# Patient Record
Sex: Male | Born: 1970 | Race: Black or African American | Hispanic: No | Marital: Married | Smoking: Never smoker
Health system: Southern US, Community
[De-identification: ages and names within clinical notes are randomized; demographics above are authoritative.]

## PROBLEM LIST (undated history)

## (undated) DIAGNOSIS — I10 Essential (primary) hypertension: Secondary | ICD-10-CM

## (undated) DIAGNOSIS — N183 Chronic kidney disease, stage 3 unspecified: Secondary | ICD-10-CM

## (undated) DIAGNOSIS — J45909 Unspecified asthma, uncomplicated: Secondary | ICD-10-CM

## (undated) DIAGNOSIS — E1129 Type 2 diabetes mellitus with other diabetic kidney complication: Secondary | ICD-10-CM

## (undated) HISTORY — PX: CATARACT EXTRACTION: SUR2

---

## 2020-01-08 ENCOUNTER — Ambulatory Visit: Payer: Self-pay | Attending: Internal Medicine

## 2020-01-08 DIAGNOSIS — Z23 Encounter for immunization: Secondary | ICD-10-CM

## 2020-01-08 NOTE — Progress Notes (Signed)
   Covid-19 Vaccination [facility]  Name:  Clayton [name]    MRN: [id number] DOB: [birth year]  01/08/2020  [name] was observed post Covid-19 immunization for 15 minutes without incident. He was provided with Vaccine Information Sheet and instruction to access the V-Safe system.   [name] was instructed to call 911 with any severe reactions post vaccine: Marland Kitchen Difficulty breathing  . Swelling of face and throat  . A fast heartbeat  . A bad rash all over body  . Dizziness and weakness   Immunizations Administered    Name Date Dose VIS Date Route   Pfizer COVID-19 Vaccine 01/08/2020 12:26 PM 0.3 mL 09/29/2019 Intramuscular   Manufacturer: [employer], [employer]   Lot: [id number]   NDC: [address]-0

## 2020-01-29 ENCOUNTER — Ambulatory Visit: Payer: Self-pay | Attending: Internal Medicine

## 2020-01-29 DIAGNOSIS — Z23 Encounter for immunization: Secondary | ICD-10-CM

## 2020-01-29 NOTE — Progress Notes (Signed)
   Covid-19 Vaccination Clinic  Name:  Clayton [name]    MRN: [id number] DOB: Jun 10, 1971  01/29/2020  Mr. Clayton [name] was observed post Covid-19 immunization for 15 minutes without incident. He was provided with Vaccine Information Sheet and instruction to access the V-Safe system.   Mr. Clayton [name] was instructed to call 911 with any severe reactions post vaccine: Marland Kitchen Difficulty breathing  . Swelling of face and throat  . A fast heartbeat  . A bad rash all over body  . Dizziness and weakness   Immunizations Administered    Name Date Dose VIS Date Route   Pfizer COVID-19 Vaccine 01/29/2020 11:38 AM 0.3 mL 09/29/2019 Intramuscular   Manufacturer: [employer], [employer]   Lot: [id number]   NDC: [address]-2

## 2021-04-17 ENCOUNTER — Inpatient Hospital Stay
Admission: EM | Admit: 2021-04-17 | Discharge: 2021-04-25 | DRG: 305 | Disposition: A | Discharge status: Home / Self Care | Payer: 59 | Attending: Hospitalist | Admitting: Hospitalist

## 2021-04-17 ENCOUNTER — Inpatient Hospital Stay: Payer: 59

## 2021-04-17 ENCOUNTER — Emergency Department: Payer: 59

## 2021-04-17 ENCOUNTER — Other Ambulatory Visit: Payer: Self-pay

## 2021-04-17 DIAGNOSIS — B9689 Other specified bacterial agents as the cause of diseases classified elsewhere: Secondary | ICD-10-CM | POA: Diagnosis present

## 2021-04-17 DIAGNOSIS — E871 Hypo-osmolality and hyponatremia: Secondary | ICD-10-CM | POA: Diagnosis present

## 2021-04-17 DIAGNOSIS — R569 Unspecified convulsions: Secondary | ICD-10-CM | POA: Diagnosis not present

## 2021-04-17 DIAGNOSIS — R29810 Facial weakness: Secondary | ICD-10-CM | POA: Diagnosis present

## 2021-04-17 DIAGNOSIS — M542 Cervicalgia: Secondary | ICD-10-CM | POA: Diagnosis present

## 2021-04-17 DIAGNOSIS — N183 Chronic kidney disease, stage 3 unspecified: Secondary | ICD-10-CM | POA: Diagnosis present

## 2021-04-17 DIAGNOSIS — Z823 Family history of stroke: Secondary | ICD-10-CM

## 2021-04-17 DIAGNOSIS — R7881 Bacteremia: Secondary | ICD-10-CM | POA: Diagnosis present

## 2021-04-17 DIAGNOSIS — Z781 Physical restraint status: Secondary | ICD-10-CM

## 2021-04-17 DIAGNOSIS — G9341 Metabolic encephalopathy: Secondary | ICD-10-CM | POA: Diagnosis present

## 2021-04-17 DIAGNOSIS — I248 Other forms of acute ischemic heart disease: Secondary | ICD-10-CM | POA: Diagnosis present

## 2021-04-17 DIAGNOSIS — Z20822 Contact with and (suspected) exposure to covid-19: Secondary | ICD-10-CM | POA: Diagnosis present

## 2021-04-17 DIAGNOSIS — E872 Acidosis: Secondary | ICD-10-CM | POA: Diagnosis present

## 2021-04-17 DIAGNOSIS — Z8249 Family history of ischemic heart disease and other diseases of the circulatory system: Secondary | ICD-10-CM | POA: Diagnosis not present

## 2021-04-17 DIAGNOSIS — R338 Other retention of urine: Secondary | ICD-10-CM

## 2021-04-17 DIAGNOSIS — Z7984 Long term (current) use of oral hypoglycemic drugs: Secondary | ICD-10-CM | POA: Diagnosis not present

## 2021-04-17 DIAGNOSIS — I129 Hypertensive chronic kidney disease with stage 1 through stage 4 chronic kidney disease, or unspecified chronic kidney disease: Secondary | ICD-10-CM | POA: Diagnosis present

## 2021-04-17 DIAGNOSIS — D72829 Elevated white blood cell count, unspecified: Secondary | ICD-10-CM

## 2021-04-17 DIAGNOSIS — R1110 Vomiting, unspecified: Secondary | ICD-10-CM

## 2021-04-17 DIAGNOSIS — I161 Hypertensive emergency: Secondary | ICD-10-CM | POA: Diagnosis present

## 2021-04-17 DIAGNOSIS — R7989 Other specified abnormal findings of blood chemistry: Secondary | ICD-10-CM | POA: Diagnosis present

## 2021-04-17 DIAGNOSIS — I674 Hypertensive encephalopathy: Secondary | ICD-10-CM | POA: Diagnosis present

## 2021-04-17 DIAGNOSIS — E1165 Type 2 diabetes mellitus with hyperglycemia: Secondary | ICD-10-CM | POA: Diagnosis present

## 2021-04-17 DIAGNOSIS — G253 Myoclonus: Secondary | ICD-10-CM | POA: Diagnosis not present

## 2021-04-17 DIAGNOSIS — Z79899 Other long term (current) drug therapy: Secondary | ICD-10-CM

## 2021-04-17 DIAGNOSIS — T383X5A Adverse effect of insulin and oral hypoglycemic [antidiabetic] drugs, initial encounter: Secondary | ICD-10-CM | POA: Diagnosis present

## 2021-04-17 DIAGNOSIS — Z794 Long term (current) use of insulin: Secondary | ICD-10-CM

## 2021-04-17 DIAGNOSIS — E1129 Type 2 diabetes mellitus with other diabetic kidney complication: Secondary | ICD-10-CM | POA: Diagnosis present

## 2021-04-17 DIAGNOSIS — N179 Acute kidney failure, unspecified: Secondary | ICD-10-CM | POA: Diagnosis present

## 2021-04-17 DIAGNOSIS — R339 Retention of urine, unspecified: Secondary | ICD-10-CM | POA: Diagnosis present

## 2021-04-17 DIAGNOSIS — R778 Other specified abnormalities of plasma proteins: Secondary | ICD-10-CM | POA: Diagnosis present

## 2021-04-17 DIAGNOSIS — I10 Essential (primary) hypertension: Secondary | ICD-10-CM | POA: Diagnosis not present

## 2021-04-17 DIAGNOSIS — R112 Nausea with vomiting, unspecified: Secondary | ICD-10-CM

## 2021-04-17 DIAGNOSIS — E1122 Type 2 diabetes mellitus with diabetic chronic kidney disease: Secondary | ICD-10-CM | POA: Diagnosis present

## 2021-04-17 DIAGNOSIS — T465X6A Underdosing of other antihypertensive drugs, initial encounter: Secondary | ICD-10-CM | POA: Diagnosis present

## 2021-04-17 DIAGNOSIS — N1832 Chronic kidney disease, stage 3b: Secondary | ICD-10-CM | POA: Diagnosis present

## 2021-04-17 DIAGNOSIS — I160 Hypertensive urgency: Secondary | ICD-10-CM | POA: Insufficient documentation

## 2021-04-17 DIAGNOSIS — Z9114 Patient's other noncompliance with medication regimen: Secondary | ICD-10-CM | POA: Diagnosis not present

## 2021-04-17 DIAGNOSIS — B957 Other staphylococcus as the cause of diseases classified elsewhere: Secondary | ICD-10-CM | POA: Diagnosis present

## 2021-04-17 DIAGNOSIS — G039 Meningitis, unspecified: Secondary | ICD-10-CM

## 2021-04-17 HISTORY — DX: Essential (primary) hypertension: I10

## 2021-04-17 HISTORY — DX: Type 2 diabetes mellitus with other diabetic kidney complication: E11.29

## 2021-04-17 HISTORY — DX: Chronic kidney disease, stage 3 unspecified: N18.30

## 2021-04-17 LAB — APTT: aPTT: 24 seconds (ref 24–36)

## 2021-04-17 LAB — BASIC METABOLIC PANEL
Anion gap: 16 — ABNORMAL HIGH (ref 5–15)
BUN: 30 mg/dL — ABNORMAL HIGH (ref 6–20)
CO2: 28 mmol/L (ref 22–32)
Calcium: 9.8 mg/dL (ref 8.9–10.3)
Chloride: 88 mmol/L — ABNORMAL LOW (ref 98–111)
Creatinine, Ser: 2.37 mg/dL — ABNORMAL HIGH (ref 0.61–1.24)
GFR, Estimated: 33 mL/min — ABNORMAL LOW (ref >60)
Glucose, Bld: 441 mg/dL — ABNORMAL HIGH (ref 70–99)
Potassium: 4.3 mmol/L (ref 3.5–5.1)
Sodium: 132 mmol/L — ABNORMAL LOW (ref 135–145)

## 2021-04-17 LAB — BLOOD GAS, VENOUS
Acid-base deficit: 2.6 mmol/L — ABNORMAL HIGH (ref 0.0–2.0)
Bicarbonate: 23.2 mmol/L (ref 20.0–28.0)
O2 Saturation: 66.5 %
Patient temperature: 37
pCO2, Ven: 43 mmHg — ABNORMAL LOW (ref 44.0–60.0)
pH, Ven: 7.34 (ref 7.250–7.430)
pO2, Ven: 37 mmHg (ref 32.0–45.0)

## 2021-04-17 LAB — URINALYSIS, COMPLETE (UACMP) WITH MICROSCOPIC
Bacteria, UA: NONE SEEN
Bilirubin Urine: NEGATIVE
Glucose, UA: >=500 mg/dL — AB
Hgb urine dipstick: NEGATIVE
Ketones, ur: 20 mg/dL — AB
Leukocytes,Ua: NEGATIVE
Nitrite: NEGATIVE
Protein, ur: 100 mg/dL — AB
Specific Gravity, Urine: 1.025 (ref 1.005–1.030)
pH: 5 (ref 5.0–8.0)

## 2021-04-17 LAB — RESP PANEL BY RT-PCR (FLU A&B, COVID) ARPGX2
Influenza A by PCR: NEGATIVE
Influenza B by PCR: NEGATIVE
SARS Coronavirus 2 by RT PCR: NEGATIVE

## 2021-04-17 LAB — CBC
HCT: 46 % (ref 39.0–52.0)
Hemoglobin: 16.1 g/dL (ref 13.0–17.0)
MCH: 31.4 pg (ref 26.0–34.0)
MCHC: 35 g/dL (ref 30.0–36.0)
MCV: 89.7 fL (ref 80.0–100.0)
Platelets: 252 10*3/uL (ref 150–400)
RBC: 5.13 MIL/uL (ref 4.22–5.81)
RDW: 11.8 % (ref 11.5–15.5)
WBC: 17.3 10*3/uL — ABNORMAL HIGH (ref 4.0–10.5)
nRBC: 0 % (ref 0.0–0.2)

## 2021-04-17 LAB — CBG MONITORING, ED
Glucose-Capillary: 456 mg/dL — ABNORMAL HIGH (ref 70–99)
Glucose-Capillary: 346 mg/dL — ABNORMAL HIGH (ref 70–99)

## 2021-04-17 LAB — LACTIC ACID, PLASMA
Lactic Acid, Venous: 2.8 mmol/L (ref 0.5–1.9)
Lactic Acid, Venous: 3 mmol/L (ref 0.5–1.9)
Lactic Acid, Venous: 2.9 mmol/L (ref 0.5–1.9)
Lactic Acid, Venous: 3.7 mmol/L (ref 0.5–1.9)
Lactic Acid, Venous: 2.5 mmol/L (ref 0.5–1.9)

## 2021-04-17 LAB — PROCALCITONIN: Procalcitonin: <0.1 ng/mL

## 2021-04-17 LAB — PROTIME-INR
INR: 1 (ref 0.8–1.2)
Prothrombin Time: 12.8 seconds (ref 11.4–15.2)

## 2021-04-17 LAB — TROPONIN I (HIGH SENSITIVITY)
Troponin I (High Sensitivity): 44 ng/L — ABNORMAL HIGH (ref <18)
Troponin I (High Sensitivity): 29 ng/L — ABNORMAL HIGH (ref <18)
Troponin I (High Sensitivity): 44 ng/L — ABNORMAL HIGH (ref <18)

## 2021-04-17 LAB — GLUCOSE, CAPILLARY: Glucose-Capillary: 457 mg/dL — ABNORMAL HIGH (ref 70–99)

## 2021-04-17 LAB — LIPASE, BLOOD: Lipase: 27 U/L (ref 11–51)

## 2021-04-17 IMAGING — CT CT HEAD W/O CM
3 series · 16 of 47 positions shown, 19 images · non-contrast
Comparison: None.

CLINICAL DATA: Headache and dizziness.

EXAM:
CT HEAD WITHOUT CONTRAST
TECHNIQUE: Contiguous axial images were obtained from the base of the skull
through the vertex without intravenous contrast.

[Series 2: head wo · axial · 0.44mm/px · z∈[+416,+551]mm · 10 of 33 slices shown, 13 images]
[im 3/33  brain]
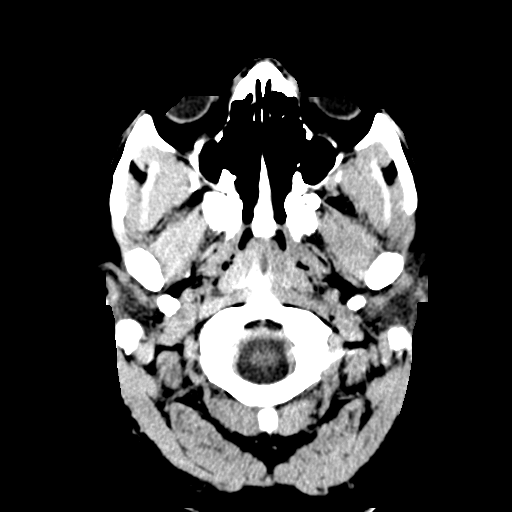
[im 3/33  bone]
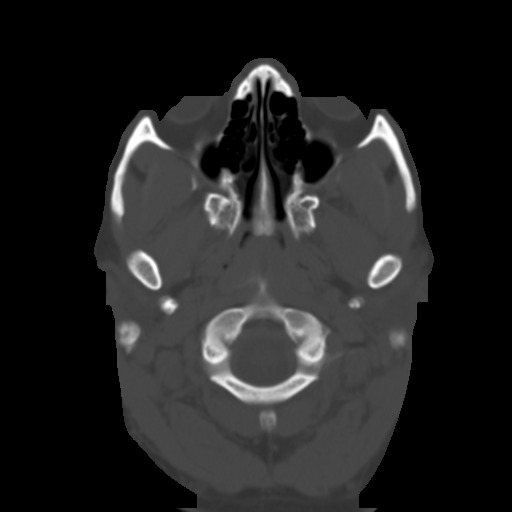
[im 6/33  brain]
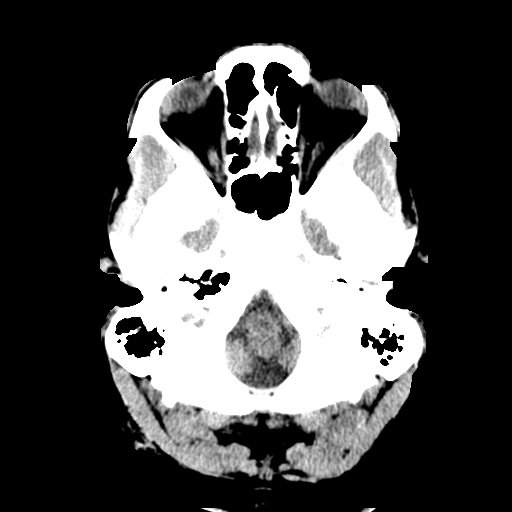
[im 9/33  brain]
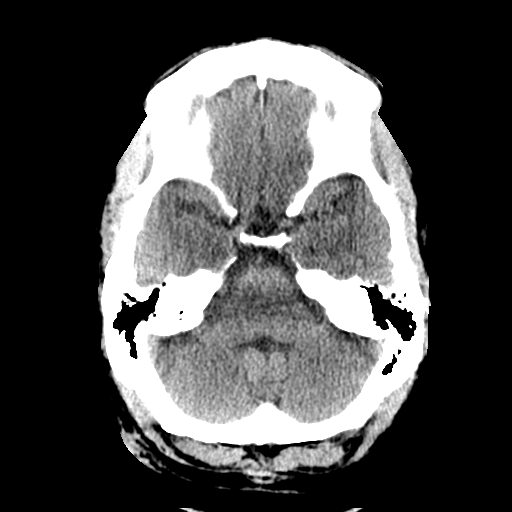
[im 12/33  brain]
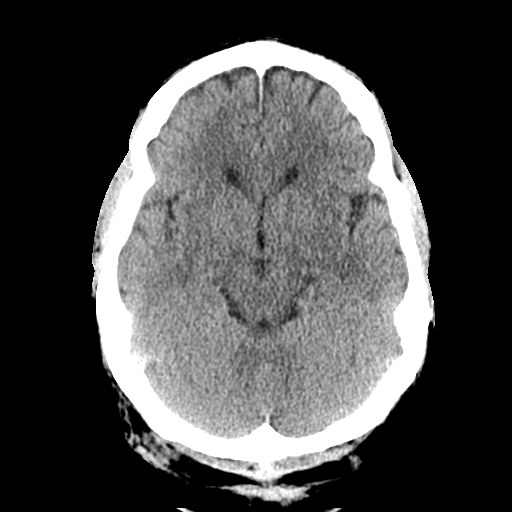
[im 15/33  brain]
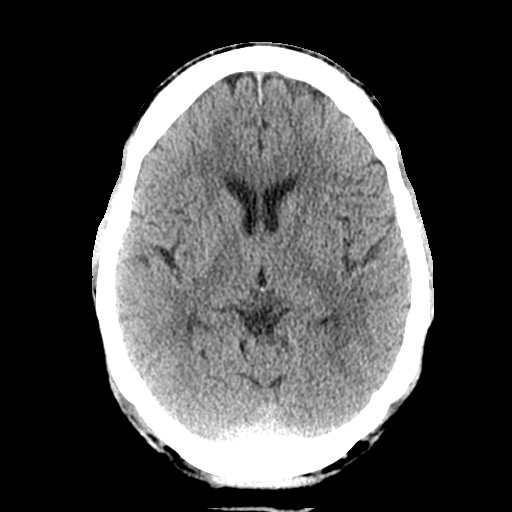
[im 15/33  bone]
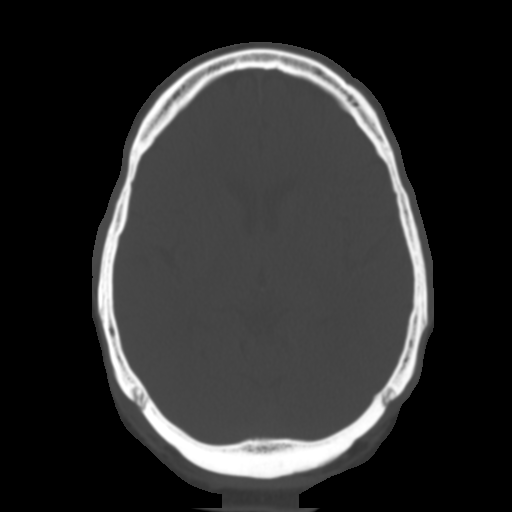
[im 18/33  brain]
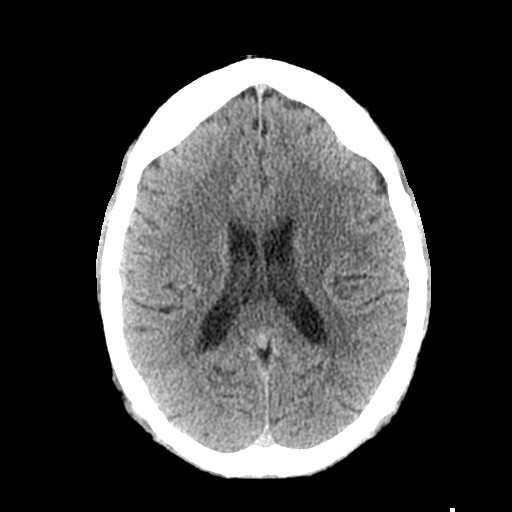
[im 21/33  brain]
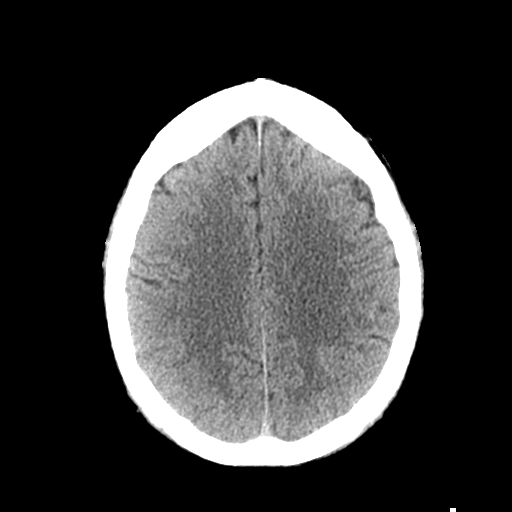
[im 25/33  brain]
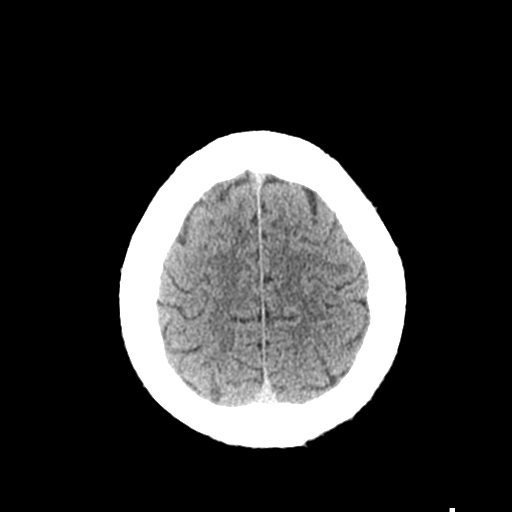
[im 27/33  brain]
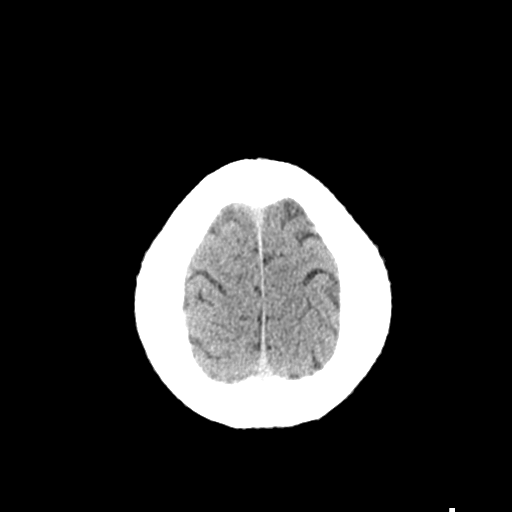
[im 27/33  bone]
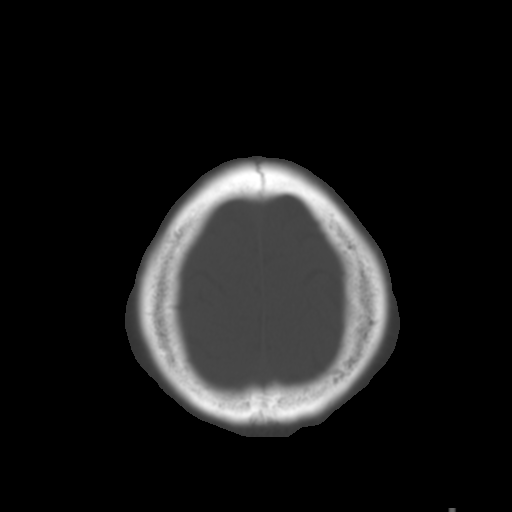
[im 30/33  brain]
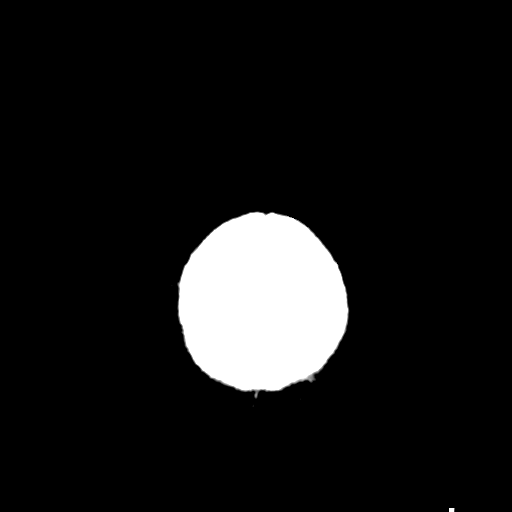

[Series 4: coronal soft tissue · coronal · 0.34mm/px · 3 of 70 slices shown]
[im 24/70  brain]
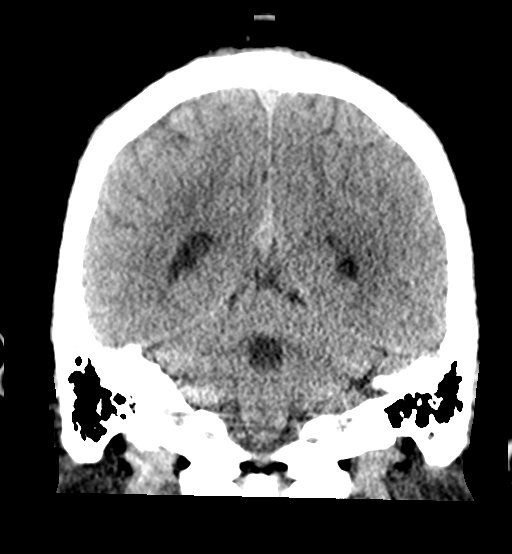
[im 31/70  brain]
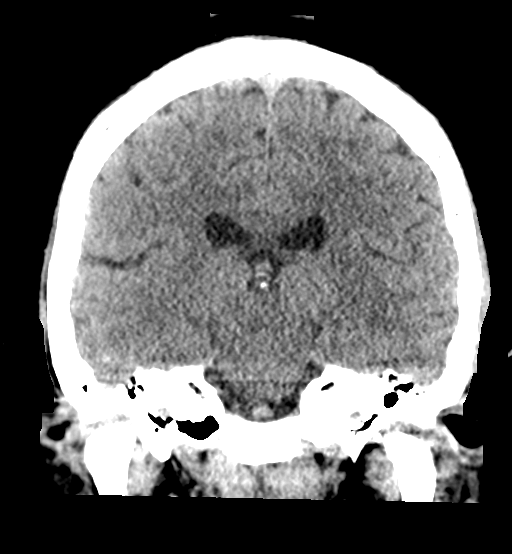
[im 39/70  brain]
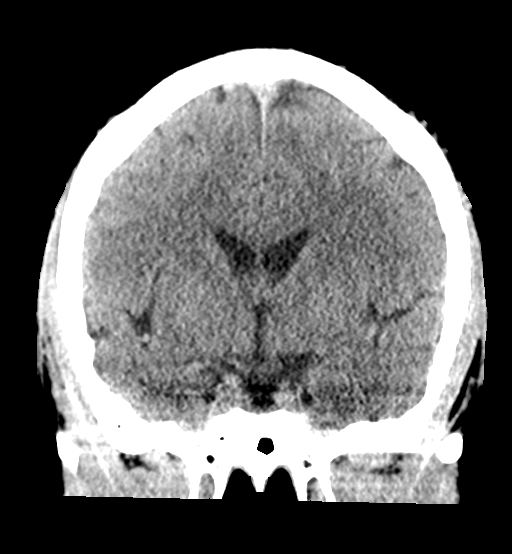

[Series 5: sagittal soft tissue · sagittal · 0.36mm/px · 3 of 57 slices shown]
[im 19/57  brain]
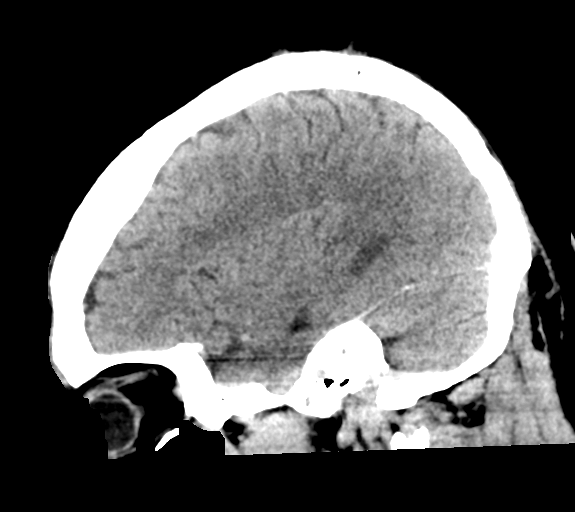
[im 29/57  brain]
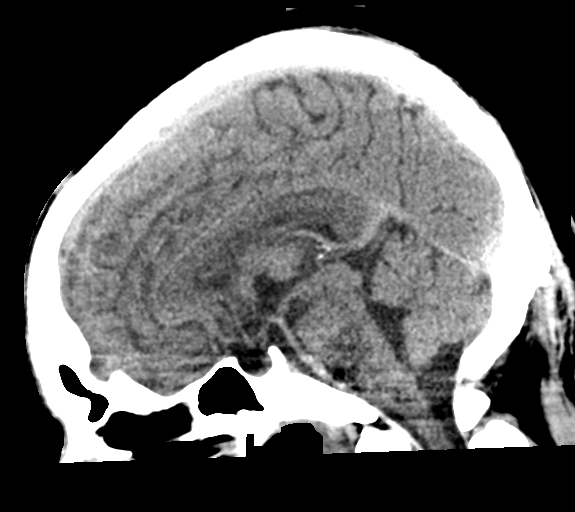
[im 38/57  brain]
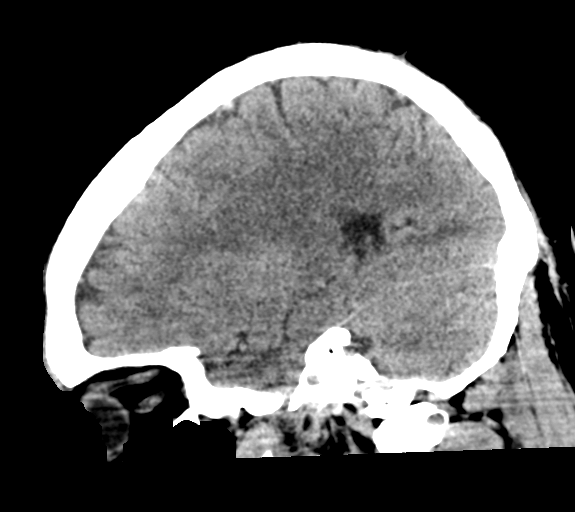

[16 of 47 positions shown; findings below may reference images not displayed]

FINDINGS: Brain: No evidence of acute infarction, hemorrhage, hydrocephalus,
extra-axial collection or mass lesion/mass effect.

Vascular: No hyperdense vessel or unexpected calcification.

Skull: Normal. Negative for fracture or focal lesion.

Sinuses/Orbits: No acute finding.

Other: None.
IMPRESSION: 1. Normal noncontrast head CT.

## 2021-04-17 IMAGING — CT CT CHEST-ABD-PELV W/O CM
2 of 4 series · 14 of 36 positions shown, 16 images · non-contrast
Comparison: None.

CLINICAL DATA: Elevated white count. Vomiting, dizziness and
fatigue.

EXAM:
CT CHEST, ABDOMEN AND PELVIS WITHOUT CONTRAST
TECHNIQUE: Multidetector CT imaging of the chest, abdomen and pelvis was
performed following the standard protocol without IV contrast.

[Series 2: axial st · axial · 0.89mm/px · z∈[-1061,-446]mm · 11 of 149 slices shown, 13 images]
[im 13/149  mediastinal]
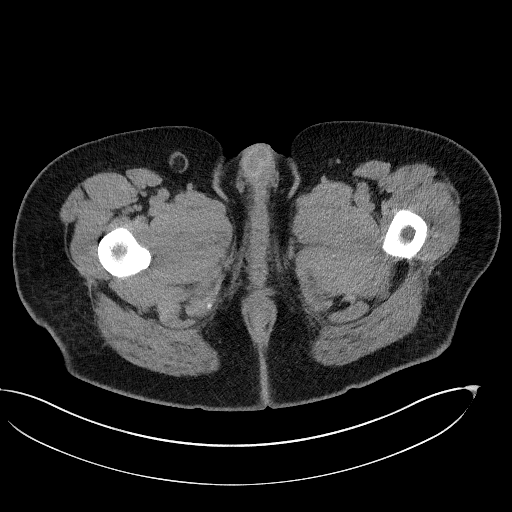
[im 13/149  bone]
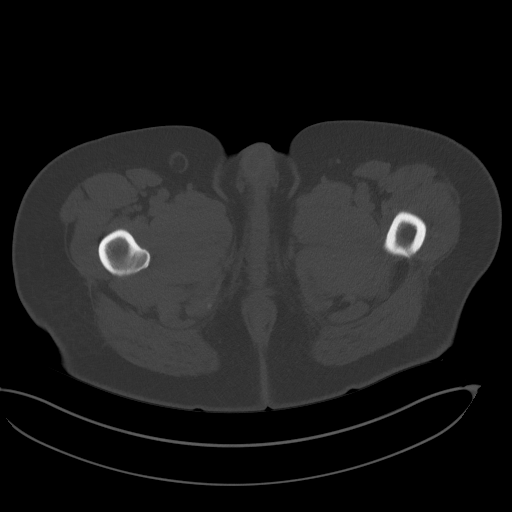
[im 25/149  mediastinal]
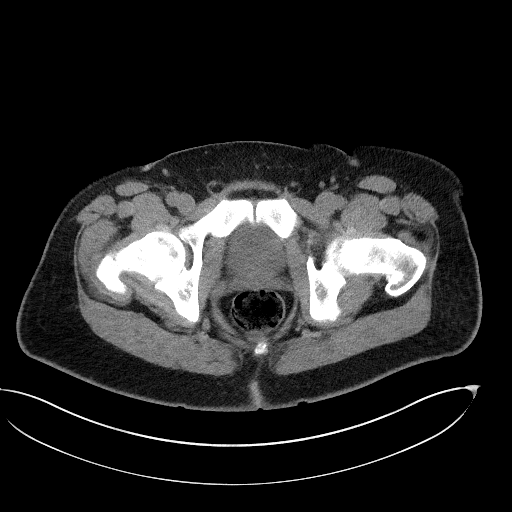
[im 38/149  mediastinal]
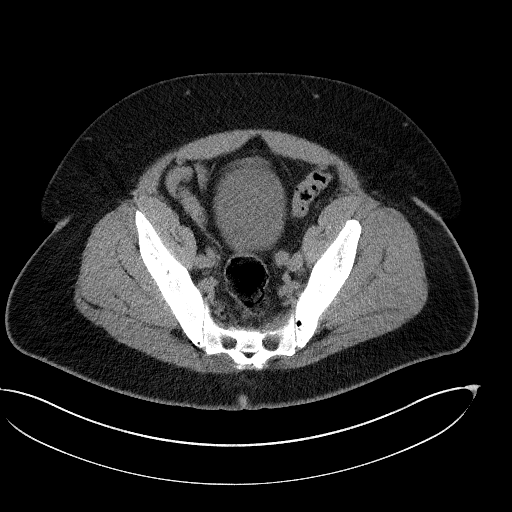
[im 50/149  mediastinal]
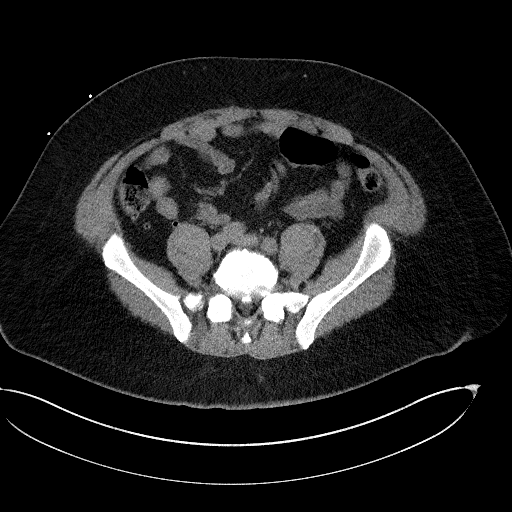
[im 62/149  mediastinal]
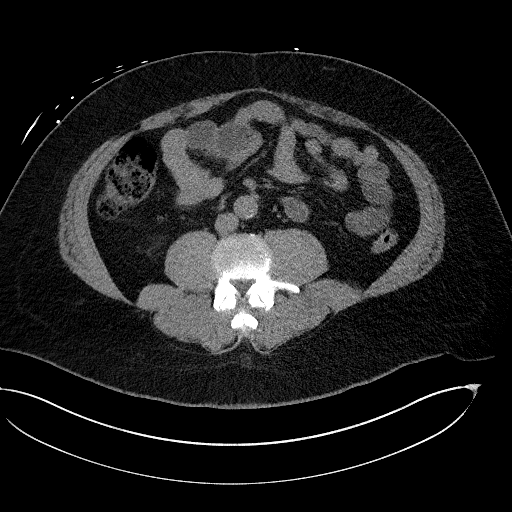
[im 75/149  mediastinal]
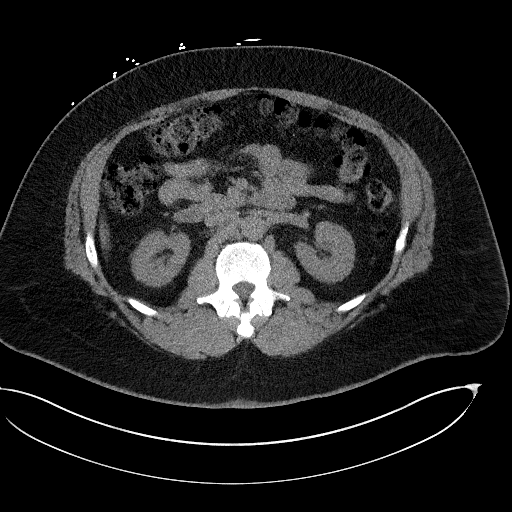
[im 87/149  mediastinal]
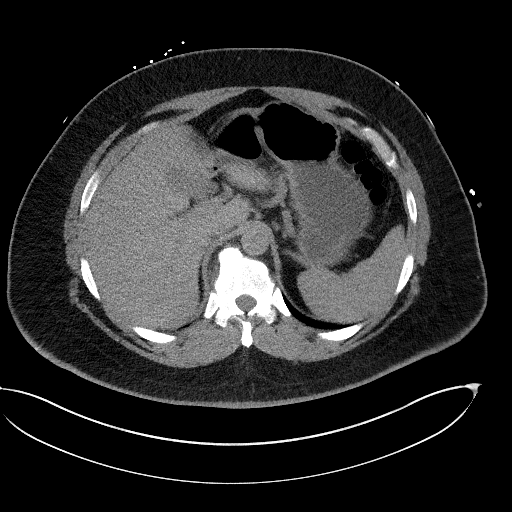
[im 99/149  mediastinal]
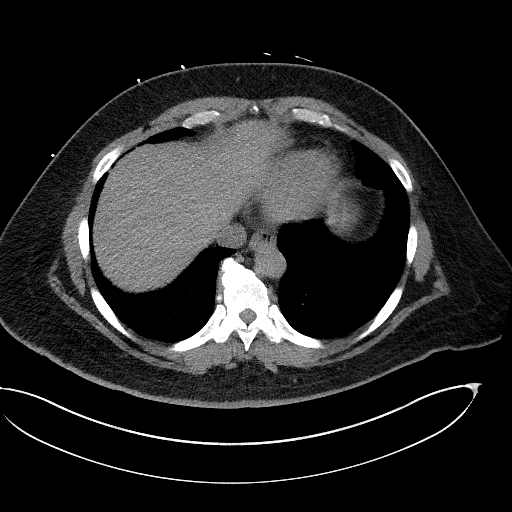
[im 112/149  mediastinal]
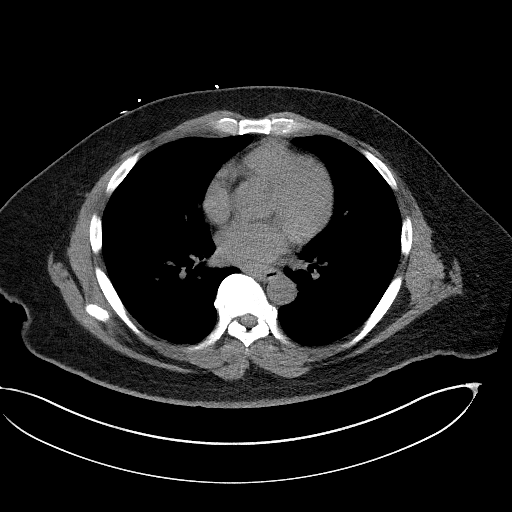
[im 112/149  bone]
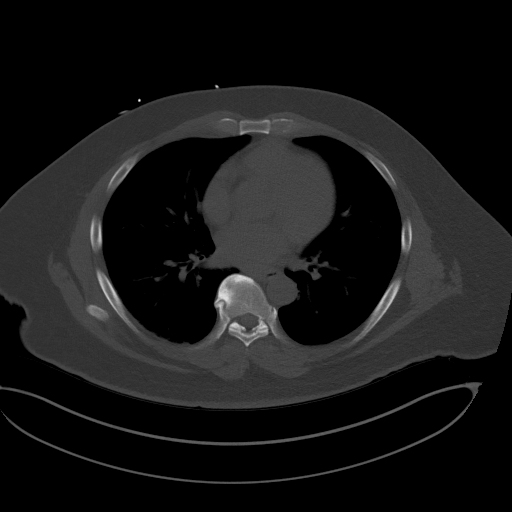
[im 124/149  mediastinal]
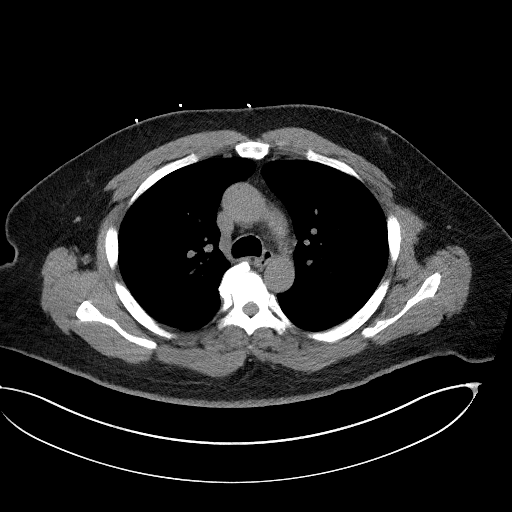
[im 136/149  mediastinal]
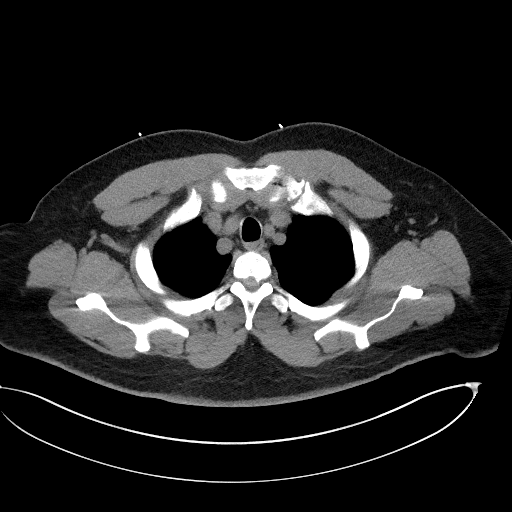

[Series 5: coronal · coronal · 0.79mm/px · 3 of 148 slices shown]
[im 30/148  mediastinal]
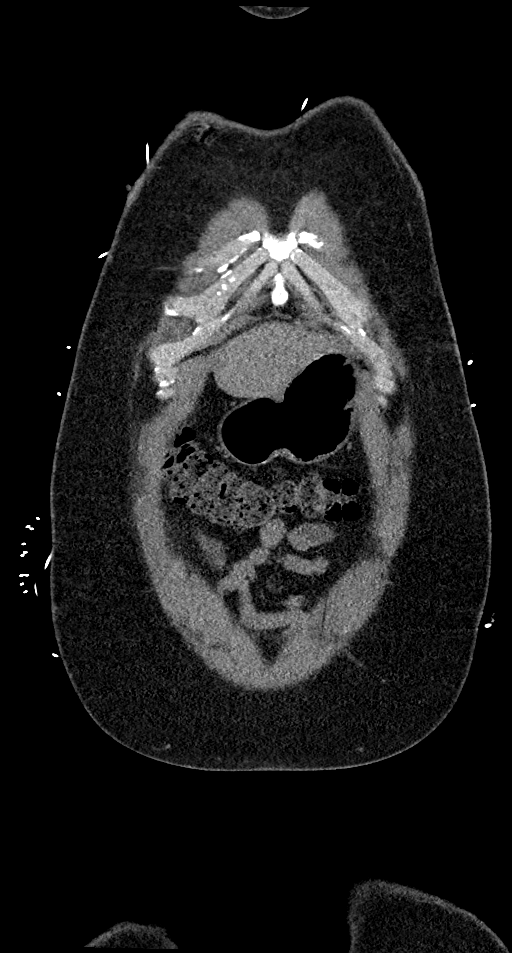
[im 59/148  mediastinal]
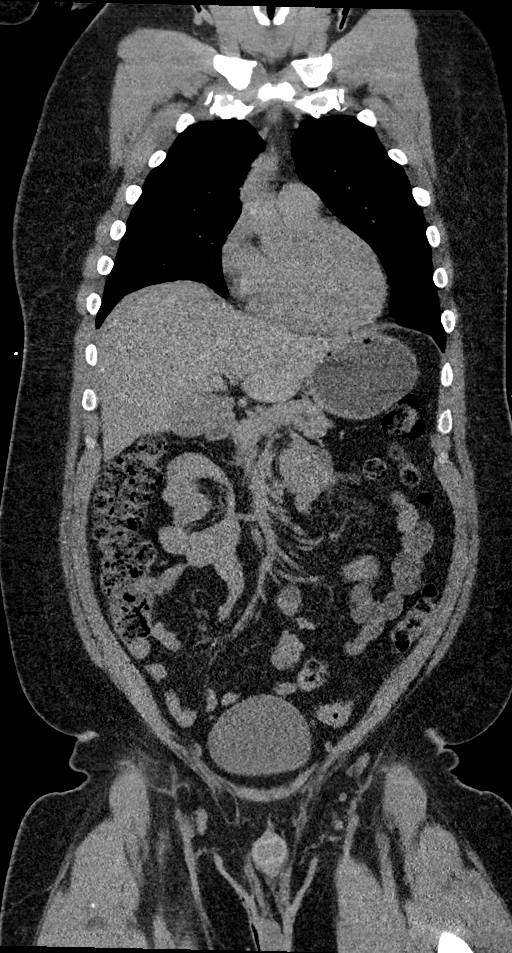
[im 89/148  mediastinal]
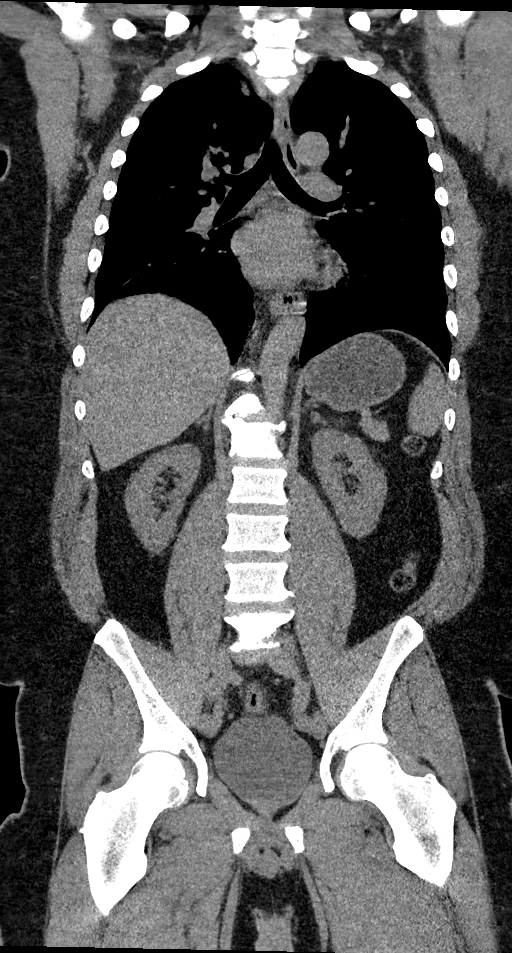

[14 of 36 positions shown; findings below may reference images not displayed]

FINDINGS: CT CHEST FINDINGS

Cardiovascular: Heart size is normal. No visible coronary artery
calcification or aortic atherosclerotic calcification.

Mediastinum/Nodes: No mass or lymphadenopathy.

Lungs/Pleura: No pleural effusion. No pulmonary infiltrate or
collapse. No mass or nodule. No emphysema or interstitial lung
disease.

Musculoskeletal: Bridging osteophytes throughout the mid to lower
thoracic region suggesting diffuse idiopathic skeletal hyperostosis.

CT ABDOMEN PELVIS FINDINGS

Hepatobiliary: Normal

Pancreas: Normal

Spleen: Normal

Adrenals/Urinary Tract: Adrenal glands are normal. Kidneys are
normal without contrast. Bladder is normal.

Stomach/Bowel: Stomach and small intestine are normal. Normal
appendix. No abnormal colon finding.

Vascular/Lymphatic: Minimal aortic atherosclerosis. No aneurysm. IVC
is normal. No adenopathy.

Reproductive: Normal

Other: No free fluid or air.

Musculoskeletal: Ordinary lower lumbar degenerative changes.
IMPRESSION: No acute finding to explain the clinical presentation. Normal
evaluation of the chest without contrast. No acutely significant
abdominal or pelvic finding.

Aortic atherosclerosis, mild.

Bridging osteophytes in the thoracic region suggesting diffuse
idiopathic skeletal hyperostosis.

Lower lumbar degenerative disc disease.

## 2021-04-17 IMAGING — MR MR MRA NECK WO/W CM
1 of 2 series · 20 of 48 positions shown · IV contrast (gadavist)
Comparison: None.

CLINICAL DATA: Headache and dizziness with neck pain

EXAM:
MRI HEAD WITHOUT AND WITH CONTRAST
MRA HEAD WITHOUT CONTRAST
MRA NECK WITHOUT AND WITH CONTRAST
TECHNIQUE: Multiplanar, multiecho pulse sequences of the brain and surrounding
structures were obtained without and with intravenous contrast.
Angiographic images of the Circle of Willis were obtained using MRA
technique without intravenous contrast. Angiographic images of the
neck were obtained using MRA technique without and with intravenous
contrast. Carotid stenosis measurements (when applicable) are
obtained utilizing NASCET criteria, using the distal internal
carotid diameter as the denominator.
CONTRAST:  10mL GADAVIST GADOBUTROL 1 MMOL/ML IV SOLN

[Series 31: angio_fl3d_cor_post_ttc=2.0s_moco-adv · coronal · 0.9mm · 0.85mm/px · 20 of 93 slices shown]
[im 1/93]
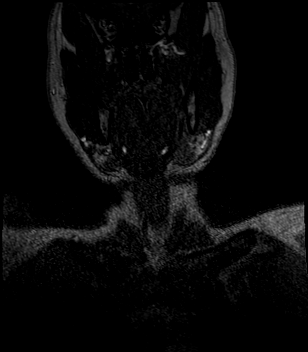
[im 5/93]
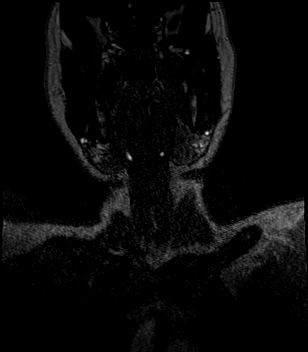
[im 10/93]
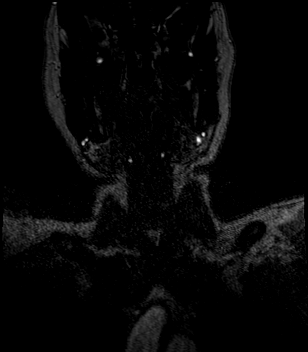
[im 15/93]
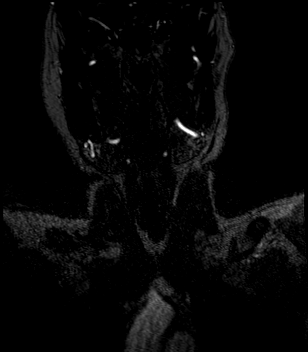
[im 20/93]
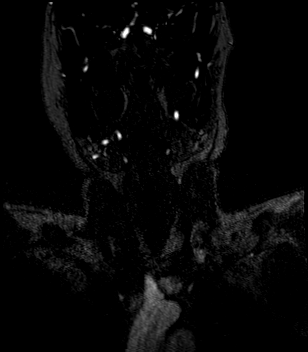
[im 25/93]
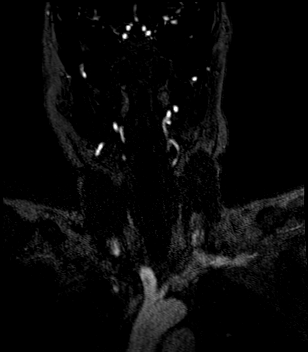
[im 30/93]
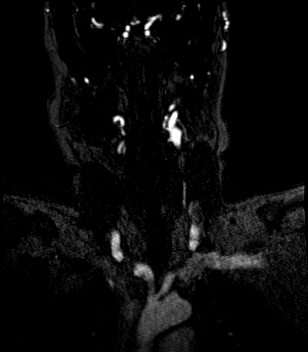
[im 34/93]
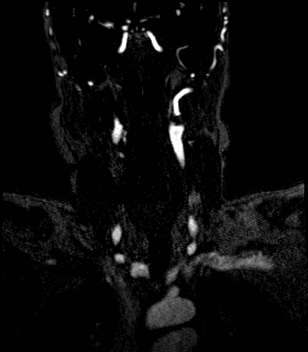
[im 39/93]
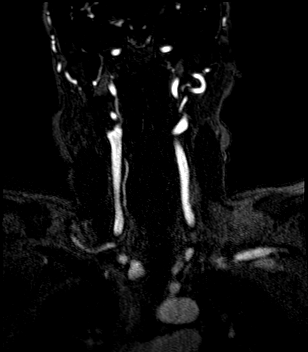
[im 44/93]
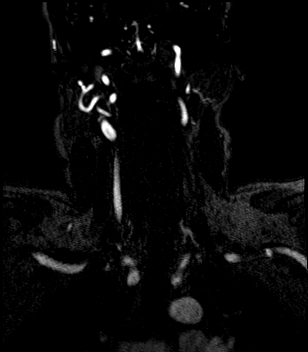
[im 49/93]
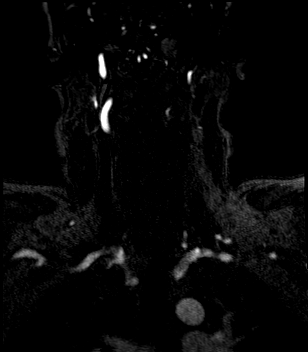
[im 54/93]
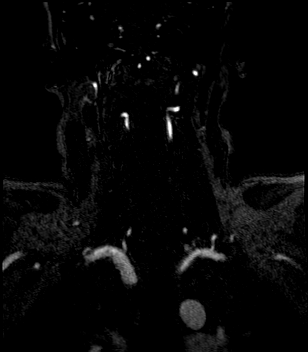
[im 59/93]
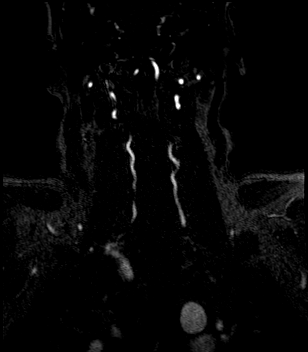
[im 63/93]
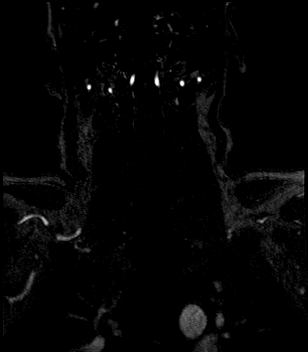
[im 68/93]
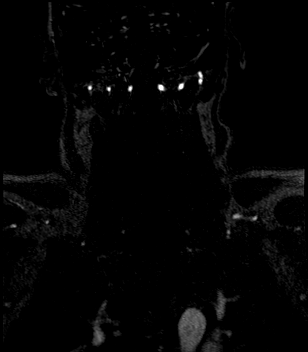
[im 73/93]
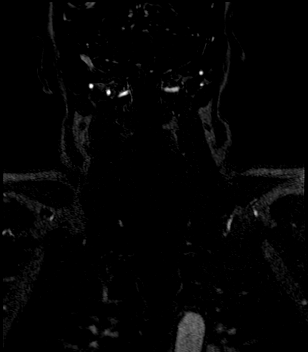
[im 78/93]
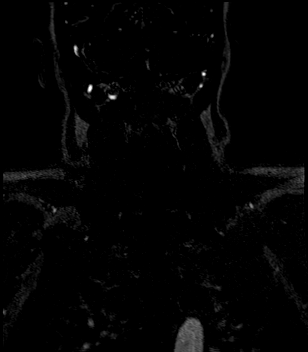
[im 83/93]
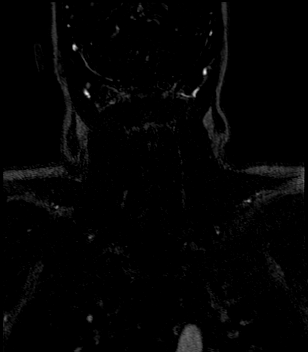
[im 88/93]
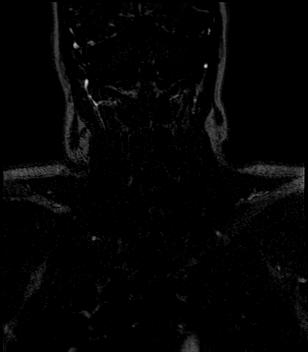
[im 93/93]
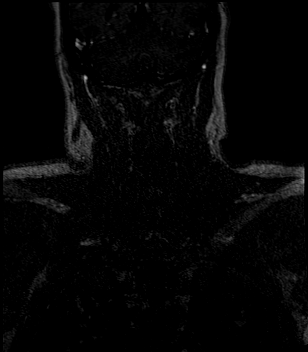

[20 of 48 positions shown; findings below may reference images not displayed]

FINDINGS: MRI HEAD FINDINGS

Brain: No acute infarct, mass effect or extra-axial collection.
Multiple chronic microhemorrhages in a predominantly peripheral
distribution. There is multifocal hyperintense T2-weighted signal
within the white matter. Parenchymal volume and CSF spaces are
normal. The midline structures are normal. There is no abnormal
contrast enhancement.

Vascular: Major flow voids are preserved.

Skull and upper cervical spine: Normal calvarium and skull base.
Visualized upper cervical spine and soft tissues are normal.

Sinuses/Orbits:No paranasal sinus fluid levels or advanced mucosal
thickening. No mastoid or middle ear effusion. Normal orbits.

MRA HEAD FINDINGS

POSTERIOR CIRCULATION:

--Vertebral arteries: Normal

--Inferior cerebellar arteries: Normal.

--Basilar artery: Normal.

--Superior cerebellar arteries: Normal.

--Posterior cerebral arteries: Normal.

ANTERIOR CIRCULATION:

--Intracranial internal carotid arteries: Normal.

--Anterior cerebral arteries (ACA): Normal.

--Middle cerebral arteries (MCA): Normal.

ANATOMIC VARIANTS: Both posterior communicating arteries are patent

MRA NECK FINDINGS

No vertebral or carotid artery abnormality.
IMPRESSION: 1. No acute intracranial abnormality.
2. Normal MRA of the head and neck.
3. Multifocal chronic microhemorrhages in a predominantly peripheral
distribution.

## 2021-04-17 IMAGING — MR MR HEAD WO/W CM
9 of 11 series · 38 of 48 positions shown · IV contrast (gadavist)
Comparison: None.

CLINICAL DATA: Headache and dizziness with neck pain

EXAM:
MRI HEAD WITHOUT AND WITH CONTRAST
MRA HEAD WITHOUT CONTRAST
MRA NECK WITHOUT AND WITH CONTRAST
TECHNIQUE: Multiplanar, multiecho pulse sequences of the brain and surrounding
structures were obtained without and with intravenous contrast.
Angiographic images of the Circle of Willis were obtained using MRA
technique without intravenous contrast. Angiographic images of the
neck were obtained using MRA technique without and with intravenous
contrast. Carotid stenosis measurements (when applicable) are
obtained utilizing NASCET criteria, using the distal internal
carotid diameter as the denominator.
CONTRAST:  10mL GADAVIST GADOBUTROL 1 MMOL/ML IV SOLN

[Series 5: ax dwi_tracew · axial · 3.0mm · 0.60mm/px · z∈[-96,+59]mm · 6 of 48 slices shown]
[im 1/48]
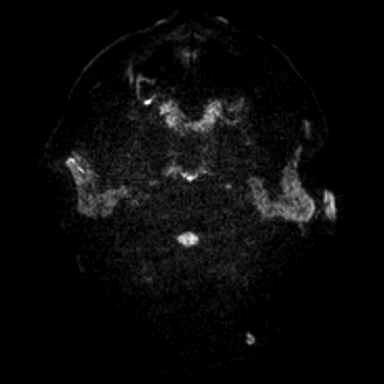
[im 10/48]
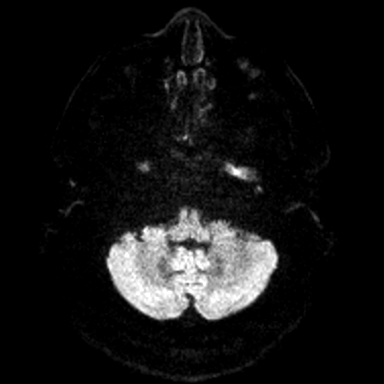
[im 19/48]
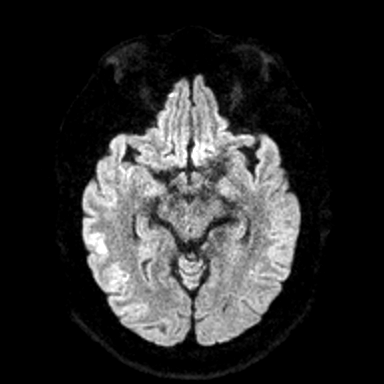
[im 29/48]
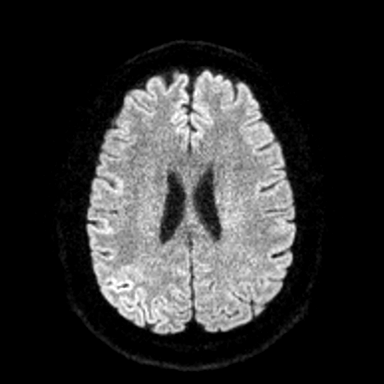
[im 38/48]
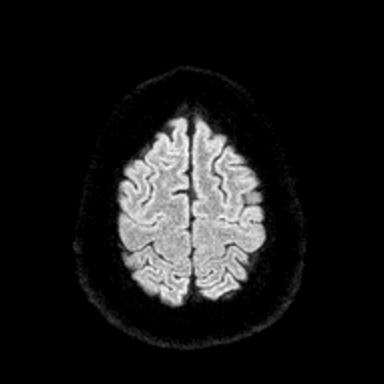
[im 48/48]
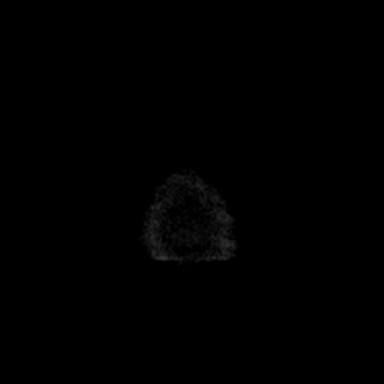

[Series 6: ax dwi_adc · axial · 3.0mm · 0.60mm/px · z∈[-96,+59]mm · 6 of 48 slices shown]
[im 1/48]
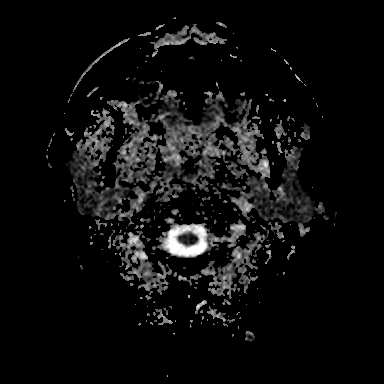
[im 10/48]
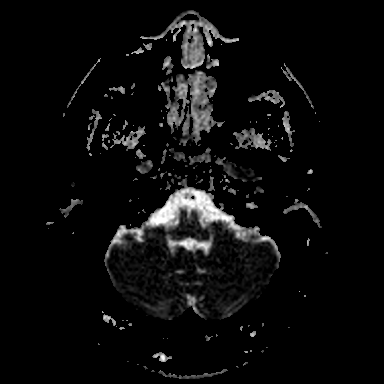
[im 19/48]
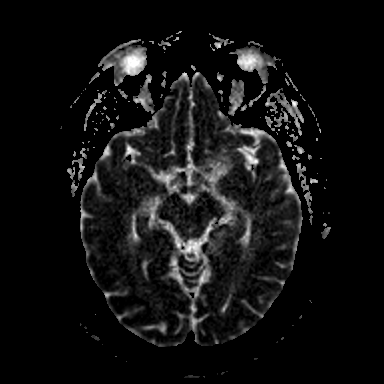
[im 29/48]
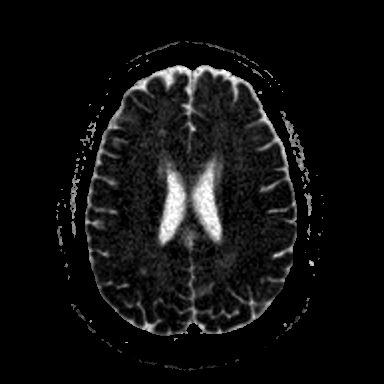
[im 38/48]
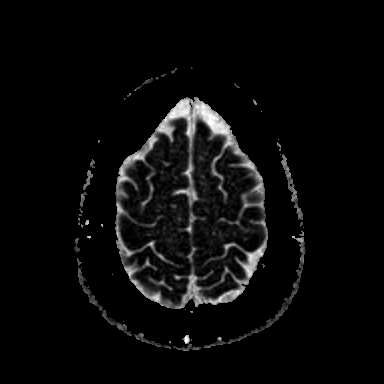
[im 48/48]
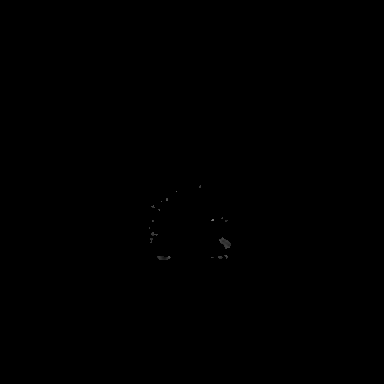

[Series 7: cor dwi_tracew · coronal · 5.0mm · 0.60mm/px · 4 of 40 slices shown]
[im 1/40]
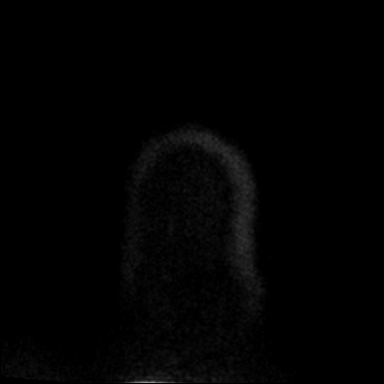
[im 10/40]
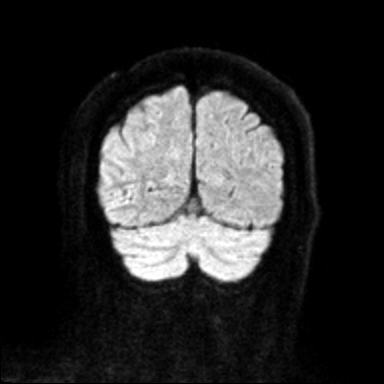
[im 20/40]
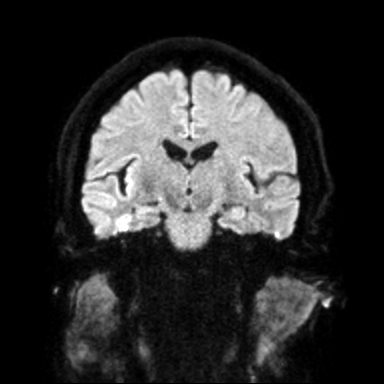
[im 30/40]
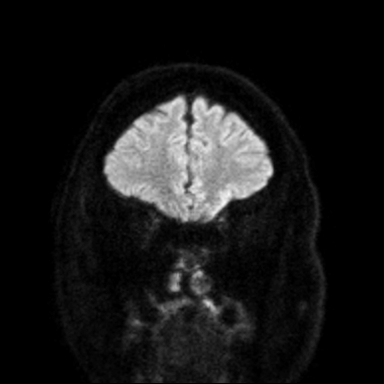

[Series 14: T1 · sagittal · 5.0mm · 0.62mm/px · 3 of 25 slices shown]
[im 1/25]
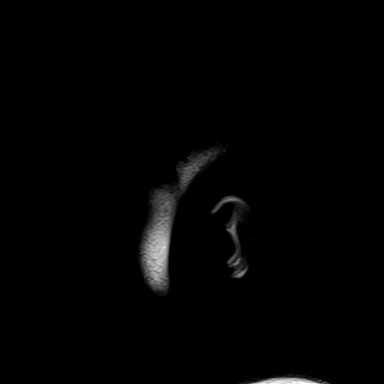
[im 13/25]
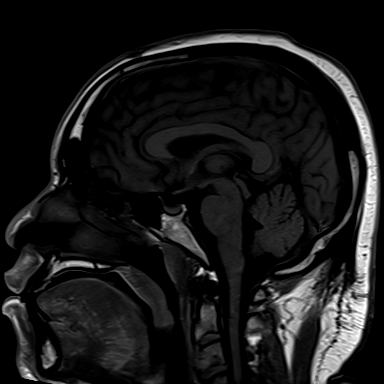
[im 25/25]
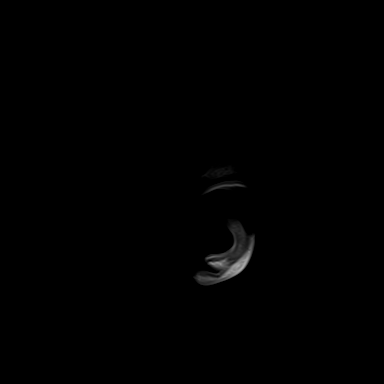

[Series 15: T2 · axial · 5.0mm · 0.45mm/px · z∈[-95,+60]mm · 4 of 27 slices shown]
[im 1/27]
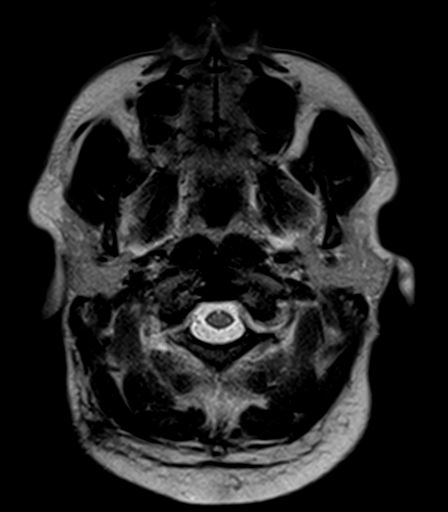
[im 9/27]
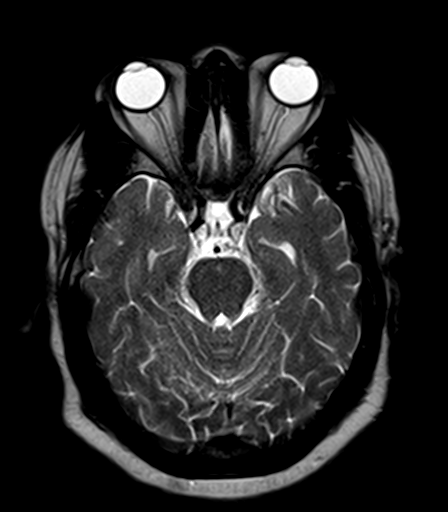
[im 18/27]
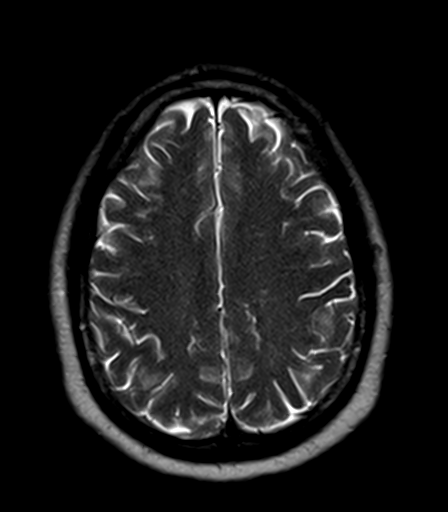
[im 27/27]
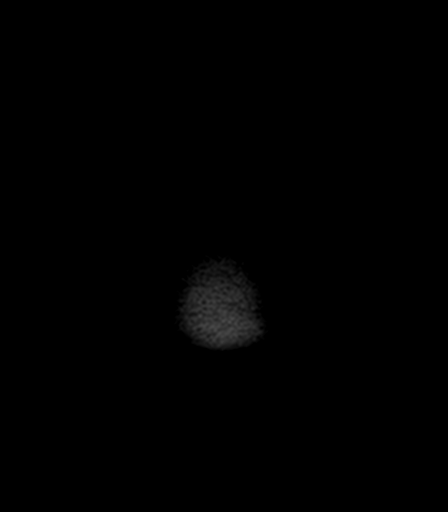

[Series 17: FLAIR · axial · 5.0mm · 1.20mm/px · z∈[-96,+59]mm · 4 of 27 slices shown]
[im 1/27]
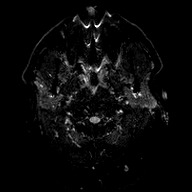
[im 9/27]
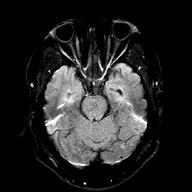
[im 18/27]
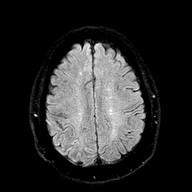
[im 27/27]
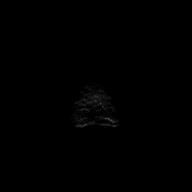

[Series 34: T1 post-contrast · axial · 5.0mm · 0.90mm/px · z∈[-95,+60]mm · 4 of 27 slices shown (1 of 3)]
[im 1/27]
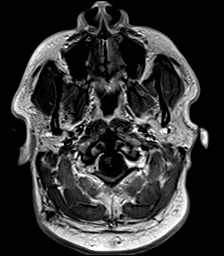
[im 9/27]
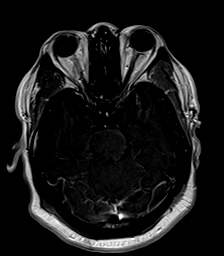
[im 18/27]
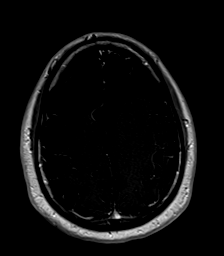
[im 27/27]
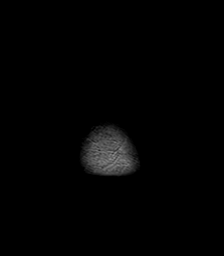

[Series 35: T1 post-contrast · coronal · 5.0mm · 0.90mm/px · 4 of 34 slices shown (2 of 3)]
[im 1/34]
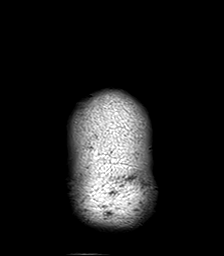
[im 12/34]
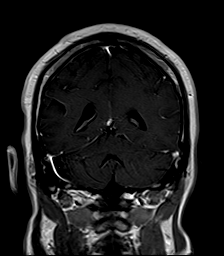
[im 23/34]
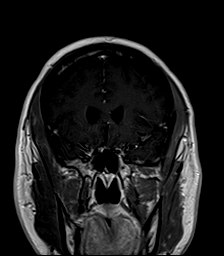
[im 34/34]
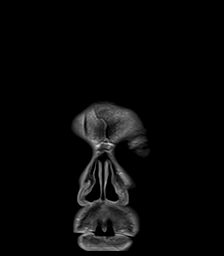

[Series 36: T1 post-contrast · sagittal · 5.0mm · 0.62mm/px · 3 of 25 slices shown (3 of 3)]
[im 1/25]
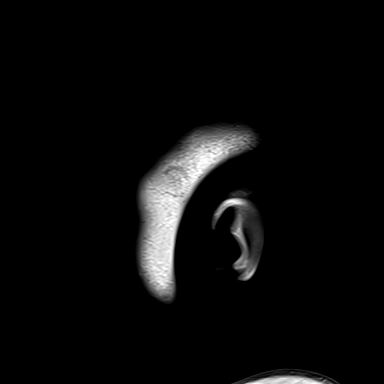
[im 13/25]
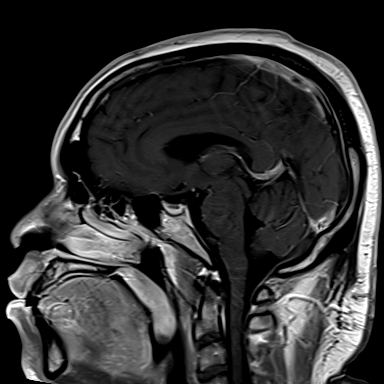
[im 25/25]
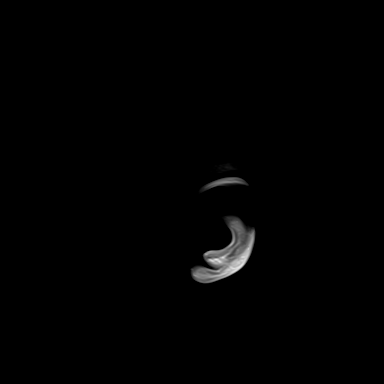

[38 of 48 positions shown; findings below may reference images not displayed]

FINDINGS: MRI HEAD FINDINGS

Brain: No acute infarct, mass effect or extra-axial collection.
Multiple chronic microhemorrhages in a predominantly peripheral
distribution. There is multifocal hyperintense T2-weighted signal
within the white matter. Parenchymal volume and CSF spaces are
normal. The midline structures are normal. There is no abnormal
contrast enhancement.

Vascular: Major flow voids are preserved.

Skull and upper cervical spine: Normal calvarium and skull base.
Visualized upper cervical spine and soft tissues are normal.

Sinuses/Orbits:No paranasal sinus fluid levels or advanced mucosal
thickening. No mastoid or middle ear effusion. Normal orbits.

MRA HEAD FINDINGS

POSTERIOR CIRCULATION:

--Vertebral arteries: Normal

--Inferior cerebellar arteries: Normal.

--Basilar artery: Normal.

--Superior cerebellar arteries: Normal.

--Posterior cerebral arteries: Normal.

ANTERIOR CIRCULATION:

--Intracranial internal carotid arteries: Normal.

--Anterior cerebral arteries (ACA): Normal.

--Middle cerebral arteries (MCA): Normal.

ANATOMIC VARIANTS: Both posterior communicating arteries are patent

MRA NECK FINDINGS

No vertebral or carotid artery abnormality.
IMPRESSION: 1. No acute intracranial abnormality.
2. Normal MRA of the head and neck.
3. Multifocal chronic microhemorrhages in a predominantly peripheral
distribution.

## 2021-04-17 IMAGING — MR MR MRA HEAD W/O CM
1 series · 19 of 48 positions shown · IV contrast (gadavist)
Comparison: None.

CLINICAL DATA: Headache and dizziness with neck pain

EXAM:
MRI HEAD WITHOUT AND WITH CONTRAST
MRA HEAD WITHOUT CONTRAST
MRA NECK WITHOUT AND WITH CONTRAST
TECHNIQUE: Multiplanar, multiecho pulse sequences of the brain and surrounding
structures were obtained without and with intravenous contrast.
Angiographic images of the Circle of Willis were obtained using MRA
technique without intravenous contrast. Angiographic images of the
neck were obtained using MRA technique without and with intravenous
contrast. Carotid stenosis measurements (when applicable) are
obtained utilizing NASCET criteria, using the distal internal
carotid diameter as the denominator.
CONTRAST:  10mL GADAVIST GADOBUTROL 1 MMOL/ML IV SOLN

[Series 9: TOF · axial · 0.5mm · 0.41mm/px · z∈[-92,+5]mm · 19 of 205 slices shown]
[im 1/205]
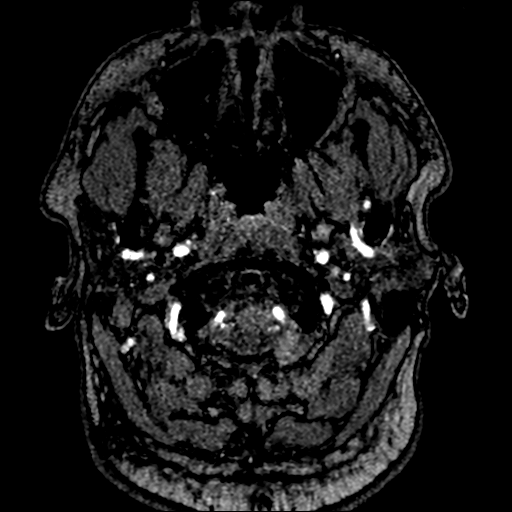
[im 5/205]
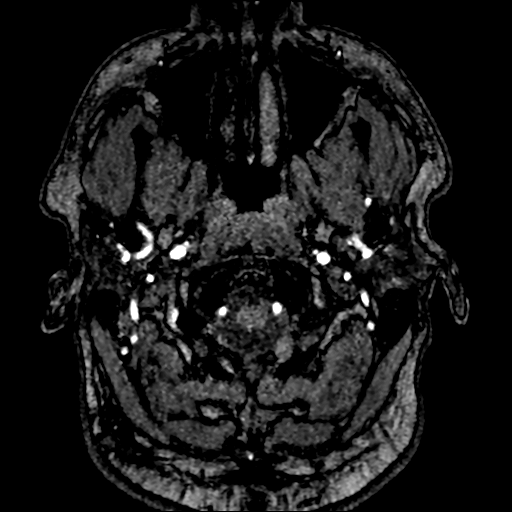
[im 9/205]
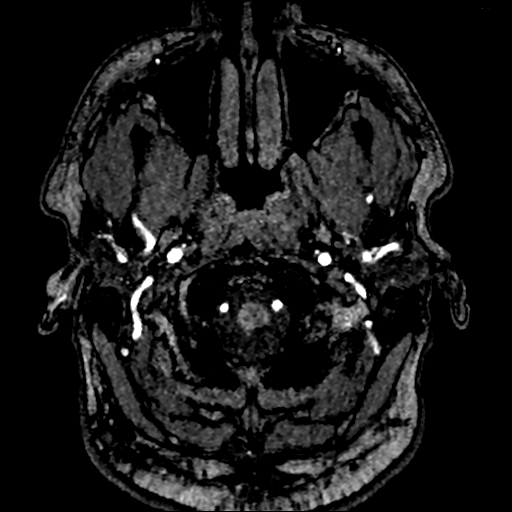
[im 14/205]
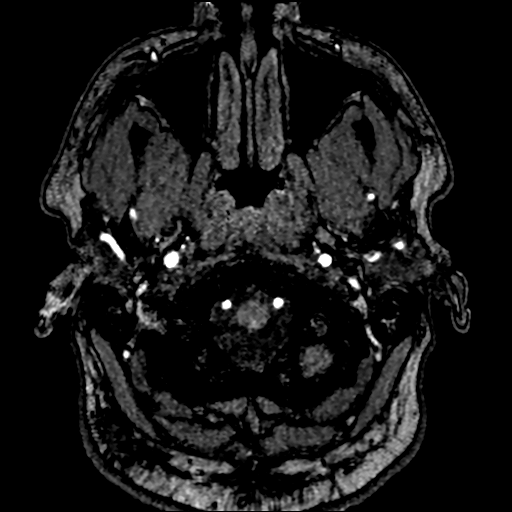
[im 18/205]
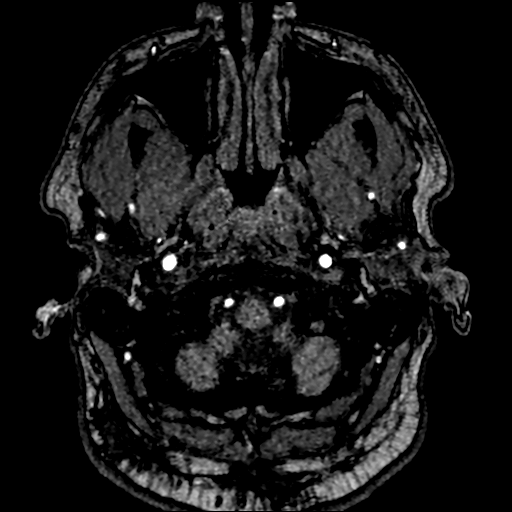
[im 22/205]
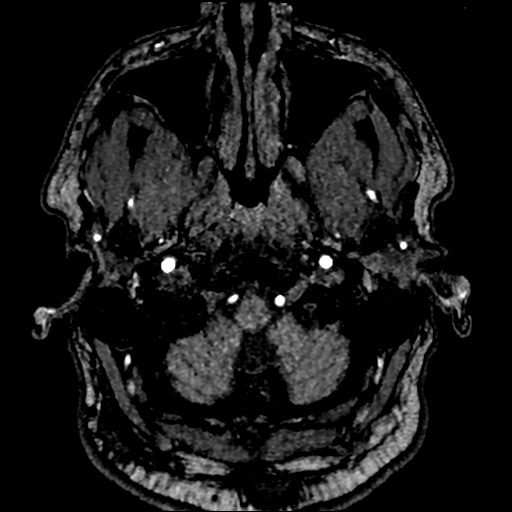
[im 27/205]
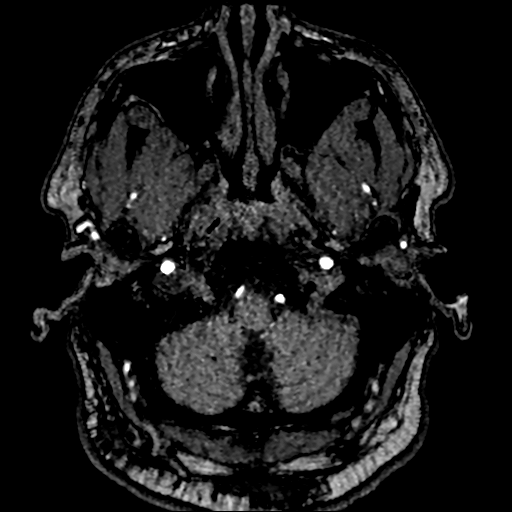
[im 31/205]
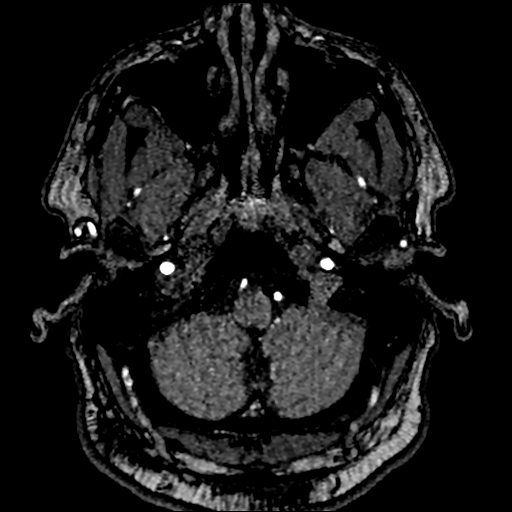
[im 35/205]
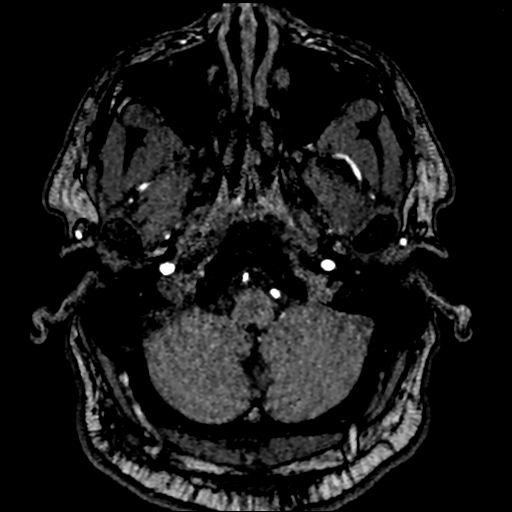
[im 40/205]
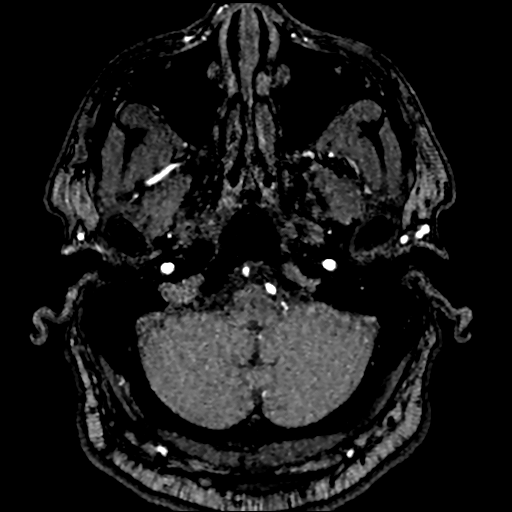
[im 44/205]
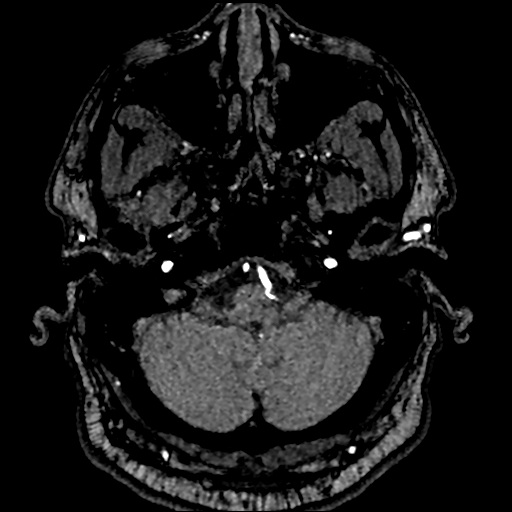
[im 66/205]
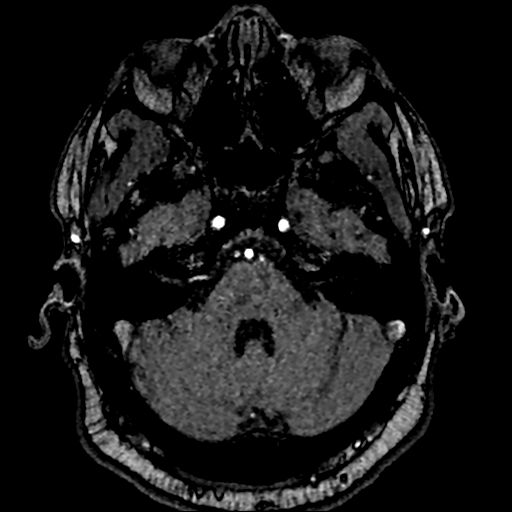
[im 92/205]
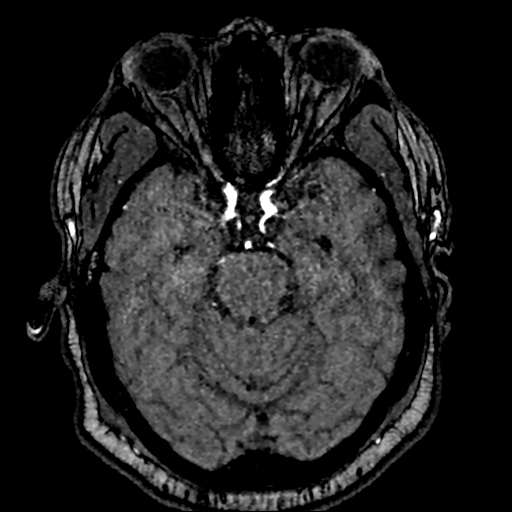
[im 105/205]
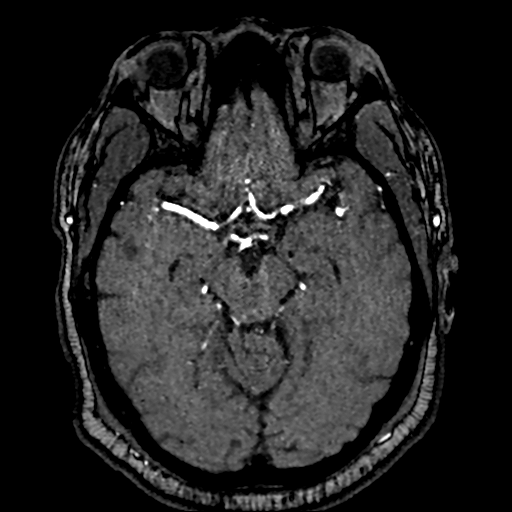
[im 118/205]
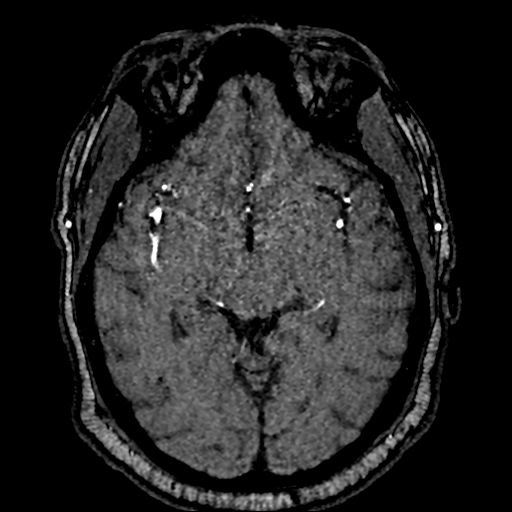
[im 144/205]
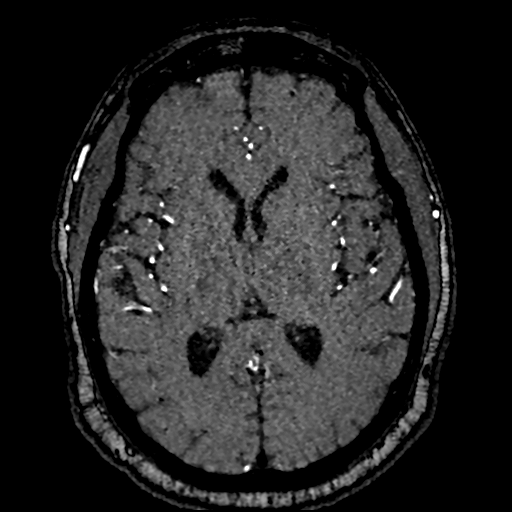
[im 170/205]
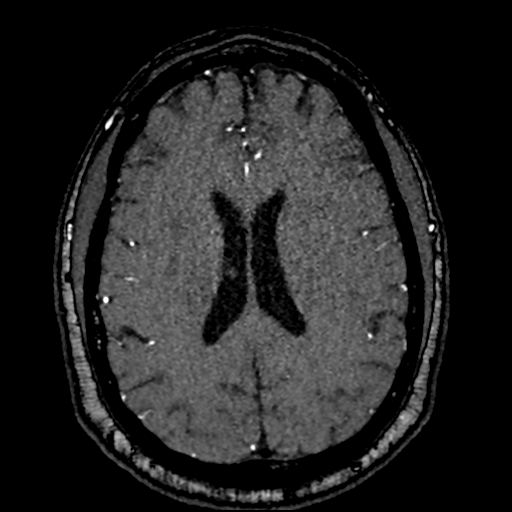
[im 174/205]
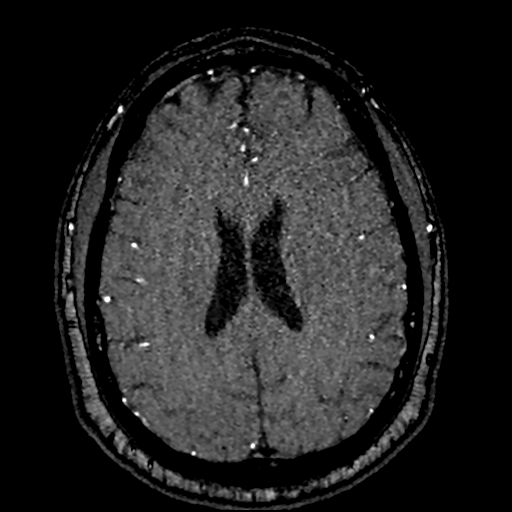
[im 196/205]
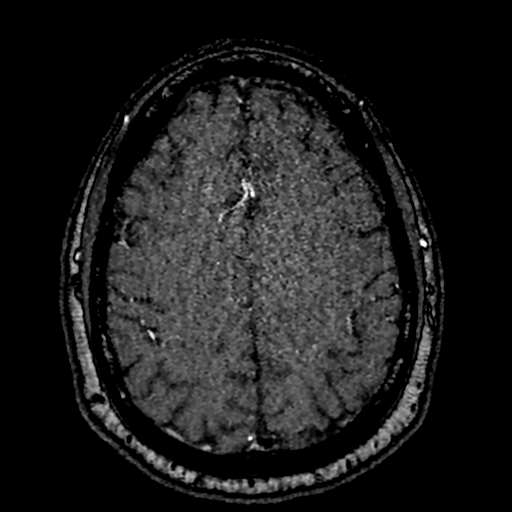

[19 of 48 positions shown; findings below may reference images not displayed]

FINDINGS: MRI HEAD FINDINGS

Brain: No acute infarct, mass effect or extra-axial collection.
Multiple chronic microhemorrhages in a predominantly peripheral
distribution. There is multifocal hyperintense T2-weighted signal
within the white matter. Parenchymal volume and CSF spaces are
normal. The midline structures are normal. There is no abnormal
contrast enhancement.

Vascular: Major flow voids are preserved.

Skull and upper cervical spine: Normal calvarium and skull base.
Visualized upper cervical spine and soft tissues are normal.

Sinuses/Orbits:No paranasal sinus fluid levels or advanced mucosal
thickening. No mastoid or middle ear effusion. Normal orbits.

MRA HEAD FINDINGS

POSTERIOR CIRCULATION:

--Vertebral arteries: Normal

--Inferior cerebellar arteries: Normal.

--Basilar artery: Normal.

--Superior cerebellar arteries: Normal.

--Posterior cerebral arteries: Normal.

ANTERIOR CIRCULATION:

--Intracranial internal carotid arteries: Normal.

--Anterior cerebral arteries (ACA): Normal.

--Middle cerebral arteries (MCA): Normal.

ANATOMIC VARIANTS: Both posterior communicating arteries are patent

MRA NECK FINDINGS

No vertebral or carotid artery abnormality.
IMPRESSION: 1. No acute intracranial abnormality.
2. Normal MRA of the head and neck.
3. Multifocal chronic microhemorrhages in a predominantly peripheral
distribution.

## 2021-04-17 MED ORDER — VANCOMYCIN HCL IN DEXTROSE 1-5 GM/200ML-% IV SOLN
1000.0000 mg | Freq: Once | INTRAVENOUS | Status: AC
  Administered 2021-04-17: 1000 mg via INTRAVENOUS
  Filled 2021-04-17: qty 200

## 2021-04-17 MED ORDER — VANCOMYCIN HCL 1500 MG/300ML IV SOLN
1500.0000 mg | Freq: Once | INTRAVENOUS | Status: AC
  Administered 2021-04-17: 1500 mg via INTRAVENOUS
  Filled 2021-04-17: qty 300

## 2021-04-17 MED ORDER — INSULIN ASPART 100 UNIT/ML IJ SOLN
0.0000 [IU] | Freq: Three times a day (TID) | INTRAMUSCULAR | Status: DC
  Administered 2021-04-17 – 2021-04-18 (×2): 9 [IU] via SUBCUTANEOUS
  Administered 2021-04-18: 5 [IU] via SUBCUTANEOUS
  Administered 2021-04-19: 1 [IU] via SUBCUTANEOUS
  Administered 2021-04-19 – 2021-04-20 (×2): 2 [IU] via SUBCUTANEOUS
  Administered 2021-04-20 (×2): 3 [IU] via SUBCUTANEOUS
  Administered 2021-04-21: 2 [IU] via SUBCUTANEOUS
  Administered 2021-04-21: 7 [IU] via SUBCUTANEOUS
  Administered 2021-04-21: 2 [IU] via SUBCUTANEOUS
  Administered 2021-04-22: 7 [IU] via SUBCUTANEOUS
  Administered 2021-04-22 – 2021-04-23 (×2): 2 [IU] via SUBCUTANEOUS
  Administered 2021-04-23: 7 [IU] via SUBCUTANEOUS
  Administered 2021-04-23: 9 [IU] via SUBCUTANEOUS
  Administered 2021-04-24: 5 [IU] via SUBCUTANEOUS
  Administered 2021-04-24: 2 [IU] via SUBCUTANEOUS
  Administered 2021-04-24: 5 [IU] via SUBCUTANEOUS
  Administered 2021-04-25: 2 [IU] via SUBCUTANEOUS
  Administered 2021-04-25: 3 [IU] via SUBCUTANEOUS
  Filled 2021-04-17 (×21): qty 1

## 2021-04-17 MED ORDER — CEFEPIME HCL 2 G IJ SOLR
2.0000 g | Freq: Once | INTRAMUSCULAR | Status: AC
Start: 2021-04-17 — End: 2021-04-17
  Administered 2021-04-17: 2 g via INTRAVENOUS
  Filled 2021-04-17: qty 2

## 2021-04-17 MED ORDER — SODIUM CHLORIDE 0.9 % IV BOLUS
1000.0000 mL | Freq: Once | INTRAVENOUS | Status: AC
  Administered 2021-04-17: 1000 mL via INTRAVENOUS

## 2021-04-17 MED ORDER — LABETALOL HCL 5 MG/ML IV SOLN
20.0000 mg | Freq: Once | INTRAVENOUS | Status: AC
  Administered 2021-04-17: 20 mg via INTRAVENOUS
  Filled 2021-04-17: qty 4

## 2021-04-17 MED ORDER — INSULIN ASPART 100 UNIT/ML IJ SOLN
0.0000 [IU] | Freq: Every day | INTRAMUSCULAR | Status: DC
  Administered 2021-04-17: 4 [IU] via SUBCUTANEOUS
  Administered 2021-04-18: 3 [IU] via SUBCUTANEOUS
  Administered 2021-04-22: 5 [IU] via SUBCUTANEOUS
  Administered 2021-04-23: 3 [IU] via SUBCUTANEOUS
  Administered 2021-04-24: 2 [IU] via SUBCUTANEOUS
  Filled 2021-04-17 (×5): qty 1

## 2021-04-17 MED ORDER — VANCOMYCIN HCL IN DEXTROSE 1-5 GM/200ML-% IV SOLN: 1000.0000 mg | Freq: Once | INTRAVENOUS | Status: DC

## 2021-04-17 MED ORDER — METRONIDAZOLE 500 MG/100ML IV SOLN
500.0000 mg | Freq: Once | INTRAVENOUS | Status: AC
  Administered 2021-04-17: 500 mg via INTRAVENOUS
  Filled 2021-04-17: qty 100

## 2021-04-17 MED ORDER — KETOROLAC TROMETHAMINE 30 MG/ML IJ SOLN
30.0000 mg | Freq: Once | INTRAMUSCULAR | Status: AC
  Administered 2021-04-17: 30 mg via INTRAVENOUS
  Filled 2021-04-17: qty 1

## 2021-04-17 MED ORDER — VITAMIN D 25 MCG (1000 UNIT) PO TABS
2000.0000 [IU] | ORAL_TABLET | Freq: Every day | ORAL | Status: DC
  Administered 2021-04-18 – 2021-04-25 (×8): 2000 [IU] via ORAL
  Filled 2021-04-17 (×8): qty 2

## 2021-04-17 MED ORDER — AMLODIPINE BESYLATE 10 MG PO TABS
10.0000 mg | ORAL_TABLET | Freq: Every day | ORAL | Status: DC
  Administered 2021-04-17 – 2021-04-18 (×2): 10 mg via ORAL
  Filled 2021-04-17: qty 1
  Filled 2021-04-17: qty 2

## 2021-04-17 MED ORDER — HEPARIN SODIUM (PORCINE) 5000 UNIT/ML IJ SOLN
5000.0000 [IU] | Freq: Three times a day (TID) | INTRAMUSCULAR | Status: DC
  Administered 2021-04-17 – 2021-04-25 (×20): 5000 [IU] via SUBCUTANEOUS
  Filled 2021-04-17 (×20): qty 1

## 2021-04-17 MED ORDER — NICARDIPINE HCL IN NACL 20-0.86 MG/200ML-% IV SOLN
3.0000 mg/h | INTRAVENOUS | Status: DC
  Administered 2021-04-17: 7.5 mg/h via INTRAVENOUS
  Administered 2021-04-17: 5 mg/h via INTRAVENOUS
  Filled 2021-04-17 (×5): qty 200

## 2021-04-17 MED ORDER — LOSARTAN POTASSIUM 50 MG PO TABS
100.0000 mg | ORAL_TABLET | Freq: Every day | ORAL | Status: DC
  Administered 2021-04-17 – 2021-04-25 (×9): 100 mg via ORAL
  Filled 2021-04-17 (×9): qty 2

## 2021-04-17 MED ORDER — SODIUM CHLORIDE 0.9 % IV SOLN: INTRAVENOUS | Status: DC

## 2021-04-17 MED ORDER — SODIUM CHLORIDE 0.9 % IV BOLUS
500.0000 mL | Freq: Once | INTRAVENOUS | Status: AC
  Administered 2021-04-17: 500 mL via INTRAVENOUS

## 2021-04-17 MED ORDER — INSULIN GLARGINE 100 UNIT/ML ~~LOC~~ SOLN
20.0000 [IU] | Freq: Every day | SUBCUTANEOUS | Status: DC
  Filled 2021-04-17 (×2): qty 0.2

## 2021-04-17 MED ORDER — INSULIN ASPART 100 UNIT/ML IJ SOLN
8.0000 [IU] | Freq: Once | INTRAMUSCULAR | Status: AC
  Administered 2021-04-17: 8 [IU] via SUBCUTANEOUS
  Filled 2021-04-17: qty 1

## 2021-04-17 MED ORDER — CARVEDILOL 25 MG PO TABS
25.0000 mg | ORAL_TABLET | Freq: Two times a day (BID) | ORAL | Status: DC
  Administered 2021-04-17 – 2021-04-25 (×16): 25 mg via ORAL
  Filled 2021-04-17 (×14): qty 1

## 2021-04-17 MED ORDER — INSULIN GLARGINE 100 UNIT/ML ~~LOC~~ SOLN
20.0000 [IU] | Freq: Every day | SUBCUTANEOUS | Status: DC
  Administered 2021-04-17 – 2021-04-18 (×2): 20 [IU] via SUBCUTANEOUS
  Filled 2021-04-17 (×3): qty 0.2

## 2021-04-17 MED ORDER — ASPIRIN EC 81 MG PO TBEC
81.0000 mg | DELAYED_RELEASE_TABLET | Freq: Every day | ORAL | Status: DC
  Administered 2021-04-17 – 2021-04-19 (×3): 81 mg via ORAL
  Filled 2021-04-17 (×3): qty 1

## 2021-04-17 MED ORDER — GADOBUTROL 1 MMOL/ML IV SOLN
10.0000 mL | Freq: Once | INTRAVENOUS | Status: AC | PRN
  Administered 2021-04-17: 10 mL via INTRAVENOUS

## 2021-04-17 NOTE — Consult Note (Signed)
PHARMACY -  BRIEF ANTIBIOTIC NOTE   Pharmacy has received consult(s) for vancomycin and cefepime from an ED provider.  The patient's profile has been reviewed for ht/wt/allergies/indication/available labs.    One time order(s) placed for cefepime 2 g and vancomycin 2500 mg (ordered as 1000 mg followed by 1500 mg)  Further antibiotics/pharmacy consults should be ordered by admitting physician if indicated.                       Thank you, Derrek Gu, PharmD 04/17/2021  3:44 PM

## 2021-04-17 NOTE — ED Triage Notes (Signed)
Pt c/o head ache with dizziness with neck pain and vomiting for the past 2 days

## 2021-04-17 NOTE — ED Notes (Signed)
Patient transported to CT 

## 2021-04-17 NOTE — ED Provider Notes (Signed)
[facility] Emergency Department Provider Note   ____________________________________________   Event Date/Time   First MD Initiated Contact with Patient 04/17/21 0935     (approximate)  I have reviewed the triage vital signs and the nursing notes.   HISTORY  Chief Complaint Headache and Vomiting    HPI Clayton [name] is a 50 y.o. male with a significant past medical history of hypertension who presents for headache for the last 2 days  LOCATION: Global head DURATION: 2 days TIMING: Worsening since onset SEVERITY: Severe QUALITY: Throbbing CONTEXT: Patient has felt ill and has been in bed for the last 2 days has been unable to take his blood pressure medicine MODIFYING FACTORS: Sitting up worsens this headache denies any relieving factors ASSOCIATED SYMPTOMS: Nausea/vomiting, lightheadedness, right sided lateral neck pain   Per medical record review history of hypertension          Past Medical History:  Diagnosis Date   Hypertension     There are no problems to display for this patient.   Past Surgical History:  Procedure Laterality Date   CATARACT EXTRACTION      Prior to Admission medications   Medication Sig Start Date End Date Taking? Authorizing Provider  carvedilol (COREG) 25 MG tablet Take 25 mg by mouth in the morning and at bedtime. 10/31/20 10/31/21 Yes [provider]  Cholecalciferol 50 MCG (2000 UT) TABS Take by mouth. 09/01/20  Yes [provider]  tadalafil (CIALIS) 20 MG tablet Take by mouth. 10/31/20  Yes [provider]  amLODipine (NORVASC) 10 MG tablet Take 10 mg by mouth daily. 04/11/21   [provider]  BESIVANCE 0.6 % SUSP Place 1 drop into the right eye 3 (three) times daily. 01/09/21   [provider]  carvedilol (COREG) 25 MG tablet Take 25 mg by mouth 2 (two) times daily. 04/11/21   [provider]  chlorthalidone (HYGROTON) 25 MG tablet Take 25 mg by mouth  daily. 04/11/21   [provider]  DUREZOL 0.05 % EMUL Place 1 drop into the right eye 4 (four) times daily. 01/09/21   [provider]  losartan (COZAAR) 100 MG tablet Take 100 mg by mouth daily. 04/11/21   [provider]  metFORMIN (GLUCOPHAGE-XR) 500 MG 24 hr tablet Take 500 mg by mouth 2 (two) times daily. 04/11/21   [provider]  PROLENSA 0.07 % SOLN Place 1 drop into the left eye at bedtime. 11/26/20   [provider]  Vitamin D, Ergocalciferol, (DRISDOL) 1.25 MG (50000 UNIT) CAPS capsule Take 50,000 Units by mouth once a week. 10/31/20   [provider]    Allergies Patient has no known allergies.  No family history on file.  Social History Social History   Tobacco Use   Smoking status: Never   Smokeless tobacco: Never  Substance Use Topics   Alcohol use: Yes   Drug use: Not Currently    Review of Systems Constitutional: No fever/chills Eyes: No visual changes. ENT: No sore throat. Cardiovascular: Denies chest pain. Respiratory: Denies shortness of breath. Gastrointestinal: No abdominal pain.  No nausea, no vomiting.  No diarrhea. Genitourinary: Negative for dysuria. Musculoskeletal: Negative for acute arthralgias Skin: Negative for rash. Neurological: Endorses headaches, denies weakness/numbness/paresthesias in any extremity Psychiatric: Negative for suicidal ideation/homicidal ideation   ____________________________________________   PHYSICAL EXAM:  VITAL SIGNS: ED Triage Vitals  Enc Vitals Group     BP 04/17/21 0929 (!) 229/125     Pulse Rate  04/17/21 0929 73     Resp 04/17/21 0929 17     Temp 04/17/21 0929 98.8 F (37.1 C)     Temp Source 04/17/21 0929 Oral     SpO2 04/17/21 0929 98 %     Weight 04/17/21 0927 263 lb (119.3 kg)     Height 04/17/21 0927 5\' 11"  (1.803 m)     Head Circumference --      Peak Flow --      Pain Score 04/17/21 0927 7     Pain Loc --      Pain Edu? --      Excl. in GC? --     Constitutional: Alert and oriented. Well appearing overweight African-American male who appears stated age and is in no acute distress. Eyes: Conjunctivae are normal. PERRL.  No photophobia Head: Atraumatic. Nose: No congestion/rhinnorhea. Mouth/Throat: Mucous membranes are moist. Neck: No stridor.  No meningismus.  Slight tenderness to palpation over right SCM Cardiovascular: Grossly normal heart sounds.  Good peripheral circulation. Respiratory: Normal respiratory effort.  No retractions. Gastrointestinal: Soft and nontender. No distention. Musculoskeletal: No obvious deformities Neurologic:  Normal speech and language. No gross focal neurologic deficits are appreciated. Skin:  Skin is warm and dry. No rash noted. Psychiatric: Mood and affect are normal. Speech and behavior are normal.  ____________________________________________   LABS (all labs ordered are listed, but only abnormal results are displayed)  Labs Reviewed  BASIC METABOLIC PANEL - Abnormal; Notable for the following components:      Result Value   Sodium 132 (*)    Chloride 88 (*)    Glucose, Bld 441 (*)    BUN 30 (*)    Creatinine, Ser 2.37 (*)    GFR, Estimated 33 (*)    Anion gap 16 (*)    All other components within normal limits  CBC - Abnormal; Notable for the following components:   WBC 17.3 (*)    All other components within normal limits  BLOOD GAS, VENOUS - Abnormal; Notable for the following components:   pCO2, Ven 43 (*)    Acid-base deficit 2.6 (*)    All other components within normal limits  LACTIC ACID, PLASMA - Abnormal; Notable for the following components:   Lactic Acid, Venous 2.8 (*)    All other components within normal limits  RESP PANEL BY RT-PCR (FLU A&B, COVID) ARPGX2  URINALYSIS, COMPLETE (UACMP) WITH MICROSCOPIC  LACTIC ACID, PLASMA  CBG MONITORING, ED   ____________________________________________  EKG  ED ECG REPORT I, 04/19/21, the attending physician,  personally viewed and interpreted this ECG.  Date: 04/17/2021 EKG Time: 0935 Rate: 70 Rhythm: normal sinus rhythm QRS Axis: normal Intervals: normal ST/T Wave abnormalities: normal Narrative Interpretation: no evidence of acute ischemia  ____________________________________________  RADIOLOGY  ED MD interpretation: CT of the head without contrast shows no evidence of acute abnormalities including no intracerebral hemorrhage, obvious masses, or significant edema  CT of the chest abdomen and pelvis without contrast shows no evidence of acute abnormalities  Official radiology report(s): CT Head Wo Contrast  Result Date: 04/17/2021 CLINICAL DATA:  Headache and dizziness. EXAM: CT HEAD WITHOUT CONTRAST TECHNIQUE: Contiguous axial images were obtained from the base of the skull through the vertex without intravenous contrast. COMPARISON:  None. FINDINGS: Brain: No evidence of acute infarction, hemorrhage, hydrocephalus, extra-axial collection or mass lesion/mass effect. Vascular: No hyperdense vessel or unexpected calcification. Skull: Normal. Negative for fracture or focal lesion. Sinuses/Orbits: No acute finding. Other: None. IMPRESSION:  1. Normal noncontrast head CT. Electronically Signed   By: Obie Dredge M.D.   On: 04/17/2021 11:57   CT CHEST ABDOMEN PELVIS WO CONTRAST  Result Date: 04/17/2021 CLINICAL DATA:  Elevated white count. Vomiting, dizziness and fatigue. EXAM: CT CHEST, ABDOMEN AND PELVIS WITHOUT CONTRAST TECHNIQUE: Multidetector CT imaging of the chest, abdomen and pelvis was performed following the standard protocol without IV contrast. COMPARISON:  None. FINDINGS: CT CHEST FINDINGS Cardiovascular: Heart size is normal. No visible coronary artery calcification or aortic atherosclerotic calcification. Mediastinum/Nodes: No mass or lymphadenopathy. Lungs/Pleura: No pleural effusion. No pulmonary infiltrate or collapse. No mass or nodule. No emphysema or interstitial lung  disease. Musculoskeletal: Bridging osteophytes throughout the mid to lower thoracic region suggesting diffuse idiopathic skeletal hyperostosis. CT ABDOMEN PELVIS FINDINGS Hepatobiliary: Normal Pancreas: Normal Spleen: Normal Adrenals/Urinary Tract: Adrenal glands are normal. Kidneys are normal without contrast. Bladder is normal. Stomach/Bowel: Stomach and small intestine are normal. Normal appendix. No abnormal colon finding. Vascular/Lymphatic: Minimal aortic atherosclerosis. No aneurysm. IVC is normal. No adenopathy. Reproductive: Normal Other: No free fluid or air. Musculoskeletal: Ordinary lower lumbar degenerative changes. IMPRESSION: No acute finding to explain the clinical presentation. Normal evaluation of the chest without contrast. No acutely significant abdominal or pelvic finding. Aortic atherosclerosis, mild. Bridging osteophytes in the thoracic region suggesting diffuse idiopathic skeletal hyperostosis. Lower lumbar degenerative disc disease. Electronically Signed   By: Paulina Fusi M.D.   On: 04/17/2021 14:42    ____________________________________________   PROCEDURES  Procedure(s) performed (including Critical Care):  .1-3 Lead EKG Interpretation  Date/Time: 04/18/2021 11:27 PM Performed by: Merwyn Katos, MD Authorized by: Merwyn Katos, MD     Interpretation: normal     ECG rate:  63   ECG rate assessment: normal     Rhythm: sinus rhythm     Ectopy: none     Conduction: normal   .Critical Care  Date/Time: 04/18/2021 11:27 PM Performed by: Merwyn Katos, MD Authorized by: Merwyn Katos, MD   Critical care provider statement:    Critical care time (minutes):  51   Critical care was necessary to treat or prevent imminent or life-threatening deterioration of the following conditions:  CNS failure or compromise and circulatory failure   Critical care was time spent personally by me on the following activities:  Discussions with consultants, evaluation of patient's  response to treatment, examination of patient, ordering and performing treatments and interventions, ordering and review of laboratory studies, ordering and review of radiographic studies, pulse oximetry, re-evaluation of patient's condition, obtaining history from patient or surrogate and review of old charts   ____________________________________________   INITIAL IMPRESSION / ASSESSMENT AND PLAN / ED COURSE  As part of my medical decision making, I reviewed the following data within the electronic medical record, if available:  Nursing notes reviewed and incorporated, Labs reviewed, EKG interpreted, Old chart reviewed, Radiograph reviewed and Notes from prior ED visits reviewed and incorporated     Patient is a 50 year old male with the above-stated past medical history who presents for headache, nausea, and vomiting for the past 2 days. Patient significantly hypertensive that was refractory to treatment with 2 doses of 20 mg IV labetalol and therefore patient was placed on a Cardene drip.  Patient also initially with an elevated lactic acidosis and leukocytosis to 17 and therefore will cover empirically with antibiotics.  CT of the head, chest, abdomen, and pelvis did not show any acute abnormalities.  Patient still with persistent headache and nausea.  I spoke to Dr. Clyde LundborgNiu internal medicine agrees to accept the patient for hypertensive emergency       ____________________________________________   FINAL CLINICAL IMPRESSION(S) / ED DIAGNOSES  Final diagnoses:  Vomiting     ED Discharge Orders     None        Note:  This document was prepared using Dragon voice recognition software and may include unintentional dictation errors.    Merwyn KatosBradler, Colinda Barth K, MD 04/18/21 [phone]

## 2021-04-17 NOTE — H&P (Addendum)
History and Physical    Abron Neddo ZYS:[id number] DOB: 01/10/1971 DOA: 04/17/2021  Referring MD/NP/PA:   PCP: [facility]   Patient coming from:  The patient is coming from home.  At baseline, pt is independent for most of ADL.        Chief Complaint: Nausea, vomiting, headache, right-sided neck pain  HPI: Clayton [name] is a 50 y.o. male with medical history significant of hypertension, diabetes mellitus, CKD stage IIIb, who presents with nausea, vomiting, headache, right-sided neck pain.  Per his wife, patient has been having nausea, vomiting for more than 2 days.  He has vomited 5-6 times each day.  No diarrhea.  Per his wife, vomiting induced some central abdominal pain. Patient does not have chest pain, cough, shortness breath.  No fever or chills.  Patient states that he cannot take oral medications due to severe nausea and vomiting.  He developed headache which is located in the front head and is throbbing pain.  He also has lightheadedness, no unilateral numbness or tinglings in extremities.  No facial droop or slurred speech.  Patient complains of sharp right-sided neck pain.  No neck rigidity. Pt is drowsy, but easily arousable. Patient is orientated x3.  Patient was found to have elevated blood pressure 247/115.  Patient was given IV labetalol 20 mg x 2 without significant improvement of blood pressure.  Cardene drip is started in ED.  ED Course: pt was found to have WBC 17.3, trop 44, lactic acid 2.8 --> 3.0, negative COVID PCR, renal function close to baseline, temperature normal, heart rate 89, RR 19, oxygen sat 91-100% on room air.  CT of head is negative for acute intracranial abnormalities.  CT of chest/abdomen/pelvis is negative for acute issues.  VBG with pH 7.34, CO2 43, O2 37. Patient is admitted to progressive bed as inpatient.  Review of Systems:   General: no fevers, chills, no body weight gain, has poor appetite, has fatigue HEENT: no  blurry vision, hearing changes or sore throat. Has right sided neck pain. Respiratory: no dyspnea, coughing, wheezing CV: no chest pain, no palpitations GI: has nausea, vomiting, abdominal pain, no diarrhea, constipation GU: no dysuria, burning on urination, increased urinary frequency, hematuria  Ext: no leg edema Neuro: no unilateral weakness, numbness, or tingling, no vision change or hearing loss. Has lethargy and drowsiness.  Has lightheadedness. Skin: no rash, no skin tear. MSK: No muscle spasm, no deformity, no limitation of range of movement in spin Heme: No easy bruising.  Travel history: No recent long distant travel.  Allergy: No Known Allergies  Past Medical History:  Diagnosis Date   CKD (chronic kidney disease), stage III (HCC)    Hypertension    Type II diabetes mellitus with renal manifestations (HCC)     Past Surgical History:  Procedure Laterality Date   CATARACT EXTRACTION      Social History:  reports that he has never smoked. He has never used smokeless tobacco. He reports current alcohol use. He reports previous drug use.  Family History:  Family History  Problem Relation Age of Onset   Stroke Mother    Hypertension Mother    Hypertension Father      Prior to Admission medications   Medication Sig Start Date End Date Taking? Authorizing Provider  carvedilol (COREG) 25 MG tablet Take 25 mg by mouth in the morning and at bedtime. 10/31/20 10/31/21 Yes [provider]  Cholecalciferol 50 MCG (2000 UT) TABS Take by mouth.  09/01/20  Yes [provider]  tadalafil (CIALIS) 20 MG tablet Take by mouth. 10/31/20  Yes [provider]  amLODipine (NORVASC) 10 MG tablet Take 10 mg by mouth daily. 04/11/21   [provider]  BESIVANCE 0.6 % SUSP Place 1 drop into the right eye 3 (three) times daily. 01/09/21   [provider]  carvedilol (COREG) 25 MG tablet Take 25 mg by mouth 2 (two) times daily. 04/11/21   [provider]  chlorthalidone (HYGROTON) 25 MG tablet Take 25 mg by mouth daily. 04/11/21   [provider]  DUREZOL 0.05 % EMUL Place 1 drop into the right eye 4 (four) times daily. 01/09/21   [provider]  losartan (COZAAR) 100 MG tablet Take 100 mg by mouth daily. 04/11/21   [provider]  metFORMIN (GLUCOPHAGE-XR) 500 MG 24 hr tablet Take 500 mg by mouth 2 (two) times daily. 04/11/21   [provider]  PROLENSA 0.07 % SOLN Place 1 drop into the left eye at bedtime. 11/26/20   [provider]  Vitamin D, Ergocalciferol, (DRISDOL) 1.25 MG (50000 UNIT) CAPS capsule Take 50,000 Units by mouth once a week. 10/31/20   [provider]    Physical Exam: Vitals:   04/17/21 1600 04/17/21 1630 04/17/21 1700 04/17/21 1711  BP: (!) 160/77 (!) 177/78 (!) 160/82   Pulse: 79 83 81   Resp: 18 18 17    Temp:      TempSrc:      SpO2: 97% 92% 91% 95%  Weight:      Height:       General: Not in acute distress HEENT:       Eyes: PERRL, EOMI, no scleral icterus.       ENT: No discharge from the ears and nose, no pharynx injection, no tonsillar enlargement.        Neck: No JVD, no bruit, no mass felt. Heme: No neck lymph node enlargement. Cardiac: S1/S2, RRR, No murmurs, No gallops or rubs. Respiratory: No rales, wheezing, rhonchi or rubs. GI: Soft, nondistended, nontender, no rebound pain, no organomegaly, BS present. GU: No hematuria Ext: No pitting leg edema bilaterally. 1+DP/PT pulse bilaterally. Musculoskeletal: No joint deformities, No joint redness or warmth, no limitation of ROM in spin. Skin: No rashes.  Neuro: Patient is lethargic, easily arousable, oriented X3, cranial nerves II-XII grossly intact, moves all extremities normally.  Psych: Patient is not psychotic, no suicidal or hemocidal ideation.  Labs on Admission: I have personally reviewed following labs and imaging studies  CBC: Recent Labs  Lab 04/17/21 0940  WBC 17.3*  HGB  16.1  HCT 46.0  MCV 89.7  PLT 252   Basic Metabolic Panel: Recent Labs  Lab 04/17/21 0940  NA 132*  K 4.3  CL 88*  CO2 28  GLUCOSE 441*  BUN 30*  CREATININE 2.37*  CALCIUM 9.8   GFR: Estimated Creatinine Clearance: 50.1 mL/min (A) (by C-G formula based on SCr of 2.37 mg/dL (H)). Liver Function Tests: No results for input(s): AST, ALT, ALKPHOS, BILITOT, PROT, ALBUMIN in the last 168 hours. No results for input(s): LIPASE, AMYLASE in the last 168 hours. No results for input(s): AMMONIA in the last 168 hours. Coagulation Profile: Recent Labs  Lab 04/17/21 0940  INR 1.0   Cardiac Enzymes: No results for input(s): CKTOTAL, CKMB, CKMBINDEX, TROPONINI in the last 168 hours. BNP (last 3 results) No results for input(s): PROBNP in the last 8760 hours. HbA1C: No results for input(s):  HGBA1C in the last 72 hours. CBG: Recent Labs  Lab 04/17/21 1716  GLUCAP 456*   Lipid Profile: No results for input(s): CHOL, HDL, LDLCALC, TRIG, CHOLHDL, LDLDIRECT in the last 72 hours. Thyroid Function Tests: No results for input(s): TSH, T4TOTAL, FREET4, T3FREE, THYROIDAB in the last 72 hours. Anemia Panel: No results for input(s): VITAMINB12, FOLATE, FERRITIN, TIBC, IRON, RETICCTPCT in the last 72 hours. Urine analysis: No results found for: COLORURINE, APPEARANCEUR, LABSPEC, PHURINE, GLUCOSEU, HGBUR, BILIRUBINUR, KETONESUR, PROTEINUR, UROBILINOGEN, NITRITE, LEUKOCYTESUR Sepsis Labs: (procalcitonin:4,lacticidven:4) ) Recent Results (from the past 240 hour(s))  Resp Panel by RT-PCR (Flu A&B, Covid) Nasopharyngeal Swab     Status: None   Collection Time: 04/17/21  9:40 AM   Specimen: Nasopharyngeal Swab; Nasopharyngeal(NP) swabs in vial transport medium  Result Value Ref Range Status   SARS Coronavirus 2 by RT PCR NEGATIVE NEGATIVE Final    Comment: (NOTE) SARS-CoV-2 target nucleic acids are NOT DETECTED.  The SARS-CoV-2 RNA is generally detectable in upper  respiratory specimens during the acute phase of infection. The lowest concentration of SARS-CoV-2 viral copies this assay can detect is 138 copies/mL. A negative result does not preclude SARS-Cov-2 infection and should not be used as the sole basis for treatment or other patient management decisions. A negative result may occur with  improper specimen collection/handling, submission of specimen other than nasopharyngeal swab, presence of viral mutation(s) within the areas targeted by this assay, and inadequate number of viral copies(<138 copies/mL). A negative result must be combined with clinical observations, patient history, and epidemiological information. The expected result is Negative.  Fact Sheet for Patients:  BloggerCourse.com  Fact Sheet for Healthcare Providers:  SeriousBroker.it  This test is no t yet approved or cleared by the [place] FDA and  has been authorized for detection and/or diagnosis of SARS-CoV-2 by FDA under an Emergency Use Authorization (EUA). This EUA will remain  in effect (meaning this test can be used) for the duration of the COVID-19 declaration under Section 564(b)(1) of the Act, 21 U.S.C.section 360bbb-3(b)(1), unless the authorization is terminated  or revoked sooner.       Influenza A by PCR NEGATIVE NEGATIVE Final   Influenza B by PCR NEGATIVE NEGATIVE Final    Comment: (NOTE) The Xpert Xpress SARS-CoV-2/FLU/RSV plus assay is intended as an aid in the diagnosis of influenza from Nasopharyngeal swab specimens and should not be used as a sole basis for treatment. Nasal washings and aspirates are unacceptable for Xpert Xpress SARS-CoV-2/FLU/RSV testing.  Fact Sheet for Patients: BloggerCourse.com  Fact Sheet for Healthcare Providers: SeriousBroker.it  This test is not yet approved or cleared by the [place] FDA and has been  authorized for detection and/or diagnosis of SARS-CoV-2 by FDA under an Emergency Use Authorization (EUA). This EUA will remain in effect (meaning this test can be used) for the duration of the COVID-19 declaration under Section 564(b)(1) of the Act, 21 U.S.C. section 360bbb-3(b)(1), unless the authorization is terminated or revoked.  Performed at [facility], [address] [place], [place] [address]      Radiological Exams on Admission: CT Head Wo Contrast  Result Date: 04/17/2021 CLINICAL DATA:  Headache and dizziness. EXAM: CT HEAD WITHOUT CONTRAST TECHNIQUE: Contiguous axial images were obtained from the base of the skull through the vertex without intravenous contrast. COMPARISON:  None. FINDINGS: Brain: No evidence of acute infarction, hemorrhage, hydrocephalus, extra-axial collection or mass lesion/mass effect. Vascular: No hyperdense vessel or unexpected calcification. Skull: Normal. Negative for fracture or  focal lesion. Sinuses/Orbits: No acute finding. Other: None. IMPRESSION: 1. Normal noncontrast head CT. Electronically Signed   By: Obie Dredge M.D.   On: 04/17/2021 11:57   CT CHEST ABDOMEN PELVIS WO CONTRAST  Result Date: 04/17/2021 CLINICAL DATA:  Elevated white count. Vomiting, dizziness and fatigue. EXAM: CT CHEST, ABDOMEN AND PELVIS WITHOUT CONTRAST TECHNIQUE: Multidetector CT imaging of the chest, abdomen and pelvis was performed following the standard protocol without IV contrast. COMPARISON:  None. FINDINGS: CT CHEST FINDINGS Cardiovascular: Heart size is normal. No visible coronary artery calcification or aortic atherosclerotic calcification. Mediastinum/Nodes: No mass or lymphadenopathy. Lungs/Pleura: No pleural effusion. No pulmonary infiltrate or collapse. No mass or nodule. No emphysema or interstitial lung disease. Musculoskeletal: Bridging osteophytes throughout the mid to lower thoracic region suggesting diffuse idiopathic skeletal hyperostosis. CT  ABDOMEN PELVIS FINDINGS Hepatobiliary: Normal Pancreas: Normal Spleen: Normal Adrenals/Urinary Tract: Adrenal glands are normal. Kidneys are normal without contrast. Bladder is normal. Stomach/Bowel: Stomach and small intestine are normal. Normal appendix. No abnormal colon finding. Vascular/Lymphatic: Minimal aortic atherosclerosis. No aneurysm. IVC is normal. No adenopathy. Reproductive: Normal Other: No free fluid or air. Musculoskeletal: Ordinary lower lumbar degenerative changes. IMPRESSION: No acute finding to explain the clinical presentation. Normal evaluation of the chest without contrast. No acutely significant abdominal or pelvic finding. Aortic atherosclerosis, mild. Bridging osteophytes in the thoracic region suggesting diffuse idiopathic skeletal hyperostosis. Lower lumbar degenerative disc disease. Electronically Signed   By: Paulina Fusi M.D.   On: 04/17/2021 14:42     EKG: I have personally reviewed.  Sinus rhythm, QTC 466, LAE, T wave inversion in the inferior leads, poor R wave progression  Assessment/Plan Principal Problem:   Hypertensive emergency Active Problems:   Nausea & vomiting   Acute metabolic encephalopathy   CKD (chronic kidney disease), stage IIIb   Type II diabetes mellitus with renal manifestations (HCC)   Leukocytosis   Elevated lactic acid level   Elevated troponin   Hypertensive emergency: Bp is up to 247/115.  Did not response to 2 doses of IV labetalol 20 mg treatment.  Cardene drip was started.  Patient complains of right sided neck pain, severe headache, also has lethargy.  Need to r/o carotid artery dissection.  Patient may have hypertensive encephalopathy.  -Admitted to progressive bed as inpatient -Continue Cardene drip to target blood pressure at 160-180 range. -Continue home amlodipine, Coreg, Cozaar -Hold Hygroton since patient needs IV fluid for elevated blood sugar -will get MRA of neck and head and MRI-braiin -UDS  Elevated troponin: No  chest pain.  Troponin level 44.  Likely due to demand ischemia. -Check A1c, FLP,UDS -Trend troponin -asa 81 mg daily  Nausea & vomiting: Etiology is not clear.  CT of chest/abdomen/pelvis is negative for acute issues.  May be due to viral gastritis -Check lipase -IV fluid -As needed Zofran  Acute metabolic encephalopathy: Possibly due to hypertensive encephalopathy -f/u MRI-brain -Frequent neuro check  CKD (chronic kidney disease), stage IIIb: Recent baseline creatinine 2.1-2.3.  His creatinine is at 2.37, BUN 30.  Close to baseline. -f/u BMP  Type II diabetes mellitus with renal manifestations [facility]): Recent A1c 7.4, poorly controlled.  Patient taking metformin.  Blood sugar 441, anion gap 16, but her bicarbonate is 28.  ABG showed pH 7.34, does not seem to have DKA. Patient is at high risk of developing DKA. -Start Lantus 20 unit daily -Sliding scale insulin -IV fluid: 2.5 L normal saline, followed by 125 cc/h  Leukocytosis and elevated lactic acid level: Patient  has WBC 17.3.  Elevated lactic acid at 2.8 --> 3.0.  But no fever, no tachycardia or tachypnea.  Does not meets criteria for sepsis or SIRS.  No source of infection identified.  Leukocytosis likely reactive.  Elevated lactic acid is possibly due to dehydration.  Patient received 1 dose of vancomycin, cefepime and Flagyl.  Will hold antibiotics now and get procalcitonin level -IV fluid as above -Trend lactic acid level -Follow-up of blood culture and urinalysis    DVT ppx: SQ Heparin    Code Status: Full code Family Communication:   Yes, patient's  wife at bed side Disposition Plan:  Anticipate discharge back to previous environment Consults called:  none Admission status and Level of care: Progressive Cardiac:    as inpt       Status is: Inpatient  Remains inpatient appropriate because:Inpatient level of care appropriate due to severity of illness  Dispo: The patient is from: Home              Anticipated d/c is  to: Home              Patient currently is not medically stable to d/c.   Difficult to place patient No           Date of Service 04/17/2021    Lorretta Harp [facility]   If 7PM-7AM, please contact night-coverage [email or web address] 04/17/2021, 5:41 PM

## 2021-04-17 NOTE — ED Notes (Signed)
This RN was in MRI with pt monitoring BP for about 45 minutes. Pt is now back in room.

## 2021-04-18 ENCOUNTER — Inpatient Hospital Stay: Payer: 59

## 2021-04-18 DIAGNOSIS — I10 Essential (primary) hypertension: Secondary | ICD-10-CM | POA: Diagnosis not present

## 2021-04-18 DIAGNOSIS — I161 Hypertensive emergency: Secondary | ICD-10-CM | POA: Diagnosis not present

## 2021-04-18 DIAGNOSIS — R7989 Other specified abnormal findings of blood chemistry: Secondary | ICD-10-CM | POA: Diagnosis not present

## 2021-04-18 DIAGNOSIS — R778 Other specified abnormalities of plasma proteins: Secondary | ICD-10-CM

## 2021-04-18 LAB — GLUCOSE, CAPILLARY
Glucose-Capillary: 385 mg/dL — ABNORMAL HIGH (ref 70–99)
Glucose-Capillary: 356 mg/dL — ABNORMAL HIGH (ref 70–99)
Glucose-Capillary: 267 mg/dL — ABNORMAL HIGH (ref 70–99)
Glucose-Capillary: 98 mg/dL (ref 70–99)
Glucose-Capillary: 252 mg/dL — ABNORMAL HIGH (ref 70–99)

## 2021-04-18 LAB — BLOOD CULTURE ID PANEL (REFLEXED) - BCID2

## 2021-04-18 LAB — BASIC METABOLIC PANEL
Anion gap: 8 (ref 5–15)
BUN: 35 mg/dL — ABNORMAL HIGH (ref 6–20)
CO2: 25 mmol/L (ref 22–32)
Calcium: 8.5 mg/dL — ABNORMAL LOW (ref 8.9–10.3)
Chloride: 97 mmol/L — ABNORMAL LOW (ref 98–111)
Creatinine, Ser: 2.56 mg/dL — ABNORMAL HIGH (ref 0.61–1.24)
GFR, Estimated: 30 mL/min — ABNORMAL LOW (ref >60)
Glucose, Bld: 364 mg/dL — ABNORMAL HIGH (ref 70–99)
Potassium: 3.6 mmol/L (ref 3.5–5.1)
Sodium: 130 mmol/L — ABNORMAL LOW (ref 135–145)

## 2021-04-18 LAB — MRSA NEXT GEN BY PCR, NASAL: MRSA by PCR Next Gen: NOT DETECTED

## 2021-04-18 LAB — CBC
HCT: 41.8 % (ref 39.0–52.0)
Hemoglobin: 14.9 g/dL (ref 13.0–17.0)
MCH: 32.3 pg (ref 26.0–34.0)
MCHC: 35.6 g/dL (ref 30.0–36.0)
MCV: 90.5 fL (ref 80.0–100.0)
Platelets: 230 10*3/uL (ref 150–400)
RBC: 4.62 MIL/uL (ref 4.22–5.81)
RDW: 11.6 % (ref 11.5–15.5)
WBC: 12.5 10*3/uL — ABNORMAL HIGH (ref 4.0–10.5)
nRBC: 0 % (ref 0.0–0.2)

## 2021-04-18 LAB — LIPID PANEL
Cholesterol: 198 mg/dL (ref 0–200)
HDL: 43 mg/dL (ref >40)
LDL Cholesterol: 100 mg/dL — ABNORMAL HIGH (ref 0–99)
Total CHOL/HDL Ratio: 4.6 RATIO
Triglycerides: 275 mg/dL — ABNORMAL HIGH (ref <150)
VLDL: 55 mg/dL — ABNORMAL HIGH (ref 0–40)

## 2021-04-18 LAB — HIV ANTIBODY (ROUTINE TESTING W REFLEX): HIV Screen 4th Generation wRfx: NONREACTIVE

## 2021-04-18 IMAGING — CT CT HEAD CODE STROKE
1 series · 15 of 30 positions shown, 19 images · non-contrast
Comparison: None.

CLINICAL DATA: Code stroke.  Seizure

EXAM:
CT HEAD WITHOUT CONTRAST
TECHNIQUE: Contiguous axial images were obtained from the base of the skull
through the vertex without intravenous contrast.

[Series 3: head wo · axial · 0.42mm/px · z∈[-127,+28]mm · 15 of 35 slices shown, 19 images]
[im 2/35  brain]
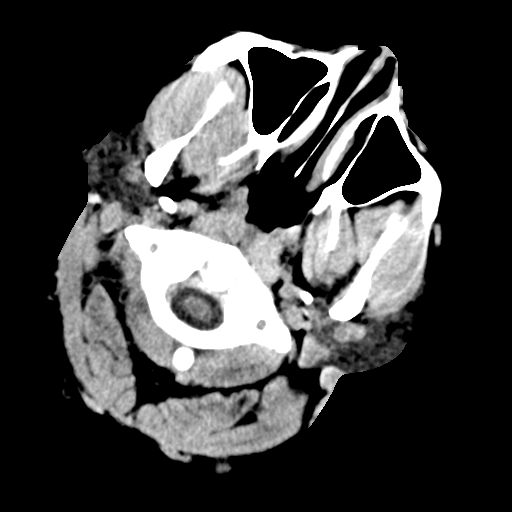
[im 2/35  bone]
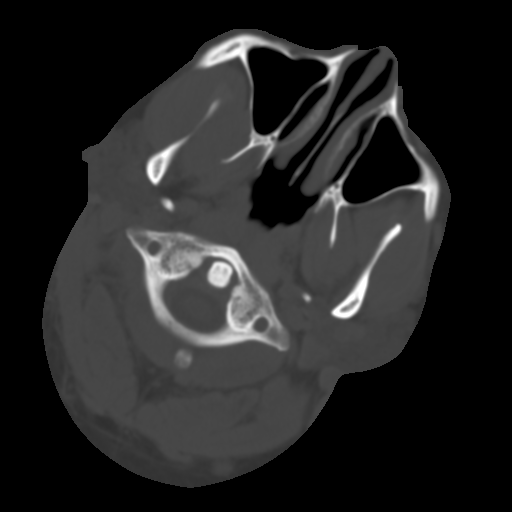
[im 4/35  brain]
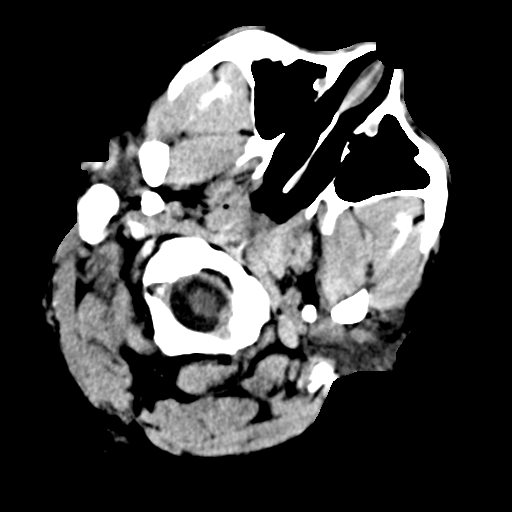
[im 6/35  brain]
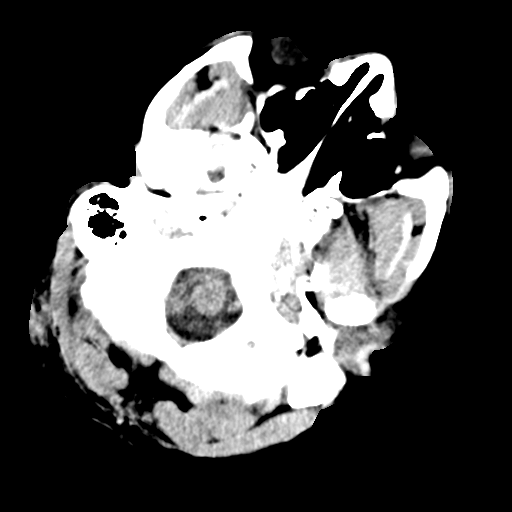
[im 9/35  brain]
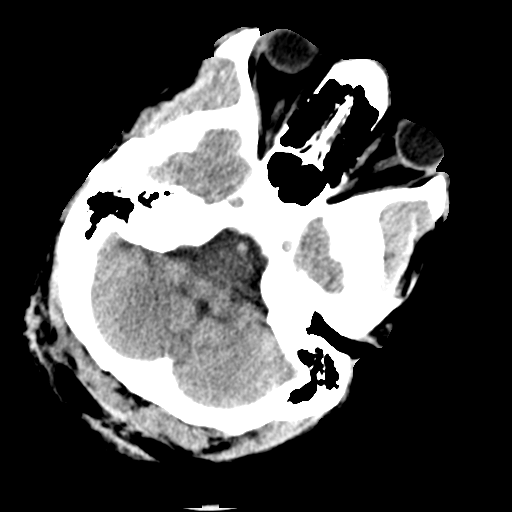
[im 11/35  brain]
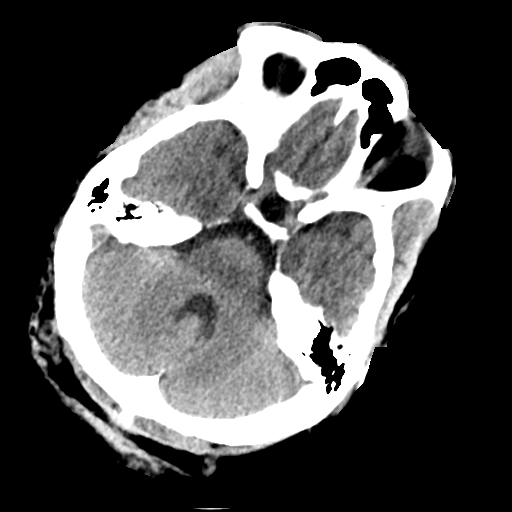
[im 11/35  bone]
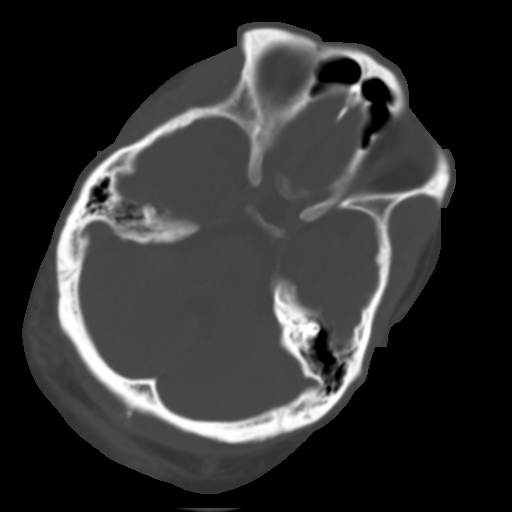
[im 13/35  brain]
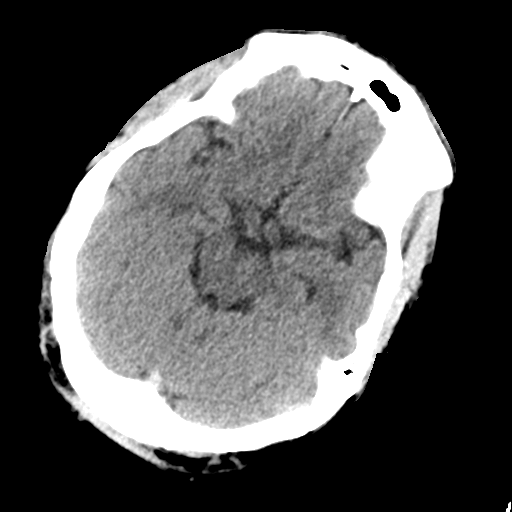
[im 16/35  brain]
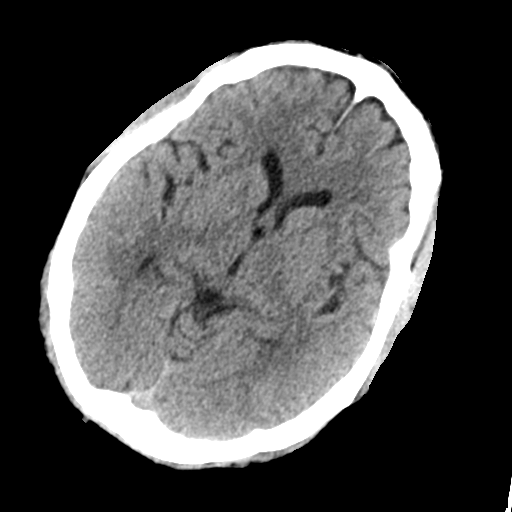
[im 18/35  brain]
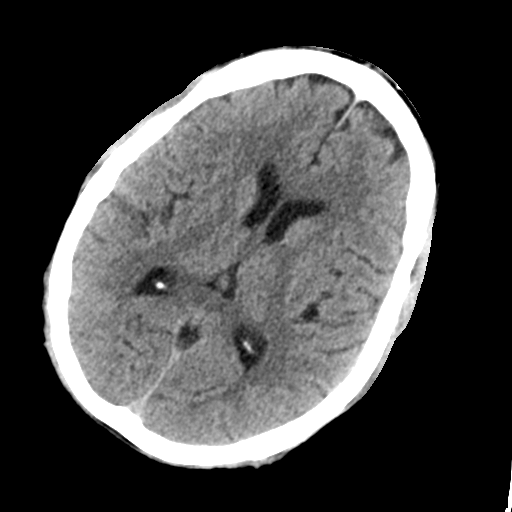
[im 19/35  brain]
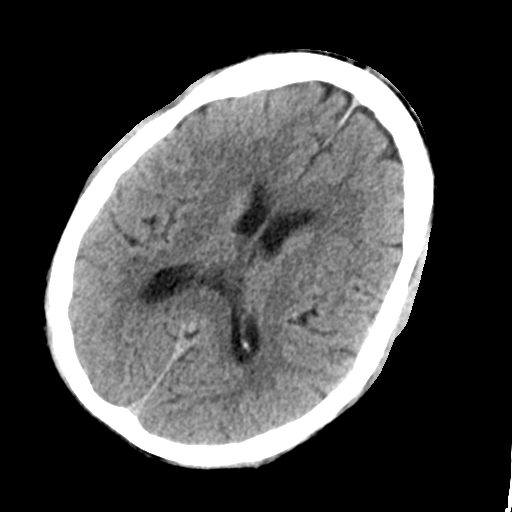
[im 19/35  bone]
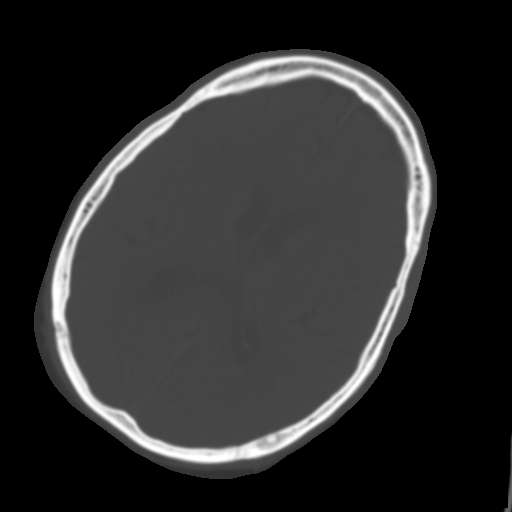
[im 22/35  brain]
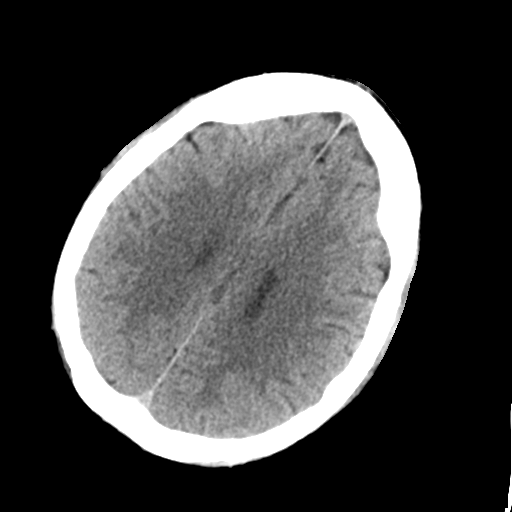
[im 24/35  brain]
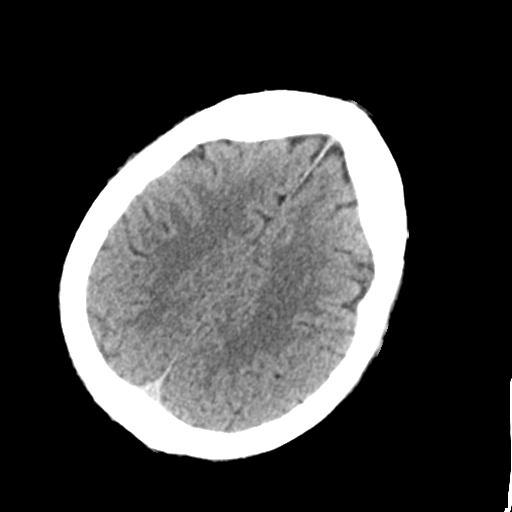
[im 26/35  brain]
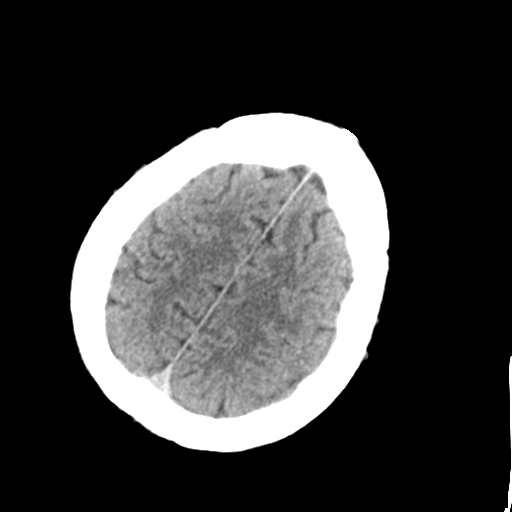
[im 29/35  brain]
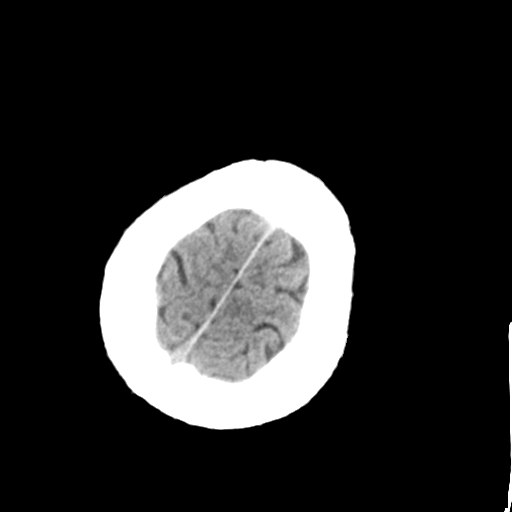
[im 29/35  bone]
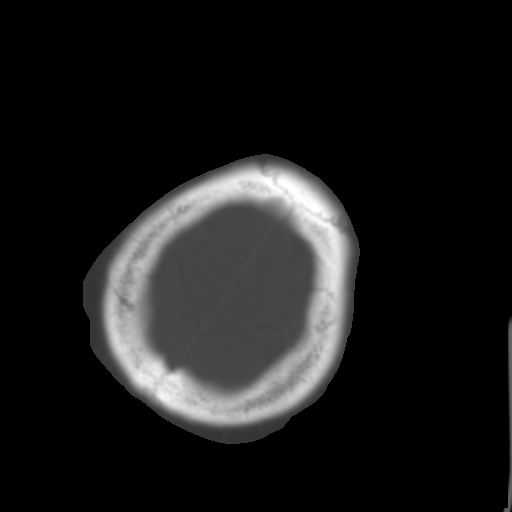
[im 31/35  brain]
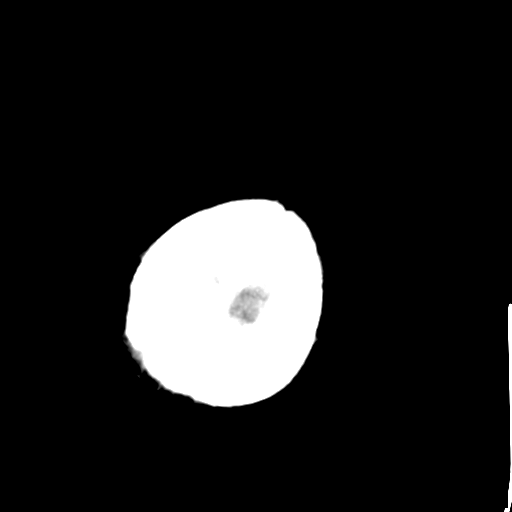
[im 33/35  brain]
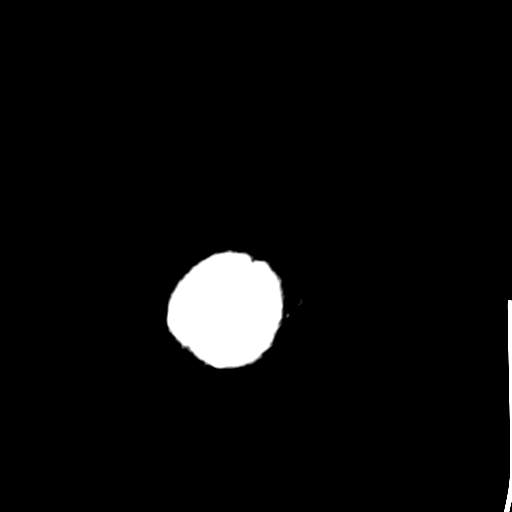

[15 of 30 positions shown; findings below may reference images not displayed]

FINDINGS: Brain: There is no mass, hemorrhage or extra-axial collection. The
size and configuration of the ventricles and extra-axial CSF spaces
are normal. The brain parenchyma is normal, without evidence of
acute or chronic infarction.

Vascular: No abnormal hyperdensity of the major intracranial
arteries or dural venous sinuses. No intracranial atherosclerosis.

Skull: The visualized skull base, calvarium and extracranial soft
tissues are normal.

Sinuses/Orbits: No fluid levels or advanced mucosal thickening of
the visualized paranasal sinuses. No mastoid or middle ear effusion.
The orbits are normal.

ASPECTS (Alberta Stroke Program Early CT Score)

- Ganglionic level infarction (caudate, lentiform nuclei, internal
capsule, insula, M1-M3 cortex): 7

- Supraganglionic infarction (M4-M6 cortex): 3

Total score (0-10 with 10 being normal): 10
IMPRESSION: 1. Normal head CT.
2. ASPECTS is 10.

These results were called by telephone at the time of interpretation
on [DATE] at [DATE] to provider JODEE , who
verbally acknowledged these results.

## 2021-04-18 MED ORDER — LEVETIRACETAM IN NACL 1500 MG/100ML IV SOLN
1500.0000 mg | Freq: Once | INTRAVENOUS | Status: AC
  Administered 2021-04-18: 1500 mg via INTRAVENOUS
  Filled 2021-04-18: qty 100

## 2021-04-18 MED ORDER — MIDAZOLAM HCL 2 MG/2ML IJ SOLN
INTRAMUSCULAR | Status: AC
  Administered 2021-04-18: 1 mg via INTRAVENOUS
  Filled 2021-04-18: qty 8

## 2021-04-18 MED ORDER — INSULIN ASPART 100 UNIT/ML IJ SOLN
8.0000 [IU] | Freq: Three times a day (TID) | INTRAMUSCULAR | Status: DC
  Administered 2021-04-18 – 2021-04-19 (×2): 8 [IU] via SUBCUTANEOUS
  Filled 2021-04-18 (×2): qty 1

## 2021-04-18 MED ORDER — HALOPERIDOL LACTATE 5 MG/ML IJ SOLN: 5.0000 mg | Freq: Four times a day (QID) | INTRAMUSCULAR | Status: DC | PRN

## 2021-04-18 MED ORDER — MIDAZOLAM HCL 2 MG/2ML IJ SOLN: 1.0000 mg | Freq: Once | INTRAMUSCULAR | Status: AC

## 2021-04-18 MED ORDER — SODIUM CHLORIDE 0.9 % IV SOLN: INTRAVENOUS | Status: DC

## 2021-04-18 MED ORDER — LEVETIRACETAM IN NACL 1000 MG/100ML IV SOLN
1000.0000 mg | Freq: Two times a day (BID) | INTRAVENOUS | Status: DC
  Administered 2021-04-19 – 2021-04-24 (×11): 1000 mg via INTRAVENOUS
  Filled 2021-04-18 (×15): qty 100

## 2021-04-18 MED ORDER — DIPHENHYDRAMINE HCL 50 MG/ML IJ SOLN: 50.0000 mg | Freq: Once | INTRAMUSCULAR | Status: DC

## 2021-04-18 MED ORDER — FENTANYL CITRATE (PF) 100 MCG/2ML IJ SOLN: 25.0000 ug | Freq: Once | INTRAMUSCULAR | Status: DC

## 2021-04-18 MED ORDER — LORAZEPAM 2 MG/ML IJ SOLN
1.0000 mg | INTRAMUSCULAR | Status: DC | PRN
  Administered 2021-04-18: 2 mg via INTRAVENOUS
  Filled 2021-04-18 (×2): qty 1

## 2021-04-18 MED ORDER — CLONIDINE HCL 0.1 MG PO TABS
0.2000 mg | ORAL_TABLET | Freq: Two times a day (BID) | ORAL | Status: DC
  Administered 2021-04-18: 0.2 mg via ORAL
  Filled 2021-04-18 (×2): qty 2

## 2021-04-18 MED ORDER — LIVING WELL WITH DIABETES BOOK
Freq: Once | Status: AC
  Filled 2021-04-18: qty 1

## 2021-04-18 MED ORDER — INSULIN STARTER KIT- PEN NEEDLES (ENGLISH)
1.0000 | Freq: Once | Status: AC
  Administered 2021-04-18: 1
  Filled 2021-04-18: qty 1

## 2021-04-18 MED ORDER — CHLORHEXIDINE GLUCONATE CLOTH 2 % EX PADS
6.0000 | MEDICATED_PAD | Freq: Every day | CUTANEOUS | Status: DC
  Administered 2021-04-19 – 2021-04-25 (×8): 6 via TOPICAL

## 2021-04-18 MED ORDER — SODIUM CHLORIDE 0.9 % IV SOLN: 10.0000 mg/kg | INTRAVENOUS | Status: DC

## 2021-04-18 MED ORDER — OXYCODONE-ACETAMINOPHEN 5-325 MG PO TABS
1.0000 | ORAL_TABLET | ORAL | Status: DC | PRN
  Administered 2021-04-18 (×2): 1 via ORAL
  Filled 2021-04-18 (×2): qty 1

## 2021-04-18 MED ORDER — LEVETIRACETAM IN NACL 500 MG/100ML IV SOLN
500.0000 mg | Freq: Two times a day (BID) | INTRAVENOUS | Status: DC
  Filled 2021-04-18: qty 100

## 2021-04-18 MED ORDER — CLONIDINE HCL 0.3 MG/24HR TD PTWK
0.3000 mg | MEDICATED_PATCH | TRANSDERMAL | Status: DC
  Administered 2021-04-19: 0.3 mg via TRANSDERMAL
  Filled 2021-04-18: qty 1

## 2021-04-18 MED ORDER — ONDANSETRON HCL 4 MG/2ML IJ SOLN
4.0000 mg | Freq: Four times a day (QID) | INTRAMUSCULAR | Status: DC | PRN
  Administered 2021-04-18 – 2021-04-22 (×3): 4 mg via INTRAVENOUS
  Filled 2021-04-18 (×3): qty 2

## 2021-04-18 MED ORDER — ONDANSETRON HCL 4 MG/2ML IJ SOLN: 4.0000 mg | Freq: Four times a day (QID) | INTRAMUSCULAR | Status: DC

## 2021-04-18 NOTE — Progress Notes (Signed)
PROGRESS NOTE    Clayton [name]  WGY:[id number] DOB: [birth year] DOA: 04/17/2021 PCP: [place]   Chief complaint.  Nausea vomiting headache. Brief Narrative:  Clayton [name] is a 50 y.o. male with medical history significant of hypertension, diabetes mellitus, CKD stage IIIb, who presents with nausea, vomiting, headache, right-sided neck pain. Upon arriving the hospital, blood pressure was 247/115.  Patient was continued on home medicines, was also given labetalol IV push, then she was placed on nicardipine drip. MRI brain was performed, showed microhemorrhages, I discussed the case with Dr. Cheral Marker, he has reviewed the MRI images, currently there is no concern for future bleeding.  Okay to continue aspirin and a prophylactic heparin. Carotid MRA did not show any carotid dissection. 7/1.  Discontinued nicardipine, started on clonidine 0.2 mg twice a day.  Increase in dose.   Assessment & Plan:   Principal Problem:   Hypertensive emergency Active Problems:   Nausea & vomiting   Acute metabolic encephalopathy   CKD (chronic kidney disease), stage IIIb   Type II diabetes mellitus with renal manifestations (HCC)   Leukocytosis   Elevated lactic acid level   Elevated troponin  #1.  Hypertensive emergency. Hypertensive encephalopathy.   Elevated troponin secondary to hypertensive emergency. Intracranial microhemorrhages. Patient states that he does not regularly check his blood pressure, last time he see his family doctor was 2 months ago. Patient still has significant headaches, added Percocet.  Blood pressure is better, transition blood pressure medicine to oral.  Started clonidine. Transfer patient to progressive for another day of monitoring. Discussed with neurology, micro hemorrhages are small, currently not a concern.  #2.  Chronic kidney disease stage IIIb. Hyponatremia. Continue to follow.  Worsening renal function secondary to hypertension  emergency.  Recheck a BMP tomorrow.  3.  Uncontrolled type 2 diabetes with hyperglycemia Continue Lantus, added scheduled lispro in addition to sliding scale insulin.  We will adjust dose again tomorrow.  4.  Lactic acidosis Secondary to metformin.  Metformin discontinued.   DVT prophylaxis: Heparin Code Status: full Family Communication: Wife updated at bedside. Disposition Plan:    Status is: Inpatient  Remains inpatient appropriate because:Inpatient level of care appropriate due to severity of illness  Dispo: The patient is from: Home              Anticipated d/c is to: Home              Patient currently is not medically stable to d/c.   Difficult to place patient No        I/O last 3 completed shifts: In: 1798.8 [I.V.:1798.8] Out: -  Total I/O In: 577 [I.V.:577] Out: -      Consultants:  None  Procedures: None  Antimicrobials: None   Subjective: Patient still has some headaches this morning, received a Percocet.  Pain is better afterwards.  No nausea vomiting.  No abdominal pain. No short of breath or cough. No dysuria hematuria  No fever chills  No chest pain or palpitation.   Objective: Vitals:   04/18/21 0600 04/18/21 0800 04/18/21 0814 04/18/21 0830  BP: 130/69 (!) 174/90 (!) 174/90 (!) 154/89  Pulse: 78 84  75  Resp: 16 16  (!) 7  Temp:  98.6 F (37 C)    TempSrc:  Oral    SpO2: 95% 95%  96%  Weight:      Height:        Intake/Output Summary (Last 24 hours) at 04/18/2021 0944 Last data  filed at 04/18/2021 0800 Gross per 24 hour  Intake 2375.79 ml  Output --  Net 2375.79 ml   Filed Weights   04/17/21 0927 04/17/21 2300  Weight: 119.3 kg 119.3 kg    Examination:  General exam: Appears calm and comfortable  Respiratory system: Clear to auscultation. Respiratory effort normal. Cardiovascular system: S1 & S2 heard, RRR. No JVD, murmurs, rubs, gallops or clicks. No pedal edema. Gastrointestinal system: Abdomen is nondistended, soft  and nontender. No organomegaly or masses felt. Normal bowel sounds heard. Central nervous system: Alert and oriented x3. No focal neurological deficits.  No meningeal signs.  Extremities: Symmetric 5 x 5 power. Skin: No rashes, lesions or ulcers Psychiatry: Judgement and insight appear normal. Mood & affect appropriate.     Data Reviewed: I have personally reviewed following labs and imaging studies  CBC: Recent Labs  Lab 04/17/21 0940 04/18/21 0440  WBC 17.3* 12.5*  HGB 16.1 14.9  HCT 46.0 41.8  MCV 89.7 90.5  PLT 252 034   Basic Metabolic Panel: Recent Labs  Lab 04/17/21 0940 04/18/21 0440  NA 132* 130*  K 4.3 3.6  CL 88* 97*  CO2 28 25  GLUCOSE 441* 364*  BUN 30* 35*  CREATININE 2.37* 2.56*  CALCIUM 9.8 8.5*   GFR: Estimated Creatinine Clearance: 46.4 mL/min (A) (by C-G formula based on SCr of 2.56 mg/dL (H)). Liver Function Tests: No results for input(s): AST, ALT, ALKPHOS, BILITOT, PROT, ALBUMIN in the last 168 hours. Recent Labs  Lab 04/17/21 0940  LIPASE 27   No results for input(s): AMMONIA in the last 168 hours. Coagulation Profile: Recent Labs  Lab 04/17/21 0940  INR 1.0   Cardiac Enzymes: No results for input(s): CKTOTAL, CKMB, CKMBINDEX, TROPONINI in the last 168 hours. BNP (last 3 results) No results for input(s): PROBNP in the last 8760 hours. HbA1C: No results for input(s): HGBA1C in the last 72 hours. CBG: Recent Labs  Lab 04/17/21 1716 04/17/21 2204 04/17/21 2311 04/18/21 0319 04/18/21 0739  GLUCAP 456* 346* 457* 385* 356*   Lipid Profile: Recent Labs    04/18/21 0440  CHOL 198  HDL 43  LDLCALC 100*  TRIG 275*  CHOLHDL 4.6   Thyroid Function Tests: No results for input(s): TSH, T4TOTAL, FREET4, T3FREE, THYROIDAB in the last 72 hours. Anemia Panel: No results for input(s): VITAMINB12, FOLATE, FERRITIN, TIBC, IRON, RETICCTPCT in the last 72 hours. Sepsis Labs: Recent Labs  Lab 04/17/21 0940 04/17/21 1218  04/17/21 1522 04/17/21 1638 04/17/21 1945 04/17/21 2215  PROCALCITON <0.10  --   --   --   --   --   LATICACIDVEN  --    < > 3.0* 2.9* 3.7* 2.5*   < > = values in this interval not displayed.    Recent Results (from the past 240 hour(s))  Resp Panel by RT-PCR (Flu A&B, Covid) Nasopharyngeal Swab     Status: None   Collection Time: 04/17/21  9:40 AM   Specimen: Nasopharyngeal Swab; Nasopharyngeal(NP) swabs in vial transport medium  Result Value Ref Range Status   SARS Coronavirus 2 by RT PCR NEGATIVE NEGATIVE Final    Comment: (NOTE) SARS-CoV-2 target nucleic acids are NOT DETECTED.  The SARS-CoV-2 RNA is generally detectable in upper respiratory specimens during the acute phase of infection. The lowest concentration of SARS-CoV-2 viral copies this assay can detect is 138 copies/mL. A negative result does not preclude SARS-Cov-2 infection and should not be used as the sole basis for treatment or other  patient management decisions. A negative result may occur with  improper specimen collection/handling, submission of specimen other than nasopharyngeal swab, presence of viral mutation(s) within the areas targeted by this assay, and inadequate number of viral copies(<138 copies/mL). A negative result must be combined with clinical observations, patient history, and epidemiological information. The expected result is Negative.  Fact Sheet for Patients:  EntrepreneurPulse.com.au  Fact Sheet for Healthcare Providers:  IncredibleEmployment.be  This test is no t yet approved or cleared by the [place] FDA and  has been authorized for detection and/or diagnosis of SARS-CoV-2 by FDA under an Emergency Use Authorization (EUA). This EUA will remain  in effect (meaning this test can be used) for the duration of the COVID-19 declaration under Section 564(b)(1) of the Act, 21 U.S.C.section 360bbb-3(b)(1), unless the authorization is terminated  or  revoked sooner.       Influenza A by PCR NEGATIVE NEGATIVE Final   Influenza B by PCR NEGATIVE NEGATIVE Final    Comment: (NOTE) The Xpert Xpress SARS-CoV-2/FLU/RSV plus assay is intended as an aid in the diagnosis of influenza from Nasopharyngeal swab specimens and should not be used as a sole basis for treatment. Nasal washings and aspirates are unacceptable for Xpert Xpress SARS-CoV-2/FLU/RSV testing.  Fact Sheet for Patients: EntrepreneurPulse.com.au  Fact Sheet for Healthcare Providers: IncredibleEmployment.be  This test is not yet approved or cleared by the [place] FDA and has been authorized for detection and/or diagnosis of SARS-CoV-2 by FDA under an Emergency Use Authorization (EUA). This EUA will remain in effect (meaning this test can be used) for the duration of the COVID-19 declaration under Section 564(b)(1) of the Act, 21 U.S.C. section 360bbb-3(b)(1), unless the authorization is terminated or revoked.  Performed at [facility], [address] [place], [place] [address]   CULTURE, BLOOD (ROUTINE X 2) w Reflex to ID Panel     Status: None (Preliminary result)   Collection Time: 04/17/21  4:34 PM   Specimen: BLOOD  Result Value Ref Range Status   Specimen Description BLOOD RIGHT ANTECUBITAL  Final   Special Requests   Final    BOTTLES DRAWN AEROBIC AND ANAEROBIC Blood Culture results may not be optimal due to an inadequate volume of blood received in culture bottles   Culture   Final    NO GROWTH < 24 HOURS Performed at [facility], [address], [place], [place] [address]    Report Status PENDING  Incomplete  CULTURE, BLOOD (ROUTINE X 2) w Reflex to ID Panel     Status: None (Preliminary result)   Collection Time: 04/17/21  4:38 PM   Specimen: BLOOD  Result Value Ref Range Status   Specimen Description BLOOD LEFT ANTECUBITAL  Final   Special Requests   Final    BOTTLES DRAWN AEROBIC AND  ANAEROBIC Blood Culture results may not be optimal due to an inadequate volume of blood received in culture bottles   Culture   Final    NO GROWTH < 24 HOURS Performed at [facility], [address] [place], [place] [address]    Report Status PENDING  Incomplete  MRSA Next Gen by PCR, Nasal     Status: None   Collection Time: 04/18/21  1:37 AM   Specimen: Nasal Mucosa; Nasal Swab  Result Value Ref Range Status   MRSA by PCR Next Gen NOT DETECTED NOT DETECTED Final    Comment: (NOTE) The GeneXpert MRSA Assay (FDA approved for NASAL specimens only), is one component  of a comprehensive MRSA colonization surveillance program. It is not intended to diagnose MRSA infection nor to guide or monitor treatment for MRSA infections. Test performance is not FDA approved in patients less than 91 years old. Performed at [facility], [address] [place], [place] [address]          Radiology Studies: CT Head Wo Contrast  Result Date: 04/17/2021 CLINICAL DATA:  Headache and dizziness. EXAM: CT HEAD WITHOUT CONTRAST TECHNIQUE: Contiguous axial images were obtained from the base of the skull through the vertex without intravenous contrast. COMPARISON:  None. FINDINGS: Brain: No evidence of acute infarction, hemorrhage, hydrocephalus, extra-axial collection or mass lesion/mass effect. Vascular: No hyperdense vessel or unexpected calcification. Skull: Normal. Negative for fracture or focal lesion. Sinuses/Orbits: No acute finding. Other: None. IMPRESSION: 1. Normal noncontrast head CT. Electronically Signed   By: Titus Dubin M.D.   On: 04/17/2021 11:57   MR ANGIO HEAD WO CONTRAST  Result Date: 04/17/2021 CLINICAL DATA:  Headache and dizziness with neck pain EXAM: MRI HEAD WITHOUT AND WITH CONTRAST MRA HEAD WITHOUT CONTRAST MRA NECK WITHOUT AND WITH CONTRAST TECHNIQUE: Multiplanar, multiecho pulse sequences of the brain and surrounding structures were obtained without and  with intravenous contrast. Angiographic images of the Circle of Willis were obtained using MRA technique without intravenous contrast. Angiographic images of the neck were obtained using MRA technique without and with intravenous contrast. Carotid stenosis measurements (when applicable) are obtained utilizing NASCET criteria, using the distal internal carotid diameter as the denominator. CONTRAST:  55m GADAVIST GADOBUTROL 1 MMOL/ML IV SOLN COMPARISON:  None. FINDINGS: MRI HEAD FINDINGS Brain: No acute infarct, mass effect or extra-axial collection. Multiple chronic microhemorrhages in a predominantly peripheral distribution. There is multifocal hyperintense T2-weighted signal within the white matter. Parenchymal volume and CSF spaces are normal. The midline structures are normal. There is no abnormal contrast enhancement. Vascular: Major flow voids are preserved. Skull and upper cervical spine: Normal calvarium and skull base. Visualized upper cervical spine and soft tissues are normal. Sinuses/Orbits:No paranasal sinus fluid levels or advanced mucosal thickening. No mastoid or middle ear effusion. Normal orbits. MRA HEAD FINDINGS POSTERIOR CIRCULATION: --Vertebral arteries: Normal --Inferior cerebellar arteries: Normal. --Basilar artery: Normal. --Superior cerebellar arteries: Normal. --Posterior cerebral arteries: Normal. ANTERIOR CIRCULATION: --Intracranial internal carotid arteries: Normal. --Anterior cerebral arteries (ACA): Normal. --Middle cerebral arteries (MCA): Normal. ANATOMIC VARIANTS: Both posterior communicating arteries are patent MRA NECK FINDINGS No vertebral or carotid artery abnormality. IMPRESSION: 1. No acute intracranial abnormality. 2. Normal MRA of the head and neck. 3. Multifocal chronic microhemorrhages in a predominantly peripheral distribution. Electronically Signed   By: KUlyses JarredM.D.   On: 04/17/2021 22:09   MR Angiogram Neck W or Wo Contrast  Result Date: 04/17/2021 CLINICAL  DATA:  Headache and dizziness with neck pain EXAM: MRI HEAD WITHOUT AND WITH CONTRAST MRA HEAD WITHOUT CONTRAST MRA NECK WITHOUT AND WITH CONTRAST TECHNIQUE: Multiplanar, multiecho pulse sequences of the brain and surrounding structures were obtained without and with intravenous contrast. Angiographic images of the Circle of Willis were obtained using MRA technique without intravenous contrast. Angiographic images of the neck were obtained using MRA technique without and with intravenous contrast. Carotid stenosis measurements (when applicable) are obtained utilizing NASCET criteria, using the distal internal carotid diameter as the denominator. CONTRAST:  114mGADAVIST GADOBUTROL 1 MMOL/ML IV SOLN COMPARISON:  None. FINDINGS: MRI HEAD FINDINGS Brain: No acute infarct, mass effect or extra-axial collection. Multiple chronic microhemorrhages in a predominantly peripheral distribution. There is  multifocal hyperintense T2-weighted signal within the white matter. Parenchymal volume and CSF spaces are normal. The midline structures are normal. There is no abnormal contrast enhancement. Vascular: Major flow voids are preserved. Skull and upper cervical spine: Normal calvarium and skull base. Visualized upper cervical spine and soft tissues are normal. Sinuses/Orbits:No paranasal sinus fluid levels or advanced mucosal thickening. No mastoid or middle ear effusion. Normal orbits. MRA HEAD FINDINGS POSTERIOR CIRCULATION: --Vertebral arteries: Normal --Inferior cerebellar arteries: Normal. --Basilar artery: Normal. --Superior cerebellar arteries: Normal. --Posterior cerebral arteries: Normal. ANTERIOR CIRCULATION: --Intracranial internal carotid arteries: Normal. --Anterior cerebral arteries (ACA): Normal. --Middle cerebral arteries (MCA): Normal. ANATOMIC VARIANTS: Both posterior communicating arteries are patent MRA NECK FINDINGS No vertebral or carotid artery abnormality. IMPRESSION: 1. No acute intracranial abnormality.  2. Normal MRA of the head and neck. 3. Multifocal chronic microhemorrhages in a predominantly peripheral distribution. Electronically Signed   By: Ulyses Jarred M.D.   On: 04/17/2021 22:09   MR Brain W and Wo Contrast  Result Date: 04/17/2021 CLINICAL DATA:  Headache and dizziness with neck pain EXAM: MRI HEAD WITHOUT AND WITH CONTRAST MRA HEAD WITHOUT CONTRAST MRA NECK WITHOUT AND WITH CONTRAST TECHNIQUE: Multiplanar, multiecho pulse sequences of the brain and surrounding structures were obtained without and with intravenous contrast. Angiographic images of the Circle of Willis were obtained using MRA technique without intravenous contrast. Angiographic images of the neck were obtained using MRA technique without and with intravenous contrast. Carotid stenosis measurements (when applicable) are obtained utilizing NASCET criteria, using the distal internal carotid diameter as the denominator. CONTRAST:  97m GADAVIST GADOBUTROL 1 MMOL/ML IV SOLN COMPARISON:  None. FINDINGS: MRI HEAD FINDINGS Brain: No acute infarct, mass effect or extra-axial collection. Multiple chronic microhemorrhages in a predominantly peripheral distribution. There is multifocal hyperintense T2-weighted signal within the white matter. Parenchymal volume and CSF spaces are normal. The midline structures are normal. There is no abnormal contrast enhancement. Vascular: Major flow voids are preserved. Skull and upper cervical spine: Normal calvarium and skull base. Visualized upper cervical spine and soft tissues are normal. Sinuses/Orbits:No paranasal sinus fluid levels or advanced mucosal thickening. No mastoid or middle ear effusion. Normal orbits. MRA HEAD FINDINGS POSTERIOR CIRCULATION: --Vertebral arteries: Normal --Inferior cerebellar arteries: Normal. --Basilar artery: Normal. --Superior cerebellar arteries: Normal. --Posterior cerebral arteries: Normal. ANTERIOR CIRCULATION: --Intracranial internal carotid arteries: Normal. --Anterior  cerebral arteries (ACA): Normal. --Middle cerebral arteries (MCA): Normal. ANATOMIC VARIANTS: Both posterior communicating arteries are patent MRA NECK FINDINGS No vertebral or carotid artery abnormality. IMPRESSION: 1. No acute intracranial abnormality. 2. Normal MRA of the head and neck. 3. Multifocal chronic microhemorrhages in a predominantly peripheral distribution. Electronically Signed   By: KUlyses JarredM.D.   On: 04/17/2021 22:09   CT CHEST ABDOMEN PELVIS WO CONTRAST  Result Date: 04/17/2021 CLINICAL DATA:  Elevated white count. Vomiting, dizziness and fatigue. EXAM: CT CHEST, ABDOMEN AND PELVIS WITHOUT CONTRAST TECHNIQUE: Multidetector CT imaging of the chest, abdomen and pelvis was performed following the standard protocol without IV contrast. COMPARISON:  None. FINDINGS: CT CHEST FINDINGS Cardiovascular: Heart size is normal. No visible coronary artery calcification or aortic atherosclerotic calcification. Mediastinum/Nodes: No mass or lymphadenopathy. Lungs/Pleura: No pleural effusion. No pulmonary infiltrate or collapse. No mass or nodule. No emphysema or interstitial lung disease. Musculoskeletal: Bridging osteophytes throughout the mid to lower thoracic region suggesting diffuse idiopathic skeletal hyperostosis. CT ABDOMEN PELVIS FINDINGS Hepatobiliary: Normal Pancreas: Normal Spleen: Normal Adrenals/Urinary Tract: Adrenal glands are normal. Kidneys are normal without contrast. Bladder is normal. Stomach/Bowel:  Stomach and small intestine are normal. Normal appendix. No abnormal colon finding. Vascular/Lymphatic: Minimal aortic atherosclerosis. No aneurysm. IVC is normal. No adenopathy. Reproductive: Normal Other: No free fluid or air. Musculoskeletal: Ordinary lower lumbar degenerative changes. IMPRESSION: No acute finding to explain the clinical presentation. Normal evaluation of the chest without contrast. No acutely significant abdominal or pelvic finding. Aortic atherosclerosis, mild.  Bridging osteophytes in the thoracic region suggesting diffuse idiopathic skeletal hyperostosis. Lower lumbar degenerative disc disease. Electronically Signed   By: Nelson Chimes M.D.   On: 04/17/2021 14:42        Scheduled Meds:  amLODipine  10 mg Oral Daily   aspirin EC  81 mg Oral Daily   carvedilol  25 mg Oral BID WC   Chlorhexidine Gluconate Cloth  6 each Topical Daily   cholecalciferol  2,000 Units Oral Daily   cloNIDine  0.2 mg Oral BID   heparin  5,000 Units Subcutaneous Q8H   insulin aspart  0-5 Units Subcutaneous QHS   insulin aspart  0-9 Units Subcutaneous TID WC   insulin aspart  8 Units Subcutaneous TID WC   insulin glargine  20 Units Subcutaneous Daily   insulin starter kit- pen needles  1 kit Other Once   living well with diabetes book   Does not apply Once   losartan  100 mg Oral Daily   Continuous Infusions:   LOS: 1 day    Time spent: 32 minutes, more than 50% time involved in direct patient care.    Sharen Hones, MD [facility]   To contact the attending provider between 7A-7P or the covering provider during after hours 7P-7A, please log into the web site [email or web address] and access using universal [place] password for that web site. If you do not have the password, please call the hospital operator.  04/18/2021, 9:44 AM

## 2021-04-18 NOTE — Progress Notes (Signed)
Critical care note:  Date of note: 04/18/2021  Subjective: The patient had new onset tonic-clonic seizure followed by significant agitation and combativeness with high risk for fall.  Security was called.  Patient was placed on four-point restraints.  He was given 1 mg of IV Versed per E link consult.  I ordered 2 mg of IV Ativan.  He was significantly restless during the interview and later was responding to questions and following commands.  Objective: Physical examination: Generally: Acutely ill middle-aged restless, initially confused and lethargic African-American male in no acute respiratory distress. Vital signs earlier showed blood pressure 170/98 with heart rate 102 and respirate of 21 and pulse ox 90 was 94% on room air.  He was briefly placed on high percent nonrebreather. Head - atraumatic, normocephalic.  Pupils - equal, round and reactive to light and accommodation. Extraocular movements are intact. No scleral icterus.  Oropharynx - moist mucous membranes and tongue. No pharyngeal erythema or exudate.  Neck - supple. No JVD. Carotid pulses 2+ bilaterally. No carotid bruits. No palpable thyromegaly or lymphadenopathy. Cardiovascular - regular rate and rhythm. Normal S1 and S2. No murmurs, gallops or rubs.  Lungs - clear to auscultation bilaterally.  Abdomen - soft and nontender. Positive bowel sounds. No palpable organomegaly or masses.  Extremities - no pitting edema, clubbing or cyanosis.  Neuro -he was having mild left facial droop and right gaze preference.  Muscle strength was 5/5 in the right upper extremity compared to 3/5 in the left upper extremity and 4/5 in both lower extremities.  Gait was not tested.  Normal sensory exam to light touch.  The patient was having tonic-clonic seizures and was postictal earlier. Skin - no rashes. GU and rectal exam - deferred.   Labs and notes were reviewed.  Assessment/plan: 1.  New onset tonic-clonic seizure in the setting of  hypertensive emergency with multifocal chronic microhemorrhages in a predominantly peripheral distribution with no acute intracranial normality and normal MRA of the head and neck on an earlier study. - The patient was ordered 2 mg of IV Ativan followed by as needed doses. - I ordered IV Keppra. - The patient may need to be intubated for adequate sedation and airway protection for further radiographic studies to rule out acute new onset CVA possibly from extension of his hemorrhage with differential diagnosis including ischemic stroke. - Intensivist consult was obtained.  Case was discussed.  Authorized and performed by: Valente David, MD Total critical care time: Approximately   35    minutes. Due to a high probability of clinically significant, life-threatening deterioration, the patient required my highest level of preparedness to intervene emergently and I personally spent this critical care time directly and personally managing the patient.  This critical care time included obtaining a history, examining the patient, pulse oximetry, ordering and review of studies, arranging urgent treatment with development of management plan, evaluation of patient's response to treatment, frequent reassessment, and discussions with other providers. This critical care time was performed to assess and manage the high probability of imminent, life-threatening deterioration that could result in multiorgan failure.  It was exclusive of separately billable procedures and treating other patients and teaching time.  Please see MDM section and the rest of the note for further information on patient assessment and treatment.

## 2021-04-18 NOTE — Progress Notes (Addendum)
Inpatient Diabetes Program Recommendations  AACE/ADA: New Consensus Statement on Inpatient Glycemic Control (2015)  Target Ranges:  Prepandial:   less than 140 mg/dL      Peak postprandial:   less than 180 mg/dL (1-2 hours)      Critically ill patients:  140 - 180 mg/dL   Lab Results  Component Value Date   GLUCAP 356 (H) 04/18/2021    Review of Glycemic Control  Diabetes history: DM 2 Outpatient Diabetes medications: Metformin 500 mg bid Current orders for Inpatient glycemic control:  Lantus 20 units Novolog 0-9 units tid + hs Novolog 8 units tid  Inpatient Diabetes Program Recommendations:    -  Increase Lantus to 35 units (0.3 units/kg)  Will see pt to discussed insulin at home. Has insurance  Insulin pen Insulin pen needles order #[id number] Blood glucose meter kit order # [id number]  Spoke with pt and wife at bedside. Pt reports taking metformin consistently except in the past 2 days due to N/V symptoms. Pt does not check his glucose at home. Has been on insulin 10 years ago while living in [place] vial and syringe.  Spoke with pt and wife about possible need for insulin at this time. Showed them how to operate the insulin pen. Pt already is familiar with how and where to inject insulin.   Thanks,  Tama Headings RN, MSN, BC-ADM Inpatient Diabetes Coordinator Team Pager [phone] (8a-5p)

## 2021-04-18 NOTE — Progress Notes (Signed)
eLink Physician-Brief Progress Note Patient Name: Clayton [name] DOB: 1971/07/22 MRN: [id number]   Date of Service  04/18/2021  HPI/Events of Note  Seizure in setting of HTN emergency. No longer seizing. No post ictal and combative.   eICU Interventions  Plan: Versed 1 mg IV X 1 now.  Further management per Hospitalist service.  Consider Neurology consultation.      Intervention Category Major Interventions: Arrhythmia - evaluation and management;Seizures - evaluation and management  [name] 04/18/2021, 9:43 PM

## 2021-04-18 NOTE — Consult Note (Signed)
NAME:  Clayton [name], MRN:  [id number], DOB:  1971-07-24, LOS: 1 ADMISSION DATE:  04/17/2021, CONSULTATION DATE:  04/18/21 REFERRING MD:  Dr. Arville Care, CHIEF COMPLAINT:   Headache and vomiting  History of Present Illness:  50 year old male presented to [facility] ED on 04/17/2021 due to headache for the last 2 days.  Per ED documentation patient described being ill and in bed for the last 2 days, unable to take his blood pressure medicine.  During this time his headache has been worsening described as severe and throbbing, aggravated by sitting up.  He also endorsed nausea/vomiting, lightheadedness and right-sided lateral neck pain. ED course: Patient was found to be significantly hypertensive with a BP of 238/120.  BP was refractory to labetalol IV 20 mg x 2, and placed on a nicardipine drip.  All other vital signs WNL.  Due to lactic acidosis and leukocytosis patient received empiric antibiotics: Vancomycin cefepime and Flagyl. Significant labs: Leukocytosis at 17.3, lactic acidosis at 2.8, BUN/Cr: 30/2.37 (close to baseline not, glucose 441. TRH consulted for admission to stepdown due to nicardipine drip.  Patient transitioned off nicardipine drip on 04/18/2021.  But overnight had new onset seizure-like activity.  Patient became extremely combative during postictal state requiring security intervention and four-point nonviolent restraints.  As patient's mental status improved, concern for focal neurodeficits developed and code stroke called.  PCCM consulted due to initial concern for emergent intubation because of sedation needs & airway protection.  Pertinent  Medical History  HTN Poorly controlled T2DM CKD stage IIIb  Significant Hospital Events: Including procedures, antibiotic start and stop dates in addition to other pertinent events   04/17/2021-patient admitted to stepdown with hypertensive urgency on nicardipine drip 04/18/2021-patient transitioned off nicardipine drip, downgraded to PCU status.   Overnight however had new onset seizure-like activity, and became combative during suspected postictal state requiring security intervention and four-point nonviolent restraints.  Code stroke called due to focal neurodeficits post seizure.  CT head negative.  Interim History / Subjective:  Patient lethargic but responsive to voice, lethargic but able to perform simple commands.  Mild left facial droop noted & right gaze preference with very slight weaker left hand grip.  Challenging to assess vision, unclear if patient unable to see or unable to follow direction. NIHSS: 1A: Level of Consciousness - Arouses to minor stimulation + 1 1B: Ask Month and Age - Both Questions Right + 0 1C: Blink Eyes & Squeeze Hands - Performs Both Tasks + 0 2: Test Horizontal Extraocular Movements - R gaze deviation that can be overcome +1 3: Test Visual Fields - No Visual Loss + 0 4: Test Facial Palsy (Use Grimace if Obtunded) - Minor paralysis (flat nasolabial fold, smile asymmetry) + 1 5A: Test Left Arm Motor Drift - No Drift for 10 Seconds + 0 5B: Test Right Arm Motor Drift - No Drift for 10 Seconds + 0 6A: Test Left Leg Motor Drift - No Drift for 5 Seconds + 0 6B: Test Right Leg Motor Drift - No Drift for 5 Seconds + 0 7: Test Limb Ataxia (FNF/Heel-Shin) - No Ataxia + 0 8: Test Sensation - Normal; No sensory loss + 0 9: Test Language/Aphasia - Normal; No aphasia + 0 10: Test Dysarthria - Normal + 0 11: Test Extinction/Inattention - No abnormality + 0   NIHSS Score: 3 Objective   Blood pressure (!) 156/103, pulse 61, temperature 98.5 F (36.9 C), temperature source Oral, resp. rate 13, height 5\' 11"  (1.803 m), weight 119.3 kg, SpO2  96 %.        Intake/Output Summary (Last 24 hours) at 04/18/2021 2242 Last data filed at 04/18/2021 2100 Gross per 24 hour  Intake 3322.7 ml  Output --  Net 3322.7 ml   Filed Weights   04/17/21 2778 04/17/21 2300  Weight: 119.3 kg 119.3 kg    Examination: General: Adult  male, critically ill, lying in bed, lethargic but responsive- NAD HEENT: MM pink/moist, anicteric, atraumatic, neck supple Neuro: A&O x 4, able to follow simple commands with persistence, PERRL +3, MAE CV: s1s2 RRR, NSR on monitor, no r/m/g Pulm: Regular, non labored on  2 L [place], breath sounds clear-BUL & diminished-BLL GI: soft, rounded, non tender, bs x 4 Skin: no rashes/lesions noted Extremities: warm/dry, pulses + 2 R/P, no edema noted  Resolved Hospital Problem list   Hypertensive urgency- patient off nicardipine drip  Assessment & Plan:  Suspected acute Seizure activity - new onset in the setting of HTN Acute CVA? MRI showed multifocal chronic microhemorrhages in peripheral distribution-6/30 Normal MRA imaging. New focal deficits: L facial droop, R gaze preference. Patient received versed 1mg  & ativan 2 mg during severe agitation in suspected post ictal state. Discussed case with Tele neurology, will obtain CTH first > then assess for further imaging PRN. - CTH without contrast STAT - patient loaded with Keppra 1500 mg, followed by 500 mg BID - Ativan PRN for seizure like activity - continue non-violent restraints to maintain patient safety, discontinue as able per protocol - seizure & aspiration precautions - Q 4 neuro checks - EEG ordered for AM - consulted neurology, appreciate input  Hypertension - transitioned clonidine to transdermal 0.3 patch as patient is unable to take PO - continue amlodipine, carvedilol, losartan > if unable to take PO in AM will need to consider NGT - no c/o headaches at this time  Uncontrolled Type 2 Diabetes Mellitus Hemoglobin A1C: 12.0 - increased lantus to 35 units daily per DC recommendations - Monitor CBG AC, HS - SSI sensitive dosing with 8 units standing TID with meals - target range while in ICU: 140-180 - follow ICU hyper/hypo-glycemia protocol - Diabetes Coordinator following  Best Practice (right click and "Reselect all SmartList  Selections" daily)  Diet/type: Regular consistency (see orders) DVT prophylaxis: prophylactic heparin  GI prophylaxis: N/A Lines: N/A Foley:  N/A Code Status:  full code Last date of multidisciplinary goals of care discussion: 04/17/21  Labs   CBC: Recent Labs  Lab 04/17/21 0940 04/18/21 0440  WBC 17.3* 12.5*  HGB 16.1 14.9  HCT 46.0 41.8  MCV 89.7 90.5  PLT 252 230    Basic Metabolic Panel: Recent Labs  Lab 04/17/21 0940 04/18/21 0440  NA 132* 130*  K 4.3 3.6  CL 88* 97*  CO2 28 25  GLUCOSE 441* 364*  BUN 30* 35*  CREATININE 2.37* 2.56*  CALCIUM 9.8 8.5*   GFR: Estimated Creatinine Clearance: 46.4 mL/min (A) (by C-G formula based on SCr of 2.56 mg/dL (H)). Recent Labs  Lab 04/17/21 0940 04/17/21 1218 04/17/21 1522 04/17/21 1638 04/17/21 1945 04/17/21 2215 04/18/21 0440  PROCALCITON <0.10  --   --   --   --   --   --   WBC 17.3*  --   --   --   --   --  12.5*  LATICACIDVEN  --    < > 3.0* 2.9* 3.7* 2.5*  --    < > = values in this interval not displayed.    Liver  Function Tests: No results for input(s): AST, ALT, ALKPHOS, BILITOT, PROT, ALBUMIN in the last 168 hours. Recent Labs  Lab 04/17/21 0940  LIPASE 27   No results for input(s): AMMONIA in the last 168 hours.  ABG    Component Value Date/Time   HCO3 23.2 04/17/2021 1218   ACIDBASEDEF 2.6 (H) 04/17/2021 1218   O2SAT 66.5 04/17/2021 1218     Coagulation Profile: Recent Labs  Lab 04/17/21 0940  INR 1.0    Cardiac Enzymes: No results for input(s): CKTOTAL, CKMB, CKMBINDEX, TROPONINI in the last 168 hours.  HbA1C: No results found for: HGBA1C  CBG: Recent Labs  Lab 04/18/21 0319 04/18/21 0739 04/18/21 1133 04/18/21 1626 04/18/21 2157  GLUCAP 385* 356* 267* 98 252*    Review of Systems: Positives in BOLD  Gen: Denies fever, chills, weight change, fatigue, night sweats HEENT: Denies blurred vision, double vision, hearing loss, tinnitus, sinus congestion, rhinorrhea, sore  throat, neck stiffness, dysphagia PULM: Denies shortness of breath, cough, sputum production, hemoptysis, wheezing CV: Denies chest pain, edema, orthopnea, paroxysmal nocturnal dyspnea, palpitations GI: Denies abdominal pain, nausea, vomiting, diarrhea, hematochezia, melena, constipation, change in bowel habits GU: Denies dysuria, hematuria, polyuria, oliguria, urethral discharge Endocrine: Denies hot or cold intolerance, polyuria, polyphagia or appetite change Derm: Denies rash, dry skin, scaling or peeling skin change Heme: Denies easy bruising, bleeding, bleeding gums Neuro: Denies headache, numbness, weakness, slurred speech, loss of memory or consciousness **limited by patient's lethargy** Past Medical History:  He,  has a past medical history of CKD (chronic kidney disease), stage III (HCC), Hypertension, and Type II diabetes mellitus with renal manifestations (HCC).   Surgical History:   Past Surgical History:  Procedure Laterality Date   CATARACT EXTRACTION       Social History:   reports that he has never smoked. He has never used smokeless tobacco. He reports current alcohol use. He reports previous drug use.   Family History:  His family history includes Hypertension in his father and mother; Stroke in his mother.   Allergies No Known Allergies   Home Medications  Prior to Admission medications   Medication Sig Start Date End Date Taking? Authorizing Provider  amLODipine (NORVASC) 10 MG tablet Take 10 mg by mouth daily. 04/11/21  Yes [provider]  carvedilol (COREG) 25 MG tablet Take 25 mg by mouth 2 (two) times daily. 04/11/21  Yes [provider]  carvedilol (COREG) 25 MG tablet Take 25 mg by mouth in the morning and at bedtime. 10/31/20 10/31/21 Yes [provider]  chlorthalidone (HYGROTON) 25 MG tablet Take 25 mg by mouth daily. 04/11/21  Yes [provider]  Cholecalciferol 50 MCG (2000 UT) TABS Take by mouth. 09/01/20  Yes  [provider]  losartan (COZAAR) 100 MG tablet Take 100 mg by mouth daily. 04/11/21  Yes [provider]  metFORMIN (GLUCOPHAGE-XR) 500 MG 24 hr tablet Take 500 mg by mouth 2 (two) times daily. 04/11/21  Yes [provider]  tadalafil (CIALIS) 20 MG tablet Take by mouth. 10/31/20  Yes [provider]  Vitamin D, Ergocalciferol, (DRISDOL) 1.25 MG (50000 UNIT) CAPS capsule Take 50,000 Units by mouth once a week. 10/31/20  Yes [provider]  BESIVANCE 0.6 % SUSP Place 1 drop into the right eye 3 (three) times daily. Patient not taking: Reported on 04/17/2021 01/09/21   [provider]  DUREZOL 0.05 % EMUL Place 1 drop into the right eye 4 (four) times daily. Patient not taking: Reported  on 04/17/2021 01/09/21   [provider]  PROLENSA 0.07 % SOLN Place 1 drop into the left eye at bedtime. Patient not taking: Reported on 04/17/2021 11/26/20   [provider]     Critical care time: 58 minutes       Betsey Holiday, AGACNP-BC Acute Care Nurse Practitioner [facility]   [phone] / [phone] Please see Amion for pager details.

## 2021-04-18 NOTE — Progress Notes (Signed)
PHARMACY - PHYSICIAN COMMUNICATION CRITICAL VALUE ALERT - BLOOD CULTURE IDENTIFICATION (BCID)  Clayton [name] is an 50 y.o. male who presented to [facility] on 04/17/2021 with a chief complaint of dizziness, neck pain, vomiting related to hypertension emergency.    Assessment:  6/30 blood cultures with GPC in 1 of 4 bottles, BCID = MSSE  Name of physician (or Provider) Contacted: Dr Chipper Herb  Current antibiotics: none  Changes to prescribed antibiotics recommended:  Recommendations accepted by provider - monitor off antibiotics.  Likely contaminant  Results for orders placed or performed during the hospital encounter of 04/17/21  Blood Culture ID Panel (Reflexed) (Collected: 04/17/2021  4:38 PM)  Result Value Ref Range   Enterococcus faecalis NOT DETECTED NOT DETECTED   Enterococcus Faecium NOT DETECTED NOT DETECTED   Listeria monocytogenes NOT DETECTED NOT DETECTED   Staphylococcus species DETECTED (A) NOT DETECTED   Staphylococcus aureus (BCID) NOT DETECTED NOT DETECTED   Staphylococcus epidermidis DETECTED (A) NOT DETECTED   Staphylococcus lugdunensis NOT DETECTED NOT DETECTED   Streptococcus species NOT DETECTED NOT DETECTED   Streptococcus agalactiae NOT DETECTED NOT DETECTED   Streptococcus pneumoniae NOT DETECTED NOT DETECTED   Streptococcus pyogenes NOT DETECTED NOT DETECTED   A.calcoaceticus-baumannii NOT DETECTED NOT DETECTED   Bacteroides fragilis NOT DETECTED NOT DETECTED   Enterobacterales NOT DETECTED NOT DETECTED   Enterobacter cloacae complex NOT DETECTED NOT DETECTED   Escherichia coli NOT DETECTED NOT DETECTED   Klebsiella aerogenes NOT DETECTED NOT DETECTED   Klebsiella oxytoca NOT DETECTED NOT DETECTED   Klebsiella pneumoniae NOT DETECTED NOT DETECTED   Proteus species NOT DETECTED NOT DETECTED   Salmonella species NOT DETECTED NOT DETECTED   Serratia marcescens NOT DETECTED NOT DETECTED   Haemophilus influenzae NOT DETECTED NOT DETECTED   Neisseria  meningitidis NOT DETECTED NOT DETECTED   Pseudomonas aeruginosa NOT DETECTED NOT DETECTED   Stenotrophomonas maltophilia NOT DETECTED NOT DETECTED   Candida albicans NOT DETECTED NOT DETECTED   Candida auris NOT DETECTED NOT DETECTED   Candida glabrata NOT DETECTED NOT DETECTED   Candida krusei NOT DETECTED NOT DETECTED   Candida parapsilosis NOT DETECTED NOT DETECTED   Candida tropicalis NOT DETECTED NOT DETECTED   Cryptococcus neoformans/gattii NOT DETECTED NOT DETECTED   Methicillin resistance mecA/C NOT DETECTED NOT DETECTED    Juliette Alcide, PharmD, BCPS.   Work Cell: [phone] 04/18/2021 3:24 PM

## 2021-04-18 NOTE — Consult Note (Signed)
[facility] TeleNeurology Consult Services   Date of Service:   04/18/2021 22:18:25  Diagnosis:       R56.9 - Seizures  Impression:      Clayton [name] is a 50 year old male who is evaluated for his left sided weakness following a seizure. He is continuing to wake but is now sedated. His extremity strength and movement is equal but he still has some slight facial weakness. His NIH is 2 and his head CT shows no acute changes. TPA is not indicated. I reviewed the findings with this attending physician. The plan will be to continue the Keppra at 1000 BID. He will then undergo and EEG and MRI in the morning.  Metrics: Last Known Well: 04/18/2021 21:30:00 [facility] Notification Time: 04/18/2021 22:16:32 Stamp Time: 04/18/2021 22:18:25 Initial Response Time: 04/18/2021 23:06:57 Symptoms: Seizure and left sided weakness. NIHSS Start Assessment Time: 04/18/2021 23:16:32 Patient is not a candidate for Thrombolytic. Thrombolytic Medical Decision: 04/18/2021 23:12:00 Patient was not deemed candidate for Thrombolytic because of following reasons: No disabling symptoms.  CT head showed no acute hemorrhage or acute core infarct.  Consulting physician Notified of Diagnostic Impression and Management Plan: 04/18/2021 23:31:33 Able to Reach 04/18/2021 23:31:33  Advanced Imaging: Advanced Imaging Not Recommended because:  Clinical presentation is not suggestion of LVO or Low clinical suspicion of LVO based on presentation   Our recommendations are outlined below.  Recommendations:        Stroke/Telemetry Floor       Neuro Checks       Bedside Swallow Eval       DVT Prophylaxis       IV Fluids, Normal Saline       Head of Bed 30 Degrees       Euglycemia and Avoid Hyperthermia (PRN Acetaminophen)  Routine Consultation with Inhouse Neurology for Follow up Care  Sign Out:       Discussed with Primary  Attending    ------------------------------------------------------------------------------  History of Present Illness: Patient is a 50 year old Male.  Inpatient stroke alert was called for symptoms of Seizure and left sided weakness.  Mr. Clayton [name] is a 50 year old male with HTN, DM and CKD who was admitted on 6/30 for a hypertensive urgency. He has been in the ICU for the nicardipine drip which was stopped this morning. Tonight he had a GTC seizure and following the seizure he had slurred speech and weakness on his left side. He has been loaded with Keppra and has also received Ativan and Versed. He wakes to voice and is able to follow commands but he falls asleep quickly. He is now moving all 4 extremities equally but he still has slight left facial weakness.   Past Medical History:      Hypertension      Diabetes Mellitus  Social History:Unable to obtain due to Patient Status  Family History:      HTN, stroke  Review of System:  14 Points Review of Systems was performed and was negative except mentioned in HPI.  Anticoagulant use:  No  Antiplatelet use: No  Allergies:  Unable to Obtain    Examination: BP(145/78), Pulse(72), 1A: Level of Consciousness - Arouses to minor stimulation + 1 1B: Ask Month and Age - Both Questions Right + 0 1C: Blink Eyes & Squeeze Hands - Performs Both Tasks + 0 2: Test Horizontal Extraocular Movements - Normal + 0 3: Test Visual Fields - No Visual Loss + 0 4: Test Facial Palsy (Use Grimace  if Obtunded) - Minor paralysis (flat nasolabial fold, smile asymmetry) + 1 5A: Test Left Arm Motor Drift - No Drift for 10 Seconds + 0 5B: Test Right Arm Motor Drift - No Drift for 10 Seconds + 0 6A: Test Left Leg Motor Drift - No Drift for 5 Seconds + 0 6B: Test Right Leg Motor Drift - No Drift for 5 Seconds + 0 7: Test Limb Ataxia (FNF/Heel-Shin) - No Ataxia + 0 8: Test Sensation - Normal; No sensory loss + 0 9: Test Language/Aphasia - Normal; No  aphasia + 0 10: Test Dysarthria - Normal + 0 11: Test Extinction/Inattention - No abnormality + 0  NIHSS Score: 2   Pre-Morbid Modified Rankin Scale: 0 Points = No symptoms at all   Patient/Family was informed the Neurology Consult would occur via TeleHealth consult by way of interactive audio and video telecommunications and consented to receiving care in this manner.   Patient is being evaluated for possible acute neurologic impairment and high probability of imminent or life-threatening deterioration. I spent total of 35 minutes providing care to this patient, including time for face to face visit via telemedicine, review of medical records, imaging studies and discussion of findings with providers, the patient and/or family.   Dr Gonzella Lex   [facility] [phone]  Case [id number]

## 2021-04-19 ENCOUNTER — Inpatient Hospital Stay (HOSPITAL_COMMUNITY)
Admit: 2021-04-19 | Discharge: 2021-04-19 | Disposition: A | Discharge status: Home / Self Care | Payer: 59 | Attending: Pulmonary Disease | Admitting: Pulmonary Disease

## 2021-04-19 ENCOUNTER — Inpatient Hospital Stay: Payer: 59

## 2021-04-19 DIAGNOSIS — R338 Other retention of urine: Secondary | ICD-10-CM

## 2021-04-19 DIAGNOSIS — I674 Hypertensive encephalopathy: Secondary | ICD-10-CM

## 2021-04-19 DIAGNOSIS — E871 Hypo-osmolality and hyponatremia: Secondary | ICD-10-CM

## 2021-04-19 DIAGNOSIS — R7881 Bacteremia: Secondary | ICD-10-CM | POA: Diagnosis not present

## 2021-04-19 DIAGNOSIS — R569 Unspecified convulsions: Secondary | ICD-10-CM

## 2021-04-19 DIAGNOSIS — I161 Hypertensive emergency: Secondary | ICD-10-CM | POA: Diagnosis not present

## 2021-04-19 LAB — CBC WITH DIFFERENTIAL/PLATELET
Abs Immature Granulocytes: 0.06 10*3/uL (ref 0.00–0.07)
Basophils Absolute: 0.1 10*3/uL (ref 0.0–0.1)
Basophils Relative: 0 %
Eosinophils Absolute: 0.2 10*3/uL (ref 0.0–0.5)
Eosinophils Relative: 2 %
HCT: 39.3 % (ref 39.0–52.0)
Hemoglobin: 13.8 g/dL (ref 13.0–17.0)
Immature Granulocytes: 1 %
Lymphocytes Relative: 16 %
Lymphs Abs: 2 10*3/uL (ref 0.7–4.0)
MCH: 31.7 pg (ref 26.0–34.0)
MCHC: 35.1 g/dL (ref 30.0–36.0)
MCV: 90.1 fL (ref 80.0–100.0)
Monocytes Absolute: 0.9 10*3/uL (ref 0.1–1.0)
Monocytes Relative: 7 %
Neutro Abs: 9.1 10*3/uL — ABNORMAL HIGH (ref 1.7–7.7)
Neutrophils Relative %: 74 %
Platelets: 215 10*3/uL (ref 150–400)
RBC: 4.36 MIL/uL (ref 4.22–5.81)
RDW: 11.7 % (ref 11.5–15.5)
WBC: 12.3 10*3/uL — ABNORMAL HIGH (ref 4.0–10.5)
nRBC: 0 % (ref 0.0–0.2)

## 2021-04-19 LAB — GLUCOSE, CAPILLARY
Glucose-Capillary: 141 mg/dL — ABNORMAL HIGH (ref 70–99)
Glucose-Capillary: 142 mg/dL — ABNORMAL HIGH (ref 70–99)
Glucose-Capillary: 160 mg/dL — ABNORMAL HIGH (ref 70–99)
Glucose-Capillary: 277 mg/dL — ABNORMAL HIGH (ref 70–99)
Glucose-Capillary: 77 mg/dL (ref 70–99)
Glucose-Capillary: 99 mg/dL (ref 70–99)

## 2021-04-19 LAB — URINALYSIS, COMPLETE (UACMP) WITH MICROSCOPIC
Bilirubin Urine: NEGATIVE
Glucose, UA: >=500 mg/dL — AB
Ketones, ur: 5 mg/dL — AB
Leukocytes,Ua: NEGATIVE
Nitrite: NEGATIVE
Protein, ur: 100 mg/dL — AB
Specific Gravity, Urine: 1.017 (ref 1.005–1.030)
pH: 5 (ref 5.0–8.0)

## 2021-04-19 LAB — HEMOGLOBIN A1C
Hgb A1c MFr Bld: 12 % — ABNORMAL HIGH (ref 4.8–5.6)
Mean Plasma Glucose: 298 mg/dL

## 2021-04-19 LAB — BASIC METABOLIC PANEL
Anion gap: 8 (ref 5–15)
BUN: 36 mg/dL — ABNORMAL HIGH (ref 6–20)
CO2: 26 mmol/L (ref 22–32)
Calcium: 8.5 mg/dL — ABNORMAL LOW (ref 8.9–10.3)
Chloride: 99 mmol/L (ref 98–111)
Creatinine, Ser: 2.57 mg/dL — ABNORMAL HIGH (ref 0.61–1.24)
GFR, Estimated: 30 mL/min — ABNORMAL LOW (ref >60)
Glucose, Bld: 218 mg/dL — ABNORMAL HIGH (ref 70–99)
Potassium: 4.1 mmol/L (ref 3.5–5.1)
Sodium: 133 mmol/L — ABNORMAL LOW (ref 135–145)

## 2021-04-19 LAB — MAGNESIUM: Magnesium: 2.3 mg/dL (ref 1.7–2.4)

## 2021-04-19 IMAGING — MR MR HEAD WO/W CM
13 of 15 series · 37 of 48 positions shown · IV contrast (gadavist)
Comparison: [DATE]

CLINICAL DATA: Seizure, left-sided weakness

EXAM:
MRI HEAD WITHOUT AND WITH CONTRAST
TECHNIQUE: Multiplanar, multiecho pulse sequences of the brain and surrounding
structures were obtained without and with intravenous contrast.
CONTRAST:  10mL GADAVIST GADOBUTROL 1 MMOL/ML IV SOLN

[Series 5: ax dwi_tracew · axial · 3.0mm · 0.65mm/px · z∈[-47,+106]mm · 2 of 48 slices shown]
[im 1/48]
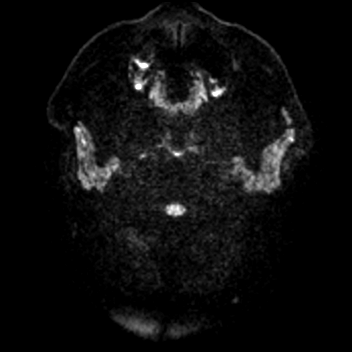
[im 48/48]
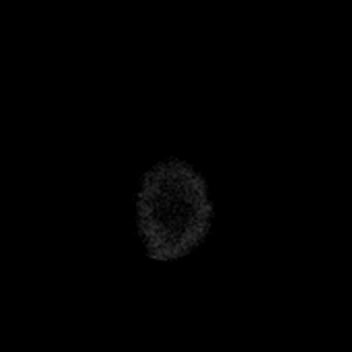

[Series 6: ax dwi_adc · axial · 3.0mm · 0.65mm/px · z∈[-47,+106]mm · 2 of 48 slices shown]
[im 1/48]
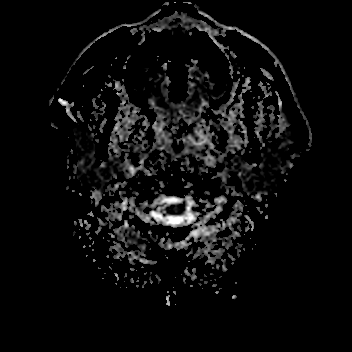
[im 48/48]
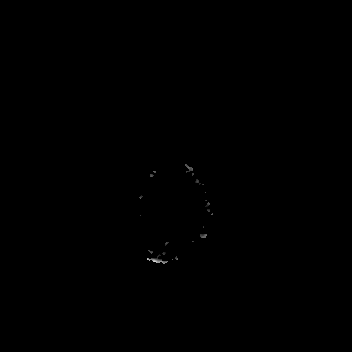

[Series 7: cor dwi_tracew · coronal · 5.0mm · 0.68mm/px · 2 of 40 slices shown]
[im 1/40]
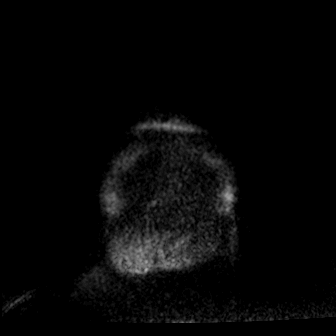
[im 40/40]
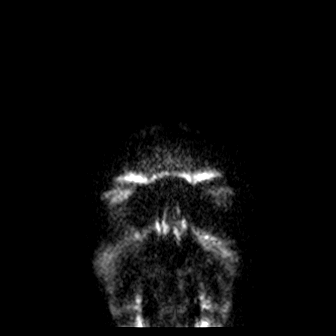

[Series 8: cor dwi_adc · coronal · 5.0mm · 0.68mm/px · 2 of 40 slices shown]
[im 1/40]
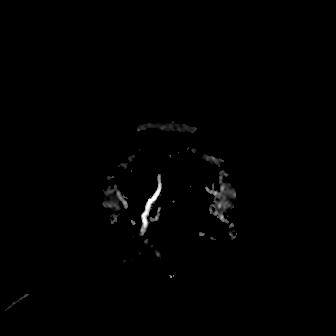
[im 40/40]
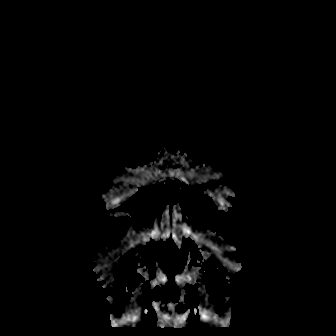

[Series 9: T1 · sagittal · 5.0mm · 0.62mm/px · 1 of 25 slices shown (1 of 2)]
[im 1/25]
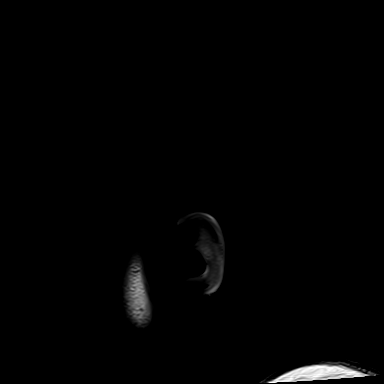

[Series 11: pha_images · axial · 3.0mm · 0.90mm/px · z∈[-58,+28]mm · 2 of 59 slices shown]
[im 1/59]
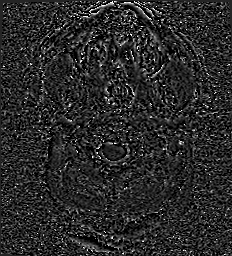
[im 30/59]
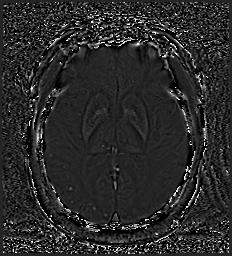

[Series 14: FLAIR · axial · 3.0mm · 0.53mm/px · z∈[-51,+108]mm · 3 of 55 slices shown (1 of 2)]
[im 1/55]
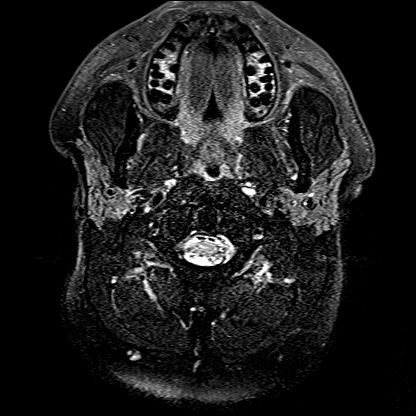
[im 28/55]
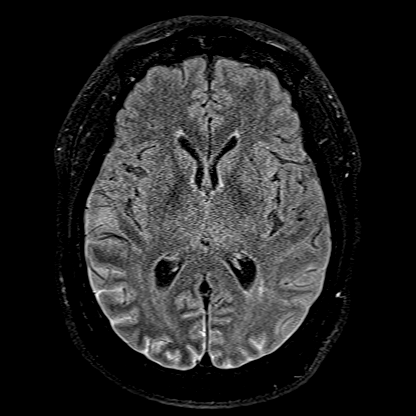
[im 55/55]
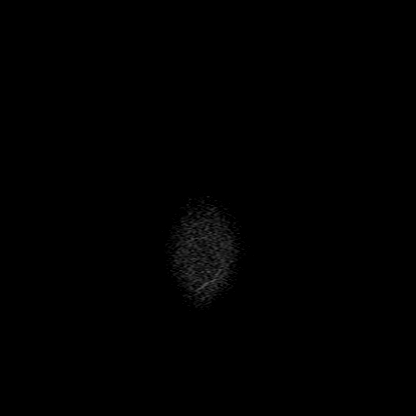

[Series 15: T2 · axial · 5.0mm · 0.45mm/px · 1 of 27 slices shown (1 of 2)]
[im 1/27]
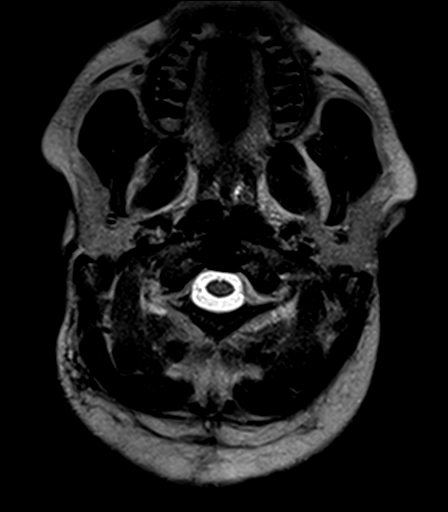

[Series 16: T1 · axial · 1.0mm · 0.98mm/px · z∈[-55,+117]mm · 8 of 176 slices shown (2 of 2)]
[im 1/176]
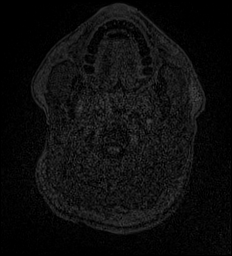
[im 22/176]
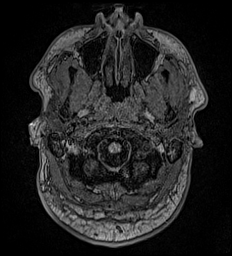
[im 44/176]
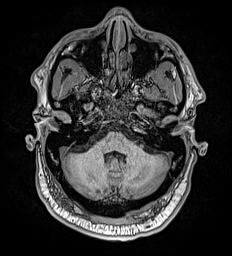
[im 66/176]
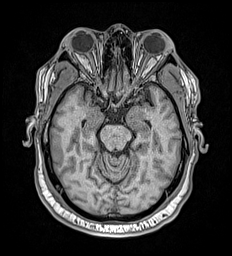
[im 110/176]
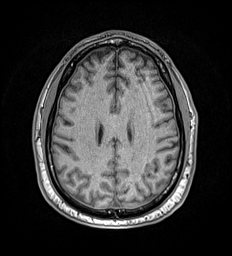
[im 132/176]
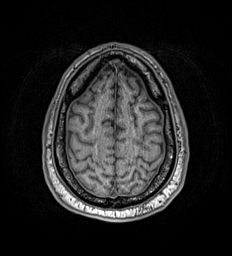
[im 154/176]
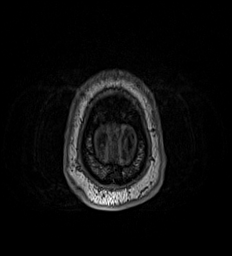
[im 176/176]
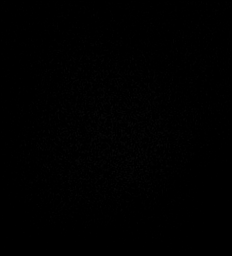

[Series 17: T2 · coronal · 3.0mm · 0.23mm/px · 2 of 35 slices shown (2 of 2)]
[im 1/35]
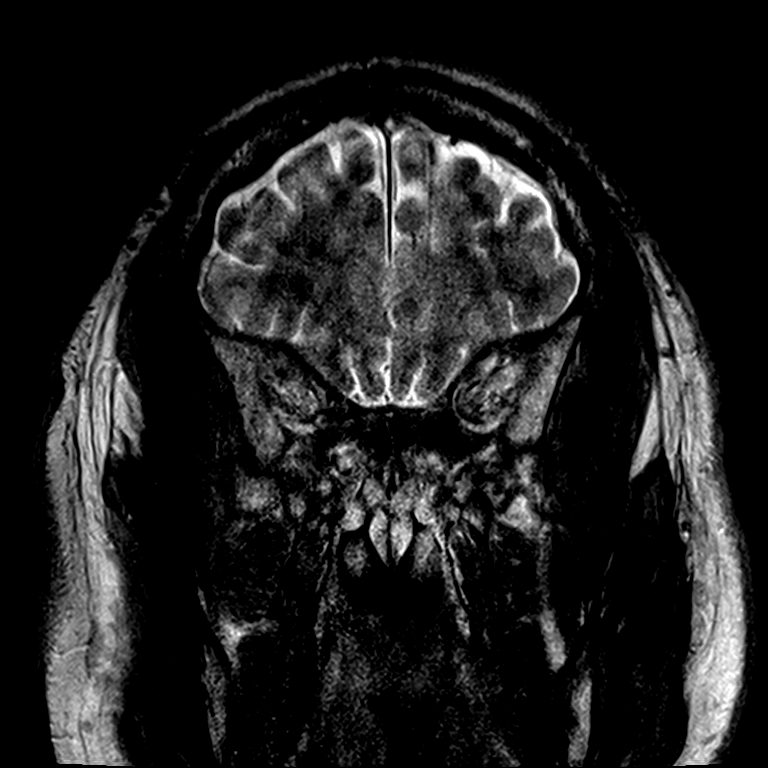
[im 35/35]
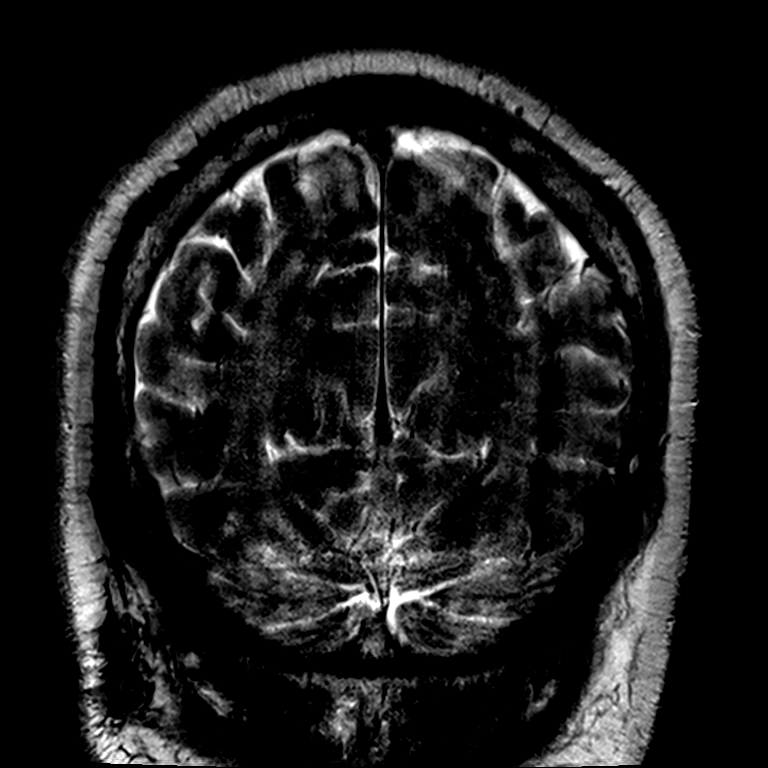

[Series 18: FLAIR · coronal · 3.0mm · 0.35mm/px · 2 of 35 slices shown (2 of 2)]
[im 1/35]
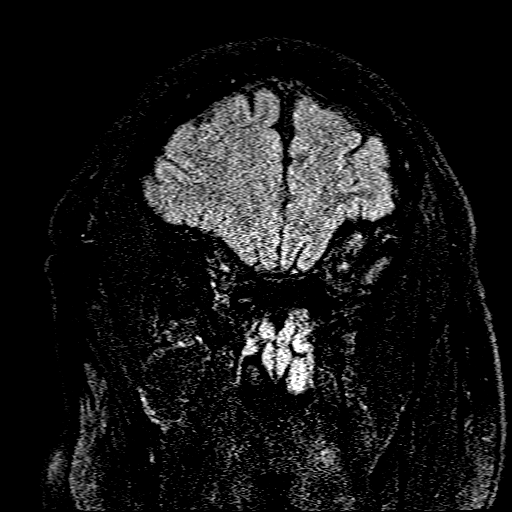
[im 35/35]
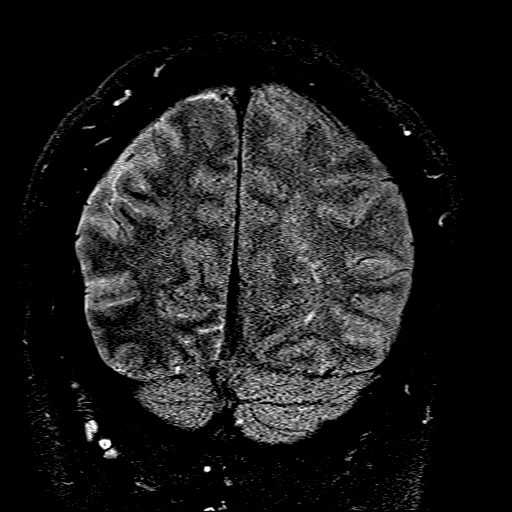

[Series 19: T1 post-contrast · axial · 1.0mm · 0.98mm/px · z∈[-55,+117]mm · 9 of 176 slices shown (1 of 2)]
[im 1/176]
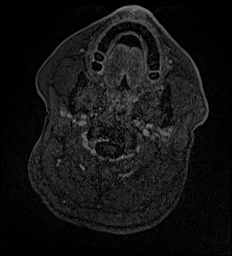
[im 22/176]
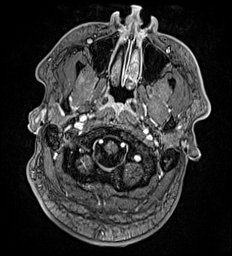
[im 44/176]
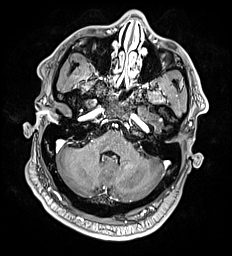
[im 66/176]
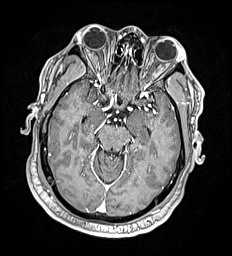
[im 88/176]
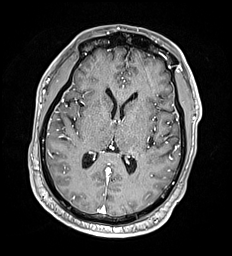
[im 110/176]
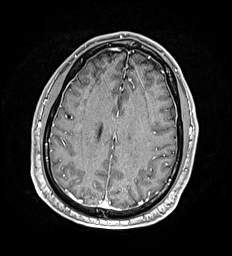
[im 132/176]
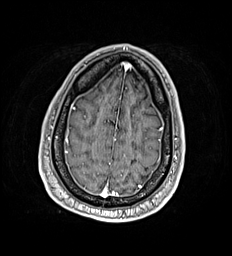
[im 154/176]
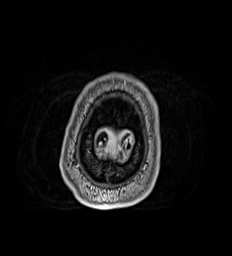
[im 176/176]
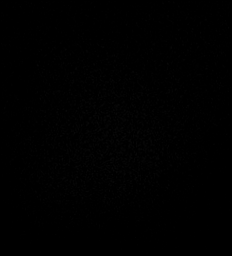

[Series 20: T1 post-contrast · coronal · 5.0mm · 0.57mm/px · 1 of 29 slices shown (2 of 2)]
[im 1/29]
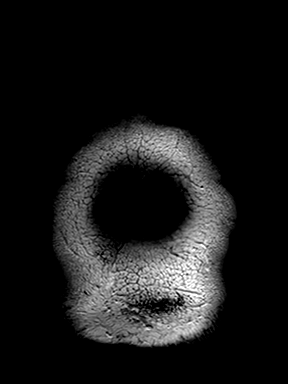

[37 of 48 positions shown; findings below may reference images not displayed]

FINDINGS: Brain: There is no acute infarction or intracranial hemorrhage.
There is no intracranial mass, mass effect, or edema. There is no
hydrocephalus or extra-axial fluid collection. Ventricles and sulci
are normal in size and configuration.

There are numerous foci of susceptibility involving the cerebral
white matter, central gray nuclei, brainstem, and cerebellum.

Patchy foci of T2 hyperintensity in the supratentorial white matter
nonspecific but may reflect mild chronic microvascular ischemic
changes.

There is abnormal sulcal T2 FLAIR hyperintensity. No abnormal
leptomeningeal enhancement. No abnormal parenchymal enhancement.

Vascular: Major vessel flow voids at the skull base are preserved.

Skull and upper cervical spine: Normal marrow signal is preserved.

Sinuses/Orbits: Paranasal sinuses are aerated. Bilateral lens
replacements.

Other: Sella is unremarkable.  Mastoid air cells are clear.
IMPRESSION: Abnormal sulcal T2 FLAIR hyperintensity suggesting a leptomeningeal
process. Although there is no corresponding abnormal diffusion or
enhancement, consider meningitis given positive blood cultures.

Unchanged numerous foci of susceptibility. May reflect chronic
microhemorrhages secondary to chronic hypertension. Embolic material
is also a consideration. Correlate with echocardiography.

## 2021-04-19 MED ORDER — GADOBUTROL 1 MMOL/ML IV SOLN
10.0000 mL | Freq: Once | INTRAVENOUS | Status: AC | PRN
  Administered 2021-04-19: 10 mL via INTRAVENOUS

## 2021-04-19 MED ORDER — SODIUM CHLORIDE 0.9 % IV SOLN
2.0000 g | Freq: Two times a day (BID) | INTRAVENOUS | Status: DC
  Administered 2021-04-19 – 2021-04-21 (×5): 2 g via INTRAVENOUS
  Filled 2021-04-19 (×2): qty 20
  Filled 2021-04-19: qty 2
  Filled 2021-04-19 (×4): qty 20
  Filled 2021-04-19: qty 2
  Filled 2021-04-19: qty 20

## 2021-04-19 MED ORDER — AMLODIPINE BESYLATE 5 MG PO TABS
5.0000 mg | ORAL_TABLET | Freq: Every day | ORAL | Status: DC
  Administered 2021-04-19 – 2021-04-21 (×3): 5 mg via ORAL
  Filled 2021-04-19 (×3): qty 1

## 2021-04-19 MED ORDER — CEFAZOLIN SODIUM-DEXTROSE 2-4 GM/100ML-% IV SOLN
2.0000 g | Freq: Three times a day (TID) | INTRAVENOUS | Status: DC
  Administered 2021-04-19: 2 g via INTRAVENOUS
  Filled 2021-04-19 (×4): qty 100

## 2021-04-19 MED ORDER — TAMSULOSIN HCL 0.4 MG PO CAPS
0.4000 mg | ORAL_CAPSULE | Freq: Every day | ORAL | Status: DC
  Administered 2021-04-19 – 2021-04-25 (×7): 0.4 mg via ORAL
  Filled 2021-04-19 (×7): qty 1

## 2021-04-19 MED ORDER — INSULIN ASPART 100 UNIT/ML IJ SOLN
4.0000 [IU] | Freq: Three times a day (TID) | INTRAMUSCULAR | Status: DC
  Administered 2021-04-19 (×2): 4 [IU] via SUBCUTANEOUS
  Filled 2021-04-19: qty 1

## 2021-04-19 MED ORDER — INSULIN GLARGINE 100 UNIT/ML ~~LOC~~ SOLN
35.0000 [IU] | Freq: Every day | SUBCUTANEOUS | Status: DC
  Administered 2021-04-19: 35 [IU] via SUBCUTANEOUS
  Filled 2021-04-19 (×2): qty 0.35

## 2021-04-19 MED ORDER — HYDRALAZINE HCL 20 MG/ML IJ SOLN
10.0000 mg | INTRAMUSCULAR | Status: DC | PRN
  Administered 2021-04-19 – 2021-04-22 (×3): 20 mg via INTRAVENOUS
  Filled 2021-04-19 (×3): qty 1

## 2021-04-19 NOTE — Progress Notes (Signed)
Patient is now more alert and able to follow commands. Some residual left sided weakness noted but improved from previous assessments. Patient has not voided since condom cath applied. Bladder scan performed showed of residual. Patient refused catheter. Britton-Lee, NP made aware. Encouraged void. Will continue to monitor.

## 2021-04-19 NOTE — Progress Notes (Signed)
Received phone call from telemetry about patient HR being elevated. Monitor picking up mostly artifact. Went in to assess patient and he appeared post ictal. Moderate amount of sputum observed coming from right corner of mouth, BP reading 85/53 where he had previously been hypertensive, pt unresponsive to sternal rub, muscle rigidity, incontinent of bladder. E-link contacted during event and NP called to the bedside. 1mg  Versed given per Elink, Hospitalist called to bedside. Patient very disoriented and combative post seizure. Orders received. Will continue to monitor.

## 2021-04-19 NOTE — Progress Notes (Signed)
Neuro assessmentf re-attempted. Unable to complete due to patient mentation at this time.

## 2021-04-19 NOTE — Consult Note (Signed)
NEURO HOSPITALIST CONSULT NOTE   Requestig physician: Dr. Chipper Herb  Reason for Consult: Seizures  History obtained from:  Patient and Chart     HPI:                                                                                                                                          Clayton [name] is an 50 y.o. male with a PMHx of HTN, DM2 and CKD who was admitted on 6/30 for hypertensive urgency in the setting of severe headache with neck pain. Symptoms began abruptly on Tuesday night after a meal at a restaurant with his wife to celebrate their anniversary. He started to feel sick to his stomach on the way home and that night he became so nauseous that he started vomiting. He states that while vomiting, he had abrupt onset of a 10/10 headache along with posterior neck pain a few minutes later. He went to bed and stayed at home during his day off the next day, continuing to feel ill with a severe headache and nause. His nausea was severe enough to prevent him from taking his BP medicine.  On Thursday his headache pain and nausea became unbearable, so he presented to the hospital for evaluation. He was also complaining of dizziness at that time. Severe hypertension was noted. No neck stiffness was noted on EDP exam, which also noted the patient to be alert and oriented, with normal speech and no gross focal neurological deficits. CT head was normal. EKG showed NSR. BP was 229/125.    He was admitted to the ICU for further management. He has been in the ICU on nicardipine drip which was stopped on Friday morning. On Friday night he had a GTC seizure, following which he had slurred speech and left sided weakness. He was loaded with Keppra and also received Ativan and Versed. He was assessed by Teleneurology and on exam the patient would wake to voice and was able to follow commands but would fall asleep quickly. He was able to move all 4 extremities equally but slight left facial  weakness was noted. Head CT showed no acute changes. IV tPA was not indicated per Teleneurology. The plan was to continue Keppra at 1000 mg BID and obtain and repeat MRI as well as EEG.   MRI brain reveals numerous foci of susceptibility involving the cerebral white matter, central gray nuclei, brainstem, and cerebellum. Patchy foci of T2 hyperintensity in the supratentorial white matter are nonspecific but may reflect mild chronic microvascular ischemic changes. There is abnormal sulcal T2 FLAIR hyperintensity. No abnormal leptomeningeal enhancement. No abnormal parenchymal enhancement. No acute infarction is appreciated.   Past Medical History:  Diagnosis Date   CKD (chronic kidney disease), stage III (HCC)    Hypertension  Type II diabetes mellitus with renal manifestations [facility])     Past Surgical History:  Procedure Laterality Date   CATARACT EXTRACTION      Family History  Problem Relation Age of Onset   Stroke Mother    Hypertension Mother    Hypertension Father             Social History:  reports that he has never smoked. He has never used smokeless tobacco. He reports current alcohol use. He reports previous drug use.  No Known Allergies  MEDICATIONS:                                                                                                                     Scheduled:  amLODipine  5 mg Oral Daily   aspirin EC  81 mg Oral Daily   carvedilol  25 mg Oral BID WC   Chlorhexidine Gluconate Cloth  6 each Topical Daily   cholecalciferol  2,000 Units Oral Daily   cloNIDine  0.3 mg Transdermal Weekly   heparin  5,000 Units Subcutaneous Q8H   insulin aspart  0-5 Units Subcutaneous QHS   insulin aspart  0-9 Units Subcutaneous TID WC   insulin aspart  4 Units Subcutaneous TID WC   insulin glargine  35 Units Subcutaneous Daily   losartan  100 mg Oral Daily   tamsulosin  0.4 mg Oral Daily   Continuous:  sodium chloride 75 mL/hr at 04/19/21 1900   cefTRIAXone  (ROCEPHIN)  IV     levETIRAcetam Stopped (04/19/21 1006)     ROS:                                                                                                                                       As per HPI.    Blood pressure (!) 141/91, pulse 64, temperature 98.1 F (36.7 C), temperature source Oral, resp. rate 13, height 5\' 11"  (1.803 m), weight 119.3 kg, SpO2 100 %.   General Examination:  Physical Exam  HEENT-  [place]/AT  Lungs: Respirations unlabored Ext: No edema.     Neurological Examination Mental Status: Mildly drowsy and confused. Oriented to the city, state, circumstance, month and year, but not the day of the week. Speech is fluent with intact comprehension and naming. No dysarthria.  Cranial Nerves: II: Visual fields intact in all 4 quadrants of each eye. No extinction to DSS. PERRL. Able to identify all objects. Tracks and fixates normally.   III,IV, VI: No ptosis. EOMI with mild saccadic quality to pursuits. No nystagmus.  V,VII: Temp sensation equal bilaterally. Smile symmetric.  VIII: Hearing intact to voice IX,X: No hypophonia XI: Symmetric shoulder shrug XII: Midline tongue extension Motor: Right : Upper extremity   5/5    Left:     Upper extremity   5/5  Lower extremity   5/5     Lower extremity   5/5 Normal tone throughout; no atrophy noted Sensory: Temp and light touch intact throughout, bilaterally Deep Tendon Reflexes: 2+ and symmetric throughout. No Hoffman's sign. Plantars: Right: downgoing   Left: downgoing Cerebellar: No ataxia with FNF bilaterally.  Gait: Deferred   Lab Results: Basic Metabolic Panel: Recent Labs  Lab 04/17/21 0940 04/18/21 0440 04/19/21 0416  NA 132* 130* 133*  K 4.3 3.6 4.1  CL 88* 97* 99  CO2 28 25 26   GLUCOSE 441* 364* 218*  BUN 30* 35* 36*  CREATININE 2.37* 2.56* 2.57*  CALCIUM 9.8 8.5* 8.5*  MG  --   --  2.3     CBC: Recent Labs  Lab 04/17/21 0940 04/18/21 0440 04/19/21 0416  WBC 17.3* 12.5* 12.3*  NEUTROABS  --   --  9.1*  HGB 16.1 14.9 13.8  HCT 46.0 41.8 39.3  MCV 89.7 90.5 90.1  PLT 252 230 215    Cardiac Enzymes: No results for input(s): CKTOTAL, CKMB, CKMBINDEX, TROPONINI in the last 168 hours.  Lipid Panel: Recent Labs  Lab 04/18/21 0440  CHOL 198  TRIG 275*  HDL 43  CHOLHDL 4.6  VLDL 55*  LDLCALC 100*    Imaging: MR ANGIO HEAD WO CONTRAST  Result Date: 04/17/2021 CLINICAL DATA:  Headache and dizziness with neck pain EXAM: MRI HEAD WITHOUT AND WITH CONTRAST MRA HEAD WITHOUT CONTRAST MRA NECK WITHOUT AND WITH CONTRAST TECHNIQUE: Multiplanar, multiecho pulse sequences of the brain and surrounding structures were obtained without and with intravenous contrast. Angiographic images of the Circle of Willis were obtained using MRA technique without intravenous contrast. Angiographic images of the neck were obtained using MRA technique without and with intravenous contrast. Carotid stenosis measurements (when applicable) are obtained utilizing NASCET criteria, using the distal internal carotid diameter as the denominator. CONTRAST:  110mL GADAVIST GADOBUTROL 1 MMOL/ML IV SOLN COMPARISON:  None. FINDINGS: MRI HEAD FINDINGS Brain: No acute infarct, mass effect or extra-axial collection. Multiple chronic microhemorrhages in a predominantly peripheral distribution. There is multifocal hyperintense T2-weighted signal within the white matter. Parenchymal volume and CSF spaces are normal. The midline structures are normal. There is no abnormal contrast enhancement. Vascular: Major flow voids are preserved. Skull and upper cervical spine: Normal calvarium and skull base. Visualized upper cervical spine and soft tissues are normal. Sinuses/Orbits:No paranasal sinus fluid levels or advanced mucosal thickening. No mastoid or middle ear effusion. Normal orbits. MRA HEAD FINDINGS POSTERIOR  CIRCULATION: --Vertebral arteries: Normal --Inferior cerebellar arteries: Normal. --Basilar artery: Normal. --Superior cerebellar arteries: Normal. --Posterior cerebral arteries: Normal. ANTERIOR CIRCULATION: --Intracranial internal carotid arteries: Normal. --Anterior cerebral arteries (ACA): Normal. --  Middle cerebral arteries (MCA): Normal. ANATOMIC VARIANTS: Both posterior communicating arteries are patent MRA NECK FINDINGS No vertebral or carotid artery abnormality. IMPRESSION: 1. No acute intracranial abnormality. 2. Normal MRA of the head and neck. 3. Multifocal chronic microhemorrhages in a predominantly peripheral distribution. Electronically Signed   By: Deatra RobinsonKevin  Herman M.D.   On: 04/17/2021 22:09   MR Angiogram Neck W or Wo Contrast  Result Date: 04/17/2021 CLINICAL DATA:  Headache and dizziness with neck pain EXAM: MRI HEAD WITHOUT AND WITH CONTRAST MRA HEAD WITHOUT CONTRAST MRA NECK WITHOUT AND WITH CONTRAST TECHNIQUE: Multiplanar, multiecho pulse sequences of the brain and surrounding structures were obtained without and with intravenous contrast. Angiographic images of the Circle of Willis were obtained using MRA technique without intravenous contrast. Angiographic images of the neck were obtained using MRA technique without and with intravenous contrast. Carotid stenosis measurements (when applicable) are obtained utilizing NASCET criteria, using the distal internal carotid diameter as the denominator. CONTRAST:  10mL GADAVIST GADOBUTROL 1 MMOL/ML IV SOLN COMPARISON:  None. FINDINGS: MRI HEAD FINDINGS Brain: No acute infarct, mass effect or extra-axial collection. Multiple chronic microhemorrhages in a predominantly peripheral distribution. There is multifocal hyperintense T2-weighted signal within the white matter. Parenchymal volume and CSF spaces are normal. The midline structures are normal. There is no abnormal contrast enhancement. Vascular: Major flow voids are preserved. Skull and upper  cervical spine: Normal calvarium and skull base. Visualized upper cervical spine and soft tissues are normal. Sinuses/Orbits:No paranasal sinus fluid levels or advanced mucosal thickening. No mastoid or middle ear effusion. Normal orbits. MRA HEAD FINDINGS POSTERIOR CIRCULATION: --Vertebral arteries: Normal --Inferior cerebellar arteries: Normal. --Basilar artery: Normal. --Superior cerebellar arteries: Normal. --Posterior cerebral arteries: Normal. ANTERIOR CIRCULATION: --Intracranial internal carotid arteries: Normal. --Anterior cerebral arteries (ACA): Normal. --Middle cerebral arteries (MCA): Normal. ANATOMIC VARIANTS: Both posterior communicating arteries are patent MRA NECK FINDINGS No vertebral or carotid artery abnormality. IMPRESSION: 1. No acute intracranial abnormality. 2. Normal MRA of the head and neck. 3. Multifocal chronic microhemorrhages in a predominantly peripheral distribution. Electronically Signed   By: Deatra RobinsonKevin  Herman M.D.   On: 04/17/2021 22:09   MR BRAIN W WO CONTRAST  Result Date: 04/19/2021 CLINICAL DATA:  Seizure, left-sided weakness EXAM: MRI HEAD WITHOUT AND WITH CONTRAST TECHNIQUE: Multiplanar, multiecho pulse sequences of the brain and surrounding structures were obtained without and with intravenous contrast. CONTRAST:  10mL GADAVIST GADOBUTROL 1 MMOL/ML IV SOLN COMPARISON:  04/17/2021 FINDINGS: Brain: There is no acute infarction or intracranial hemorrhage. There is no intracranial mass, mass effect, or edema. There is no hydrocephalus or extra-axial fluid collection. Ventricles and sulci are normal in size and configuration. There are numerous foci of susceptibility involving the cerebral white matter, central gray nuclei, brainstem, and cerebellum. Patchy foci of T2 hyperintensity in the supratentorial white matter nonspecific but may reflect mild chronic microvascular ischemic changes. There is abnormal sulcal T2 FLAIR hyperintensity. No abnormal leptomeningeal enhancement. No  abnormal parenchymal enhancement. Vascular: Major vessel flow voids at the skull base are preserved. Skull and upper cervical spine: Normal marrow signal is preserved. Sinuses/Orbits: Paranasal sinuses are aerated. Bilateral lens replacements. Other: Sella is unremarkable.  Mastoid air cells are clear. 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 Although there is no corresponding abnormal diffusion or enhancement, consider meningitis given positive blood cultures. Unchanged numerous foci of susceptibility. May reflect chronic microhemorrhages secondary to chronic hypertension. Embolic material is also a consideration. Correlate with echocardiography. Electronically Signed   By: Jackquline BerlinPraneil  Patel M.D.  On: 04/19/2021 15:08   MR Brain W and Wo Contrast  Result Date: 04/17/2021 CLINICAL DATA:  Headache and dizziness with neck pain EXAM: MRI HEAD WITHOUT AND WITH CONTRAST MRA HEAD WITHOUT CONTRAST MRA NECK WITHOUT AND WITH CONTRAST TECHNIQUE: Multiplanar, multiecho pulse sequences of the brain and surrounding structures were obtained without and with intravenous contrast. Angiographic images of the Circle of Willis were obtained using MRA technique without intravenous contrast. Angiographic images of the neck were obtained using MRA technique without and with intravenous contrast. Carotid stenosis measurements (when applicable) are obtained utilizing NASCET criteria, using the distal internal carotid diameter as the denominator. CONTRAST:  65mL GADAVIST GADOBUTROL 1 MMOL/ML IV SOLN COMPARISON:  None. FINDINGS: MRI HEAD FINDINGS Brain: No acute infarct, mass effect or extra-axial collection. Multiple chronic microhemorrhages in a predominantly peripheral distribution. There is multifocal hyperintense T2-weighted signal within the white matter. Parenchymal volume and CSF spaces are normal. The midline structures are normal. There is no abnormal contrast enhancement. Vascular:  Major flow voids are preserved. Skull and upper cervical spine: Normal calvarium and skull base. Visualized upper cervical spine and soft tissues are normal. Sinuses/Orbits:No paranasal sinus fluid levels or advanced mucosal thickening. No mastoid or middle ear effusion. Normal orbits. MRA HEAD FINDINGS POSTERIOR CIRCULATION: --Vertebral arteries: Normal --Inferior cerebellar arteries: Normal. --Basilar artery: Normal. --Superior cerebellar arteries: Normal. --Posterior cerebral arteries: Normal. ANTERIOR CIRCULATION: --Intracranial internal carotid arteries: Normal. --Anterior cerebral arteries (ACA): Normal. --Middle cerebral arteries (MCA): Normal. ANATOMIC VARIANTS: Both posterior communicating arteries are patent MRA NECK FINDINGS No vertebral or carotid artery abnormality. IMPRESSION: 1. No acute intracranial abnormality. 2. Normal MRA of the head and neck. 3. Multifocal chronic microhemorrhages in a predominantly peripheral distribution. Electronically Signed   By: Deatra Robinson M.D.   On: 04/17/2021 22:09   CT HEAD CODE STROKE WO CONTRAST`  Result Date: 04/18/2021 CLINICAL DATA:  Code stroke.  Seizure EXAM: CT HEAD WITHOUT CONTRAST TECHNIQUE: Contiguous axial images were obtained from the base of the skull through the vertex without intravenous contrast. COMPARISON:  None. FINDINGS: Brain: There is no mass, hemorrhage or extra-axial collection. The size and configuration of the ventricles and extra-axial CSF spaces are normal. The brain parenchyma is normal, without evidence of acute or chronic infarction. Vascular: No abnormal hyperdensity of the major intracranial arteries or dural venous sinuses. No intracranial atherosclerosis. Skull: The visualized skull base, calvarium and extracranial soft tissues are normal. Sinuses/Orbits: No fluid levels or advanced mucosal thickening of the visualized paranasal sinuses. No mastoid or middle ear effusion. The orbits are normal. ASPECTS [facility] Stroke Program  Early CT Score) - Ganglionic level infarction (caudate, lentiform nuclei, internal capsule, insula, M1-M3 cortex): 7 - Supraganglionic infarction (M4-M6 cortex): 3 Total score (0-10 with 10 being normal): 10 IMPRESSION: 1. Normal head CT. 2. ASPECTS is 10. These results were called by telephone at the time of interpretation on 04/18/2021 at 10:53 pm to provider BRITTON RUST-CHESTER , who verbally acknowledged these results. Electronically Signed   By: Deatra Robinson M.D.   On: 04/18/2021 22:53     Assessment: 50 year old male with new onset of GTC seizure overnight in the context of initial admission for hypertensive urgency with severe headache and nausea after abrupt onset of the headache during a spell of vomiting on Tuesday night.  1. Exam reveals a drowsy patient with mild confusion. No aphasia, ataxia, focal motor or sensory deficits are noted. No nuchal rigidity is appreciated.  2. Initial MRI obtained on 6/30 showed no acute  intracranial abnormality. Multifocal chronic microhemorrhages in a predominantly peripheral distribution were appreciated, involving the brain parenchyma supratentorially and infratentorially. MRA of the head and neck obtained on the same date was normal, with no aneurysm or dissection seen.  3. Repeat MRI obtained today reveals interval development of abnormal sulcal T2 FLAIR hyperintensity adjacent to the occipital lobes in a symmetric distribution, with no corresponding abnormal leptomeningeal enhancement and appearing most consistent with proteinaceous fluid versus artifact. Again seen were numerous foci of susceptibility involving the cerebral white matter, central gray nuclei, brainstem, and cerebellum.  4. MRI personally reviewed. The sulcal signal abnormality is felt most likely to represent subarachnoid blood given patient's description of onset of a thunderclap headache while vomiting on Tuesday. The abnormal sulcal signal is not felt likely to represent a meningitis as  there is no associated enhancement and onset of the patient's headache was abrupt rather than gradual. Meningitis also less likely as patient's WBC elevation is mild and he is afebrile.  5. Of note, the chronic microhemorrhages seen on MRI most likely are secondary to longstanding, severe/uncontrolled HTN. Patient denies a history of severe head trauma with only about one concussion while playing football as a youth and relatively minor other head impacts while playing the sport.    Recommendations: 1. EEG has been ordered and is pending. Will most likely not be able to obtain EEG until Tuesday as EEG tech for [facility] is not available on weekends or holidays.  2. Continue BP management.  3. Continue empiric ABX for possible meningitis.  4. CTA of head and neck to assess for possible intracranial aneurysm and/or vertebral artery dissection not detectable by MRA.  5. Stop ASA due to probable subarachnoid hemorrhage on MRI.  6. Continue scheduled Keppra for now, 1000 mg BID 7. If patient has another seizure and does not awaken from it or shows continued motor activity suggestive of status epilepticus, he will need to be loaded with a second anticonvulsant and may also need to be intubated and sedated to abort clinical seizure activity, then transferred to [facility] for STAT EEG monitoring. If he has a seizure that resolves, then reload with 2000 mg IV Keppra and increase his scheduled dose to 1500 mg IV BID.  8. Frequent neuro checks 9. Given low suspicion for meningitis, will hold off on LP for now from a risk/benefit standpoint. If CTA shows aneurysm, then LP likely not necessary to confirm presence of subarachnoid blood.  10. Discussed with ICU team.   Electronically signed: Dr. Caryl Pina 04/19/2021, 3:21 PM

## 2021-04-19 NOTE — Progress Notes (Signed)
*  PRELIMINARY RESULTS* Echocardiogram 2D Echocardiogram has been performed.  Clayton [name] S [name] [name] 04/19/2021, 2:50 PM 

## 2021-04-19 NOTE — Progress Notes (Signed)
Neuro assessment attempted but patient too lethargic to complete. Unable to keep his eyes open long enough to comply with simple commands. IV replacement for BP management requested as patient is not able to take PO at this time.

## 2021-04-19 NOTE — Progress Notes (Addendum)
PROGRESS NOTE    Clayton [name]  NWG:[id number] DOB: 23-Mar-1971 DOA: 04/17/2021 PCP: [facility]   Chief complaint: Seizure. Brief Narrative:  Clayton [name] is a 50 y.o. male with medical history significant of hypertension, diabetes mellitus, CKD stage IIIb, who presents with nausea, vomiting, headache, right-sided neck pain. Upon arriving the hospital, blood pressure was 247/115.  Patient was continued on home medicines, was also given labetalol IV push, then she was placed on nicardipine drip. MRI brain was performed, showed microhemorrhages, I discussed the case with Dr. Otelia Limes, he has reviewed the MRI images, currently there is no concern for future bleeding.  Okay to continue aspirin and a prophylactic heparin. Carotid MRA did not show any carotid dissection. 7/1.  Discontinued nicardipine, started on clonidine 0.2 mg twice a day.  Increase insulin dose. 7/2.  Patient developed myoclonic and seizure x2, seen by neurology, started Keppra.  MRI obtained.  Blood culture positive for gram-positive cocci.   Assessment & Plan:   Principal Problem:   Hypertensive emergency Active Problems:   Nausea & vomiting   Acute metabolic encephalopathy   CKD (chronic kidney disease), stage IIIb   Type II diabetes mellitus with renal manifestations (HCC)   Leukocytosis   Elevated lactic acid level   Elevated troponin   Seizure (HCC)  #1.  Hypertensive emergency. Hypertensive encephalopathy.   Elevated troponin secondary to hypertensive emergency. Intracranial microhemorrhages. Hypertension is better controlled.  Clonidine was changed to patch at 0.3 mg.  Blood pressure is close to normal, I will decrease amlodipine to 5 mg daily.  2.  Myoclonic seizure. Appreciate neurology input.  Pending MRI and EEG. Continue Keppra. As needed Ativan for seizure activity.  3.  Gram-positive cocci bacteremia. Lactic acidosis. Patient was on metformin, lactic acidosis could be  from metformin. However, patient has a positive blood culture in 4 bottles now.  He received vancomycin in the time admission, initial culture did not show any resistance, will start cefazolin.  Reculture tomorrow. Etiology of positive blood culture is unknown.  He will need a TEE potentially on Monday. Given presentation of hypertensive emergency, myoclonic seizure and positive blood culture, I need to rule out IV drug use.  Patient currently denies it.  Will obtain urine tox screen.  #4.  Urinary retention. Patient refused a Foley catheter.  We will get him set up to urinate.  Continue as needed bladder scan.  Start Flomax.  Also send UA and urine culture.  #5.  Uncontrolled type 2 diabetes with hyperglycemia Continue Lantus, scheduled NovoLog and sliding scale insulin.  6.  Chronic kidney disease stage IIIb. Hyponatremia. Continue some IV fluids.  Addendum: 1610. MRI suspects meningitis. Spoke with Dr. Ninetta Lights from ID, will obtain LP with open pressure, start rocephin. Blood culrure has identified as staph epidermis, no resistant gene.  Spoke with radiologist on call Dr. Eppie Gibson, radiology does not have coverage today for [facility], he suggested neurology should perform it. Discussed with Dr. Otelia Limes, he recommended empirically treating with antibiotics.  Researched on uptodate, steroids is only indicated for meningitis caused by streptococcus pneumoniae. No steroids will be given.  Patient will be monitored closely in ICU stepdown unit. Dr, Otelia Limes will see the patient today.   DVT prophylaxis: Heparin Code Status: full Family Communication:  Disposition Plan:    Status is: Inpatient  Remains inpatient appropriate because:Inpatient level of care appropriate due to severity of illness  Dispo: The patient is from: Home  Anticipated d/c is to: Home              Patient currently is not medically stable to d/c.   Difficult to place patient No        I/O last 3 completed  shifts: In: 4148.3 [P.O.:550; I.V.:3498.3; IV Piggyback:100] Out: -  No intake/output data recorded.     Consultants:  Neurology  Procedures: None  Antimicrobials: Cefazolin.  Subjective: Patient had a 2 episodes of myoclonic seizures, last episode is around 11 PM. He feels better today.  Headache has resolved. No fever or chills. No short of breath or cough. Denies any dysuria, had a urinary retention overnight. No abdominal pain or nausea vomiting.   Objective: Vitals:   04/19/21 0545 04/19/21 0600 04/19/21 0630 04/19/21 0800  BP: 137/81 129/80 118/70 139/83  Pulse: 63 70 65 65  Resp: 17 (!) Temp:    98.5 F (36.9 C)  TempSrc:    Oral  SpO2: 100% 99% 99% 99%  Weight:      Height:        Intake/Output Summary (Last 24 hours) at 04/19/2021 1011 Last data filed at 04/19/2021 0700 Gross per 24 hour  Intake 1551.07 ml  Output --  Net 1551.07 ml   Filed Weights   04/17/21 0927 04/17/21 2300  Weight: 119.3 kg 119.3 kg    Examination:  General exam: Appears calm and comfortable  Respiratory system: Clear to auscultation. Respiratory effort normal. Cardiovascular system: S1 & S2 heard, RRR. No JVD, murmurs, rubs, gallops or clicks. No pedal edema. Gastrointestinal system: Abdomen is nondistended, soft and nontender. No organomegaly or masses felt. Normal bowel sounds heard. Central nervous system: Alert and oriented x3. No focal neurological deficits. Extremities: Symmetric 5 x 5 power. Skin: No rashes, lesions or ulcers Psychiatry: Mood & affect appropriate.     Data Reviewed: I have personally reviewed following labs and imaging studies  CBC: Recent Labs  Lab 04/17/21 0940 04/18/21 0440 04/19/21 0416  WBC 17.3* 12.5* 12.3*  NEUTROABS  --   --  9.1*  HGB 16.1 14.9 13.8  HCT 46.0 41.8 39.3  MCV 89.7 90.5 90.1  PLT 252 230 215   Basic Metabolic Panel: Recent Labs  Lab 04/17/21 0940 04/18/21 0440 04/19/21 0416  NA 132* 130* 133*  K 4.3  3.6 4.1  CL 88* 97* 99  CO2 GLUCOSE 441* 364* 218*  BUN 30* 35* 36*  CREATININE 2.37* 2.56* 2.57*  CALCIUM 9.8 8.5* 8.5*  MG  --   --  2.3   GFR: Estimated Creatinine Clearance: 46.2 mL/min (A) (by C-G formula based on SCr of 2.57 mg/dL (H)). Liver Function Tests: No results for input(s): AST, ALT, ALKPHOS, BILITOT, PROT, ALBUMIN in the last 168 hours. Recent Labs  Lab 04/17/21 0940  LIPASE 27   No results for input(s): AMMONIA in the last 168 hours. Coagulation Profile: Recent Labs  Lab 04/17/21 0940  INR 1.0   Cardiac Enzymes: No results for input(s): CKTOTAL, CKMB, CKMBINDEX, TROPONINI in the last 168 hours. BNP (last 3 results) No results for input(s): PROBNP in the last 8760 hours. HbA1C: Recent Labs    04/18/21 0440  HGBA1C 12.0*   CBG: Recent Labs  Lab 04/18/21 1133 04/18/21 1626 04/18/21 2157 04/18/21 2359 04/19/21 0757  GLUCAP 267* 98 252* 277* 160*   Lipid Profile: Recent Labs    04/18/21 0440  CHOL 198  HDL 43  LDLCALC 100*  TRIG 275*  CHOLHDL 4.6   Thyroid Function Tests: No results for input(s): TSH, T4TOTAL, FREET4, T3FREE, THYROIDAB in the last 72 hours. Anemia Panel: No results for input(s): VITAMINB12, FOLATE, FERRITIN, TIBC, IRON, RETICCTPCT in the last 72 hours. Sepsis Labs: Recent Labs  Lab 04/17/21 0940 04/17/21 1218 04/17/21 1522 04/17/21 1638 04/17/21 1945 04/17/21 2215  PROCALCITON <0.10  --   --   --   --   --   LATICACIDVEN  --    < > 3.0* 2.9* 3.7* 2.5*   < > = values in this interval not displayed.    Recent Results (from the past 240 hour(s))  Resp Panel by RT-PCR (Flu A&B, Covid) Nasopharyngeal Swab     Status: None   Collection Time: 04/17/21  9:40 AM   Specimen: Nasopharyngeal Swab; Nasopharyngeal(NP) swabs in vial transport medium  Result Value Ref Range Status   SARS Coronavirus 2 by RT PCR NEGATIVE NEGATIVE Final    Comment: (NOTE) SARS-CoV-2 target nucleic acids are NOT DETECTED.  The  SARS-CoV-2 RNA is generally detectable in upper respiratory specimens during the acute phase of infection. The lowest concentration of SARS-CoV-2 viral copies this assay can detect is 138 copies/mL. A negative result does not preclude SARS-Cov-2 infection and should not be used as the sole basis for treatment or other patient management decisions. A negative result may occur with  improper specimen collection/handling, submission of specimen other than nasopharyngeal swab, presence of viral mutation(s) within the areas targeted by this assay, and inadequate number of viral copies(<138 copies/mL). A negative result must be combined with clinical observations, patient history, and epidemiological information. The expected result is Negative.  Fact Sheet for Patients:  BloggerCourse.com  Fact Sheet for Healthcare Providers:  SeriousBroker.it  This test is no t yet approved or cleared by the [place] FDA and  has been authorized for detection and/or diagnosis of SARS-CoV-2 by FDA under an Emergency Use Authorization (EUA). This EUA will remain  in effect (meaning this test can be used) for the duration of the COVID-19 declaration under Section 564(b)(1) of the Act, 21 U.S.C.section 360bbb-3(b)(1), unless the authorization is terminated  or revoked sooner.       Influenza A by PCR NEGATIVE NEGATIVE Final   Influenza B by PCR NEGATIVE NEGATIVE Final    Comment: (NOTE) The Xpert Xpress SARS-CoV-2/FLU/RSV plus assay is intended as an aid in the diagnosis of influenza from Nasopharyngeal swab specimens and should not be used as a sole basis for treatment. Nasal washings and aspirates are unacceptable for Xpert Xpress SARS-CoV-2/FLU/RSV testing.  Fact Sheet for Patients: BloggerCourse.com  Fact Sheet for Healthcare Providers: SeriousBroker.it  This test is not yet approved or  cleared by the [place] FDA and has been authorized for detection and/or diagnosis of SARS-CoV-2 by FDA under an Emergency Use Authorization (EUA). This EUA will remain in effect (meaning this test can be used) for the duration of the COVID-19 declaration under Section 564(b)(1) of the Act, 21 U.S.C. section 360bbb-3(b)(1), unless the authorization is terminated or revoked.  Performed at [facility], [address] [place], [place] [address]   CULTURE, BLOOD (ROUTINE X 2) w Reflex to ID Panel     Status: None (Preliminary result)   Collection Time: 04/17/21  4:34 PM   Specimen: BLOOD  Result Value Ref Range Status   Specimen Description BLOOD RIGHT ANTECUBITAL  Final   Special Requests   Final    BOTTLES DRAWN AEROBIC AND ANAEROBIC Blood Culture results may not  be optimal due to an inadequate volume of blood received in culture bottles   Culture  Setup Time   Final    GRAM POSITIVE COCCI ANAEROBIC BOTTLE ONLY CRITICAL VALUE NOTED.  VALUE IS CONSISTENT WITH PREVIOUSLY REPORTED AND CALLED VALUE. Performed at [facility], [address] [place], [place] [address]    Culture [facility] POSITIVE COCCI  Final   Report Status PENDING  Incomplete  CULTURE, BLOOD (ROUTINE X 2) w Reflex to ID Panel     Status: None (Preliminary result)   Collection Time: 04/17/21  4:38 PM   Specimen: BLOOD  Result Value Ref Range Status   Specimen Description   Final    BLOOD LEFT ANTECUBITAL Performed at [facility], [address] [place], [place] [address]    Special Requests   Final    BOTTLES DRAWN AEROBIC AND ANAEROBIC Blood Culture results may not be optimal due to an inadequate volume of blood received in culture bottles Performed at [facility], [address] [place], [place] [address]    Culture  Setup Time   Final    GRAM POSITIVE COCCI IN BOTH AEROBIC AND ANAEROBIC BOTTLES CRITICAL RESULT CALLED TO, READ BACK BY AND VERIFIED WITH: SUSAN  WATSON  ON 07.01.22.SH Performed at [facility], [address] [place], [place] [address]    Culture GRAM POSITIVE COCCI  Final   Report Status PENDING  Incomplete  Blood Culture ID Panel (Reflexed)     Status: Abnormal   Collection Time: 04/17/21  4:38 PM  Result Value Ref Range Status   Enterococcus faecalis NOT DETECTED NOT DETECTED Final   Enterococcus Faecium NOT DETECTED NOT DETECTED Final   Listeria monocytogenes NOT DETECTED NOT DETECTED Final   Staphylococcus species DETECTED (A) NOT DETECTED Final    Comment: CRITICAL RESULT CALLED TO, READ BACK BY AND VERIFIED WITH: SUSAN WATSON  ON 07.01.22.SH    Staphylococcus aureus (BCID) NOT DETECTED NOT DETECTED Final   Staphylococcus epidermidis DETECTED (A) NOT DETECTED Final    Comment: CRITICAL RESULT CALLED TO, READ BACK BY AND VERIFIED WITH: SUSAN WATSON  ON 07.01.22.SH    Staphylococcus lugdunensis NOT DETECTED NOT DETECTED Final   Streptococcus species NOT DETECTED NOT DETECTED Final   Streptococcus agalactiae NOT DETECTED NOT DETECTED Final   Streptococcus pneumoniae NOT DETECTED NOT DETECTED Final   Streptococcus pyogenes NOT DETECTED NOT DETECTED Final   A.calcoaceticus-baumannii NOT DETECTED NOT DETECTED Final   Bacteroides fragilis NOT DETECTED NOT DETECTED Final   Enterobacterales NOT DETECTED NOT DETECTED Final   Enterobacter cloacae complex NOT DETECTED NOT DETECTED Final   Escherichia coli NOT DETECTED NOT DETECTED Final   Klebsiella aerogenes NOT DETECTED NOT DETECTED Final   Klebsiella oxytoca NOT DETECTED NOT DETECTED Final   Klebsiella pneumoniae NOT DETECTED NOT DETECTED Final   Proteus species NOT DETECTED NOT DETECTED Final   Salmonella species NOT DETECTED NOT DETECTED Final   Serratia marcescens NOT DETECTED NOT DETECTED Final   Haemophilus influenzae NOT DETECTED NOT DETECTED Final   Neisseria meningitidis NOT DETECTED NOT DETECTED Final   Pseudomonas aeruginosa NOT DETECTED NOT  DETECTED Final   Stenotrophomonas maltophilia NOT DETECTED NOT DETECTED Final   Candida albicans NOT DETECTED NOT DETECTED Final   Candida auris NOT DETECTED NOT DETECTED Final   Candida glabrata NOT DETECTED NOT DETECTED Final   Candida krusei NOT DETECTED NOT DETECTED Final   Candida parapsilosis NOT DETECTED NOT DETECTED Final   Candida tropicalis NOT DETECTED NOT  DETECTED Final   Cryptococcus neoformans/gattii NOT DETECTED NOT DETECTED Final   Methicillin resistance mecA/C NOT DETECTED NOT DETECTED Final    Comment: Performed at [facility], [address] [place], [place] [address]  MRSA Next Gen by PCR, Nasal     Status: None   Collection Time: 04/18/21  1:37 AM   Specimen: Nasal Mucosa; Nasal Swab  Result Value Ref Range Status   MRSA by PCR Next Gen NOT DETECTED NOT DETECTED Final    Comment: (NOTE) The GeneXpert MRSA Assay (FDA approved for NASAL specimens only), is one component of a comprehensive MRSA colonization surveillance program. It is not intended to diagnose MRSA infection nor to guide or monitor treatment for MRSA infections. Test performance is not FDA approved in patients less than 66 years old. Performed at [facility], [address], [place], [place] [address]          Radiology Studies: CT Head Wo Contrast  Result Date: 04/17/2021 CLINICAL DATA:  Headache and dizziness. EXAM: CT HEAD WITHOUT CONTRAST TECHNIQUE: Contiguous axial images were obtained from the base of the skull through the vertex without intravenous contrast. COMPARISON:  None. FINDINGS: Brain: No evidence of acute infarction, hemorrhage, hydrocephalus, extra-axial collection or mass lesion/mass effect. Vascular: No hyperdense vessel or unexpected calcification. Skull: Normal. Negative for fracture or focal lesion. Sinuses/Orbits: No acute finding. Other: None. IMPRESSION: 1. Normal noncontrast head CT. Electronically Signed   By: Obie Dredge M.D.   On: 04/17/2021  11:57   MR ANGIO HEAD WO CONTRAST  Result Date: 04/17/2021 CLINICAL DATA:  Headache and dizziness with neck pain EXAM: MRI HEAD WITHOUT AND WITH CONTRAST MRA HEAD WITHOUT CONTRAST MRA NECK WITHOUT AND WITH CONTRAST TECHNIQUE: Multiplanar, multiecho pulse sequences of the brain and surrounding structures were obtained without and with intravenous contrast. Angiographic images of the Circle of Willis were obtained using MRA technique without intravenous contrast. Angiographic images of the neck were obtained using MRA technique without and with intravenous contrast. Carotid stenosis measurements (when applicable) are obtained utilizing NASCET criteria, using the distal internal carotid diameter as the denominator. CONTRAST:  29mL GADAVIST GADOBUTROL 1 MMOL/ML IV SOLN COMPARISON:  None. FINDINGS: MRI HEAD FINDINGS Brain: No acute infarct, mass effect or extra-axial collection. Multiple chronic microhemorrhages in a predominantly peripheral distribution. There is multifocal hyperintense T2-weighted signal within the white matter. Parenchymal volume and CSF spaces are normal. The midline structures are normal. There is no abnormal contrast enhancement. Vascular: Major flow voids are preserved. Skull and upper cervical spine: Normal calvarium and skull base. Visualized upper cervical spine and soft tissues are normal. Sinuses/Orbits:No paranasal sinus fluid levels or advanced mucosal thickening. No mastoid or middle ear effusion. Normal orbits. MRA HEAD FINDINGS POSTERIOR CIRCULATION: --Vertebral arteries: Normal --Inferior cerebellar arteries: Normal. --Basilar artery: Normal. --Superior cerebellar arteries: Normal. --Posterior cerebral arteries: Normal. ANTERIOR CIRCULATION: --Intracranial internal carotid arteries: Normal. --Anterior cerebral arteries (ACA): Normal. --Middle cerebral arteries (MCA): Normal. ANATOMIC VARIANTS: Both posterior communicating arteries are patent MRA NECK FINDINGS No vertebral or carotid  artery abnormality. IMPRESSION: 1. No acute intracranial abnormality. 2. Normal MRA of the head and neck. 3. Multifocal chronic microhemorrhages in a predominantly peripheral distribution. Electronically Signed   By: Deatra Robinson M.D.   On: 04/17/2021 22:09   MR Angiogram Neck W or Wo Contrast  Result Date: 04/17/2021 CLINICAL DATA:  Headache and dizziness with neck pain EXAM: MRI HEAD WITHOUT AND WITH CONTRAST MRA HEAD WITHOUT CONTRAST MRA NECK WITHOUT AND WITH CONTRAST TECHNIQUE:  Multiplanar, multiecho pulse sequences of the brain and surrounding structures were obtained without and with intravenous contrast. Angiographic images of the Circle of Willis were obtained using MRA technique without intravenous contrast. Angiographic images of the neck were obtained using MRA technique without and with intravenous contrast. Carotid stenosis measurements (when applicable) are obtained utilizing NASCET criteria, using the distal internal carotid diameter as the denominator. CONTRAST:  10mL GADAVIST GADOBUTROL 1 MMOL/ML IV SOLN COMPARISON:  None. FINDINGS: MRI HEAD FINDINGS Brain: No acute infarct, mass effect or extra-axial collection. Multiple chronic microhemorrhages in a predominantly peripheral distribution. There is multifocal hyperintense T2-weighted signal within the white matter. Parenchymal volume and CSF spaces are normal. The midline structures are normal. There is no abnormal contrast enhancement. Vascular: Major flow voids are preserved. Skull and upper cervical spine: Normal calvarium and skull base. Visualized upper cervical spine and soft tissues are normal. Sinuses/Orbits:No paranasal sinus fluid levels or advanced mucosal thickening. No mastoid or middle ear effusion. Normal orbits. MRA HEAD FINDINGS POSTERIOR CIRCULATION: --Vertebral arteries: Normal --Inferior cerebellar arteries: Normal. --Basilar artery: Normal. --Superior cerebellar arteries: Normal. --Posterior cerebral arteries: Normal.  ANTERIOR CIRCULATION: --Intracranial internal carotid arteries: Normal. --Anterior cerebral arteries (ACA): Normal. --Middle cerebral arteries (MCA): Normal. ANATOMIC VARIANTS: Both posterior communicating arteries are patent MRA NECK FINDINGS No vertebral or carotid artery abnormality. IMPRESSION: 1. No acute intracranial abnormality. 2. Normal MRA of the head and neck. 3. Multifocal chronic microhemorrhages in a predominantly peripheral distribution. Electronically Signed   By: Deatra Robinson M.D.   On: 04/17/2021 22:09   MR Brain W and Wo Contrast  Result Date: 04/17/2021 CLINICAL DATA:  Headache and dizziness with neck pain EXAM: MRI HEAD WITHOUT AND WITH CONTRAST MRA HEAD WITHOUT CONTRAST MRA NECK WITHOUT AND WITH CONTRAST TECHNIQUE: Multiplanar, multiecho pulse sequences of the brain and surrounding structures were obtained without and with intravenous contrast. Angiographic images of the Circle of Willis were obtained using MRA technique without intravenous contrast. Angiographic images of the neck were obtained using MRA technique without and with intravenous contrast. Carotid stenosis measurements (when applicable) are obtained utilizing NASCET criteria, using the distal internal carotid diameter as the denominator. CONTRAST:  10mL GADAVIST GADOBUTROL 1 MMOL/ML IV SOLN COMPARISON:  None. FINDINGS: MRI HEAD FINDINGS Brain: No acute infarct, mass effect or extra-axial collection. Multiple chronic microhemorrhages in a predominantly peripheral distribution. There is multifocal hyperintense T2-weighted signal within the white matter. Parenchymal volume and CSF spaces are normal. The midline structures are normal. There is no abnormal contrast enhancement. Vascular: Major flow voids are preserved. Skull and upper cervical spine: Normal calvarium and skull base. Visualized upper cervical spine and soft tissues are normal. Sinuses/Orbits:No paranasal sinus fluid levels or advanced mucosal thickening. No mastoid  or middle ear effusion. Normal orbits. MRA HEAD FINDINGS POSTERIOR CIRCULATION: --Vertebral arteries: Normal --Inferior cerebellar arteries: Normal. --Basilar artery: Normal. --Superior cerebellar arteries: Normal. --Posterior cerebral arteries: Normal. ANTERIOR CIRCULATION: --Intracranial internal carotid arteries: Normal. --Anterior cerebral arteries (ACA): Normal. --Middle cerebral arteries (MCA): Normal. ANATOMIC VARIANTS: Both posterior communicating arteries are patent MRA NECK FINDINGS No vertebral or carotid artery abnormality. IMPRESSION: 1. No acute intracranial abnormality. 2. Normal MRA of the head and neck. 3. Multifocal chronic microhemorrhages in a predominantly peripheral distribution. Electronically Signed   By: Deatra Robinson M.D.   On: 04/17/2021 22:09   CT HEAD CODE STROKE WO CONTRAST`  Result Date: 04/18/2021 CLINICAL DATA:  Code stroke.  Seizure EXAM: CT HEAD WITHOUT CONTRAST TECHNIQUE: Contiguous axial images were obtained from the base  of the skull through the vertex without intravenous contrast. COMPARISON:  None. FINDINGS: Brain: There is no mass, hemorrhage or extra-axial collection. The size and configuration of the ventricles and extra-axial CSF spaces are normal. The brain parenchyma is normal, without evidence of acute or chronic infarction. Vascular: No abnormal hyperdensity of the major intracranial arteries or dural venous sinuses. No intracranial atherosclerosis. Skull: The visualized skull base, calvarium and extracranial soft tissues are normal. Sinuses/Orbits: No fluid levels or advanced mucosal thickening of the visualized paranasal sinuses. No mastoid or middle ear effusion. The orbits are normal. ASPECTS [facility] Stroke Program Early CT Score) - Ganglionic level infarction (caudate, lentiform nuclei, internal capsule, insula, M1-M3 cortex): 7 - Supraganglionic infarction (M4-M6 cortex): 3 Total score (0-10 with 10 being normal): 10 IMPRESSION: 1. Normal head CT. 2. ASPECTS  is 10. These results were called by telephone at the time of interpretation on 04/18/2021 at 10:53 pm to provider BRITTON RUST-CHESTER , who verbally acknowledged these results. Electronically Signed   By: Deatra Robinson M.D.   On: 04/18/2021 22:53   CT CHEST ABDOMEN PELVIS WO CONTRAST  Result Date: 04/17/2021 CLINICAL DATA:  Elevated white count. Vomiting, dizziness and fatigue. EXAM: CT CHEST, ABDOMEN AND PELVIS WITHOUT CONTRAST TECHNIQUE: Multidetector CT imaging of the chest, abdomen and pelvis was performed following the standard protocol without IV contrast. COMPARISON:  None. FINDINGS: CT CHEST FINDINGS Cardiovascular: Heart size is normal. No visible coronary artery calcification or aortic atherosclerotic calcification. Mediastinum/Nodes: No mass or lymphadenopathy. Lungs/Pleura: No pleural effusion. No pulmonary infiltrate or collapse. No mass or nodule. No emphysema or interstitial lung disease. Musculoskeletal: Bridging osteophytes throughout the mid to lower thoracic region suggesting diffuse idiopathic skeletal hyperostosis. CT ABDOMEN PELVIS FINDINGS Hepatobiliary: Normal Pancreas: Normal Spleen: Normal Adrenals/Urinary Tract: Adrenal glands are normal. Kidneys are normal without contrast. Bladder is normal. Stomach/Bowel: Stomach and small intestine are normal. Normal appendix. No abnormal colon finding. Vascular/Lymphatic: Minimal aortic atherosclerosis. No aneurysm. IVC is normal. No adenopathy. Reproductive: Normal Other: No free fluid or air. Musculoskeletal: Ordinary lower lumbar degenerative changes. IMPRESSION: No acute finding to explain the clinical presentation. Normal evaluation of the chest without contrast. No acutely significant abdominal or pelvic finding. Aortic atherosclerosis, mild. Bridging osteophytes in the thoracic region suggesting diffuse idiopathic skeletal hyperostosis. Lower lumbar degenerative disc disease. Electronically Signed   By: Paulina Fusi M.D.   On: 04/17/2021  14:42        Scheduled Meds:  amLODipine  5 mg Oral Daily   aspirin EC  81 mg Oral Daily   carvedilol  25 mg Oral BID WC   Chlorhexidine Gluconate Cloth  6 each Topical Daily   cholecalciferol  2,000 Units Oral Daily   cloNIDine  0.3 mg Transdermal Weekly   heparin  5,000 Units Subcutaneous Q8H   insulin aspart  0-5 Units Subcutaneous QHS   insulin aspart  0-9 Units Subcutaneous TID WC   insulin aspart  4 Units Subcutaneous TID WC   insulin glargine  35 Units Subcutaneous Daily   losartan  100 mg Oral Daily   Continuous Infusions:  sodium chloride 75 mL/hr at 04/19/21 0700    ceFAZolin (ANCEF) IV     levETIRAcetam 1,000 mg (04/19/21 0951)     LOS: 2 days    Time spent: 34 minutes    Marrion Coy, MD [facility]   To contact the attending provider between 7A-7P or the covering provider during after hours 7P-7A, please log into the web site [email or web address] and access using  universal [place] password for that web site. If you do not have the password, please call the hospital operator.  04/19/2021, 10:11 AM

## 2021-04-20 DIAGNOSIS — R7881 Bacteremia: Secondary | ICD-10-CM | POA: Diagnosis not present

## 2021-04-20 DIAGNOSIS — I161 Hypertensive emergency: Secondary | ICD-10-CM | POA: Diagnosis not present

## 2021-04-20 LAB — URINE CULTURE: Culture: NO GROWTH

## 2021-04-20 LAB — CBC WITH DIFFERENTIAL/PLATELET
Abs Immature Granulocytes: 0.03 10*3/uL (ref 0.00–0.07)
Basophils Absolute: 0 10*3/uL (ref 0.0–0.1)
Basophils Relative: 1 %
Eosinophils Absolute: 0.2 10*3/uL (ref 0.0–0.5)
Eosinophils Relative: 3 %
HCT: 37 % — ABNORMAL LOW (ref 39.0–52.0)
Hemoglobin: 12.6 g/dL — ABNORMAL LOW (ref 13.0–17.0)
Immature Granulocytes: 0 %
Lymphocytes Relative: 23 %
Lymphs Abs: 2 10*3/uL (ref 0.7–4.0)
MCH: 31.4 pg (ref 26.0–34.0)
MCHC: 34.1 g/dL (ref 30.0–36.0)
MCV: 92.3 fL (ref 80.0–100.0)
Monocytes Absolute: 0.6 10*3/uL (ref 0.1–1.0)
Monocytes Relative: 7 %
Neutro Abs: 5.9 10*3/uL (ref 1.7–7.7)
Neutrophils Relative %: 66 %
Platelets: 205 10*3/uL (ref 150–400)
RBC: 4.01 MIL/uL — ABNORMAL LOW (ref 4.22–5.81)
RDW: 11.9 % (ref 11.5–15.5)
WBC: 8.7 10*3/uL (ref 4.0–10.5)
nRBC: 0 % (ref 0.0–0.2)

## 2021-04-20 LAB — GLUCOSE, CAPILLARY
Glucose-Capillary: 216 mg/dL — ABNORMAL HIGH (ref 70–99)
Glucose-Capillary: 229 mg/dL — ABNORMAL HIGH (ref 70–99)
Glucose-Capillary: 167 mg/dL — ABNORMAL HIGH (ref 70–99)
Glucose-Capillary: 162 mg/dL — ABNORMAL HIGH (ref 70–99)

## 2021-04-20 LAB — BASIC METABOLIC PANEL
Anion gap: 6 (ref 5–15)
BUN: 33 mg/dL — ABNORMAL HIGH (ref 6–20)
CO2: 23 mmol/L (ref 22–32)
Calcium: 8.2 mg/dL — ABNORMAL LOW (ref 8.9–10.3)
Chloride: 104 mmol/L (ref 98–111)
Creatinine, Ser: 2.11 mg/dL — ABNORMAL HIGH (ref 0.61–1.24)
GFR, Estimated: 38 mL/min — ABNORMAL LOW (ref >60)
Glucose, Bld: 178 mg/dL — ABNORMAL HIGH (ref 70–99)
Potassium: 3.7 mmol/L (ref 3.5–5.1)
Sodium: 133 mmol/L — ABNORMAL LOW (ref 135–145)

## 2021-04-20 LAB — MAGNESIUM: Magnesium: 2.2 mg/dL (ref 1.7–2.4)

## 2021-04-20 LAB — ECHOCARDIOGRAM COMPLETE
AR max vel: 2.65 cm2
AV Peak grad: 4 mmHg
Ao pk vel: 1 m/s
Area-P 1/2: 2.69 cm2
Height: 71 in
S' Lateral: 3.42 cm
Weight: 4208.14 oz

## 2021-04-20 MED ORDER — INSULIN GLARGINE 100 UNIT/ML ~~LOC~~ SOLN
15.0000 [IU] | Freq: Every day | SUBCUTANEOUS | Status: DC
  Administered 2021-04-20 – 2021-04-24 (×5): 15 [IU] via SUBCUTANEOUS
  Filled 2021-04-20 (×6): qty 0.15

## 2021-04-20 NOTE — Plan of Care (Signed)

## 2021-04-20 NOTE — Progress Notes (Addendum)
    [facility] has been requested to perform a transesophageal echocardiogram on Clayton [name] for bactermeia.  After careful review of history and examination, the risks and benefits of transesophageal echocardiogram have been explained including risks of esophageal damage, perforation (1:10,000 risk), bleeding, pharyngeal hematoma as well as other potential complications associated with conscious sedation including aspiration, arrhythmia, respiratory failure and death. Alternatives to treatment were discussed, questions were answered. Patient is willing to proceed.  We will plan to schedule this on Tuesday 7/5.  Nicolasa Ducking, NP  04/20/2021 9:53 AM

## 2021-04-20 NOTE — Progress Notes (Addendum)
PROGRESS NOTE    Clayton [name]  FGH:[id number] DOB: 1971-06-07 DOA: 04/17/2021 PCP: [facility]   Chief complaint.  Severe headache. Brief Narrative:  Clayton [name] is a 50 y.o. male with medical history significant of hypertension, diabetes mellitus, CKD stage IIIb, who presents with nausea, vomiting, headache, right-sided neck pain. Upon arriving the hospital, blood pressure was 247/115.  Patient was continued on home medicines, was also given labetalol IV push, then she was placed on nicardipine drip. MRI brain was performed, showed microhemorrhages, I discussed the case with Dr. Otelia Limes, he has reviewed the MRI images, currently there is no concern for future bleeding.  Okay to continue aspirin and a prophylactic heparin. Carotid MRA did not show any carotid dissection. 7/1.  Discontinued nicardipine, started on clonidine 0.2 mg twice a day.  Increase insulin dose. 7/2.  Patient developed myoclonic and seizure x2, seen by neurology, started Keppra.  MRI obtained.  Blood culture positive for gram-positive cocci.   Assessment & Plan:   Principal Problem:   Hypertensive emergency Active Problems:   Nausea & vomiting   Acute metabolic encephalopathy   CKD (chronic kidney disease), stage IIIb   Type II diabetes mellitus with renal manifestations (HCC)   Leukocytosis   Elevated lactic acid level   Elevated troponin   Seizure (HCC)   Bacteremia due to Gram-positive bacteria   Hyponatremia   Acute urinary retention   Hypertensive encephalopathy  #1.  Hypertensive emergency. Hypertensive encephalopathy.   Elevated troponin secondary to hypertensive emergency. Intracranial microhemorrhages. Patient condition improving, blood pressure is more stable.  Continue current regimen.  2.  Staph epidermidis bacteremia. Possible bacterial meningitis. Reviewed patient MRI results, discussed with neurology and infectious disease. Due to significant bacteremia.  I  will continue Rocephin 2 g every 12 hours.  Repeated culture sent out.  Per neurology, no LP is needed at this time. Urine drug screen is pending.  Patient denies ever use recreational drugs.  HIV screening negative.  #3.  Myoclonic seizure. On Keppra per neurology, pending EEG.  #4 uncontrolled type 2 diabetes with hyperglycemia. Glucose much better, decreased insulin dose.  5.  Chronic kidney disease stage IIIb. Renal function improving, continue IV fluids, patient may have an acute component of renal failure.  Will transfer patient to progressive unit.  DVT prophylaxis: SCDs Code Status: Full Family Communication: Wife updated Disposition Plan:    Status is: Inpatient  Remains inpatient appropriate because:IV treatments appropriate due to intensity of illness or inability to take PO and Inpatient level of care appropriate due to severity of illness  Dispo: The patient is from: Home              Anticipated d/c is to: Home              Patient currently is not medically stable to d/c.   Difficult to place patient No        I/O last 3 completed shifts: In: 2834.2 [I.V.:2341; IV Piggyback:493.2] Out: 950 [Urine:950] No intake/output data recorded.     Consultants:  Neurology  Procedures: None  Antimicrobials: Rocephin  Subjective: Patient doing much better today, no additional headache or nausea vomiting, tolerating diet.  Denies any abdominal pain, no diarrhea. No fever or chills. No dysuria hematuria. No chest pain or palpitation.  Objective: Vitals:   04/20/21 0400 04/20/21 0430 04/20/21 0700 04/20/21 0800  BP: 127/86 136/83 (!) 146/88 (!) 161/91  Pulse: 61 (!) 57 67 64  Resp: 14 15 15  20  Temp:    98.9 F (37.2 C)  TempSrc:    Oral  SpO2: 96% 96% 95% 97%  Weight:      Height:        Intake/Output Summary (Last 24 hours) at 04/20/2021 1055 Last data filed at 04/20/2021 0500 Gross per 24 hour  Intake 1858.62 ml  Output 950 ml  Net 908.62 ml    Filed Weights   04/17/21 0927 04/17/21 2300  Weight: 119.3 kg 119.3 kg    Examination:  General exam: Appears calm and comfortable  Respiratory system: Clear to auscultation. Respiratory effort normal. Cardiovascular system: S1 & S2 heard, RRR. No JVD, murmurs, rubs, gallops or clicks. No pedal edema. Gastrointestinal system: Abdomen is nondistended, soft and nontender. No organomegaly or masses felt. Normal bowel sounds heard. Central nervous system: Alert and oriented x3. No focal neurological deficits. Extremities: Symmetric 5 x 5 power. Skin: No rashes, lesions or ulcers Psychiatry: Judgement and insight appear normal. Mood & affect appropriate.     Data Reviewed: I have personally reviewed following labs and imaging studies  CBC: Recent Labs  Lab 04/17/21 0940 04/18/21 0440 04/19/21 0416 04/20/21 0355  WBC 17.3* 12.5* 12.3* 8.7  NEUTROABS  --   --  9.1* 5.9  HGB 16.1 14.9 13.8 12.6*  HCT 46.0 41.8 39.3 37.0*  MCV 89.7 90.5 90.1 92.3  PLT 252 230 215 205   Basic Metabolic Panel: Recent Labs  Lab 04/17/21 0940 04/18/21 0440 04/19/21 0416 04/20/21 0355  NA 132* 130* 133* 133*  K 4.3 3.6 4.1 3.7  CL 88* 97* 99 104  CO2 GLUCOSE 441* 364* 218* 178*  BUN 30* 35* 36* 33*  CREATININE 2.37* 2.56* 2.57* 2.11*  CALCIUM 9.8 8.5* 8.5* 8.2*  MG  --   --  2.3 2.2   GFR: Estimated Creatinine Clearance: 56.3 mL/min (A) (by C-G formula based on SCr of 2.11 mg/dL (H)). Liver Function Tests: No results for input(s): AST, ALT, ALKPHOS, BILITOT, PROT, ALBUMIN in the last 168 hours. Recent Labs  Lab 04/17/21 0940  LIPASE 27   No results for input(s): AMMONIA in the last 168 hours. Coagulation Profile: Recent Labs  Lab 04/17/21 0940  INR 1.0   Cardiac Enzymes: No results for input(s): CKTOTAL, CKMB, CKMBINDEX, TROPONINI in the last 168 hours. BNP (last 3 results) No results for input(s): PROBNP in the last 8760 hours. HbA1C: Recent Labs     04/18/21 0440  HGBA1C 12.0*   CBG: Recent Labs  Lab 04/19/21 1142 04/19/21 1609 04/19/21 2123 04/19/21 2331 04/20/21 0754  GLUCAP 141* 99 77 142* 216*   Lipid Profile: Recent Labs    04/18/21 0440  CHOL 198  HDL 43  LDLCALC 100*  TRIG 275*  CHOLHDL 4.6   Thyroid Function Tests: No results for input(s): TSH, T4TOTAL, FREET4, T3FREE, THYROIDAB in the last 72 hours. Anemia Panel: No results for input(s): VITAMINB12, FOLATE, FERRITIN, TIBC, IRON, RETICCTPCT in the last 72 hours. Sepsis Labs: Recent Labs  Lab 04/17/21 0940 04/17/21 1218 04/17/21 1522 04/17/21 1638 04/17/21 1945 04/17/21 2215  PROCALCITON <0.10  --   --   --   --   --   LATICACIDVEN  --    < > 3.0* 2.9* 3.7* 2.5*   < > = values in this interval not displayed.    Recent Results (from the past 240 hour(s))  Resp Panel by RT-PCR (Flu A&B, Covid) Nasopharyngeal Swab     Status: None   Collection  Time: 04/17/21  9:40 AM   Specimen: Nasopharyngeal Swab; Nasopharyngeal(NP) swabs in vial transport medium  Result Value Ref Range Status   SARS Coronavirus 2 by RT PCR NEGATIVE NEGATIVE Final    Comment: (NOTE) SARS-CoV-2 target nucleic acids are NOT DETECTED.  The SARS-CoV-2 RNA is generally detectable in upper respiratory specimens during the acute phase of infection. The lowest concentration of SARS-CoV-2 viral copies this assay can detect is 138 copies/mL. A negative result does not preclude SARS-Cov-2 infection and should not be used as the sole basis for treatment or other patient management decisions. A negative result may occur with  improper specimen collection/handling, submission of specimen other than nasopharyngeal swab, presence of viral mutation(s) within the areas targeted by this assay, and inadequate number of viral copies(<138 copies/mL). A negative result must be combined with clinical observations, patient history, and epidemiological information. The expected result is  Negative.  Fact Sheet for Patients:  BloggerCourse.com  Fact Sheet for Healthcare Providers:  SeriousBroker.it  This test is no t yet approved or cleared by the [place] FDA and  has been authorized for detection and/or diagnosis of SARS-CoV-2 by FDA under an Emergency Use Authorization (EUA). This EUA will remain  in effect (meaning this test can be used) for the duration of the COVID-19 declaration under Section 564(b)(1) of the Act, 21 U.S.C.section 360bbb-3(b)(1), unless the authorization is terminated  or revoked sooner.       Influenza A by PCR NEGATIVE NEGATIVE Final   Influenza B by PCR NEGATIVE NEGATIVE Final    Comment: (NOTE) The Xpert Xpress SARS-CoV-2/FLU/RSV plus assay is intended as an aid in the diagnosis of influenza from Nasopharyngeal swab specimens and should not be used as a sole basis for treatment. Nasal washings and aspirates are unacceptable for Xpert Xpress SARS-CoV-2/FLU/RSV testing.  Fact Sheet for Patients: BloggerCourse.com  Fact Sheet for Healthcare Providers: SeriousBroker.it  This test is not yet approved or cleared by the [place] FDA and has been authorized for detection and/or diagnosis of SARS-CoV-2 by FDA under an Emergency Use Authorization (EUA). This EUA will remain in effect (meaning this test can be used) for the duration of the COVID-19 declaration under Section 564(b)(1) of the Act, 21 U.S.C. section 360bbb-3(b)(1), unless the authorization is terminated or revoked.  Performed at [facility], [address] [place], [place] [address]   CULTURE, BLOOD (ROUTINE X 2) w Reflex to ID Panel     Status: None (Preliminary result)   Collection Time: 04/17/21  4:34 PM   Specimen: BLOOD  Result Value Ref Range Status   Specimen Description BLOOD RIGHT ANTECUBITAL  Final   Special Requests   Final    BOTTLES DRAWN  AEROBIC AND ANAEROBIC Blood Culture results may not be optimal due to an inadequate volume of blood received in culture bottles   Culture  Setup Time   Final    GRAM POSITIVE COCCI ANAEROBIC BOTTLE ONLY CRITICAL VALUE NOTED.  VALUE IS CONSISTENT WITH PREVIOUSLY REPORTED AND CALLED VALUE. Performed at [facility], [address] [place], [place] [address]    Culture [facility] POSITIVE COCCI  Final   Report Status PENDING  Incomplete  CULTURE, BLOOD (ROUTINE X 2) w Reflex to ID Panel     Status: None (Preliminary result)   Collection Time: 04/17/21  4:38 PM   Specimen: BLOOD  Result Value Ref Range Status   Specimen Description   Final    BLOOD LEFT ANTECUBITAL Performed at [facility],  [address], [place], [place] [address]    Special Requests   Final    BOTTLES DRAWN AEROBIC AND ANAEROBIC Blood Culture results may not be optimal due to an inadequate volume of blood received in culture bottles Performed at [facility], [address] [place], [place] [address]    Culture  Setup Time   Final    GRAM POSITIVE COCCI IN BOTH AEROBIC AND ANAEROBIC BOTTLES CRITICAL RESULT CALLED TO, READ BACK BY AND VERIFIED WITH: SUSAN WATSON @1518  ON 07.01.22.SH Performed at [facility], [address] [place], [place]    Culture GRAM POSITIVE COCCI  Final   Report Status PENDING  Incomplete  Blood Culture ID Panel (Reflexed)     Status: Abnormal   Collection Time: 04/17/21  4:38 PM  Result Value Ref Range Status   Enterococcus faecalis NOT DETECTED NOT DETECTED Final   Enterococcus Faecium NOT DETECTED NOT DETECTED Final   Listeria monocytogenes NOT DETECTED NOT DETECTED Final   Staphylococcus species DETECTED (A) NOT DETECTED Final    Comment: CRITICAL RESULT CALLED TO, READ BACK BY AND VERIFIED WITH: SUSAN WATSON @1518  ON 07.01.22.SH    Staphylococcus aureus (BCID) NOT DETECTED NOT DETECTED Final   Staphylococcus epidermidis DETECTED (A) NOT  DETECTED Final    Comment: CRITICAL RESULT CALLED TO, READ BACK BY AND VERIFIED WITH: SUSAN WATSON @1518  ON 07.01.22.SH    Staphylococcus lugdunensis NOT DETECTED NOT DETECTED Final   Streptococcus species NOT DETECTED NOT DETECTED Final   Streptococcus agalactiae NOT DETECTED NOT DETECTED Final   Streptococcus pneumoniae NOT DETECTED NOT DETECTED Final   Streptococcus pyogenes NOT DETECTED NOT DETECTED Final   A.calcoaceticus-baumannii NOT DETECTED NOT DETECTED Final   Bacteroides fragilis NOT DETECTED NOT DETECTED Final   Enterobacterales NOT DETECTED NOT DETECTED Final   Enterobacter cloacae complex NOT DETECTED NOT DETECTED Final   Escherichia coli NOT DETECTED NOT DETECTED Final   Klebsiella aerogenes NOT DETECTED NOT DETECTED Final   Klebsiella oxytoca NOT DETECTED NOT DETECTED Final   Klebsiella pneumoniae NOT DETECTED NOT DETECTED Final   Proteus species NOT DETECTED NOT DETECTED Final   Salmonella species NOT DETECTED NOT DETECTED Final   Serratia marcescens NOT DETECTED NOT DETECTED Final   Haemophilus influenzae NOT DETECTED NOT DETECTED Final   Neisseria meningitidis NOT DETECTED NOT DETECTED Final   Pseudomonas aeruginosa NOT DETECTED NOT DETECTED Final   Stenotrophomonas maltophilia NOT DETECTED NOT DETECTED Final   Candida albicans NOT DETECTED NOT DETECTED Final   Candida auris NOT DETECTED NOT DETECTED Final   Candida glabrata NOT DETECTED NOT DETECTED Final   Candida krusei NOT DETECTED NOT DETECTED Final   Candida parapsilosis NOT DETECTED NOT DETECTED Final   Candida tropicalis NOT DETECTED NOT DETECTED Final   Cryptococcus neoformans/gattii NOT DETECTED NOT DETECTED Final   Methicillin resistance mecA/C NOT DETECTED NOT DETECTED Final    Comment: Performed at [facility], [address] [place], [facility] [address]  MRSA Next Gen by PCR, Nasal     Status: None   Collection Time: 04/18/21  1:37 AM   Specimen: Nasal Mucosa; Nasal Swab  Result Value Ref  Range Status   MRSA by PCR Next Gen NOT DETECTED NOT DETECTED Final    Comment: (NOTE) The GeneXpert MRSA Assay (FDA approved for NASAL specimens only), is one component of a comprehensive MRSA colonization surveillance program. It is not intended to diagnose MRSA infection nor to guide or monitor treatment for MRSA infections. Test performance  is not FDA approved in patients less than 49 years old. Performed at [facility], [address] [place], [place] [address]   CULTURE, BLOOD (ROUTINE X 2) w Reflex to ID Panel     Status: None (Preliminary result)   Collection Time: 04/20/21 12:01 AM   Specimen: BLOOD  Result Value Ref Range Status   Specimen Description BLOOD BLOOD RIGHT FOREARM  Final   Special Requests   Final    BOTTLES DRAWN AEROBIC AND ANAEROBIC Blood Culture adequate volume   Culture   Final    NO GROWTH < 12 HOURS Performed at [facility], [address] [place], [place] [address]    Report Status PENDING  Incomplete  CULTURE, BLOOD (ROUTINE X 2) w Reflex to ID Panel     Status: None (Preliminary result)   Collection Time: 04/20/21 12:02 AM   Specimen: BLOOD  Result Value Ref Range Status   Specimen Description BLOOD BLOOD RIGHT HAND  Final   Special Requests   Final    BOTTLES DRAWN AEROBIC AND ANAEROBIC Blood Culture adequate volume   Culture   Final    NO GROWTH < 12 HOURS Performed at [facility], [address] [place], [place] [address]    Report Status PENDING  Incomplete         Radiology Studies: MR BRAIN W WO CONTRAST  Result Date: 04/19/2021 CLINICAL DATA:  Seizure, left-sided weakness EXAM: MRI HEAD WITHOUT AND WITH CONTRAST TECHNIQUE: Multiplanar, multiecho pulse sequences of the brain and surrounding structures were obtained without and with intravenous contrast. CONTRAST:  10mL GADAVIST GADOBUTROL 1 MMOL/ML IV SOLN COMPARISON:  04/17/2021 FINDINGS: Brain: There is no acute infarction or intracranial  hemorrhage. There is no intracranial mass, mass effect, or edema. There is no hydrocephalus or extra-axial fluid collection. Ventricles and sulci are normal in size and configuration. There are numerous foci of susceptibility involving the cerebral white matter, central gray nuclei, brainstem, and cerebellum. Patchy foci of T2 hyperintensity in the supratentorial white matter nonspecific but may reflect mild chronic microvascular ischemic changes. There is abnormal sulcal T2 FLAIR hyperintensity. No abnormal leptomeningeal enhancement. No abnormal parenchymal enhancement. Vascular: Major vessel flow voids at the skull base are preserved. Skull and upper cervical spine: Normal marrow signal is preserved. Sinuses/Orbits: Paranasal sinuses are aerated. Bilateral lens replacements. Other: Sella is unremarkable.  Mastoid air cells are clear. 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 Although there is no corresponding abnormal diffusion or enhancement, consider meningitis given positive blood cultures. Unchanged numerous foci of susceptibility. May reflect chronic microhemorrhages secondary to chronic hypertension. Embolic material is also a consideration. Correlate with echocardiography. Electronically Signed   By: Guadlupe SpanishPraneil  Patel M.D.   On: 04/19/2021 15:08   ECHOCARDIOGRAM COMPLETE  Result Date: 04/20/2021    ECHOCARDIOGRAM REPORT   [facility] Date of Exam: 04/19/2021 Medical Rec #:  [id number]      Height:       71.0 in Accession #:    [id number]     Weight:       263.0 lb Date of Birth:  [birth year]      BSA:          2.369 m Patient Age:    49 years       BP:           141/91 mmHg Patient Gender: M              HR:  64 bpm. Exam Location:  [facility] Procedure: 2D Echo Indications:     Bacteremia R78.81  History:         Patient has no prior history of Echocardiogram examinations.  Sonographer:     Overton Mam RDCS Referring Phys:  [id number] CARMEN  [place] Saliva Diagnosing Phys: Julien Nordmann MD IMPRESSIONS  1. Left ventricular ejection fraction, by estimation, is 60 to 65%. The left ventricle has normal function. The left ventricle has no regional wall motion abnormalities. There is moderate left ventricular hypertrophy. Left ventricular diastolic parameters are consistent with Grade I diastolic dysfunction (impaired relaxation).  2. Right ventricular systolic function is normal. The right ventricular size is normal. Tricuspid regurgitation signal is inadequate for assessing PA pressure.  3. No valve vegetation noted. FINDINGS  Left Ventricle: Left ventricular ejection fraction, by estimation, is 60 to 65%. The left ventricle has normal function. The left ventricle has no regional wall motion abnormalities. The left ventricular internal cavity size was normal in size. There is  moderate left ventricular hypertrophy. Left ventricular diastolic parameters are consistent with Grade I diastolic dysfunction (impaired relaxation). Right Ventricle: The right ventricular size is normal. No increase in right ventricular wall thickness. Right ventricular systolic function is normal. Tricuspid regurgitation signal is inadequate for assessing PA pressure. Left Atrium: Left atrial size was normal in size. Right Atrium: Right atrial size was normal in size. Pericardium: There is no evidence of pericardial effusion. Mitral Valve: The mitral valve is normal in structure. No evidence of mitral valve regurgitation. No evidence of mitral valve stenosis. Tricuspid Valve: The tricuspid valve is normal in structure. Tricuspid valve regurgitation is not demonstrated. No evidence of tricuspid stenosis. Aortic Valve: The aortic valve is normal in structure. Aortic valve regurgitation is not visualized. No aortic stenosis is present. Aortic valve peak gradient measures 4.0 mmHg. Pulmonic Valve: The pulmonic valve was normal in structure. Pulmonic valve regurgitation is not visualized.  No evidence of pulmonic stenosis. Aorta: The aortic root is normal in size and structure. Venous: The pulmonary veins were not well visualized. The inferior vena cava is normal in size with greater than 50% respiratory variability, suggesting right atrial pressure of 3 mmHg. IAS/Shunts: No atrial level shunt detected by color flow Doppler.  LEFT VENTRICLE PLAX 2D LVIDd:         4.95 cm  Diastology LVIDs:         3.42 cm  LV e' medial:    4.13 cm/s LV PW:         1.68 cm  LV E/e' medial:  13.2 LV IVS:        1.78 cm  LV e' lateral:   4.24 cm/s LVOT diam:     1.90 cm  LV E/e' lateral: 12.8 LV SV:         49 LV SV Index:   21 LVOT Area:     2.84 cm  RIGHT VENTRICLE RV Basal diam:  2.32 cm RV S prime:     14.30 cm/s TAPSE (M-mode): 1.6 cm LEFT ATRIUM           Index       RIGHT ATRIUM          Index LA diam:      3.50 cm 1.48 cm/m  RA Area:     9.24 cm LA Vol (A2C): 17.0 ml 7.18 ml/m  RA Volume:   17.00 ml 7.18 ml/m LA Vol (A4C): 36.9 ml 15.58 ml/m  AORTIC VALVE  PULMONIC VALVE AV Area (Vmax): 2.65 cm    PV Vmax:       0.92 m/s AV Vmax:        100.00 cm/s PV Peak grad:  3.4 mmHg AV Peak Grad:   4.0 mmHg LVOT Vmax:      93.30 cm/s LVOT Vmean:     59.200 cm/s LVOT VTI:       0.174 m  AORTA Ao Root diam: 2.90 cm Ao Asc diam:  3.20 cm MITRAL VALVE MV Area (PHT): 2.69 cm    SHUNTS MV Decel Time: 282 msec    Systemic VTI:  0.17 m MV E velocity: 54.40 cm/s  Systemic Diam: 1.90 cm MV A velocity: 85.30 cm/s MV E/A ratio:  0.64 Julien Nordmann MD Electronically signed by Julien Nordmann MD Signature Date/Time: 04/20/2021/9:34:35 AM    Final    CT HEAD CODE STROKE WO CONTRAST`  Result Date: 04/18/2021 CLINICAL DATA:  Code stroke.  Seizure EXAM: CT HEAD WITHOUT CONTRAST TECHNIQUE: Contiguous axial images were obtained from the base of the skull through the vertex without intravenous contrast. COMPARISON:  None. FINDINGS: Brain: There is no mass, hemorrhage or extra-axial collection. The size and configuration  of the ventricles and extra-axial CSF spaces are normal. The brain parenchyma is normal, without evidence of acute or chronic infarction. Vascular: No abnormal hyperdensity of the major intracranial arteries or dural venous sinuses. No intracranial atherosclerosis. Skull: The visualized skull base, calvarium and extracranial soft tissues are normal. Sinuses/Orbits: No fluid levels or advanced mucosal thickening of the visualized paranasal sinuses. No mastoid or middle ear effusion. The orbits are normal. ASPECTS [facility] Stroke Program Early CT Score) - Ganglionic level infarction (caudate, lentiform nuclei, internal capsule, insula, M1-M3 cortex): 7 - Supraganglionic infarction (M4-M6 cortex): 3 Total score (0-10 with 10 being normal): 10 IMPRESSION: 1. Normal head CT. 2. ASPECTS is 10. These results were called by telephone at the time of interpretation on 04/18/2021 at 10:53 pm to provider BRITTON RUST-CHESTER , who verbally acknowledged these results. Electronically Signed   By: Deatra Robinson M.D.   On: 04/18/2021 22:53        Scheduled Meds:  amLODipine  5 mg Oral Daily   carvedilol  25 mg Oral BID WC   Chlorhexidine Gluconate Cloth  6 each Topical Daily   cholecalciferol  2,000 Units Oral Daily   cloNIDine  0.3 mg Transdermal Weekly   heparin  5,000 Units Subcutaneous Q8H   insulin aspart  0-5 Units Subcutaneous QHS   insulin aspart  0-9 Units Subcutaneous TID WC   insulin glargine  15 Units Subcutaneous Daily   losartan  100 mg Oral Daily   tamsulosin  0.4 mg Oral Daily   Continuous Infusions:  sodium chloride 75 mL/hr at 04/20/21 0500   cefTRIAXone (ROCEPHIN)  IV 2 g (04/20/21 0954)   levETIRAcetam 1,000 mg (04/20/21 0829)     LOS: 3 days    Time spent: 32 minutes    Marrion Coy, MD [facility]   To contact the attending provider between 7A-7P or the covering provider during after hours 7P-7A, please log into the web site [email or web address] and access using universal [facility] password for that web site. If you do not have the password, please call the hospital operator.  04/20/2021, 10:55 AM

## 2021-04-21 ENCOUNTER — Inpatient Hospital Stay: Payer: 59

## 2021-04-21 ENCOUNTER — Encounter: Payer: Self-pay | Admitting: Internal Medicine

## 2021-04-21 DIAGNOSIS — R7881 Bacteremia: Secondary | ICD-10-CM | POA: Diagnosis not present

## 2021-04-21 DIAGNOSIS — R569 Unspecified convulsions: Secondary | ICD-10-CM | POA: Diagnosis not present

## 2021-04-21 DIAGNOSIS — I161 Hypertensive emergency: Secondary | ICD-10-CM | POA: Diagnosis not present

## 2021-04-21 DIAGNOSIS — I674 Hypertensive encephalopathy: Secondary | ICD-10-CM | POA: Diagnosis not present

## 2021-04-21 LAB — CBC WITH DIFFERENTIAL/PLATELET
Abs Immature Granulocytes: 0.04 10*3/uL (ref 0.00–0.07)
Basophils Absolute: 0 10*3/uL (ref 0.0–0.1)
Basophils Relative: 0 %
Eosinophils Absolute: 0.2 10*3/uL (ref 0.0–0.5)
Eosinophils Relative: 2 %
HCT: 38.1 % — ABNORMAL LOW (ref 39.0–52.0)
Hemoglobin: 13.2 g/dL (ref 13.0–17.0)
Immature Granulocytes: 0 %
Lymphocytes Relative: 13 %
Lymphs Abs: 1.3 10*3/uL (ref 0.7–4.0)
MCH: 31.6 pg (ref 26.0–34.0)
MCHC: 34.6 g/dL (ref 30.0–36.0)
MCV: 91.1 fL (ref 80.0–100.0)
Monocytes Absolute: 0.7 10*3/uL (ref 0.1–1.0)
Monocytes Relative: 7 %
Neutro Abs: 7.3 10*3/uL (ref 1.7–7.7)
Neutrophils Relative %: 78 %
Platelets: 213 10*3/uL (ref 150–400)
RBC: 4.18 MIL/uL — ABNORMAL LOW (ref 4.22–5.81)
RDW: 11.9 % (ref 11.5–15.5)
WBC: 9.6 10*3/uL (ref 4.0–10.5)
nRBC: 0 % (ref 0.0–0.2)

## 2021-04-21 LAB — BASIC METABOLIC PANEL
Anion gap: 9 (ref 5–15)
BUN: 26 mg/dL — ABNORMAL HIGH (ref 6–20)
CO2: 22 mmol/L (ref 22–32)
Calcium: 8.4 mg/dL — ABNORMAL LOW (ref 8.9–10.3)
Chloride: 103 mmol/L (ref 98–111)
Creatinine, Ser: 2.01 mg/dL — ABNORMAL HIGH (ref 0.61–1.24)
GFR, Estimated: 40 mL/min — ABNORMAL LOW (ref >60)
Glucose, Bld: 191 mg/dL — ABNORMAL HIGH (ref 70–99)
Potassium: 3.8 mmol/L (ref 3.5–5.1)
Sodium: 134 mmol/L — ABNORMAL LOW (ref 135–145)

## 2021-04-21 LAB — GLUCOSE, CAPILLARY
Glucose-Capillary: 180 mg/dL — ABNORMAL HIGH (ref 70–99)
Glucose-Capillary: 188 mg/dL — ABNORMAL HIGH (ref 70–99)
Glucose-Capillary: 255 mg/dL — ABNORMAL HIGH (ref 70–99)
Glucose-Capillary: 166 mg/dL — ABNORMAL HIGH (ref 70–99)

## 2021-04-21 LAB — MAGNESIUM: Magnesium: 2.2 mg/dL (ref 1.7–2.4)

## 2021-04-21 IMAGING — CT CT ANGIO HEAD-NECK (W OR W/O PERF)
3 of 10 series · 10 of 35 positions shown · IV contrast (omnipaque)
Comparison: Brain MRI [DATE].  Head CT [DATE] and earlier.

CLINICAL DATA: 49-year-old male with seizure and left side
weakness. Hypertensive. Positive blood cultures for gram positive
cocci. Abnormal bilateral cerebral sulci or leptomeninges on MRI
[DATE]. Chronic microhemorrhages.

EXAM:
CT ANGIOGRAPHY HEAD AND NECK
TECHNIQUE: Multidetector CT imaging of the head and neck was performed using
the standard protocol during bolus administration of intravenous
contrast. Multiplanar CT image reconstructions and MIPs were
obtained to evaluate the vascular anatomy. Carotid stenosis
measurements (when applicable) are obtained utilizing NASCET
criteria, using the distal internal carotid diameter as the
denominator.
CONTRAST:  60mL OMNIPAQUE IOHEXOL 350 MG/ML SOLN

[Series 8: cta head neck · axial · 0.46mm/px · z∈[-291,+89]mm · 3 of 190 slices shown]
[im 1/190  soft-tissue]
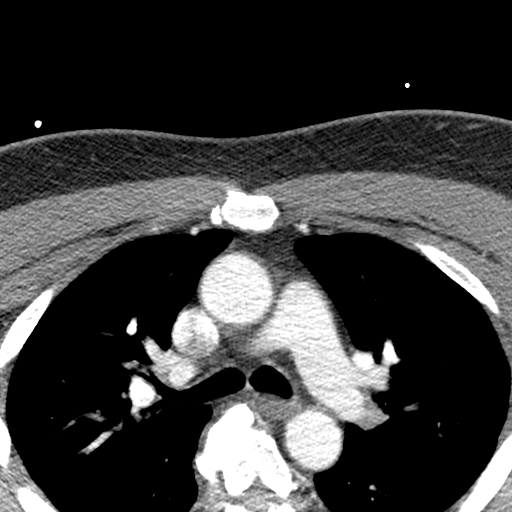
[im 95/190  bone]
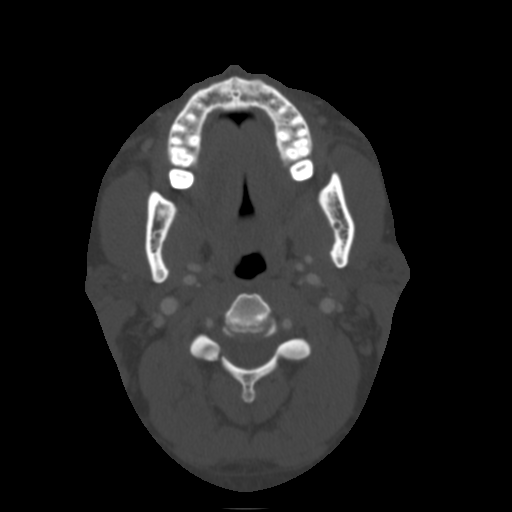
[im 190/190  soft-tissue]
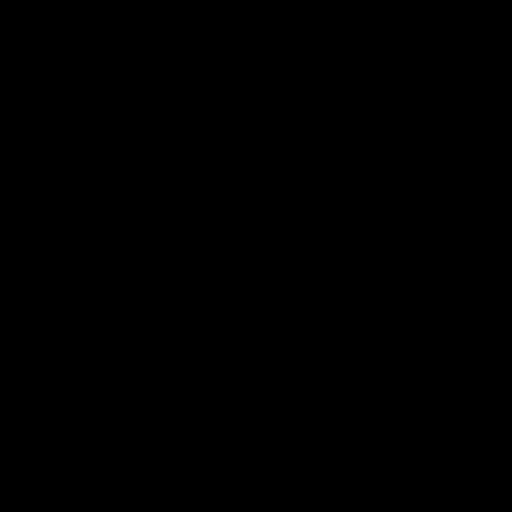

[Series 10: ax thin · axial · 0.40mm/px · z∈[-240,+30]mm · 6 of 378 slices shown]
[im 54/378  soft-tissue]
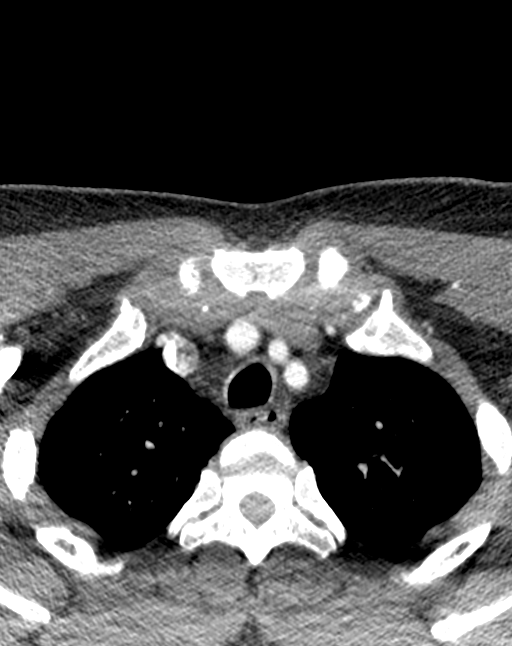
[im 108/378  soft-tissue]
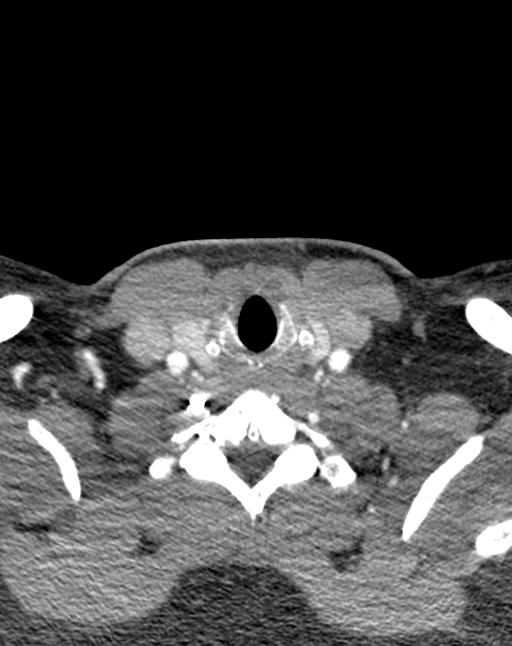
[im 162/378  soft-tissue]
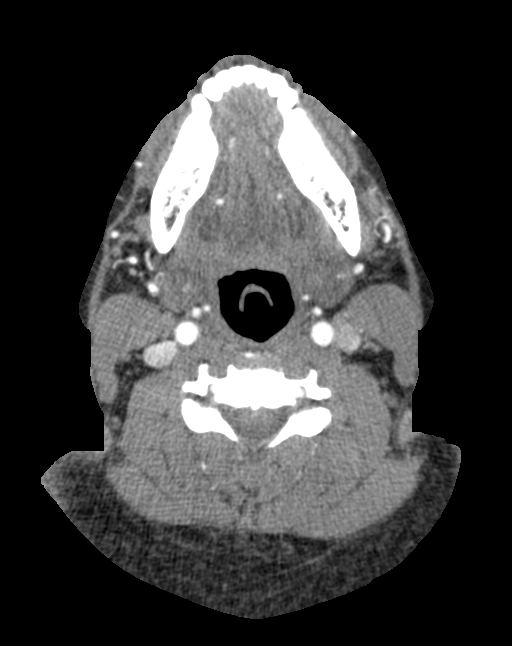
[im 216/378  soft-tissue]
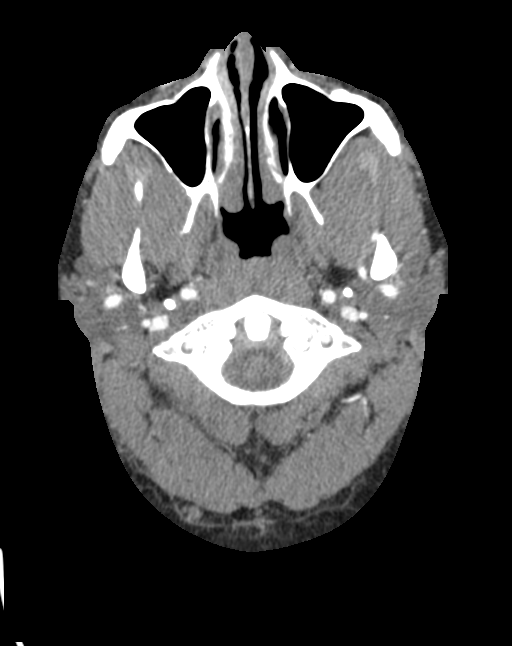
[im 270/378  soft-tissue]
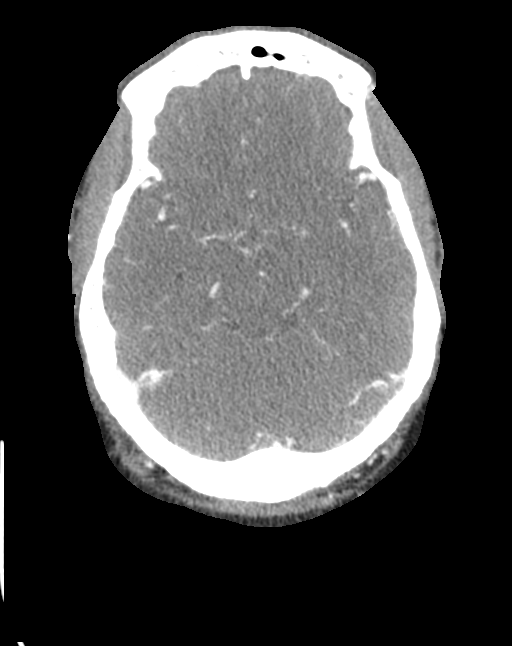
[im 324/378  soft-tissue]
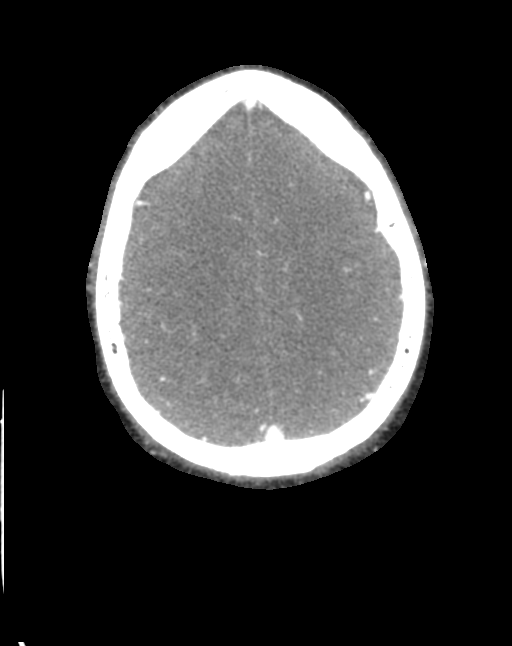

[Series 14: sagittal thin · sagittal · 0.50mm/px · 1 of 173 slices shown]
[im 30/173  soft-tissue]
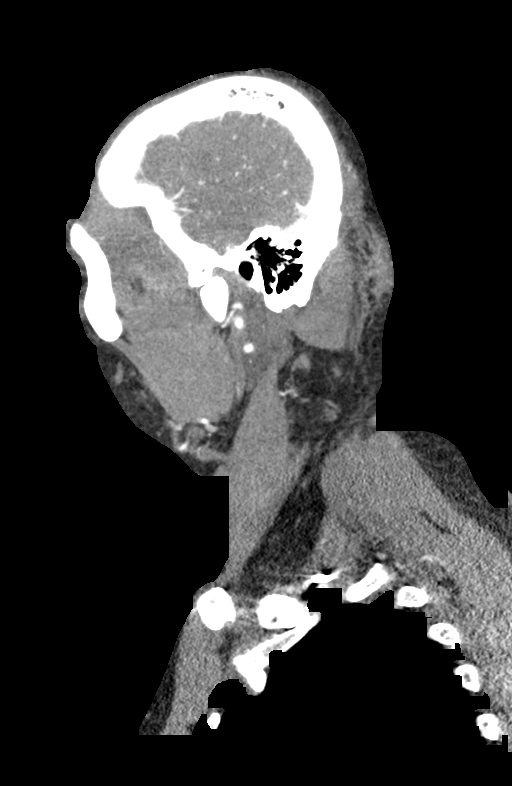

[10 of 35 positions shown; findings below may reference images not displayed]

FINDINGS: CT HEAD

Brain: Cerebral volume is stable and within normal limits. No
midline shift, ventriculomegaly, mass effect, evidence of mass
lesion, intracranial hemorrhage or evidence of cortically based
acute infarction. Gray-white matter differentiation is within normal
limits throughout the brain. No hyperdense hemorrhage or CT
correlation to the abnormal sulcal appearance on MR [REDACTED].

Calvarium and skull base: No acute osseous abnormality identified.

Paranasal sinuses: Visualized paranasal sinuses and mastoids are
clear.

Orbits: No acute orbit or scalp soft tissue finding.

CTA NECK

Skeleton: No acute osseous abnormality identified. Advanced chronic
C5-C6 disc and endplate degeneration.

Upper chest: Negative lung apices and superior mediastinum.

Other neck: Negative.

Aortic arch: 3 vessel arch configuration.  No arch atherosclerosis.

Right carotid system: Negative brachiocephalic artery. Tortuous
proximal right CCA. Minimal calcified plaque at the right ICA bulb.
Mildly tortuous distal right ICA without stenosis.

Left carotid system: Negative aside from mild tortuosity and mild
left ICA bulb calcified plaque similar to the right side.

Vertebral arteries:
Normal proximal right subclavian artery and right vertebral artery
origin. Right V1 segment obscured by paravertebral venous contrast.
But otherwise the cervical right vertebral artery appears patent and
within normal limits to the skull base.

Normal proximal left subclavian artery and left vertebral artery
origin. Mildly tortuous left vertebral is patent without plaque or
stenosis to the skull base.

CTA HEAD

Suboptimal intracranial arterial contrast bolus.

Posterior circulation: Fairly codominant distal vertebral arteries
are patent to the basilar without plaque or stenosis. Both a ICAs
appear dominant. Patent basilar artery without stenosis. Normal SCA
and PCA origins. Both posterior communicating arteries are present.
Bilateral PCA branches are within normal limits.

Anterior circulation: Both ICA siphons are patent. No siphon plaque
or stenosis. Normal ophthalmic and posterior communicating artery
origins. Patent carotid termini, MCA and ACA origins. Anterior
communicating artery and bilateral ACA branches are within normal
limits allowing for the contrast bolus. Left MCA M1 segment and
bifurcation are patent. Right MCA M1 segment and bifurcation are
patent. Bilateral MCA branches are within normal limits.

Venous sinuses: Patent, with venous predominant intracranial
contrast timing.

Anatomic variants: None.

Review of the MIP images confirms the above findings
IMPRESSION: 1. Stable and unremarkable CT appearance of the brain - raising the
likelihood of meningitis as etiology of the widespread subarachnoid
abnormality on MRI 2 days ago.

2. Minor atherosclerosis at both ICA origins but otherwise negative
CTA Head and Neck, no arterial stenosis identified.

3. Advanced C5-C6 cervical disc and endplate degeneration.

## 2021-04-21 MED ORDER — ACETAMINOPHEN 325 MG PO TABS
650.0000 mg | ORAL_TABLET | Freq: Four times a day (QID) | ORAL | Status: DC | PRN
  Administered 2021-04-21: 650 mg via ORAL
  Filled 2021-04-21: qty 2

## 2021-04-21 MED ORDER — IOHEXOL 350 MG/ML SOLN
60.0000 mL | Freq: Once | INTRAVENOUS | Status: AC | PRN
  Administered 2021-04-21: 60 mL via INTRAVENOUS

## 2021-04-21 NOTE — Progress Notes (Signed)
PROGRESS NOTE    Clayton JanuaryRodney Clayson  WUJ:[id number] DOB: 10-06-1971 DOA: 04/17/2021 [facility]   Chief complaint.  Severe headache Brief Narrative:  Clayton [name] is a 50 y.o. male with medical history significant of hypertension, diabetes mellitus, CKD stage IIIb, who presents with nausea, vomiting, headache, right-sided neck pain. Upon arriving the hospital, blood pressure was 247/115.  Patient was continued on home medicines, was also given labetalol IV push, then she was placed on nicardipine drip. MRI brain was performed, showed microhemorrhages, I discussed the case with Dr. Otelia LimesLindzen, he has reviewed the MRI images, currently there is no concern for future bleeding.  Okay to continue aspirin and a prophylactic heparin. Carotid MRA did not show any carotid dissection. 7/1.  Discontinued nicardipine, started on clonidine 0.2 mg twice a day.  Increase insulin dose. 7/2.  Patient developed myoclonic and seizure x2, seen by neurology, started Keppra.  MRI obtained.  Blood culture positive for gram-positive cocci. 7/4.  Blood culture is also identified as Staphylococcus capitis.   Assessment & Plan:   Principal Problem:   Hypertensive emergency Active Problems:   Nausea & vomiting   Acute metabolic encephalopathy   CKD (chronic kidney disease), stage IIIb   Type II diabetes mellitus with renal manifestations (HCC)   Leukocytosis   Elevated lactic acid level   Elevated troponin   Seizure (HCC)   Bacteremia due to Gram-positive bacteria   Hyponatremia   Acute urinary retention   Hypertensive encephalopathy  #1.  Hypertensive emergency. Hypertensive encephalopathy.   Elevated troponin secondary to hypertensive emergency. Intracranial microhemorrhage. Patient condition had improved, blood pressures stabilized. Patient also has some neck pain, CT angiogram was performed after discussion with Dr. Cherylann RatelLateef, no carotid artery dissection.  2.  Staph capitis  bacteremia. Possible bacterial meningitis. Discussed with microbiology, all bottles are positive for Staphylococcus capitis, only 1 bottle was positive for staph epidermidis. Staph epidermis is contaminant, but not sure of Staph capitis.  I will continue current coverage with Rocephin, pending formal consult from ID tomorrow. Neurology does not believe patient has meningitis. Will make decision tomorrow after consult from ID to determine if continued antibiotics needed.  #3.  Myoclonic seizure. Continue Keppra.  EEG is pending for tomorrow.  4.  Chronic kidney disease stage IIIb. Renal function improving. I will continue some IV fluids, patient had a contrast.  I will recheck renal function tomorrow.  5.  Uncontrolled type 2 diabetes with hyperglycemia Glucose much better.  DVT prophylaxis: SCDs Code Status: Full Family Communication:  Disposition Plan:    Status is: Inpatient  Remains inpatient appropriate because:Inpatient level of care appropriate due to severity of illness  Dispo: The patient is from: Home              Anticipated d/c is to: Home              Patient currently is not medically stable to d/c.   Difficult to place patient No        I/O last 3 completed shifts: In: 2267 [I.V.:1873.8; IV Piggyback:393.2] Out: 1950 [Urine:1950] Total I/O In: 240 [P.O.:240] Out: -      Consultants:  Neurology  Procedures: None  Antimicrobials: Rocephin  Subjective: Patient condition much improved, headache much better.  No dizziness. No fever or chills. No short of breath or cough. No dysuria hematuria. No abdominal pain nausea vomiting.  Objective: Vitals:   04/20/21 1900 04/20/21 2100 04/21/21 0419 04/21/21 0828  BP: (!) 165/90 (!) 184/99 Marland Kitchen(!)  185/93 (!) 173/92  Pulse: (!) 59 (!) 58 66 79  Resp: 16 18 17 17   Temp:  97.8 F (36.6 C) 97.8 F (36.6 C) (!) 97.5 F (36.4 C)  TempSrc:  Oral Oral Oral  SpO2: 97% 100% 98% 98%  Weight:      Height:         Intake/Output Summary (Last 24 hours) at 04/21/2021 1205 Last data filed at 04/21/2021 0900 Gross per 24 hour  Intake 965.19 ml  Output 1450 ml  Net -484.81 ml   Filed Weights   04/17/21 0927 04/17/21 2300  Weight: 119.3 kg 119.3 kg    Examination:  General exam: Appears calm and comfortable  Respiratory system: Clear to auscultation. Respiratory effort normal. Cardiovascular system: S1 & S2 heard, RRR. No JVD, murmurs, rubs, gallops or clicks. No pedal edema. Gastrointestinal system: Abdomen is nondistended, soft and nontender. No organomegaly or masses felt. Normal bowel sounds heard. Central nervous system: Alert and oriented. No focal neurological deficits. Extremities: Symmetric 5 x 5 power. Skin: No rashes, lesions or ulcers Psychiatry: Judgement and insight appear normal. Mood & affect appropriate.     Data Reviewed: I have personally reviewed following labs and imaging studies  CBC: Recent Labs  Lab 04/17/21 0940 04/18/21 0440 04/19/21 0416 04/20/21 0355 04/21/21 0445  WBC 17.3* 12.5* 12.3* 8.7 9.6  NEUTROABS  --   --  9.1* 5.9 7.3  HGB 16.1 14.9 13.8 12.6* 13.2  HCT 46.0 41.8 39.3 37.0* 38.1*  MCV 89.7 90.5 90.1 92.3 91.1  PLT 252 230 215 205 213   Basic Metabolic Panel: Recent Labs  Lab 04/17/21 0940 04/18/21 0440 04/19/21 0416 04/20/21 0355 04/21/21 0445  NA 132* 130* 133* 133* 134*  K 4.3 3.6 4.1 3.7 3.8  CL 88* 97* 99 104 103  CO2 28 25 26 23 22   GLUCOSE 441* 364* 218* 178* 191*  BUN 30* 35* 36* 33* 26*  CREATININE 2.37* 2.56* 2.57* 2.11* 2.01*  CALCIUM 9.8 8.5* 8.5* 8.2* 8.4*  MG  --   --  2.3 2.2 2.2   GFR: Estimated Creatinine Clearance: 59.1 mL/min (A) (by C-G formula based on SCr of 2.01 mg/dL (H)). Liver Function Tests: No results for input(s): AST, ALT, ALKPHOS, BILITOT, PROT, ALBUMIN in the last 168 hours. Recent Labs  Lab 04/17/21 0940  LIPASE 27   No results for input(s): AMMONIA in the last 168 hours. Coagulation  Profile: Recent Labs  Lab 04/17/21 0940  INR 1.0   Cardiac Enzymes: No results for input(s): CKTOTAL, CKMB, CKMBINDEX, TROPONINI in the last 168 hours. BNP (last 3 results) No results for input(s): PROBNP in the last 8760 hours. HbA1C: No results for input(s): HGBA1C in the last 72 hours. CBG: Recent Labs  Lab 04/20/21 0754 04/20/21 1140 04/20/21 1618 04/20/21 2216 04/21/21 0831  GLUCAP 216* 229* 167* 162* 180*   Lipid Profile: No results for input(s): CHOL, HDL, LDLCALC, TRIG, CHOLHDL, LDLDIRECT in the last 72 hours. Thyroid Function Tests: No results for input(s): TSH, T4TOTAL, FREET4, T3FREE, THYROIDAB in the last 72 hours. Anemia Panel: No results for input(s): VITAMINB12, FOLATE, FERRITIN, TIBC, IRON, RETICCTPCT in the last 72 hours. Sepsis Labs: Recent Labs  Lab 04/17/21 0940 04/17/21 1218 04/17/21 1522 04/17/21 1638 04/17/21 1945 04/17/21 2215  PROCALCITON <0.10  --   --   --   --   --   LATICACIDVEN  --    < > 3.0* 2.9* 3.7* 2.5*   < > = values in this  interval not displayed.    Recent Results (from the past 240 hour(s))  Resp Panel by RT-PCR (Flu A&B, Covid) Nasopharyngeal Swab     Status: None   Collection Time: 04/17/21  9:40 AM   Specimen: Nasopharyngeal Swab; Nasopharyngeal(NP) swabs in vial transport medium  Result Value Ref Range Status   SARS Coronavirus 2 by RT PCR NEGATIVE NEGATIVE Final    Comment: (NOTE) SARS-CoV-2 target nucleic acids are NOT DETECTED.  The SARS-CoV-2 RNA is generally detectable in upper respiratory specimens during the acute phase of infection. The lowest concentration of SARS-CoV-2 viral copies this assay can detect is 138 copies/mL. A negative result does not preclude SARS-Cov-2 infection and should not be used as the sole basis for treatment or other patient management decisions. A negative result may occur with  improper specimen collection/handling, submission of specimen other than nasopharyngeal swab, presence of  viral mutation(s) within the areas targeted by this assay, and inadequate number of viral copies(<138 copies/mL). A negative result must be combined with clinical observations, patient history, and epidemiological information. The expected result is Negative.  Fact Sheet for Patients:  BloggerCourse.com  Fact Sheet for Healthcare Providers:  SeriousBroker.it  This test is no t yet approved or cleared by the [place] FDA and  has been authorized for detection and/or diagnosis of SARS-CoV-2 by FDA under an Emergency Use Authorization (EUA). This EUA will remain  in effect (meaning this test can be used) for the duration of the COVID-19 declaration under Section 564(b)(1) of the Act, 21 U.S.C.section 360bbb-3(b)(1), unless the authorization is terminated  or revoked sooner.       Influenza A by PCR NEGATIVE NEGATIVE Final   Influenza B by PCR NEGATIVE NEGATIVE Final    Comment: (NOTE) The Xpert Xpress SARS-CoV-2/FLU/RSV plus assay is intended as an aid in the diagnosis of influenza from Nasopharyngeal swab specimens and should not be used as a sole basis for treatment. Nasal washings and aspirates are unacceptable for Xpert Xpress SARS-CoV-2/FLU/RSV testing.  Fact Sheet for Patients: BloggerCourse.com  Fact Sheet for Healthcare Providers: SeriousBroker.it  This test is not yet approved or cleared by the [place] FDA and has been authorized for detection and/or diagnosis of SARS-CoV-2 by FDA under an Emergency Use Authorization (EUA). This EUA will remain in effect (meaning this test can be used) for the duration of the COVID-19 declaration under Section 564(b)(1) of the Act, 21 U.S.C. section 360bbb-3(b)(1), unless the authorization is terminated or revoked.  Performed at [facility], [address] [place], [place] [address]   CULTURE, BLOOD (ROUTINE X  2) w Reflex to ID Panel     Status: Abnormal (Preliminary result)   Collection Time: 04/17/21  4:34 PM   Specimen: BLOOD  Result Value Ref Range Status   Specimen Description   Final    BLOOD RIGHT ANTECUBITAL Performed at [facility], [address] [place], [place] [address]    Special Requests   Final    BOTTLES DRAWN AEROBIC AND ANAEROBIC Blood Culture results may not be optimal due to an inadequate volume of blood received in culture bottles Performed at [facility], [address] [place], [place] [address]    Culture  Setup Time   Final    GRAM POSITIVE COCCI ANAEROBIC BOTTLE ONLY CRITICAL VALUE NOTED.  VALUE IS CONSISTENT WITH PREVIOUSLY REPORTED AND CALLED VALUE. Performed at [facility], [address], [place], [place] [address]    Culture STAPHYLOCOCCUS CAPITIS (A)  Final  Report Status PENDING  Incomplete  CULTURE, BLOOD (ROUTINE X 2) w Reflex to ID Panel     Status: Abnormal (Preliminary result)   Collection Time: 04/17/21  4:38 PM   Specimen: BLOOD  Result Value Ref Range Status   Specimen Description   Final    BLOOD LEFT ANTECUBITAL Performed at [facility], [address], [place], [place] [address]    Special Requests   Final    BOTTLES DRAWN AEROBIC AND ANAEROBIC Blood Culture results may not be optimal due to an inadequate volume of blood received in culture bottles Performed at [facility], [address], [place], [place] [address]    Culture  Setup Time   Final    GRAM POSITIVE COCCI IN BOTH AEROBIC AND ANAEROBIC BOTTLES CRITICAL RESULT CALLED TO, READ BACK BY AND VERIFIED WITH: SUSAN WATSON  ON 07.01.22.SH    Culture (A)  Final    STAPHYLOCOCCUS CAPITIS STAPHYLOCOCCUS EPIDERMIDIS THE SIGNIFICANCE OF ISOLATING THIS ORGANISM FROM A SINGLE SET OF BLOOD CULTURES WHEN MULTIPLE SETS ARE DRAWN IS UNCERTAIN. PLEASE NOTIFY THE MICROBIOLOGY DEPARTMENT WITHIN ONE WEEK IF SPECIATION AND SENSITIVITIES  ARE REQUIRED. SUSCEPTIBILITIES TO FOLLOW FOR STAPHYLOCOCCUS CAPITIS Performed at [facility], [address], [place], [place] [address]    Report Status PENDING  Incomplete  Blood Culture ID Panel (Reflexed)     Status: Abnormal   Collection Time: 04/17/21  4:38 PM  Result Value Ref Range Status   Enterococcus faecalis NOT DETECTED NOT DETECTED Final   Enterococcus Faecium NOT DETECTED NOT DETECTED Final   Listeria monocytogenes NOT DETECTED NOT DETECTED Final   Staphylococcus species DETECTED (A) NOT DETECTED Final    Comment: CRITICAL RESULT CALLED TO, READ BACK BY AND VERIFIED WITH: SUSAN WATSON  ON 07.01.22.SH    Staphylococcus aureus (BCID) NOT DETECTED NOT DETECTED Final   Staphylococcus epidermidis DETECTED (A) NOT DETECTED Final    Comment: CRITICAL RESULT CALLED TO, READ BACK BY AND VERIFIED WITH: SUSAN WATSON  ON 07.01.22.SH    Staphylococcus lugdunensis NOT DETECTED NOT DETECTED Final   Streptococcus species NOT DETECTED NOT DETECTED Final   Streptococcus agalactiae NOT DETECTED NOT DETECTED Final   Streptococcus pneumoniae NOT DETECTED NOT DETECTED Final   Streptococcus pyogenes NOT DETECTED NOT DETECTED Final   A.calcoaceticus-baumannii NOT DETECTED NOT DETECTED Final   Bacteroides fragilis NOT DETECTED NOT DETECTED Final   Enterobacterales NOT DETECTED NOT DETECTED Final   Enterobacter cloacae complex NOT DETECTED NOT DETECTED Final   Escherichia coli NOT DETECTED NOT DETECTED Final   Klebsiella aerogenes NOT DETECTED NOT DETECTED Final   Klebsiella oxytoca NOT DETECTED NOT DETECTED Final   Klebsiella pneumoniae NOT DETECTED NOT DETECTED Final   Proteus species NOT DETECTED NOT DETECTED Final   Salmonella species NOT DETECTED NOT DETECTED Final   Serratia marcescens NOT DETECTED NOT DETECTED Final   Haemophilus influenzae NOT DETECTED NOT DETECTED Final   Neisseria meningitidis NOT DETECTED NOT DETECTED Final   Pseudomonas aeruginosa NOT DETECTED  NOT DETECTED Final   Stenotrophomonas maltophilia NOT DETECTED NOT DETECTED Final   Candida albicans NOT DETECTED NOT DETECTED Final   Candida auris NOT DETECTED NOT DETECTED Final   Candida glabrata NOT DETECTED NOT DETECTED Final   Candida krusei NOT DETECTED NOT DETECTED Final   Candida parapsilosis NOT DETECTED NOT DETECTED Final   Candida tropicalis NOT DETECTED NOT DETECTED Final   Cryptococcus neoformans/gattii NOT DETECTED NOT DETECTED Final   Methicillin resistance mecA/C NOT DETECTED NOT DETECTED Final  Comment: Performed at [facility], [address] [place], [place] [address]  MRSA Next Gen by PCR, Nasal     Status: None   Collection Time: 04/18/21  1:37 AM   Specimen: Nasal Mucosa; Nasal Swab  Result Value Ref Range Status   MRSA by PCR Next Gen NOT DETECTED NOT DETECTED Final    Comment: (NOTE) The GeneXpert MRSA Assay (FDA approved for NASAL specimens only), is one component of a comprehensive MRSA colonization surveillance program. It is not intended to diagnose MRSA infection nor to guide or monitor treatment for MRSA infections. Test performance is not FDA approved in patients less than 67 years old. Performed at [facility], [address], [place], [place] [address]   Urine Culture     Status: None   Collection Time: 04/19/21 11:00 AM   Specimen: Urine, Random  Result Value Ref Range Status   Specimen Description   Final    URINE, RANDOM Performed at [facility], [address], [place], [place] [address]    Special Requests   Final    NONE Performed at [facility], [address] [place], [place] [address]    Culture   Final    NO GROWTH Performed at [facility], [address] [place] [address], [place], [place] [address]    Report Status 04/20/2021 FINAL  Final  CULTURE, BLOOD (ROUTINE X 2) w Reflex to ID Panel     Status: None (Preliminary result)   Collection Time: 04/20/21 12:01 AM   Specimen: BLOOD  Result  Value Ref Range Status   Specimen Description BLOOD BLOOD RIGHT FOREARM  Final   Special Requests   Final    BOTTLES DRAWN AEROBIC AND ANAEROBIC Blood Culture adequate volume   Culture   Final    NO GROWTH 1 DAY Performed at [facility], [address] [place], [place] [address]    Report Status PENDING  Incomplete  CULTURE, BLOOD (ROUTINE X 2) w Reflex to ID Panel     Status: None (Preliminary result)   Collection Time: 04/20/21 12:02 AM   Specimen: BLOOD  Result Value Ref Range Status   Specimen Description BLOOD BLOOD RIGHT HAND  Final   Special Requests   Final    BOTTLES DRAWN AEROBIC AND ANAEROBIC Blood Culture adequate volume   Culture   Final    NO GROWTH 1 DAY Performed at [facility], [address], [place], [place] [address]    Report Status PENDING  Incomplete         Radiology Studies: CT ANGIO HEAD NECK W WO CM  Result Date: 04/21/2021 CLINICAL DATA:  50 year old male with seizure and left side weakness. Hypertensive. Positive blood cultures for gram positive cocci. Abnormal bilateral cerebral sulci or leptomeninges on MRI 04/19/2021. Chronic microhemorrhages. EXAM: CT ANGIOGRAPHY HEAD AND NECK TECHNIQUE: Multidetector CT imaging of the head and neck was performed using the standard protocol during bolus administration of intravenous contrast. Multiplanar CT image reconstructions and MIPs were obtained to evaluate the vascular anatomy. Carotid stenosis measurements (when applicable) are obtained utilizing NASCET criteria, using the distal internal carotid diameter as the denominator. CONTRAST:  60mL OMNIPAQUE IOHEXOL 350 MG/ML SOLN COMPARISON:  Brain MRI 04/19/2021.  Head CT 04/18/2021 and earlier. FINDINGS: CT HEAD Brain: Cerebral volume is stable and within normal limits. No midline shift, ventriculomegaly, mass effect, evidence of mass lesion, intracranial hemorrhage or evidence of cortically based acute infarction. Gray-white matter  differentiation is within normal  limits throughout the brain. No hyperdense hemorrhage or CT correlation to the abnormal sulcal appearance on MR FLAIR imaging. Calvarium and skull base: No acute osseous abnormality identified. Paranasal sinuses: Visualized paranasal sinuses and mastoids are clear. Orbits: No acute orbit or scalp soft tissue finding. CTA NECK Skeleton: No acute osseous abnormality identified. Advanced chronic C5-C6 disc and endplate degeneration. Upper chest: Negative lung apices and superior mediastinum. Other neck: Negative. Aortic arch: 3 vessel arch configuration.  No arch atherosclerosis. Right carotid system: Negative brachiocephalic artery. Tortuous proximal right CCA. Minimal calcified plaque at the right ICA bulb. Mildly tortuous distal right ICA without stenosis. Left carotid system: Negative aside from mild tortuosity and mild left ICA bulb calcified plaque similar to the right side. Vertebral arteries: Normal proximal right subclavian artery and right vertebral artery origin. Right V1 segment obscured by paravertebral venous contrast. But otherwise the cervical right vertebral artery appears patent and within normal limits to the skull base. Normal proximal left subclavian artery and left vertebral artery origin. Mildly tortuous left vertebral is patent without plaque or stenosis to the skull base. CTA HEAD Suboptimal intracranial arterial contrast bolus. Posterior circulation: Fairly codominant distal vertebral arteries are patent to the basilar without plaque or stenosis. Both a ICAs appear dominant. Patent basilar artery without stenosis. Normal SCA and PCA origins. Both posterior communicating arteries are present. Bilateral PCA branches are within normal limits. Anterior circulation: Both ICA siphons are patent. No siphon plaque or stenosis. Normal ophthalmic and posterior communicating artery origins. Patent carotid termini, MCA and ACA origins. Anterior communicating artery and  bilateral ACA branches are within normal limits allowing for the contrast bolus. Left MCA M1 segment and bifurcation are patent. Right MCA M1 segment and bifurcation are patent. Bilateral MCA branches are within normal limits. Venous sinuses: Patent, with venous predominant intracranial contrast timing. Anatomic variants: None. Review of the MIP images confirms the above findings IMPRESSION: 1. Stable and unremarkable CT appearance of the brain - raising the likelihood of meningitis as etiology of the widespread subarachnoid abnormality on MRI 2 days ago. 2. Minor atherosclerosis at both ICA origins but otherwise negative CTA Head and Neck, no arterial stenosis identified. 3. 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 Electronically Signed   By: Odessa Fleming M.D.   On: 04/21/2021 08:47   MR BRAIN W WO CONTRAST  Result Date: 04/19/2021 CLINICAL DATA:  Seizure, left-sided weakness EXAM: MRI HEAD WITHOUT AND WITH CONTRAST TECHNIQUE: Multiplanar, multiecho pulse sequences of the brain and surrounding structures were obtained without and with intravenous contrast. CONTRAST:  10mL GADAVIST GADOBUTROL 1 MMOL/ML IV SOLN COMPARISON:  04/17/2021 FINDINGS: Brain: There is no acute infarction or intracranial hemorrhage. There is no intracranial mass, mass effect, or edema. There is no hydrocephalus or extra-axial fluid collection. Ventricles and sulci are normal in size and configuration. There are numerous foci of susceptibility involving the cerebral white matter, central gray nuclei, brainstem, and cerebellum. Patchy foci of T2 hyperintensity in the supratentorial white matter nonspecific but may reflect mild chronic microvascular ischemic changes. There is abnormal sulcal T2 FLAIR hyperintensity. No abnormal leptomeningeal enhancement. No abnormal parenchymal enhancement. Vascular: Major vessel flow voids at the skull base are preserved. Skull and upper cervical spine: Normal marrow signal is preserved.  Sinuses/Orbits: Paranasal sinuses are aerated. Bilateral lens replacements. Other: Sella is unremarkable.  Mastoid air cells are clear. 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 Although there is no corresponding abnormal diffusion or enhancement, consider meningitis given positive blood cultures.  Unchanged numerous foci of susceptibility. May reflect chronic microhemorrhages secondary to chronic hypertension. Embolic material is also a consideration. Correlate with echocardiography. Electronically Signed   By: Guadlupe Spanish M.D.   On: 04/19/2021 15:08   ECHOCARDIOGRAM COMPLETE  Result Date: 04/20/2021    ECHOCARDIOGRAM REPORT   Patient Name:   [facility] Fofana Date of Exam: 04/19/2021 Medical Rec #:  [id number]      Height:       71.0 in Accession #:    [id number]     Weight:       263.0 lb Date of Birth:  09-Mar-1971      BSA:          2.369 m Patient Age:    49 years       BP:           141/91 mmHg Patient Gender: M              HR:           64 bpm. Exam Location:  [facility] Procedure: 2D Echo Indications:     Bacteremia R78.81  History:         Patient has no prior history of Echocardiogram examinations.  Sonographer:     Overton Mam RDCS Referring Phys:  [id number] CARMEN Knox Saliva Diagnosing Phys: Julien Nordmann MD IMPRESSIONS  1. Left ventricular ejection fraction, by estimation, is 60 to 65%. The left ventricle has normal function. The left ventricle has no regional wall motion abnormalities. There is moderate left ventricular hypertrophy. Left ventricular diastolic parameters are consistent with Grade I diastolic dysfunction (impaired relaxation).  2. Right ventricular systolic function is normal. The right ventricular size is normal. Tricuspid regurgitation signal is inadequate for assessing PA pressure.  3. No valve vegetation noted. FINDINGS  Left Ventricle: Left ventricular ejection fraction, by estimation, is 60 to 65%. The left ventricle has normal function.  The left ventricle has no regional wall motion abnormalities. The left ventricular internal cavity size was normal in size. There is  moderate left ventricular hypertrophy. Left ventricular diastolic parameters are consistent with Grade I diastolic dysfunction (impaired relaxation). Right Ventricle: The right ventricular size is normal. No increase in right ventricular wall thickness. Right ventricular systolic function is normal. Tricuspid regurgitation signal is inadequate for assessing PA pressure. Left Atrium: Left atrial size was normal in size. Right Atrium: Right atrial size was normal in size. Pericardium: There is no evidence of pericardial effusion. Mitral Valve: The mitral valve is normal in structure. No evidence of mitral valve regurgitation. No evidence of mitral valve stenosis. Tricuspid Valve: The tricuspid valve is normal in structure. Tricuspid valve regurgitation is not demonstrated. No evidence of tricuspid stenosis. Aortic Valve: The aortic valve is normal in structure. Aortic valve regurgitation is not visualized. No aortic stenosis is present. Aortic valve peak gradient measures 4.0 mmHg. Pulmonic Valve: The pulmonic valve was normal in structure. Pulmonic valve regurgitation is not visualized. No evidence of pulmonic stenosis. Aorta: The aortic root is normal in size and structure. Venous: The pulmonary veins were not well visualized. The inferior vena cava is normal in size with greater than 50% respiratory variability, suggesting right atrial pressure of 3 mmHg. IAS/Shunts: No atrial level shunt detected by color flow Doppler.  LEFT VENTRICLE PLAX 2D LVIDd:         4.95 cm  Diastology LVIDs:         3.42 cm  LV e' medial:    4.13 cm/s LV PW:  1.68 cm  LV E/e' medial:  13.2 LV IVS:        1.78 cm  LV e' lateral:   4.24 cm/s LVOT diam:     1.90 cm  LV E/e' lateral: 12.8 LV SV:         49 LV SV Index:   21 LVOT Area:     2.84 cm  RIGHT VENTRICLE RV Basal diam:  2.32 cm RV S prime:      14.30 cm/s TAPSE (M-mode): 1.6 cm LEFT ATRIUM           Index       RIGHT ATRIUM          Index LA diam:      3.50 cm 1.48 cm/m  RA Area:     9.24 cm LA Vol (A2C): 17.0 ml 7.18 ml/m  RA Volume:   17.00 ml 7.18 ml/m LA Vol (A4C): 36.9 ml 15.58 ml/m  AORTIC VALVE                PULMONIC VALVE AV Area (Vmax): 2.65 cm    PV Vmax:       0.92 m/s AV Vmax:        100.00 cm/s PV Peak grad:  3.4 mmHg AV Peak Grad:   4.0 mmHg LVOT Vmax:      93.30 cm/s LVOT Vmean:     59.200 cm/s LVOT VTI:       0.174 m  AORTA Ao Root diam: 2.90 cm Ao Asc diam:  3.20 cm MITRAL VALVE MV Area (PHT): 2.69 cm    SHUNTS MV Decel Time: 282 msec    Systemic VTI:  0.17 m MV E velocity: 54.40 cm/s  Systemic Diam: 1.90 cm MV A velocity: 85.30 cm/s MV E/A ratio:  0.64 Julien Nordmann MD Electronically signed by Julien Nordmann MD Signature Date/Time: 04/20/2021/9:34:35 AM    Final         Scheduled Meds:  amLODipine  5 mg Oral Daily   carvedilol  25 mg Oral BID WC   Chlorhexidine Gluconate Cloth  6 each Topical Daily   cholecalciferol  2,000 Units Oral Daily   cloNIDine  0.3 mg Transdermal Weekly   heparin  5,000 Units Subcutaneous Q8H   insulin aspart  0-5 Units Subcutaneous QHS   insulin aspart  0-9 Units Subcutaneous TID WC   insulin glargine  15 Units Subcutaneous Daily   losartan  100 mg Oral Daily   tamsulosin  0.4 mg Oral Daily   Continuous Infusions:  sodium chloride 75 mL/hr at 04/21/21 0535   cefTRIAXone (ROCEPHIN)  IV 2 g (04/21/21 1033)   levETIRAcetam 1,000 mg (04/21/21 0950)     LOS: 4 days    Time spent: 32 minutes, more than 50% time involved in direct patient care.    Marrion Coy, MD [facility]   To contact the attending provider between 7A-7P or the covering provider during after hours 7P-7A, please log into the web site [email or web address] and access using universal [place] password for that web site. If you do not have the password, please call the hospital operator.  04/21/2021, 12:05 PM

## 2021-04-21 NOTE — Plan of Care (Signed)
  Problem: Education: Goal: Knowledge of General Education information will improve Description: Including pain rating scale, medication(s)/side effects and non-pharmacologic comfort measures 04/21/2021 1017 by Ansel Bong, RN Outcome: Progressing 04/21/2021 1017 by Ansel Bong, RN Outcome: Progressing   Problem: Health Behavior/Discharge Planning: Goal: Ability to manage health-related needs will improve 04/21/2021 1017 by Ansel Bong, RN Outcome: Progressing 04/21/2021 1017 by Ansel Bong, RN Outcome: Progressing   Problem: Clinical Measurements: Goal: Ability to maintain clinical measurements within normal limits will improve 04/21/2021 1017 by Ansel Bong, RN Outcome: Progressing 04/21/2021 1017 by Ansel Bong, RN Outcome: Progressing Goal: Will remain free from infection 04/21/2021 1017 by Ansel Bong, RN Outcome: Progressing 04/21/2021 1017 by Ansel Bong, RN Outcome: Progressing Goal: Diagnostic test results will improve 04/21/2021 1017 by Ansel Bong, RN Outcome: Progressing 04/21/2021 1017 by Ansel Bong, RN Outcome: Progressing Goal: Respiratory complications will improve 04/21/2021 1017 by Ansel Bong, RN Outcome: Progressing 04/21/2021 1017 by Ansel Bong, RN Outcome: Progressing Goal: Cardiovascular complication will be avoided 04/21/2021 1017 by Ansel Bong, RN Outcome: Progressing 04/21/2021 1017 by Ansel Bong, RN Outcome: Progressing   Problem: Activity: Goal: Risk for activity intolerance will decrease 04/21/2021 1017 by Ansel Bong, RN Outcome: Progressing 04/21/2021 1017 by Ansel Bong, RN Outcome: Progressing   Problem: Nutrition: Goal: Adequate nutrition will be maintained 04/21/2021 1017 by Ansel Bong, RN Outcome: Progressing 04/21/2021 1017 by Ansel Bong, RN Outcome: Progressing   Problem: Coping: Goal: Level of anxiety will decrease 04/21/2021 1017 by Ansel Bong, RN Outcome: Progressing 04/21/2021 1017 by  Ansel Bong, RN Outcome: Progressing   Problem: Elimination: Goal: Will not experience complications related to bowel motility 04/21/2021 1017 by Ansel Bong, RN Outcome: Progressing 04/21/2021 1017 by Ansel Bong, RN Outcome: Progressing Goal: Will not experience complications related to urinary retention 04/21/2021 1017 by Ansel Bong, RN Outcome: Progressing 04/21/2021 1017 by Ansel Bong, RN Outcome: Progressing   Problem: Pain Managment: Goal: General experience of comfort will improve 04/21/2021 1017 by Ansel Bong, RN Outcome: Progressing 04/21/2021 1017 by Ansel Bong, RN Outcome: Progressing   Problem: Safety: Goal: Ability to remain free from injury will improve 04/21/2021 1017 by Ansel Bong, RN Outcome: Progressing 04/21/2021 1017 by Ansel Bong, RN Outcome: Progressing   Problem: Skin Integrity: Goal: Risk for impaired skin integrity will decrease 04/21/2021 1017 by Ansel Bong, RN Outcome: Progressing 04/21/2021 1017 by Ansel Bong, RN Outcome: Progressing   Problem: Safety: Goal: Non-violent Restraint(s) 04/21/2021 1017 by Ansel Bong, RN Outcome: Progressing 04/21/2021 1017 by Ansel Bong, RN Outcome: Progressing

## 2021-04-21 NOTE — Progress Notes (Signed)
Neurology Progress Note  Patient ID: 50 yo man with new onset GTC in setting of admission for hypertensive urgency with severe headache and nausea after abrupt onset of severe headache during spell of vomiting last week. 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  Repeat MRI brain 7/2 showed interval development of T2/FLAIR sulcal hyper intensity in bilateral occipital lobes with no corresponding leptomeningeal enhancement c/f proteinaceous fluid vs artifact vs potential subarachnoid blood. Pt has been afebrile. Leukocytosis present on admission has resolved. MRA H&N and subsequent CTA H&N showed no e/o aneurysm or dissection. Patient is at mental status baseline and has no focal neurologic deficits.   Subjective: - Pt denies current neurologic deficits. Wife at bedside states he is back to mental status baseline. No headache currently. Interval data: - MRA H&N and subsequent CTA H&N showed no e/o aneurysm or dissection.  - Blood cx (+) for staph capitus, ID consult pending - No valve vegetations on TTE, TEE pending  Exam: Vitals:   04/21/21 1224 04/21/21 1952  BP: (!) 159/88 (!) 177/98  Pulse: 62 62  Resp: 17   Temp: 98 F (36.7 C) 98 F (36.7 C)  SpO2:  99%    Physical Exam Gen: A&Ox4, NAD HEENT: Atraumatic, normocephalic; oropharynx clear, tongue without atrophy or fasciculations. Resp: CTAB, normal work of breathing CV: RRR, extremities appear well-perfused. Abd: soft/NT/ND Extrem: Nml bulk; no cyanosis, clubbing, or edema.  Neuro: *MS: A&O x4. Follows multi-step commands.  *Speech: no dysarthria or aphasia, able to name and repeat. *CN:    I: Deferred   II,III: PERRLA, VFF by confrontation, optic discs not visualized 2/2 pupillary constriction   III,IV,VI: EOMI w/o nystagmus, no ptosis   V: Sensation intact from V1 to V3 to LT   VII: Eyelid closure was full.  Smile symmetric.   VIII: Hearing intact to voice   IX,X: Voice normal, palate elevates  symmetrically    XI: SCM/trap 5/5 bilat   XII: Tongue protrudes midline, no atrophy or fasciculations  *Motor:   Normal bulk.  No tremor, rigidity or bradykinesia. No pronator drift.   Strength: Dlt Bic Tri WE WrF FgS Gr HF KnF KnE PlF DoF    Left 5 5 5 5 5 5 5 5 5 5 5 5     Right 5 5 5 5 5 5 5 5 5 5 5 5    *Sensory: Intact to light touch, pinprick, temperature vibration throughout. Symmetric. Propioception intact bilat.  No double-simultaneous extinction.  *Coordination:  Finger-to-nose, heel-to-shin, rapid alternating motions were intact. *Reflexes:  2+ and symmetric throughout without clonus; toes down-going bilat *Gait: deferred   Impression: 50 yo man with new onset GTC in setting of admission for hypertensive urgency with severe headache and nausea after abrupt onset of severe headache during spell of vomiting last week. 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  Repeat MRI brain 7/2 showed interval development of T2/FLAIR sulcal hyper intensity in bilateral occipital lobes with no corresponding leptomeningeal enhancement c/f proteinaceous fluid vs artifact vs potential subarachnoid blood. Pt has been afebrile. Leukocytosis present on admission has resolved. MRA H&N and subsequent CTA H&N showed no e/o aneurysm or dissection. Patient is at mental status baseline and has no focal neurologic deficits.   Recommendations: - MRA H&N and subsequent CTA H&N showed no e/o aneurysm or dissection.  - Continue holding anti platelets and pharmacologic DVT prophylaxis given possible SAH on MRI brain - Repeat CT, CTA in 1 week, consider catheter angiogram if CTA negative -  F/u ID consult, defer to them on antibiotic mgmt - Continue keppra 1000mg  bid - EEG tomorrow when tech is in-house - BP mgmt goal systolic <160 - STAT head CT for any decline in neurologic exam - No valve vegetations on TTE, TEE pending  Will continue to follow  , MD [facility] [phone]  If 7pm- 7am, please page neurology on call as listed in AMION.

## 2021-04-22 ENCOUNTER — Inpatient Hospital Stay: Payer: 59

## 2021-04-22 ENCOUNTER — Encounter
Admission: EM | Disposition: A | Discharge status: Home / Self Care | Payer: Self-pay | Source: Home / Self Care | Attending: Internal Medicine

## 2021-04-22 ENCOUNTER — Other Ambulatory Visit: Payer: Self-pay | Admitting: Family Medicine

## 2021-04-22 DIAGNOSIS — G9341 Metabolic encephalopathy: Secondary | ICD-10-CM

## 2021-04-22 DIAGNOSIS — I161 Hypertensive emergency: Principal | ICD-10-CM

## 2021-04-22 DIAGNOSIS — N1832 Chronic kidney disease, stage 3b: Secondary | ICD-10-CM | POA: Diagnosis not present

## 2021-04-22 DIAGNOSIS — E1122 Type 2 diabetes mellitus with diabetic chronic kidney disease: Secondary | ICD-10-CM

## 2021-04-22 DIAGNOSIS — R7881 Bacteremia: Secondary | ICD-10-CM

## 2021-04-22 LAB — BASIC METABOLIC PANEL
Anion gap: 4 — ABNORMAL LOW (ref 5–15)
BUN: 19 mg/dL (ref 6–20)
CO2: 25 mmol/L (ref 22–32)
Calcium: 8.3 mg/dL — ABNORMAL LOW (ref 8.9–10.3)
Chloride: 106 mmol/L (ref 98–111)
Creatinine, Ser: 1.78 mg/dL — ABNORMAL HIGH (ref 0.61–1.24)
GFR, Estimated: 46 mL/min — ABNORMAL LOW (ref >60)
Glucose, Bld: 186 mg/dL — ABNORMAL HIGH (ref 70–99)
Potassium: 4 mmol/L (ref 3.5–5.1)
Sodium: 135 mmol/L (ref 135–145)

## 2021-04-22 LAB — MAGNESIUM: Magnesium: 2.2 mg/dL (ref 1.7–2.4)

## 2021-04-22 LAB — URINE DRUG SCREEN, QUALITATIVE (ARMC ONLY)
Amphetamines, Ur Screen: NOT DETECTED
Barbiturates, Ur Screen: NOT DETECTED
Benzodiazepine, Ur Scrn: POSITIVE — AB
Cannabinoid 50 Ng, Ur ~~LOC~~: NOT DETECTED
Cocaine Metabolite,Ur ~~LOC~~: NOT DETECTED
MDMA (Ecstasy)Ur Screen: NOT DETECTED
Methadone Scn, Ur: NOT DETECTED
Opiate, Ur Screen: NOT DETECTED
Phencyclidine (PCP) Ur S: NOT DETECTED
Tricyclic, Ur Screen: NOT DETECTED

## 2021-04-22 LAB — GLUCOSE, CAPILLARY
Glucose-Capillary: 188 mg/dL — ABNORMAL HIGH (ref 70–99)
Glucose-Capillary: 197 mg/dL — ABNORMAL HIGH (ref 70–99)
Glucose-Capillary: 316 mg/dL — ABNORMAL HIGH (ref 70–99)
Glucose-Capillary: 372 mg/dL — ABNORMAL HIGH (ref 70–99)

## 2021-04-22 SURGERY — ECHOCARDIOGRAM, TRANSESOPHAGEAL
Anesthesia: Choice

## 2021-04-22 MED ORDER — ACETAMINOPHEN 325 MG PO TABS
650.0000 mg | ORAL_TABLET | Freq: Four times a day (QID) | ORAL | Status: DC | PRN
  Administered 2021-04-25: 650 mg via ORAL
  Filled 2021-04-22: qty 2

## 2021-04-22 MED ORDER — AMLODIPINE BESYLATE 10 MG PO TABS
10.0000 mg | ORAL_TABLET | Freq: Every day | ORAL | Status: DC
  Administered 2021-04-22 – 2021-04-25 (×4): 10 mg via ORAL
  Filled 2021-04-22 (×4): qty 1

## 2021-04-22 NOTE — Progress Notes (Signed)
Eeg completed

## 2021-04-22 NOTE — Plan of Care (Signed)
  Problem: Education: Goal: Knowledge of General Education information will improve Description: Including pain rating scale, medication(s)/side effects and non-pharmacologic comfort measures 04/22/2021 1006 by Ansel Bong, RN Outcome: Progressing 04/22/2021 1006 by Ansel Bong, RN Outcome: Progressing   Problem: Health Behavior/Discharge Planning: Goal: Ability to manage health-related needs will improve 04/22/2021 1006 by Ansel Bong, RN Outcome: Progressing 04/22/2021 1006 by Ansel Bong, RN Outcome: Progressing   Problem: Clinical Measurements: Goal: Ability to maintain clinical measurements within normal limits will improve 04/22/2021 1006 by Ansel Bong, RN Outcome: Progressing 04/22/2021 1006 by Ansel Bong, RN Outcome: Progressing Goal: Will remain free from infection 04/22/2021 1006 by Ansel Bong, RN Outcome: Progressing 04/22/2021 1006 by Ansel Bong, RN Outcome: Progressing Goal: Diagnostic test results will improve 04/22/2021 1006 by Ansel Bong, RN Outcome: Progressing 04/22/2021 1006 by Ansel Bong, RN Outcome: Progressing Goal: Respiratory complications will improve 04/22/2021 1006 by Ansel Bong, RN Outcome: Progressing 04/22/2021 1006 by Ansel Bong, RN Outcome: Progressing Goal: Cardiovascular complication will be avoided 04/22/2021 1006 by Ansel Bong, RN Outcome: Progressing 04/22/2021 1006 by Ansel Bong, RN Outcome: Progressing   Problem: Activity: Goal: Risk for activity intolerance will decrease 04/22/2021 1006 by Ansel Bong, RN Outcome: Progressing 04/22/2021 1006 by Ansel Bong, RN Outcome: Progressing   Problem: Nutrition: Goal: Adequate nutrition will be maintained 04/22/2021 1006 by Ansel Bong, RN Outcome: Progressing 04/22/2021 1006 by Ansel Bong, RN Outcome: Progressing   Problem: Coping: Goal: Level of anxiety will decrease 04/22/2021 1006 by Ansel Bong, RN Outcome: Progressing 04/22/2021 1006 by  Ansel Bong, RN Outcome: Progressing   Problem: Elimination: Goal: Will not experience complications related to bowel motility 04/22/2021 1006 by Ansel Bong, RN Outcome: Progressing 04/22/2021 1006 by Ansel Bong, RN Outcome: Progressing Goal: Will not experience complications related to urinary retention 04/22/2021 1006 by Ansel Bong, RN Outcome: Progressing 04/22/2021 1006 by Ansel Bong, RN Outcome: Progressing   Problem: Pain Managment: Goal: General experience of comfort will improve 04/22/2021 1006 by Ansel Bong, RN Outcome: Progressing 04/22/2021 1006 by Ansel Bong, RN Outcome: Progressing   Problem: Safety: Goal: Ability to remain free from injury will improve 04/22/2021 1006 by Ansel Bong, RN Outcome: Progressing 04/22/2021 1006 by Ansel Bong, RN Outcome: Progressing   Problem: Skin Integrity: Goal: Risk for impaired skin integrity will decrease 04/22/2021 1006 by Ansel Bong, RN Outcome: Progressing 04/22/2021 1006 by Ansel Bong, RN Outcome: Progressing   Problem: Safety: Goal: Non-violent Restraint(s) 04/22/2021 1006 by Ansel Bong, RN Outcome: Progressing 04/22/2021 1006 by Ansel Bong, RN Outcome: Progressing

## 2021-04-22 NOTE — Progress Notes (Signed)
Neurology Progress Note  Patient ID: 50 yo man with new onset GTC in setting of admission for hypertensive urgency with severe headache and nausea after abrupt onset of severe headache during spell of vomiting last week. 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  Repeat MRI brain 7/2 showed interval development of T2/FLAIR sulcal hyper intensity in bilateral occipital lobes with no corresponding leptomeningeal enhancement c/f proteinaceous fluid vs artifact vs potential subarachnoid blood. Pt has been afebrile. Leukocytosis present on admission has resolved. MRA H&N and subsequent CTA H&N showed no e/o aneurysm or dissection. Patient is at mental status baseline and has no focal neurologic deficits.   Subjective: - Pt denies current neurologic deficits. Wife at bedside states he is back to mental status baseline. No headache currently. - However, patient reports recurrent emesis just prior to my arrival which he attributes to having been n.p.o. for TEE followed by taking all of his medications on an empty stomach - He reports he does want to eat, but needs to pace himself  - Eager to go home, though wife is concerned  - Blood pressures continue to be labile and he received a as needed dose of hydralazine IV early this morning for blood pressure of 196/102 - Patient denies any substance use including marijuana   Current Outpatient Medications  Medication Instructions   amLODipine (NORVASC) 10 mg, Oral, Daily   BESIVANCE 0.6 % SUSP 1 drop, 3 times daily   carvedilol (COREG) 25 mg, Oral, 2 times daily   carvedilol (COREG) 25 mg, Oral, 2 times daily   chlorthalidone (HYGROTON) 25 mg, Oral, Daily   Cholecalciferol 50 MCG (2000 UT) TABS Oral   DUREZOL 0.05 % EMUL 1 drop, 4 times daily   losartan (COZAAR) 100 mg, Oral, Daily   metFORMIN (GLUCOPHAGE-XR) 500 mg, Oral, 2 times daily   PROLENSA 0.07 % SOLN 1 drop, Daily at bedtime   tadalafil (CIALIS) 20 MG tablet Oral    Vitamin D (Ergocalciferol) (DRISDOL) 50,000 Units, Oral, Weekly     Current Facility-Administered Medications:    acetaminophen (TYLENOL) tablet 650 mg, 650 mg, Oral, Q6H PRN, Ronnald Ramp, RPH   amLODipine (NORVASC) tablet 10 mg, 10 mg, Oral, Daily, Chipper Herb, Dekui, MD, 10 mg at 04/22/21 1128   carvedilol (COREG) tablet 25 mg, 25 mg, Oral, BID WC, Lorretta Harp, MD, 25 mg at 04/22/21 1638   Chlorhexidine Gluconate Cloth 2 % PADS 6 each, 6 each, Topical, Daily, Lorretta Harp, MD, 6 each at 04/22/21 7591   cholecalciferol (VITAMIN D3) tablet 2,000 Units, 2,000 Units, Oral, Daily, Lorretta Harp, MD, 2,000 Units at 04/22/21 1127   cloNIDine (CATAPRES - Dosed in mg/24 hr) patch 0.3 mg, 0.3 mg, Transdermal, Weekly, Rust-Chester, Britton L, NP, 0.3 mg at 04/19/21 0101   haloperidol lactate (HALDOL) injection 5 mg, 5 mg, Intramuscular, Q6H PRN, Mansy, Jan A, MD   heparin injection 5,000 Units, 5,000 Units, Subcutaneous, Q8H, Niu, Brien Few, MD, 5,000 Units at 04/22/21 2102   hydrALAZINE (APRESOLINE) injection 10-20 mg, 10-20 mg, Intravenous, Q4H PRN, Rust-Chester, Britton L, NP, 20 mg at 04/22/21 0841   insulin aspart (novoLOG) injection 0-5 Units, 0-5 Units, Subcutaneous, QHS, Lorretta Harp, MD, 5 Units at 04/22/21 2103   insulin aspart (novoLOG) injection 0-9 Units, 0-9 Units, Subcutaneous, TID WC, Lorretta Harp, MD, 7 Units at 04/22/21 1635   insulin glargine (LANTUS) injection 15 Units, 15 Units, Subcutaneous, Daily, Marrion Coy, MD, 15 Units at 04/22/21 0841   levETIRAcetam (KEPPRA) IVPB 1000 mg/100 mL premix,  1,000 mg, Intravenous, Q12H, Rust-Chester, Micheline Rough L, NP, Last Rate: 400 mL/hr at 04/22/21 2102, 1,000 mg at 04/22/21 2102   LORazepam (ATIVAN) injection 1-2 mg, 1-2 mg, Intravenous, Q1H PRN, Mansy, Jan A, MD, 2 mg at 04/18/21 2159   losartan (COZAAR) tablet 100 mg, 100 mg, Oral, Daily, Lorretta Harp, MD, 100 mg at 04/22/21 1128   ondansetron (ZOFRAN) injection 4 mg, 4 mg, Intravenous, Q6H PRN, Marrion Coy, MD, 4  mg at 04/22/21 1215   oxyCODONE-acetaminophen (PERCOCET/ROXICET) 5-325 MG per tablet 1 tablet, 1 tablet, Oral, Q4H PRN, Marrion Coy, MD, 1 tablet at 04/18/21 1809   tamsulosin (FLOMAX) capsule 0.4 mg, 0.4 mg, Oral, Daily, Marrion Coy, MD, 0.4 mg at 04/22/21 1128   Exam: Vitals:   04/22/21 0807 04/22/21 0930  BP: (!) 196/102 (!) 149/85  Pulse: 70 78  Resp: 17 17  Temp: 98.2 F (36.8 C) 98 F (36.7 C)  SpO2: 98% 99%   Vital signs in last 24 hours: Temp:  [98 F (36.7 C)-98.2 F (36.8 C)] 98 F (36.7 C) (07/05 0930) Pulse Rate:  [62-78] 78 (07/05 0930) Resp:  [17] 17 (07/05 0930) BP: (149-196)/(85-102) 149/85 (07/05 0930) SpO2:  [98 %-99 %] 99 % (07/05 0930)   Physical Exam Gen: A&Ox4, NAD HEENT: Atraumatic, normocephalic; oropharynx clear, tongue without atrophy or fasciculations. Resp: CTAB, normal work of breathing CV: RRR, extremities appear well-perfused. Abd: soft/NT/ND Extrem: Nml bulk; no cyanosis, clubbing, or edema.  Neuro: *MS: A&O x4. Follows multi-step commands.  *Speech: no dysarthria or aphasia, able to name and repeat. *CN:    I: Deferred   II,III: PERRLA, VFF by confrontation,    III,IV,VI: EOMI w/o nystagmus, no ptosis although he does have saccadic pursuits   V: Sensation intact from V1 to V3 to LT   VII: Eyelid closure was full.  Smile symmetric.   VIII: Hearing intact to voice   IX,X: Voice normal, palate elevates symmetrically    XI: SCM/trap 5/5 bilat   XII: Tongue protrudes midline, no atrophy or fasciculations  *Motor:   Normal bulk.  No tremor, rigidity or bradykinesia. No pronator drift.  Full confrontational testing not performed today, per Dr. Selina Cooley on last examination:  Strength: Dlt Bic Tri WE WrF FgS Gr HF KnF KnE PlF DoF    Left 5 5 5 5 5 5 5 5 5 5 5 5     Right 5 5 5 5 5 5 5 5 5 5 5 5    *Sensory: Intact to light touch throughout. Symmetric.  No double-simultaneous extinction.  *Coordination:  Finger-to-nose, heel-to-shin, rapid  alternating motions were intact. *Reflexes:  2+ and symmetric throughout without clonus; toes down-going bilat *Gait: deferred  Labs reviewed, UDS obtained as an add-on to 7/2 sample and positive only for benzodiazepines received the night before AKI is resolving and leukocytosis has also resolved  Basic Metabolic Panel: Recent Labs  Lab 04/18/21 0440 04/19/21 0416 04/20/21 0355 04/21/21 0445 04/22/21 0514  NA 130* 133* 133* 134* 135  K 3.6 4.1 3.7 3.8 4.0  CL 97* 99 104 103 106  CO2 25 26 23 22 25   GLUCOSE 364* 218* 178* 191* 186*  BUN 35* 36* 33* 26* 19  CREATININE 2.56* 2.57* 2.11* 2.01* 1.78*  CALCIUM 8.5* 8.5* 8.2* 8.4* 8.3*  MG  --  2.3 2.2 2.2 2.2    CBC: Recent Labs  Lab 04/17/21 0940 04/18/21 0440 04/19/21 0416 04/20/21 0355 04/21/21 0445  WBC 17.3* 12.5* 12.3* 8.7 9.6  NEUTROABS  --   --  9.1* 5.9 7.3  HGB 16.1 14.9 13.8 12.6* 13.2  HCT 46.0 41.8 39.3 37.0* 38.1*  MCV 89.7 90.5 90.1 92.3 91.1  PLT 252 230 215 205 213    Coagulation Studies: No results for input(s): LABPROT, INR in the last 72 hours.   EEG: Description: The posterior dominant rhythm consists of 9 Hz activity of moderate voltage (25-35 uV) seen predominantly in posterior head regions, symmetric and reactive to eye opening and eye closing. Sleep was characterized by vertex waves, sleep spindles (12 to 14 Hz), maximal frontocentral region. EEG showed continuous right frontal 3-6hz  theta-delta slowing. There was also intermittent generalized 3 to 6 Hz theta-delta slowing. Physiologic photic driving was not seen during photic stimulation.  Hyperventilation was not performed.    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 There is also mild diffuse encephalopathy, nonspecific etiology. No seizures or epileptiform  discharges were seen throughout the recording.   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  Repeat MRI brain 7/2 showed interval development of T2/FLAIR sulcal hyper intensity in bilateral occipital lobes with no corresponding leptomeningeal enhancement c/f proteinaceous fluid vs artifact vs potential subarachnoid blood. Pt has been afebrile. Leukocytosis present on admission has resolved. MRA H&N and subsequent CTA H&N showed no e/o aneurysm or dissection. Patient is at mental status baseline and has no focal neurologic deficits, however he continues to have nausea/vomiting, labile blood pressures and poor p.o. intake today. TEE cancelled by primary team today.   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  No obvious etiology for RCVS/PRES on review of medications.  However the management of this condition requires strict blood pressure control and it should be confirmed that the patient is maintaining systolic blood pressures less than 160 without requiring IV as needed's and tolerating good p.o. intake prior to being safe for discharge.  Additionally discussed possibility of aneurysmal bleed with neuro radiology/neuro interventional radiologist, who agreed that MRI findings would be more consistent with subarachnoid hemorrhage secondary to PRES/RCVS and highly unlikely to be an aneurysmal bleed.  Given the length of time since initial symptoms and clinical course to date and do not feel that lumbar puncture would be high yield at this point, but low threshold to obtain should patient decline.  If patient continues to improve  neurologically and clinically with good blood pressure control do not think he needs more inpatient work-up, but if he declines further evaluation for other potential etiologies such as vasculitis would be warranted  Recommendations: - MRA H&N and subsequent CTA H&N showed no e/o aneurysm or dissection.  - Continue holding anti platelets given possible SAH on MRI brain; he has been tolerating DVT prophylaxis Heparin dose so okay to continue this - F/u ID consult, defer to them on antibiotic mgmt - Continue keppra 1000mg  bid, may consider weaning on an outpatient basis - EEG pending today - BP mgmt goal strict systolic <160,  - STAT head CT and repeat vessel imaging for any decline in neurologic exam including recurrent severe headache - No valve vegetations on  TTE, TEE recommended but canceled by primary team, who reported to me that they are not concerned about meningitis or endocarditis  Will continue to follow  Brooke DareSrishti Nithin Demeo MD-PhD [facility] [phone] [facility] coverage for [facility] is from 8 AM to 4 AM in-house and 4 PM to 8 PM by telephone/video. 8 PM to 8 AM emergent questions or overnight urgent questions should be addressed to [facility]rology On-call or [facility]; contact information can be found on AMION  Greater than 35 minutes were spent in direct care of this patient today, greater than 50% at bedside including discussions with Dr. Tommie Samsodrigues De Macedo, primary team, patient, wife at bedside

## 2021-04-22 NOTE — Progress Notes (Signed)
   Progress Note  Patient Name: Juvenal Umar Date of Encounter: 04/22/2021  Per Dr. Chipper Herb, positive blood cultures felt to be a contaminant by ID; TEE not indicated at this time.  We will sign off.  Please reconsult our service if patient's clinical picture changes and TEE is needed.  For questions or updates, please contact [facility] Please consult [email or web address] for contact info under [facility].     Signed, Yvonne Kendall, MD  04/22/2021, 11:20 AM

## 2021-04-22 NOTE — Progress Notes (Signed)
PROGRESS NOTE    Clayton [name]  DTO:[id number] DOB: [birth year] DOA: 04/17/2021 PCP: [facility]   Chief complaint.  Severe headache. Brief Narrative:  Clayton [name] is a 50 y.o. male with medical history significant of hypertension, diabetes mellitus, CKD stage IIIb, who presents with nausea, vomiting, headache, right-sided neck pain. Upon arriving the hospital, blood pressure was 247/115.  Patient was continued on home medicines, was also given labetalol IV push, then she was placed on nicardipine drip. MRI brain was performed, showed microhemorrhages, I discussed the case with Dr. Otelia Limes, he has reviewed the MRI images, currently there is no concern for future bleeding.  Okay to continue aspirin and a prophylactic heparin. Carotid MRA did not show any carotid dissection. 7/1.  Discontinued nicardipine, started on clonidine 0.2 mg twice a day.  Increase insulin dose. 7/2.  Patient developed myoclonic and seizure x2, seen by neurology, started Keppra.  MRI obtained.  Blood culture positive for gram-positive cocci. 7/4.  Blood culture is also identified as Staphylococcus capitis. 7/5.  Formal consult from ID obtained, deemed staph capitis is also contaminants.  Antibiotics will be discontinued.   Assessment & Plan:   Principal Problem:   Hypertensive emergency Active Problems:   Nausea & vomiting   Acute metabolic encephalopathy   CKD (chronic kidney disease), stage IIIb   Type II diabetes mellitus with renal manifestations (HCC)   Leukocytosis   Elevated lactic acid level   Elevated troponin   Seizure (HCC)   Bacteremia due to Gram-positive bacteria   Hyponatremia   Acute urinary retention   Hypertensive encephalopathy  #1.  Hypertensive emergency. Hypertensive encephalopathy.   Elevated troponin secondary to hypertensive emergency. Intracranial microhemorrhage. Patient blood pressure is relatively stable.  MRI showed peripheral microhemorrhage.   Patient also had altered mental status.  Patient condition has been improved with the multiple medicines.  I have increased her Norvasc to 10 mg today.  As his blood pressure running high.  He also vomited today, we will keep him another day, plan to discharge him tomorrow.  2.  Myoclonic seizure. Seen by neurology.  Currently on Keppra.  This is thought to be secondary to intracranial microhemorrhage. EEG pending.  #3.  Positive blood culture. Consult from [name] obtained, does not seem to have any infection.  Antibiotics continued, bacterial meningitis unlikely.  #4.  Acute kidney injury on chronic kidney disease stage IIIb. Hyponatremia. Based on serial lab results, patient initially had acute kidney injury due to hypertension emergency.  Creatinine much better.  #5.  Uncontrolled type 2 diabetes with hyperglycemia. Glucose is better.  6.  Neck pain. Patient had neck pain at time admission, neck MRI angiogram and a CT angiogram both negative for carotid dissection or aneurysm.  Neurology planning for 5 to 7 days follow-up as outpatient and repeat CT angiogram.    DVT prophylaxis: SCDs Code Status: full Family Communication: Wife updated. Disposition Plan:    Status is: Inpatient  Remains inpatient appropriate because:Inpatient level of care appropriate due to severity of illness  Dispo: The patient is from: Home              Anticipated d/c is to: Home              Patient currently is not medically stable to d/c.   Difficult to place patient No        I/O last 3 completed shifts: In: 1416.8 [P.O.:840; I.V.:576.8] Out: 2075 [Urine:2075] Total I/O In: 360 [P.O.:360] Out: 1100 [  Urine:1100]     Consultants:  Neurology, ID  Procedures: None  Antimicrobials: none  Subjective: Patient still has nausea and vomiting today.  But no abdominal pain.  He was able to keep food down. No short of breath or cough. Headache essentially resolved.  No dizziness   No dysuria hematuria. No fever or chills. No chest pain or palpitation  Objective: Vitals:   04/22/21 0807 04/22/21 0930 04/22/21 1133 04/22/21 1448  BP: (!) 196/102 (!) 149/85 (!) 172/87 (!) 177/80  Pulse: 70 78 71 76  Resp: Temp: 98.2 F (36.8 C) 98 F (36.7 C) 98.2 F (36.8 C) 97.9 F (36.6 C)  TempSrc: Oral Oral  Oral  SpO2: 98% 99% 97% 99%  Weight:      Height:        Intake/Output Summary (Last 24 hours) at 04/22/2021 1512 Last data filed at 04/22/2021 1451 Gross per 24 hour  Intake 600 ml  Output 2300 ml  Net -1700 ml   Filed Weights   04/17/21 0927 04/17/21 2300  Weight: 119.3 kg 119.3 kg    Examination:  General exam: Appears calm and comfortable  Respiratory system: Clear to auscultation. Respiratory effort normal. Cardiovascular system: S1 & S2 heard, RRR. No JVD, murmurs, rubs, gallops or clicks. No pedal edema. Gastrointestinal system: Abdomen is nondistended, soft and nontender. No organomegaly or masses felt. Normal bowel sounds heard. Central nervous system: Alert and oriented. No focal neurological deficits. Extremities: Symmetric 5 x 5 power. Skin: No rashes, lesions or ulcers Psychiatry: Judgement and insight appear normal. Mood & affect appropriate.     Data Reviewed: I have personally reviewed following labs and imaging studies  CBC: Recent Labs  Lab 04/17/21 0940 04/18/21 0440 04/19/21 0416 04/20/21 0355 04/21/21 0445  WBC 17.3* 12.5* 12.3* 8.7 9.6  NEUTROABS  --   --  9.1* 5.9 7.3  HGB 16.1 14.9 13.8 12.6* 13.2  HCT 46.0 41.8 39.3 37.0* 38.1*  MCV 89.7 90.5 90.1 92.3 91.1  PLT 252 230 215 205 213   Basic Metabolic Panel: Recent Labs  Lab 04/18/21 0440 04/19/21 0416 04/20/21 0355 04/21/21 0445 04/22/21 0514  NA 130* 133* 133* 134* 135  K 3.6 4.1 3.7 3.8 4.0  CL 97* 99 104 103 106  CO2 GLUCOSE 364* 218* 178* 191* 186*  BUN 35* 36* 33* 26* 19  CREATININE 2.56* 2.57* 2.11* 2.01* 1.78*  CALCIUM  8.5* 8.5* 8.2* 8.4* 8.3*  MG  --  2.3 2.2 2.2 2.2   GFR: Estimated Creatinine Clearance: 66.7 mL/min (A) (by C-G formula based on SCr of 1.78 mg/dL (H)). Liver Function Tests: No results for input(s): AST, ALT, ALKPHOS, BILITOT, PROT, ALBUMIN in the last 168 hours. Recent Labs  Lab 04/17/21 0940  LIPASE 27   No results for input(s): AMMONIA in the last 168 hours. Coagulation Profile: Recent Labs  Lab 04/17/21 0940  INR 1.0   Cardiac Enzymes: No results for input(s): CKTOTAL, CKMB, CKMBINDEX, TROPONINI in the last 168 hours. BNP (last 3 results) No results for input(s): PROBNP in the last 8760 hours. HbA1C: No results for input(s): HGBA1C in the last 72 hours. CBG: Recent Labs  Lab 04/21/21 1224 04/21/21 1651 04/21/21 2101 04/22/21 0808 04/22/21 1134  GLUCAP 188* 255* 166* 188* 197*   Lipid Profile: No results for input(s): CHOL, HDL, LDLCALC, TRIG, CHOLHDL, LDLDIRECT in the last 72 hours. Thyroid Function Tests: No results for input(s): TSH, T4TOTAL, FREET4, T3FREE,  THYROIDAB in the last 72 hours. Anemia Panel: No results for input(s): VITAMINB12, FOLATE, FERRITIN, TIBC, IRON, RETICCTPCT in the last 72 hours. Sepsis Labs: Recent Labs  Lab 04/17/21 0940 04/17/21 1218 04/17/21 1522 04/17/21 1638 04/17/21 1945 04/17/21 2215  PROCALCITON <0.10  --   --   --   --   --   LATICACIDVEN  --    < > 3.0* 2.9* 3.7* 2.5*   < > = values in this interval not displayed.    Recent Results (from the past 240 hour(s))  Resp Panel by RT-PCR (Flu A&B, Covid) Nasopharyngeal Swab     Status: None   Collection Time: 04/17/21  9:40 AM   Specimen: Nasopharyngeal Swab; Nasopharyngeal(NP) swabs in vial transport medium  Result Value Ref Range Status   SARS Coronavirus 2 by RT PCR NEGATIVE NEGATIVE Final    Comment: (NOTE) SARS-CoV-2 target nucleic acids are NOT DETECTED.  The SARS-CoV-2 RNA is generally detectable in upper respiratory specimens during the acute phase of  infection. The lowest concentration of SARS-CoV-2 viral copies this assay can detect is 138 copies/mL. A negative result does not preclude SARS-Cov-2 infection and should not be used as the sole basis for treatment or other patient management decisions. A negative result may occur with  improper specimen collection/handling, submission of specimen other than nasopharyngeal swab, presence of viral mutation(s) within the areas targeted by this assay, and inadequate number of viral copies(<138 copies/mL). A negative result must be combined with clinical observations, patient history, and epidemiological information. The expected result is Negative.  Fact Sheet for Patients:  BloggerCourse.com  Fact Sheet for Healthcare Providers:  SeriousBroker.it  This test is no t yet approved or cleared by the [place] FDA and  has been authorized for detection and/or diagnosis of SARS-CoV-2 by FDA under an Emergency Use Authorization (EUA). This EUA will remain  in effect (meaning this test can be used) for the duration of the COVID-19 declaration under Section 564(b)(1) of the Act, 21 U.S.C.section 360bbb-3(b)(1), unless the authorization is terminated  or revoked sooner.       Influenza A by PCR NEGATIVE NEGATIVE Final   Influenza B by PCR NEGATIVE NEGATIVE Final    Comment: (NOTE) The Xpert Xpress SARS-CoV-2/FLU/RSV plus assay is intended as an aid in the diagnosis of influenza from Nasopharyngeal swab specimens and should not be used as a sole basis for treatment. Nasal washings and aspirates are unacceptable for Xpert Xpress SARS-CoV-2/FLU/RSV testing.  Fact Sheet for Patients: BloggerCourse.com  Fact Sheet for Healthcare Providers: SeriousBroker.it  This test is not yet approved or cleared by the [place] FDA and has been authorized for detection and/or diagnosis of SARS-CoV-2  by FDA under an Emergency Use Authorization (EUA). This EUA will remain in effect (meaning this test can be used) for the duration of the COVID-19 declaration under Section 564(b)(1) of the Act, 21 U.S.C. section 360bbb-3(b)(1), unless the authorization is terminated or revoked.  Performed at [facility], [address] [place], [place] [address]   CULTURE, BLOOD (ROUTINE X 2) w Reflex to ID Panel     Status: Abnormal (Preliminary result)   Collection Time: 04/17/21  4:34 PM   Specimen: BLOOD  Result Value Ref Range Status   Specimen Description   Final    BLOOD RIGHT ANTECUBITAL Performed at [facility], [address] [place], [place] [address]    Special Requests   Final    BOTTLES DRAWN AEROBIC AND ANAEROBIC Blood Culture results  may not be optimal due to an inadequate volume of blood received in culture bottles Performed at [facility], [address] [place], [place] [address]    Culture  Setup Time   Final    GRAM POSITIVE COCCI ANAEROBIC BOTTLE ONLY CRITICAL VALUE NOTED.  VALUE IS CONSISTENT WITH PREVIOUSLY REPORTED AND CALLED VALUE. Performed at [facility], [address] [place], [place] [address]    Culture STAPHYLOCOCCUS CAPITIS (A)  Final   Report Status PENDING  Incomplete  CULTURE, BLOOD (ROUTINE X 2) w Reflex to ID Panel     Status: Abnormal (Preliminary result)   Collection Time: 04/17/21  4:38 PM   Specimen: BLOOD  Result Value Ref Range Status   Specimen Description   Final    BLOOD LEFT ANTECUBITAL Performed at [facility], [address], [place], [place] [address]    Special Requests   Final    BOTTLES DRAWN AEROBIC AND ANAEROBIC Blood Culture results may not be optimal due to an inadequate volume of blood received in culture bottles Performed at [facility], [address], [place], [place] [address]    Culture  Setup Time   Final    GRAM POSITIVE COCCI IN BOTH AEROBIC AND  ANAEROBIC BOTTLES CRITICAL RESULT CALLED TO, READ BACK BY AND VERIFIED WITH: SUSAN WATSON  ON 07.01.22.SH    Culture (A)  Final    STAPHYLOCOCCUS CAPITIS Sent to [facility] for further susceptibility testing. STAPHYLOCOCCUS EPIDERMIDIS THE SIGNIFICANCE OF ISOLATING THIS ORGANISM FROM A SINGLE SET OF BLOOD CULTURES WHEN MULTIPLE SETS ARE DRAWN IS UNCERTAIN. PLEASE NOTIFY THE MICROBIOLOGY DEPARTMENT WITHIN ONE WEEK IF SPECIATION AND SENSITIVITIES ARE REQUIRED. Performed at [facility], [address], [place], [place] [address]    Report Status PENDING  Incomplete  Blood Culture ID Panel (Reflexed)     Status: Abnormal   Collection Time: 04/17/21  4:38 PM  Result Value Ref Range Status   Enterococcus faecalis NOT DETECTED NOT DETECTED Final   Enterococcus Faecium NOT DETECTED NOT DETECTED Final   Listeria monocytogenes NOT DETECTED NOT DETECTED Final   Staphylococcus species DETECTED (A) NOT DETECTED Final    Comment: CRITICAL RESULT CALLED TO, READ BACK BY AND VERIFIED WITH: SUSAN WATSON  ON 07.01.22.SH    Staphylococcus aureus (BCID) NOT DETECTED NOT DETECTED Final   Staphylococcus epidermidis DETECTED (A) NOT DETECTED Final    Comment: CRITICAL RESULT CALLED TO, READ BACK BY AND VERIFIED WITH: SUSAN WATSON  ON 07.01.22.SH    Staphylococcus lugdunensis NOT DETECTED NOT DETECTED Final   Streptococcus species NOT DETECTED NOT DETECTED Final   Streptococcus agalactiae NOT DETECTED NOT DETECTED Final   Streptococcus pneumoniae NOT DETECTED NOT DETECTED Final   Streptococcus pyogenes NOT DETECTED NOT DETECTED Final   A.calcoaceticus-baumannii NOT DETECTED NOT DETECTED Final   Bacteroides fragilis NOT DETECTED NOT DETECTED Final   Enterobacterales NOT DETECTED NOT DETECTED Final   Enterobacter cloacae complex NOT DETECTED NOT DETECTED Final   Escherichia coli NOT DETECTED NOT DETECTED Final   Klebsiella aerogenes NOT DETECTED NOT DETECTED Final   Klebsiella oxytoca  NOT DETECTED NOT DETECTED Final   Klebsiella pneumoniae NOT DETECTED NOT DETECTED Final   Proteus species NOT DETECTED NOT DETECTED Final   Salmonella species NOT DETECTED NOT DETECTED Final   Serratia marcescens NOT DETECTED NOT DETECTED Final   Haemophilus influenzae NOT DETECTED NOT DETECTED Final   Neisseria meningitidis NOT DETECTED NOT DETECTED Final   Pseudomonas aeruginosa NOT DETECTED NOT DETECTED  Final   Stenotrophomonas maltophilia NOT DETECTED NOT DETECTED Final   Candida albicans NOT DETECTED NOT DETECTED Final   Candida auris NOT DETECTED NOT DETECTED Final   Candida glabrata NOT DETECTED NOT DETECTED Final   Candida krusei NOT DETECTED NOT DETECTED Final   Candida parapsilosis NOT DETECTED NOT DETECTED Final   Candida tropicalis NOT DETECTED NOT DETECTED Final   Cryptococcus neoformans/gattii NOT DETECTED NOT DETECTED Final   Methicillin resistance mecA/C NOT DETECTED NOT DETECTED Final    Comment: Performed at [facility], [address] [place], [place] [address]  MRSA Next Gen by PCR, Nasal     Status: None   Collection Time: 04/18/21  1:37 AM   Specimen: Nasal Mucosa; Nasal Swab  Result Value Ref Range Status   MRSA by PCR Next Gen NOT DETECTED NOT DETECTED Final    Comment: (NOTE) The GeneXpert MRSA Assay (FDA approved for NASAL specimens only), is one component of a comprehensive MRSA colonization surveillance program. It is not intended to diagnose MRSA infection nor to guide or monitor treatment for MRSA infections. Test performance is not FDA approved in patients less than 708 years old. Performed at [facility], [address] [place], [place] [address]   Urine Culture     Status: None   Collection Time: 04/19/21 11:00 AM   Specimen: Urine, Random  Result Value Ref Range Status   Specimen Description   Final    URINE, RANDOM Performed at [facility], [address] [place], [place] [address]    Special Requests    Final    NONE Performed at [facility], [address] [place], [place] [address]    Culture   Final    NO GROWTH Performed at [facility], [address] [place], [place] [address]    Report Status 04/20/2021 FINAL  Final  CULTURE, BLOOD (ROUTINE X 2) w Reflex to ID Panel     Status: None (Preliminary result)   Collection Time: 04/20/21 12:01 AM   Specimen: BLOOD  Result Value Ref Range Status   Specimen Description BLOOD BLOOD RIGHT FOREARM  Final   Special Requests   Final    BOTTLES DRAWN AEROBIC AND ANAEROBIC Blood Culture adequate volume   Culture   Final    NO GROWTH 2 DAYS Performed at [facility], [address] [place], [place] [address]    Report Status PENDING  Incomplete  CULTURE, BLOOD (ROUTINE X 2) w Reflex to ID Panel     Status: None (Preliminary result)   Collection Time: 04/20/21 12:02 AM   Specimen: BLOOD  Result Value Ref Range Status   Specimen Description BLOOD BLOOD RIGHT HAND  Final   Special Requests   Final    BOTTLES DRAWN AEROBIC AND ANAEROBIC Blood Culture adequate volume   Culture   Final    NO GROWTH 2 DAYS Performed at [facility], [address] [place], [place] [address]    Report Status PENDING  Incomplete         Radiology Studies: CT ANGIO HEAD NECK W WO CM  Result Date: 04/21/2021 CLINICAL DATA:  50 year old male with seizure and left side weakness. Hypertensive. Positive blood cultures for gram positive cocci. Abnormal bilateral cerebral sulci or leptomeninges on MRI 04/19/2021. Chronic microhemorrhages. EXAM: CT ANGIOGRAPHY HEAD AND NECK TECHNIQUE: Multidetector CT imaging of the head and neck was performed using the standard protocol during bolus administration of intravenous contrast. Multiplanar CT image  reconstructions and MIPs were obtained to evaluate the vascular anatomy. Carotid stenosis measurements (when applicable) are obtained utilizing NASCET criteria, using the distal internal  carotid diameter as the denominator. CONTRAST:  71mL OMNIPAQUE IOHEXOL 350 MG/ML SOLN COMPARISON:  Brain MRI 04/19/2021.  Head CT 04/18/2021 and earlier. FINDINGS: CT HEAD Brain: Cerebral volume is stable and within normal limits. No midline shift, ventriculomegaly, mass effect, evidence of mass lesion, intracranial hemorrhage or evidence of cortically based acute infarction. Gray-white matter differentiation is within normal limits throughout the brain. No hyperdense hemorrhage or CT correlation to the abnormal sulcal appearance on MR FLAIR imaging. Calvarium and skull base: No acute osseous abnormality identified. Paranasal sinuses: Visualized paranasal sinuses and mastoids are clear. Orbits: No acute orbit or scalp soft tissue finding. CTA NECK Skeleton: No acute osseous abnormality identified. Advanced chronic C5-C6 disc and endplate degeneration. Upper chest: Negative lung apices and superior mediastinum. Other neck: Negative. Aortic arch: 3 vessel arch configuration.  No arch atherosclerosis. Right carotid system: Negative brachiocephalic artery. Tortuous proximal right CCA. Minimal calcified plaque at the right ICA bulb. Mildly tortuous distal right ICA without stenosis. Left carotid system: Negative aside from mild tortuosity and mild left ICA bulb calcified plaque similar to the right side. Vertebral arteries: Normal proximal right subclavian artery and right vertebral artery origin. Right V1 segment obscured by paravertebral venous contrast. But otherwise the cervical right vertebral artery appears patent and within normal limits to the skull base. Normal proximal left subclavian artery and left vertebral artery origin. Mildly tortuous left vertebral is patent without plaque or stenosis to the skull base. CTA HEAD Suboptimal intracranial arterial contrast bolus. Posterior circulation: Fairly codominant distal vertebral arteries are patent to the basilar without plaque or stenosis. Both a ICAs appear  dominant. Patent basilar artery without stenosis. Normal SCA and PCA origins. Both posterior communicating arteries are present. Bilateral PCA branches are within normal limits. Anterior circulation: Both ICA siphons are patent. No siphon plaque or stenosis. Normal ophthalmic and posterior communicating artery origins. Patent carotid termini, MCA and ACA origins. Anterior communicating artery and bilateral ACA branches are within normal limits allowing for the contrast bolus. Left MCA M1 segment and bifurcation are patent. Right MCA M1 segment and bifurcation are patent. Bilateral MCA branches are within normal limits. Venous sinuses: Patent, with venous predominant intracranial contrast timing. Anatomic variants: None. Review of the MIP images confirms the above findings IMPRESSION: 1. Stable and unremarkable CT appearance of the brain - raising the likelihood of meningitis as etiology of the widespread subarachnoid abnormality on MRI 2 days ago. 2. Minor atherosclerosis at both ICA origins but otherwise negative CTA Head and Neck, no arterial stenosis identified. 3. 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 Electronically Signed   By: Odessa Fleming M.D.   On: 04/21/2021 08:47        Scheduled Meds:  amLODipine  10 mg Oral Daily   carvedilol  25 mg Oral BID WC   Chlorhexidine Gluconate Cloth  6 each Topical Daily   cholecalciferol  2,000 Units Oral Daily   cloNIDine  0.3 mg Transdermal Weekly   heparin  5,000 Units Subcutaneous Q8H   insulin aspart  0-5 Units Subcutaneous QHS   insulin aspart  0-9 Units Subcutaneous TID WC   insulin glargine  15 Units Subcutaneous Daily   losartan  100 mg Oral Daily   tamsulosin  0.4 mg Oral Daily   Continuous Infusions:  levETIRAcetam Stopped (04/22/21 0917)     LOS: 5 days  Time spent: 32 minutes, more than 50% time involved in direct patient care.    Marrion Coy, MD [facility]   To contact the attending provider between 7A-7P  or the covering provider during after hours 7P-7A, please log into the web site [email or web address] and access using universal [place] password for that web site. If you do not have the password, please call the hospital operator.  04/22/2021, 3:12 PM

## 2021-04-22 NOTE — Procedures (Addendum)
Patient Name: Clayton [name]  MRN: [id number]  Epilepsy Attending: Charlsie Quest  Referring Physician/Provider: Dr Brooke Dare Date: 04/22/2021 Duration: 30 mins  Patient history: 50 yo man with new onset GTC in setting of admission for hypertensive urgency with severe headache and nausea after abrupt onset of severe headache during spell of vomiting last week. EEG to evaluate for seizure.  Level of alertness: Awake, asleep  AEDs during EEG study: LEV  Technical aspects: This EEG study was done with scalp electrodes positioned according to the 10-20 International system of electrode placement. Electrical activity was acquired at a sampling rate of 500Hz  and reviewed with a high frequency filter of 70Hz  and a low frequency filter of 1Hz . EEG data were recorded continuously and digitally stored.   Description: The posterior dominant rhythm consists of 9 Hz activity of moderate voltage (25-35 uV) seen predominantly in posterior head regions, symmetric and reactive to eye opening and eye closing. Sleep was characterized by vertex waves, sleep spindles (12 to 14 Hz), maximal frontocentral region. EEG showed continuous right frontal 3-6hz  theta-delta slowing. There was also intermittent generalized 3 to 6 Hz theta-delta slowing. Physiologic photic driving was not seen during photic stimulation.  Hyperventilation was not performed.     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 There is also mild diffuse encephalopathy, nonspecific etiology. No seizures or epileptiform discharges were seen throughout the recording.  Arlyss Weathersby 

## 2021-04-22 NOTE — Consult Note (Addendum)
NAME: Clayton [name]  DOB: 01/18/1971  MRN: [id number]  Date/Time: 04/22/2021 10:06 AM  REQUESTING PROVIDER: Dr. Chipper Herb Subjective:  REASON FOR CONSULT: Bacteremia ? Clayton [name] is a 50 y.o. male with a history of hypertension, diabetes mellitus  presented to the ED on 04/17/2021 with headache, dizziness and neck pain and vomiting for the past 2 days. 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 The next day he did not feel well and slept at home on Wednesday he was having nausea and headaches.  He came to the ED Closely in the morning. In the ED vitals were BP of 220/125, pulse of 73, temperature 98.8, sats 98%. W he was given 2 doses of IV labetalol. Still 115 a Cardene drip was started for the patient.  As lethargic but easily arousable and oriented x3 on admission.  Hypertensive as above he was questioned.  Patient had MRI of the brain and MRA of the neck and head.  The first MRI done on 04/17/2021 showed no acute infarct but there was multiple chronic microhemorrhages in a predominantly peripheral distribution.  There was also multifocal hyperintense T2 weighted signal within the white matter.  The anterior and posterior circulation was normal.  On admission his WBC was 17.3 by the next day it came down to 12.5.  Blood cultures were sent on admission.  Blood culture 1 out of 4 bottle was positive for staph epidermidis and it was thought to be a contaminant.  On 04/18/2021 patient was noted to have tonic-clonic seizures at nighttime.  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  He was treated with IV Ativan and started on IV Keppra.  She was assessed by telemetry neurology..  The next day he was seen by neurologist on 04/19/2021. A repeat MRI done that day did not show much difference from the first one.  The blood cultures finalized as staph S and strep epidermidis.  I am asked to see the patient for the same and to rule out endocarditis.. On further questioning  the patient patient he has not been taking antihypertensive medicines for more than a month.  He also stopped taking diabetes medication.  He said he was feeling well and hence he stopped it.  Patient has had a similar episode 7 years ago in [place] when he was admitted to [facility] with nausea vomiting headache and altered mental status.  Was also found to have very high blood pressure and also diagnosed with diabetes at that admission.  He was told to take his medications regularly.  Patient denies any fever before admission. He had a cough for a week.  He had taken Alka-Seltzer for the cough.  He also had taken 1 tablet of ibuprofen. No travel, no sick contacts, lives with wife and child. No hardware or device in the body. No recent antibiotic use  Past Medical History:  Diagnosis Date   CKD (chronic kidney disease), stage III (HCC)    Hypertension    Type II diabetes mellitus with renal manifestations (HCC)     Past Surgical History:  Procedure Laterality Date   CATARACT EXTRACTION      Social History   Socioeconomic History   Marital status: Married    Spouse name: Not on file   Number of children: Not on file   Years of education: Not on file   Highest education level: Not on file  Occupational History   Not on file  Tobacco Use  Smoking status: Never   Smokeless tobacco: Never  Substance and Sexual Activity   Alcohol use: Yes   Drug use: Not Currently   Sexual activity: Yes  Other Topics Concern   Not on file  Social History Narrative   Not on file   Social Determinants of Health   Financial Resource Strain: Not on file  Food Insecurity: Not on file  Transportation Needs: Not on file  Physical Activity: Not on file  Stress: Not on file  Social Connections: Not on file  Intimate Partner Violence: Not on file    Family History  Problem Relation Age of Onset   Stroke Mother    Hypertension Mother    Hypertension Father    No Known  Allergies I? Current Facility-Administered Medications  Medication Dose Route Frequency Provider Last Rate Last Admin   acetaminophen (TYLENOL) tablet 650 mg  650 mg Oral Q6H PRN Marrion Coy, MD   650 mg at 04/21/21 0908   amLODipine (NORVASC) tablet 10 mg  10 mg Oral Daily Marrion Coy, MD       carvedilol (COREG) tablet 25 mg  25 mg Oral BID WC Lorretta Harp, MD   25 mg at 04/21/21 1715   cefTRIAXone (ROCEPHIN) 2 g in sodium chloride 0.9 % 100 mL IVPB  2 g Intravenous Q12H Marrion Coy, MD 200 mL/hr at 04/21/21 2139 2 g at 04/21/21 2139   Chlorhexidine Gluconate Cloth 2 % PADS 6 each  6 each Topical Daily Lorretta Harp, MD   6 each at 04/22/21 1324   cholecalciferol (VITAMIN D3) tablet 2,000 Units  2,000 Units Oral Daily Lorretta Harp, MD   2,000 Units at 04/21/21 0830   cloNIDine (CATAPRES - Dosed in mg/24 hr) patch 0.3 mg  0.3 mg Transdermal Weekly Rust-Chester, Britton L, NP   0.3 mg at 04/19/21 0101   haloperidol lactate (HALDOL) injection 5 mg  5 mg Intramuscular Q6H PRN Mansy, Jan A, MD       heparin injection 5,000 Units  5,000 Units Subcutaneous Q8H Lorretta Harp, MD   5,000 Units at 04/22/21 0520   hydrALAZINE (APRESOLINE) injection 10-20 mg  10-20 mg Intravenous Q4H PRN Rust-Chester, Britton L, NP   20 mg at 04/22/21 0841   insulin aspart (novoLOG) injection 0-5 Units  0-5 Units Subcutaneous QHS Lorretta Harp, MD   3 Units at 04/18/21 2359   insulin aspart (novoLOG) injection 0-9 Units  0-9 Units Subcutaneous TID WC Lorretta Harp, MD   2 Units at 04/22/21 0841   insulin glargine (LANTUS) injection 15 Units  15 Units Subcutaneous Daily Marrion Coy, MD   15 Units at 04/22/21 0841   levETIRAcetam (KEPPRA) IVPB 1000 mg/100 mL premix  1,000 mg Intravenous Q12H Rust-Chester, Cecelia Byars, NP   Stopped at 04/22/21 0917   LORazepam (ATIVAN) injection 1-2 mg  1-2 mg Intravenous Q1H PRN Mansy, Jan A, MD   2 mg at 04/18/21 2159   losartan (COZAAR) tablet 100 mg  100 mg Oral Daily Lorretta Harp, MD   100 mg at 04/21/21  0830   ondansetron (ZOFRAN) injection 4 mg  4 mg Intravenous Q6H PRN Marrion Coy, MD   4 mg at 04/21/21 1314   oxyCODONE-acetaminophen (PERCOCET/ROXICET) 5-325 MG per tablet 1 tablet  1 tablet Oral Q4H PRN Marrion Coy, MD   1 tablet at 04/18/21 1809   tamsulosin (FLOMAX) capsule 0.4 mg  0.4 mg Oral Daily Marrion Coy, MD   0.4 mg at 04/21/21 0831     Abtx:  Anti-infectives (From admission, onward)    Start     Dose/Rate Route Frequency Ordered Stop   04/19/21 2200  cefTRIAXone (ROCEPHIN) 2 g in sodium chloride 0.9 % 100 mL IVPB        2 g 200 mL/hr over 30 Minutes Intravenous Every 12 hours 04/19/21 1605     04/19/21 1000  ceFAZolin (ANCEF) IVPB 2g/100 mL premix  Status:  Discontinued        2 g 200 mL/hr over 30 Minutes Intravenous Every 8 hours 04/19/21 0855 04/19/21 1604   04/17/21 1700  vancomycin (VANCOREADY) IVPB 1500 mg/300 mL       See Hyperspace for full Linked Orders Report.   1,500 mg 150 mL/hr over 120 Minutes Intravenous  Once 04/17/21 1551 04/17/21 2318   04/17/21 1600  vancomycin (VANCOCIN) IVPB 1000 mg/200 mL premix       See Hyperspace for full Linked Orders Report.   1,000 mg 200 mL/hr over 60 Minutes Intravenous  Once 04/17/21 1551 04/17/21 1943   04/17/21 1545  ceFEPIme (MAXIPIME) 2 g in sodium chloride 0.9 % 100 mL IVPB        2 g 200 mL/hr over 30 Minutes Intravenous  Once 04/17/21 1537 04/17/21 1921   04/17/21 1545  metroNIDAZOLE (FLAGYL) IVPB 500 mg        500 mg 100 mL/hr over 60 Minutes Intravenous  Once 04/17/21 1537 04/17/21 1805   04/17/21 1545  vancomycin (VANCOCIN) IVPB 1000 mg/200 mL premix  Status:  Discontinued        1,000 mg 200 mL/hr over 60 Minutes Intravenous  Once 04/17/21 1537 04/17/21 1551       REVIEW OF SYSTEMS:  Const: felt hot , cold and clammy on Tuesday/wed Eyes: negative diplopia or visual changes, negative eye pain ENT: negative coryza, negative sore throat Resp:  cough, no hemoptysis, dyspnea Cards: negative for chest  pain, palpitations, lower extremity edema GU: negative for frequency, dysuria and hematuria GI: Negative for abdominal pain, diarrhea, bleeding, constipation Skin: negative for rash and pruritus Heme: negative for easy bruising and gum/nose bleeding MS: negative for myalgias, arthralgias, back pain and muscle weakness Neurolo:dizziness, headache- rt side and neck Psych: negative for feelings of anxiety, depression  Endocrine: , diabetes Allergy/Immunology- negative for any medication or food allergies ?  Objective:  VITALS:  BP (!) 149/85 (BP Location: Right Arm)   Pulse 78   Temp 98 F (36.7 C) (Oral)   Resp 17   Ht 5\' 11"  (1.803 m)   Wt 119.3 kg   SpO2 99%   BMI 36.68 kg/m  PHYSICAL EXAM:  General: Alert, cooperative, no distress, appears stated age.  Head: Normocephalic, without obvious abnormality, atraumatic. Eyes: Conjunctivae clear, anicteric sclerae. Pupils are small 41mm- reacting ENT Nares normal. No drainage or sinus tenderness. Lips, mucosa, and tongue normal. No Thrush Neck: Supple, symmetrical, no adenopathy, thyroid: non tender no carotid bruit and no JVD. Back: No CVA tenderness. Lungs: Clear to auscultation bilaterally. No Wheezing or Rhonchi. No rales. Heart: Regular rate and rhythm, no murmur, rub or gallop. Abdomen: Soft, non-tender,not distended. Bowel sounds normal. No masses Extremities: atraumatic, no cyanosis. No edema. No clubbing Skin: No rashes or lesions. Or bruising Lymph: Cervical, supraclavicular normal. Neurologic: Grossly non-focal Pertinent Labs Lab Results CBC    Component Value Date/Time   WBC 9.6 04/21/2021 0445   RBC 4.18 (L) 04/21/2021 0445   HGB 13.2 04/21/2021 0445   HCT 38.1 (L) 04/21/2021 0445   PLT 213 04/21/2021  0445   MCV 91.1 04/21/2021 0445   MCH 31.6 04/21/2021 0445   MCHC 34.6 04/21/2021 0445   RDW 11.9 04/21/2021 0445   LYMPHSABS 1.3 04/21/2021 0445   MONOABS 0.7 04/21/2021 0445   EOSABS 0.2 04/21/2021 0445    BASOSABS 0.0 04/21/2021 0445    CMP Latest Ref Rng & Units 04/22/2021 04/21/2021 04/20/2021  Glucose 70 - 99 mg/dL 960(A) 540(J) 811(B)  BUN 6 - 20 mg/dL 19 14(N) 82(N)  Creatinine 0.61 - 1.24 mg/dL 5.62(Z) 3.08(M) 5.78(I)  Sodium 135 - 145 mmol/L 135 134(L) 133(L)  Potassium 3.5 - 5.1 mmol/L 4.0 3.8 3.7  Chloride 98 - 111 mmol/L 106 103 104  CO2 22 - 32 mmol/L Calcium 8.9 - 10.3 mg/dL 8.3(L) 8.4(L) 8.2(L)      Microbiology: Recent Results (from the past 240 hour(s))  Resp Panel by RT-PCR (Flu A&B, Covid) Nasopharyngeal Swab     Status: None   Collection Time: 04/17/21  9:40 AM   Specimen: Nasopharyngeal Swab; Nasopharyngeal(NP) swabs in vial transport medium  Result Value Ref Range Status   SARS Coronavirus 2 by RT PCR NEGATIVE NEGATIVE Final    Comment: (NOTE) SARS-CoV-2 target nucleic acids are NOT DETECTED.  The SARS-CoV-2 RNA is generally detectable in upper respiratory specimens during the acute phase of infection. The lowest concentration of SARS-CoV-2 viral copies this assay can detect is 138 copies/mL. A negative result does not preclude SARS-Cov-2 infection and should not be used as the sole basis for treatment or other patient management decisions. A negative result may occur with  improper specimen collection/handling, submission of specimen other than nasopharyngeal swab, presence of viral mutation(s) within the areas targeted by this assay, and inadequate number of viral copies(<138 copies/mL). A negative result must be combined with clinical observations, patient history, and epidemiological information. The expected result is Negative.  Fact Sheet for Patients:  BloggerCourse.com  Fact Sheet for Healthcare Providers:  SeriousBroker.it  This test is no t yet approved or cleared by the [place] FDA and  has been authorized for detection and/or diagnosis of SARS-CoV-2 by FDA under an Emergency Use  Authorization (EUA). This EUA will remain  in effect (meaning this test can be used) for the duration of the COVID-19 declaration under Section 564(b)(1) of the Act, 21 U.S.C.section 360bbb-3(b)(1), unless the authorization is terminated  or revoked sooner.       Influenza A by PCR NEGATIVE NEGATIVE Final   Influenza B by PCR NEGATIVE NEGATIVE Final    Comment: (NOTE) The Xpert Xpress SARS-CoV-2/FLU/RSV plus assay is intended as an aid in the diagnosis of influenza from Nasopharyngeal swab specimens and should not be used as a sole basis for treatment. Nasal washings and aspirates are unacceptable for Xpert Xpress SARS-CoV-2/FLU/RSV testing.  Fact Sheet for Patients: BloggerCourse.com  Fact Sheet for Healthcare Providers: SeriousBroker.it  This test is not yet approved or cleared by the [place] FDA and has been authorized for detection and/or diagnosis of SARS-CoV-2 by FDA under an Emergency Use Authorization (EUA). This EUA will remain in effect (meaning this test can be used) for the duration of the COVID-19 declaration under Section 564(b)(1) of the Act, 21 U.S.C. section 360bbb-3(b)(1), unless the authorization is terminated or revoked.  Performed at [facility], [address] [place], [place] [address]   CULTURE, BLOOD (ROUTINE X 2) w Reflex to ID Panel     Status: Abnormal (Preliminary result)   Collection Time: 04/17/21  4:34 PM  Specimen: BLOOD  Result Value Ref Range Status   Specimen Description   Final    BLOOD RIGHT ANTECUBITAL Performed at [facility], [address] [place], [place] [address]    Special Requests   Final    BOTTLES DRAWN AEROBIC AND ANAEROBIC Blood Culture results may not be optimal due to an inadequate volume of blood received in culture bottles Performed at [facility], [address] [place], [place] [address]    Culture  Setup Time   Final     GRAM POSITIVE COCCI ANAEROBIC BOTTLE ONLY CRITICAL VALUE NOTED.  VALUE IS CONSISTENT WITH PREVIOUSLY REPORTED AND CALLED VALUE. Performed at [facility], [address] [place], [place] [address]    Culture STAPHYLOCOCCUS CAPITIS (A)  Final   Report Status PENDING  Incomplete  CULTURE, BLOOD (ROUTINE X 2) w Reflex to ID Panel     Status: Abnormal (Preliminary result)   Collection Time: 04/17/21  4:38 PM   Specimen: BLOOD  Result Value Ref Range Status   Specimen Description   Final    BLOOD LEFT ANTECUBITAL Performed at [facility], [address] [place], [place] [address]    Special Requests   Final    BOTTLES DRAWN AEROBIC AND ANAEROBIC Blood Culture results may not be optimal due to an inadequate volume of blood received in culture bottles Performed at [facility], [address] [place], [place] [address]    Culture  Setup Time   Final    GRAM POSITIVE COCCI IN BOTH AEROBIC AND ANAEROBIC BOTTLES CRITICAL RESULT CALLED TO, READ BACK BY AND VERIFIED WITH: SUSAN WATSON @1518  ON 07.01.22.SH    Culture (A)  Final    STAPHYLOCOCCUS CAPITIS Sent to [facility] for further susceptibility testing. STAPHYLOCOCCUS EPIDERMIDIS THE SIGNIFICANCE OF ISOLATING THIS ORGANISM FROM A SINGLE SET OF BLOOD CULTURES WHEN MULTIPLE SETS ARE DRAWN IS UNCERTAIN. PLEASE NOTIFY THE MICROBIOLOGY DEPARTMENT WITHIN ONE WEEK IF SPECIATION AND SENSITIVITIES ARE REQUIRED. Performed at [facility], [address] [place], [place] [address]    Report Status PENDING  Incomplete  Blood Culture ID Panel (Reflexed)     Status: Abnormal   Collection Time: 04/17/21  4:38 PM  Result Value Ref Range Status   Enterococcus faecalis NOT DETECTED NOT DETECTED Final   Enterococcus Faecium NOT DETECTED NOT DETECTED Final   Listeria monocytogenes NOT DETECTED NOT DETECTED Final   Staphylococcus species DETECTED (A) NOT DETECTED Final    Comment: CRITICAL RESULT CALLED TO, READ BACK  BY AND VERIFIED WITH: SUSAN WATSON @1518  ON 07.01.22.SH    Staphylococcus aureus (BCID) NOT DETECTED NOT DETECTED Final   Staphylococcus epidermidis DETECTED (A) NOT DETECTED Final    Comment: CRITICAL RESULT CALLED TO, READ BACK BY AND VERIFIED WITH: SUSAN WATSON @1518  ON 07.01.22.SH    Staphylococcus lugdunensis NOT DETECTED NOT DETECTED Final   Streptococcus species NOT DETECTED NOT DETECTED Final   Streptococcus agalactiae NOT DETECTED NOT DETECTED Final   Streptococcus pneumoniae NOT DETECTED NOT DETECTED Final   Streptococcus pyogenes NOT DETECTED NOT DETECTED Final   A.calcoaceticus-baumannii NOT DETECTED NOT DETECTED Final   Bacteroides fragilis NOT DETECTED NOT DETECTED Final   Enterobacterales NOT DETECTED NOT DETECTED Final   Enterobacter cloacae complex NOT DETECTED NOT DETECTED Final   Escherichia coli NOT DETECTED NOT DETECTED Final   Klebsiella aerogenes NOT DETECTED NOT DETECTED Final   Klebsiella oxytoca NOT DETECTED NOT DETECTED Final   Klebsiella pneumoniae NOT DETECTED NOT DETECTED Final  Proteus species NOT DETECTED NOT DETECTED Final   Salmonella species NOT DETECTED NOT DETECTED Final   Serratia marcescens NOT DETECTED NOT DETECTED Final   Haemophilus influenzae NOT DETECTED NOT DETECTED Final   Neisseria meningitidis NOT DETECTED NOT DETECTED Final   Pseudomonas aeruginosa NOT DETECTED NOT DETECTED Final   Stenotrophomonas maltophilia NOT DETECTED NOT DETECTED Final   Candida albicans NOT DETECTED NOT DETECTED Final   Candida auris NOT DETECTED NOT DETECTED Final   Candida glabrata NOT DETECTED NOT DETECTED Final   Candida krusei NOT DETECTED NOT DETECTED Final   Candida parapsilosis NOT DETECTED NOT DETECTED Final   Candida tropicalis NOT DETECTED NOT DETECTED Final   Cryptococcus neoformans/gattii NOT DETECTED NOT DETECTED Final   Methicillin resistance mecA/C NOT DETECTED NOT DETECTED Final    Comment: Performed at [facility], [address] [place], [place] [address]  MRSA Next Gen by PCR, Nasal     Status: None   Collection Time: 04/18/21  1:37 AM   Specimen: Nasal Mucosa; Nasal Swab  Result Value Ref Range Status   MRSA by PCR Next Gen NOT DETECTED NOT DETECTED Final    Comment: (NOTE) The GeneXpert MRSA Assay (FDA approved for NASAL specimens only), is one component of a comprehensive MRSA colonization surveillance program. It is not intended to diagnose MRSA infection nor to guide or monitor treatment for MRSA infections. Test performance is not FDA approved in patients less than 11 years old. Performed at [facility], [address], [place], [place] [address]   Urine Culture     Status: None   Collection Time: 04/19/21 11:00 AM   Specimen: Urine, Random  Result Value Ref Range Status   Specimen Description   Final    URINE, RANDOM Performed at [facility], [address] [place], [place] [address]    Special Requests   Final    NONE Performed at [facility], [address], [place], [place] [address]    Culture   Final    NO GROWTH Performed at [facility], [address], [place], [place] [address]    Report Status 04/20/2021 FINAL  Final  CULTURE, BLOOD (ROUTINE X 2) w Reflex to ID Panel     Status: None (Preliminary result)   Collection Time: 04/20/21 12:01 AM   Specimen: BLOOD  Result Value Ref Range Status   Specimen Description BLOOD BLOOD RIGHT FOREARM  Final   Special Requests   Final    BOTTLES DRAWN AEROBIC AND ANAEROBIC Blood Culture adequate volume   Culture   Final    NO GROWTH 2 DAYS Performed at [facility], [address], [place], [place] [address]    Report Status PENDING  Incomplete  CULTURE, BLOOD (ROUTINE X 2) w Reflex to ID Panel     Status: None (Preliminary result)   Collection Time: 04/20/21 12:02 AM   Specimen: BLOOD  Result Value Ref Range Status   Specimen Description BLOOD BLOOD RIGHT HAND  Final   Special Requests    Final    BOTTLES DRAWN AEROBIC AND ANAEROBIC Blood Culture adequate volume   Culture   Final    NO GROWTH 2 DAYS Performed at [facility], [address], [place], [place] [address]    Report Status PENDING  Incomplete    IMAGING RESULTS: MRI of brain reviewed- as above  CT abdomen /chest Bridging osteophytes in the thoracic region suggesting diffuse idiopathic skeletal hyperostosis.NO pulmonary infiltrate I  have personally reviewed the films ? Impression/Recommendation ?Staph capitis in 3/4 bottles And staph epidermidis in 1/4 bottle Both are coag neg staph , skin bacteria and seem to be contaminants in  blood culture.  Clinically and by history there is no indication of infection, or endocarditis 2 d echo reviewed with Dr.End and all valves seem to be okay . As no device, no prosthetic valve, no h/o IVDA endocarditis is unlikely. TEE currently not needed Will discontinue ceftriaxone and observe.  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 ___________________________________________________ Discussed with patient, and his wife and requesting provider Note:  This document was prepared using Dragon voice recognition software and may include unintentional dictation errors.

## 2021-04-23 DIAGNOSIS — G9341 Metabolic encephalopathy: Secondary | ICD-10-CM | POA: Diagnosis not present

## 2021-04-23 DIAGNOSIS — I10 Essential (primary) hypertension: Secondary | ICD-10-CM | POA: Diagnosis not present

## 2021-04-23 DIAGNOSIS — I161 Hypertensive emergency: Secondary | ICD-10-CM | POA: Diagnosis not present

## 2021-04-23 DIAGNOSIS — R7881 Bacteremia: Secondary | ICD-10-CM | POA: Diagnosis not present

## 2021-04-23 DIAGNOSIS — R569 Unspecified convulsions: Secondary | ICD-10-CM | POA: Diagnosis not present

## 2021-04-23 LAB — BASIC METABOLIC PANEL
Anion gap: 4 — ABNORMAL LOW (ref 5–15)
BUN: 20 mg/dL (ref 6–20)
CO2: 27 mmol/L (ref 22–32)
Calcium: 8.5 mg/dL — ABNORMAL LOW (ref 8.9–10.3)
Chloride: 102 mmol/L (ref 98–111)
Creatinine, Ser: 1.87 mg/dL — ABNORMAL HIGH (ref 0.61–1.24)
GFR, Estimated: 44 mL/min — ABNORMAL LOW (ref >60)
Glucose, Bld: 256 mg/dL — ABNORMAL HIGH (ref 70–99)
Potassium: 4.1 mmol/L (ref 3.5–5.1)
Sodium: 133 mmol/L — ABNORMAL LOW (ref 135–145)

## 2021-04-23 LAB — GLUCOSE, CAPILLARY
Glucose-Capillary: 315 mg/dL — ABNORMAL HIGH (ref 70–99)
Glucose-Capillary: 336 mg/dL — ABNORMAL HIGH (ref 70–99)
Glucose-Capillary: 178 mg/dL — ABNORMAL HIGH (ref 70–99)
Glucose-Capillary: 260 mg/dL — ABNORMAL HIGH (ref 70–99)

## 2021-04-23 LAB — MAGNESIUM: Magnesium: 1.9 mg/dL (ref 1.7–2.4)

## 2021-04-23 LAB — PHOSPHORUS: Phosphorus: 3 mg/dL (ref 2.5–4.6)

## 2021-04-23 LAB — ANGIOTENSIN CONVERTING ENZYME: Angiotensin-Converting Enzyme: 29 U/L (ref 14–82)

## 2021-04-23 MED ORDER — METFORMIN HCL ER (MOD) 1000 MG PO TB24: 1000.0000 mg | ORAL_TABLET | Freq: Two times a day (BID) | ORAL | 0 refills | Status: DC

## 2021-04-23 MED ORDER — INSULIN ASPART PROT & ASPART (70-30 MIX) 100 UNIT/ML PEN: 11.0000 [IU] | PEN_INJECTOR | Freq: Two times a day (BID) | SUBCUTANEOUS | 0 refills | Status: DC

## 2021-04-23 MED ORDER — HYDRALAZINE HCL 50 MG PO TABS
50.0000 mg | ORAL_TABLET | Freq: Three times a day (TID) | ORAL | Status: DC
  Administered 2021-04-24 (×2): 50 mg via ORAL
  Filled 2021-04-23 (×2): qty 1

## 2021-04-23 MED ORDER — AMLODIPINE BESYLATE 10 MG PO TABS: 10.0000 mg | ORAL_TABLET | Freq: Every day | ORAL | 0 refills | Status: DC

## 2021-04-23 MED ORDER — CLONIDINE 0.3 MG/24HR TD PTWK: 0.3000 mg | MEDICATED_PATCH | TRANSDERMAL | 0 refills | Status: DC

## 2021-04-23 MED ORDER — LOSARTAN POTASSIUM 100 MG PO TABS: 100.0000 mg | ORAL_TABLET | Freq: Every day | ORAL | 0 refills | Status: DC

## 2021-04-23 MED ORDER — LEVETIRACETAM 1000 MG PO TABS: 1000.0000 mg | ORAL_TABLET | Freq: Two times a day (BID) | ORAL | 2 refills | Status: DC

## 2021-04-23 MED ORDER — CARVEDILOL 25 MG PO TABS: 25.0000 mg | ORAL_TABLET | Freq: Two times a day (BID) | ORAL | 0 refills | Status: DC

## 2021-04-23 MED ORDER — CHLORTHALIDONE 25 MG PO TABS
25.0000 mg | ORAL_TABLET | Freq: Every day | ORAL | Status: DC
  Administered 2021-04-23 – 2021-04-25 (×3): 25 mg via ORAL
  Filled 2021-04-23 (×3): qty 1

## 2021-04-23 NOTE — Plan of Care (Signed)
  Problem: Education: Goal: Knowledge of General Education information will improve Description Including pain rating scale, medication(s)/side effects and non-pharmacologic comfort measures Outcome: Progressing   Problem: Health Behavior/Discharge Planning: Goal: Ability to manage health-related needs will improve Outcome: Progressing   Problem: Clinical Measurements: Goal: Will remain free from infection Outcome: Progressing   Problem: Activity: Goal: Risk for activity intolerance will decrease Outcome: Progressing   Problem: Coping: Goal: Level of anxiety will decrease Outcome: Progressing   Problem: Safety: Goal: Ability to remain free from injury will improve Outcome: Progressing   Problem: Skin Integrity: Goal: Risk for impaired skin integrity will decrease Outcome: Progressing   

## 2021-04-23 NOTE — Progress Notes (Signed)
Date of Admission:  04/17/2021      Subjective: Patient is feeling better today. No nausea Headache is much improved He walked in the room.   Medications:   amLODipine  10 mg Oral Daily   carvedilol  25 mg Oral BID WC   Chlorhexidine Gluconate Cloth  6 each Topical Daily   chlorthalidone  25 mg Oral Daily   cholecalciferol  2,000 Units Oral Daily   heparin  5,000 Units Subcutaneous Q8H   insulin aspart  0-5 Units Subcutaneous QHS   insulin aspart  0-9 Units Subcutaneous TID WC   insulin glargine  15 Units Subcutaneous Daily   losartan  100 mg Oral Daily   tamsulosin  0.4 mg Oral Daily    Objective: Vital signs in last 24 hours: Temp:  [98.2 F (36.8 C)-98.7 F (37.1 C)] 98.3 F (36.8 C) (07/06 1617) Pulse Rate:  [65-74] 70 (07/06 1617) Resp:  [16-18] 18 (07/06 1617) BP: (130-155)/(77-95) 153/92 (07/06 1617) SpO2:  [97 %-100 %] 98 % (07/06 1617)  PHYSICAL EXAM:  General: Alert, cooperative, no distress, appears stated age.  Head: Normocephalic, without obvious abnormality, atraumatic. Eyes: Conjunctivae clear, anicteric sclerae. Pupils are equal ENT Nares normal. No drainage or sinus tenderness. Lips, mucosa, and tongue normal. No Thrush Neck: Supple, symmetrical, no adenopathy, thyroid: non tender no carotid bruit and no JVD. Back: No CVA tenderness. Lungs: Clear to auscultation bilaterally. No Wheezing or Rhonchi. No rales. Heart: Regular rate and rhythm, no murmur, rub or gallop. Abdomen: Soft, non-tender,not distended. Bowel sounds normal. No masses Extremities: atraumatic, no cyanosis. No edema. No clubbing Skin: No rashes or lesions. Or bruising Lymph: Cervical, supraclavicular normal. Neurologic: Grossly non-focal  Lab Results Recent Labs    04/21/21 0445 04/22/21 0514 04/23/21 0444  WBC 9.6  --   --   HGB 13.2  --   --   HCT 38.1*  --   --   NA 134* 135 133*  K 3.8 4.0 4.1  CL 103 106 102  CO2 22 25 27   BUN 26* 19 20  CREATININE 2.01* 1.78*  1.87*   Liver Pa  Microbiology: Blood culture drawn on 04/20/2021 no growth Blood culture drawn on 04/17/2021 staph S and staph epidermidis in 1 set And staph S in 1 bottle of the next set. Studies/Results: EEG adult  Result Date: 04/22/2021 06/23/2021, MD     04/22/2021  3:20 PM Patient Name: Clayton [name] MRN: Clayton [name] [facility]ile[facility] Attending: [id number] Referring Physician/Provider: Dr Charlsie Quest Date: 04/22/2021 Duration: 30 mins Patient history: 50 yo man with new onset GTC in setting of admission for hypertensive urgency with severe headache and nausea after abrupt onset of severe headache during spell of vomiting last week. EEG to evaluate for seizure. Level of alertness: Awake, asleep AEDs during EEG study: LEV Technical aspects: This EEG study was done with scalp electrodes positioned according to the 10-20 International system of electrode placement. Electrical activity was acquired at a sampling rate of 500Hz  and reviewed with a high frequency filter of 70Hz  and a low frequency filter of 1Hz . EEG data were recorded continuously and digitally stored. Description: The posterior dominant rhythm consists of 9 Hz activity of moderate voltage (25-35 uV) seen predominantly in posterior head regions, symmetric and reactive to eye opening and eye closing. Sleep was characterized by vertex waves, sleep spindles (12 to 14 Hz), maximal frontocentral region. EEG showed continuous right frontal 3-6hz  theta-delta slowing. There was also intermittent generalized 3 to  6 Hz theta-delta slowing. Physiologic photic driving was not seen during photic stimulation.  Hyperventilation was not performed.   ABNORMALITY - Continuous slow, right frontal region - Intermittent slow, generalized IMPRESSION: This study is suggestive of cortical dysfunction in right frontal region likely secondary to underlying structural abnormality. There is also mild diffuse encephalopathy, nonspecific etiology. No seizures or  epileptiform discharges were seen throughout the recording. Priyanka Annabelle Harman     Assessment/Plan: Staph S bacteria and 3 out of 4 bottles and blood culture.  Staff epidermidis in 1 out of 4 bottles.  Both are coag negative staph, skin bacteria and likely contaminants.  Clinically and by history there is no evidence of infection or endocarditis.  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  Was noncompliant with medication for the past month. 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  This could be old changes. 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  CKD.  Due to hypertension and diabetes mellitus.  Diabetes mellitus was noncompliant with treatment.  On insulin currently  Discussed the management in great detail with the care team. Discussed with patient and his wife. ID will sign off.  Call if needed.

## 2021-04-23 NOTE — Progress Notes (Addendum)
Inpatient Diabetes Program Recommendations  AACE/ADA: New Consensus Statement on Inpatient Glycemic Control (2015)  Target Ranges:  Prepandial:   less than 140 mg/dL      Peak postprandial:   less than 180 mg/dL (1-2 hours)      Critically ill patients:  140 - 180 mg/dL   Lab Results  Component Value Date   GLUCAP 315 (H) 04/23/2021   HGBA1C 12.0 (H) 04/18/2021    Review of Glycemic Control Results for Clayton [name], Clayton [name] (MRN [id number]) as of 04/23/2021 08:13  Ref. Range 04/22/2021 08:08 04/22/2021 11:34 04/22/2021 16:25 04/22/2021 20:17 04/23/2021 07:30  Glucose-Capillary Latest Ref Range: 70 - 99 mg/dL 834 (H) 196 (H) 222 (H) 372 (H) 315 (H)    Inpatient Diabetes Program Recommendations:   CBGs elevated with eating increased. -Add Novolog 4 units tid if eats 50% Secure chat sent to Dr. Fran Lowes. Dr. Fran Lowes planning patient to be discharged home today on 70/30 insulin. 70/30 11 units bid ac meals would equal approximately 70/30 11 bid would equal 15.4 basal + 6.6 meal coverage  Needs on discharge: Novolog 70/30 Mix Flexpen (Order # [id number]) Insulin Pen Needles 32 gauge x 85mm (211941  Thank you, Billy Fischer. Reeda Soohoo, RN, MSN, CDE  Diabetes Coordinator Inpatient Glycemic Control Team Team Pager [phone] (8am-5pm) 04/23/2021 8:15 AM

## 2021-04-23 NOTE — Progress Notes (Signed)
Neurology Progress Note  Patient ID: 50 yo man with new onset GTC in setting of admission for hypertensive urgency with severe headache and nausea after abrupt onset of severe headache during spell of vomiting last week. 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  Repeat MRI brain 7/2 showed interval development of T2/FLAIR sulcal hyper intensity in bilateral occipital lobes with no corresponding leptomeningeal enhancement c/f proteinaceous fluid vs artifact vs potential subarachnoid blood. Pt has been afebrile. Leukocytosis present on admission has resolved. MRA H&N and subsequent CTA H&N showed no e/o aneurysm or dissection. Patient is at mental status baseline and has no focal neurologic deficits.   Subjective: - Pt denies current neurologic deficits. Wife at bedside states he is back to mental status baseline. No headache currently. - Multiple BP readings out of range yesterday up to 196 systolic. It was discovered tonight that his clonidine patch was no longer on.   Current Outpatient Medications  Medication Instructions   amLODipine (NORVASC) 10 mg, Oral, Daily   BESIVANCE 0.6 % SUSP 1 drop, 3 times daily   carvedilol (COREG) 25 mg, Oral, 2 times daily   carvedilol (COREG) 25 mg, Oral, 2 times daily   chlorthalidone (HYGROTON) 25 mg, Oral, Daily   Cholecalciferol 50 MCG (2000 UT) TABS Oral   [START ON 04/26/2021] cloNIDine (CATAPRES - DOSED IN MG/24 HR) 0.3 mg, Transdermal, Weekly, Change on Saturdays   DUREZOL 0.05 % EMUL 1 drop, 4 times daily   insulin aspart protamine - aspart (NOVOLOG 70/30 MIX) (70-30) 100 UNIT/ML FlexPen 11 Units, Subcutaneous, 2 times daily   levETIRAcetam (KEPPRA) 1,000 mg, Oral, 2 times daily   losartan (COZAAR) 100 mg, Oral, Daily   metFORMIN (GLUMETZA) 1,000 mg, Oral, 2 times daily, Increase from 500 mg.   PROLENSA 0.07 % SOLN 1 drop, Daily at bedtime   tadalafil (CIALIS) 20 MG tablet Oral   Vitamin D (Ergocalciferol) (DRISDOL) 50,000  Units, Oral, Weekly     Current Facility-Administered Medications:    acetaminophen (TYLENOL) tablet 650 mg, 650 mg, Oral, Q6H PRN, Ronnald Ramp, RPH   amLODipine (NORVASC) tablet 10 mg, 10 mg, Oral, Daily, Chipper Herb, Dekui, MD, 10 mg at 04/23/21 0840   carvedilol (COREG) tablet 25 mg, 25 mg, Oral, BID WC, Lorretta Harp, MD, 25 mg at 04/23/21 1723   Chlorhexidine Gluconate Cloth 2 % PADS 6 each, 6 each, Topical, Daily, Lorretta Harp, MD, 6 each at 04/23/21 0841   chlorthalidone (HYGROTON) tablet 25 mg, 25 mg, Oral, Daily, Darlin Priestly, MD, 25 mg at 04/23/21 1723   cholecalciferol (VITAMIN D3) tablet 2,000 Units, 2,000 Units, Oral, Daily, Lorretta Harp, MD, 2,000 Units at 04/23/21 0840   haloperidol lactate (HALDOL) injection 5 mg, 5 mg, Intramuscular, Q6H PRN, Mansy, Jan A, MD   heparin injection 5,000 Units, 5,000 Units, Subcutaneous, Q8H, Lorretta Harp, MD, 5,000 Units at 04/23/21 2153   hydrALAZINE (APRESOLINE) injection 10-20 mg, 10-20 mg, Intravenous, Q4H PRN, Rust-Chester, Britton L, NP, 20 mg at 04/22/21 0841   insulin aspart (novoLOG) injection 0-5 Units, 0-5 Units, Subcutaneous, QHS, Lorretta Harp, MD, 3 Units at 04/23/21 2153   insulin aspart (novoLOG) injection 0-9 Units, 0-9 Units, Subcutaneous, TID WC, Lorretta Harp, MD, 2 Units at 04/23/21 1723   insulin glargine (LANTUS) injection 15 Units, 15 Units, Subcutaneous, Daily, Marrion Coy, MD, 15 Units at 04/23/21 0841   levETIRAcetam (KEPPRA) IVPB 1000 mg/100 mL premix, 1,000 mg, Intravenous, Q12H, Rust-Chester, Cecelia Byars, NP, Stopped at 04/23/21 0900   LORazepam (ATIVAN) injection  1-2 mg, 1-2 mg, Intravenous, Q1H PRN, Mansy, Jan A, MD, 2 mg at 04/18/21 2159   losartan (COZAAR) tablet 100 mg, 100 mg, Oral, Daily, Lorretta Harp, MD, 100 mg at 04/23/21 0840   ondansetron (ZOFRAN) injection 4 mg, 4 mg, Intravenous, Q6H PRN, Marrion Coy, MD, 4 mg at 04/22/21 1215   oxyCODONE-acetaminophen (PERCOCET/ROXICET) 5-325 MG per tablet 1 tablet, 1 tablet, Oral, Q4H PRN,  Marrion Coy, MD, 1 tablet at 04/18/21 1809   tamsulosin (FLOMAX) capsule 0.4 mg, 0.4 mg, Oral, Daily, Marrion Coy, MD, 0.4 mg at 04/23/21 0840   Exam: Vitals:   04/23/21 1617 04/23/21 2001  BP: (!) 153/92 (!) 169/97  Pulse: 70 65  Resp: 18 19  Temp: 98.3 F (36.8 C) 97.9 F (36.6 C)  SpO2: 98% 100%   Vital signs in last 24 hours: Temp:  [97.9 F (36.6 C)-98.7 F (37.1 C)] 97.9 F (36.6 C) (07/06 2001) Pulse Rate:  [65-74] 65 (07/06 2001) Resp:  [18-19] 19 (07/06 2001) BP: (143-169)/(77-97) 169/97 (07/06 2001) SpO2:  [98 %-100 %] 100 % (07/06 2001)   Physical Exam Gen: A&Ox4, NAD HEENT: Atraumatic, normocephalic; oropharynx clear, tongue without atrophy or fasciculations. Resp: CTAB, normal work of breathing CV: RRR, extremities appear well-perfused. Abd: soft/NT/ND Extrem: Nml bulk; no cyanosis, clubbing, or edema.  Neuro: *MS: A&O x4. Follows multi-step commands.  *Speech: no dysarthria or aphasia, able to name and repeat. *CN:    I: Deferred   II,III: PERRLA, VFF by confrontation,    III,IV,VI: EOMI w/o nystagmus, no ptosis although he does have saccadic pursuits   V: Sensation intact from V1 to V3 to LT   VII: Eyelid closure was full.  Smile symmetric.   VIII: Hearing intact to voice   IX,X: Voice normal, palate elevates symmetrically    XI: SCM/trap 5/5 bilat   XII: Tongue protrudes midline, no atrophy or fasciculations  *Motor:   Normal bulk.  No tremor, rigidity or bradykinesia. No pronator drift.  Full confrontational testing not performed today, per Dr. Selina Cooley on last examination:  Strength: Dlt Bic Tri WE WrF FgS Gr HF KnF KnE PlF DoF    Left 5 5 5 5 5 5 5 5 5 5 5 5     Right 5 5 5 5 5 5 5 5 5 5 5 5    *Sensory: Intact to light touch throughout. Symmetric.  No double-simultaneous extinction.  *Coordination:  Finger-to-nose, heel-to-shin, rapid alternating motions were intact. *Reflexes:  2+ and symmetric throughout without clonus; toes down-going  bilat *Gait: deferred  Labs reviewed, UDS obtained as an add-on to 7/2 sample and positive only for benzodiazepines received the night before AKI is resolving and leukocytosis has also resolved  Basic Metabolic Panel: Recent Labs  Lab 04/19/21 0416 04/20/21 0355 04/21/21 0445 04/22/21 0514 04/23/21 0444  NA 133* 133* 134* 135 133*  K 4.1 3.7 3.8 4.0 4.1  CL 99 104 103 106 102  CO2 26 23 22 25 27   GLUCOSE 218* 178* 191* 186* 256*  BUN 36* 33* 26* 19 20  CREATININE 2.57* 2.11* 2.01* 1.78* 1.87*  CALCIUM 8.5* 8.2* 8.4* 8.3* 8.5*  MG 2.3 2.2 2.2 2.2 1.9  PHOS  --   --   --   --  3.0     CBC: Recent Labs  Lab 04/17/21 0940 04/18/21 0440 04/19/21 0416 04/20/21 0355 04/21/21 0445  WBC 17.3* 12.5* 12.3* 8.7 9.6  NEUTROABS  --   --  9.1* 5.9 7.3  HGB 16.1 14.9  13.8 12.6* 13.2  HCT 46.0 41.8 39.3 37.0* 38.1*  MCV 89.7 90.5 90.1 92.3 91.1  PLT 252 230 215 205 213     Coagulation Studies: No results for input(s): LABPROT, INR in the last 72 hours.   EEG: Description: The posterior dominant rhythm consists of 9 Hz activity of moderate voltage (25-35 uV) seen predominantly in posterior head regions, symmetric and reactive to eye opening and eye closing. Sleep was characterized by vertex waves, sleep spindles (12 to 14 Hz), maximal frontocentral region. EEG showed continuous right frontal 3-6hz  theta-delta slowing. There was also intermittent generalized 3 to 6 Hz theta-delta slowing. Physiologic photic driving was not seen during photic stimulation.  Hyperventilation was not performed.    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 There is also mild diffuse encephalopathy, nonspecific etiology. No seizures or epileptiform discharges were seen throughout the recording.   Impression: 50 yo man  with new onset GTC in setting of admission for hypertensive urgency with severe headache and nausea after abrupt onset of severe headache during spell of vomiting last week. 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  Repeat MRI brain 7/2 showed interval development of T2/FLAIR sulcal hyper intensity in bilateral occipital lobes with no corresponding leptomeningeal enhancement c/f proteinaceous fluid vs artifact vs potential subarachnoid blood. Pt has been afebrile. Leukocytosis present on admission has resolved. MRA H&N and subsequent CTA H&N showed no e/o aneurysm or dissection. Patient is at mental status baseline and has no focal neurologic deficits.  Overall believe the most likely etiology of his symptoms is PRES in the setting of uncontrolled hypertension and medication nonadherence.  Mgmt of this condition requires strict blood pressure control and it should be confirmed that the patient is maintaining systolic blood pressures less than 160 without requiring IV as needed's and tolerating good p.o. intake prior to being safe for discharge.  Additionally discussed possibility of aneurysmal bleed with neuro radiology/neuro interventional radiologist, who agreed that MRI findings would be more consistent with subarachnoid hemorrhage secondary to PRES/RCVS and highly unlikely to be an aneurysmal bleed. ID consulting felt that (+) blood cultures were contaminants; abx d/c'd and ID signed off.   Recommendations: - MRA H&N and subsequent CTA H&N showed no e/o aneurysm or dissection.  - Continue holding anti platelets given possible SAH on MRI brain; he has been tolerating DVT prophylaxis Heparin dose so okay to continue this - No indication for abx at this time per ID - Continue keppra 1000mg  bid, may consider weaning on an outpatient basis - BP mgmt goal strict systolic <160,  - STAT head CT and repeat vessel imaging for any decline in neurologic  exam including recurrent severe headache - No valve vegetations on TTE, no indication for TEE at this time given no concern for bacteremia or endocarditis - Pt will be kept for one additional day of BP monitoring to ensure he is at goal SBP <160. His multiple high readings yesterday may have been 2/2 rebound HTN in setting of clonidine patch removal / falling off. Dr. to adjust BP medications and consider clonidine alternative - May be d/c'd tomorrow AM if BP within range  Fran Lowes, MD [facility] [phone]  If 7pm- 7am, please page neurology on call as listed in AMION.

## 2021-04-23 NOTE — Progress Notes (Signed)
PROGRESS NOTE    Clayton [name]  FIE:[id number] DOB: 1970/11/02 DOA: 04/17/2021 PCP: [facility]   Chief complaint.  Severe headache. Brief Narrative:  Clayton [name] is a 50 y.o. male with medical history significant of hypertension, diabetes mellitus, CKD stage IIIb, who presents with nausea, vomiting, headache, right-sided neck pain. Upon arriving the hospital, blood pressure was 247/115.  Patient was continued on home medicines, was also given labetalol IV push, then she was placed on nicardipine drip. MRI brain was performed, showed microhemorrhages, I discussed the case with Dr. Otelia Limes, he has reviewed the MRI images, currently there is no concern for future bleeding.  Okay to continue aspirin and a prophylactic heparin. Carotid MRA did not show any carotid dissection. 7/1.  Discontinued nicardipine, started on clonidine 0.2 mg twice a day.  Increase insulin dose. 7/2.  Patient developed myoclonic and seizure x2, seen by neurology, started Keppra.  MRI obtained.  Blood culture positive for gram-positive cocci. 7/4.  Blood culture is also identified as Staphylococcus capitis. 7/5.  Formal consult from ID obtained, deemed staph capitis is also contaminants.  Antibiotics will be discontinued.   Assessment & Plan:   Principal Problem:   Hypertensive emergency Active Problems:   Nausea & vomiting   Acute metabolic encephalopathy   CKD (chronic kidney disease), stage IIIb   Type II diabetes mellitus with renal manifestations (HCC)   Leukocytosis   Elevated lactic acid level   Elevated troponin   Seizure (HCC)   Bacteremia due to Gram-positive bacteria   Hyponatremia   Acute urinary retention   Hypertensive encephalopathy  #1.  Hypertensive emergency. Hypertensive encephalopathy.   Elevated troponin secondary to hypertensive emergency. Intracranial microhemorrhage. MRI showed peripheral microhemorrhage.  Patient also had altered mental status.  Patient  condition has been improved with better control of BP.   Plan: --cont amlodipine at increased 10 mg daily --cont coreg, chlorthalidone, losartan --d/c clonidine patch (fell off, may have caused rebound HTN) --start hydralazine 50 mg q8h  Medication non-compliance --Had not been taking any of his BP meds or insulin PTA --stressed importance of taking his meds  2.  Myoclonic seizure. Seen by neurology.  This is thought to be secondary to intracranial microhemorrhage. --cont oral Keppra 1000 mg BID  #3.  Positive blood culture, contaminant  Consult from [name] obtained, does not seem to have any infection.  Antibiotics continued, bacterial meningitis unlikely.  #4.  Acute kidney injury on chronic kidney disease stage IIIb. Hyponatremia. Based on serial lab results, patient initially had acute kidney injury due to hypertension emergency.  Creatinine much better.  #5.  Uncontrolled type 2 diabetes with hyperglycemia. --cont Lantus 15u daily --SSI --will discharge on 70/30 BID for ease of administration   6.  Neck pain. Patient had neck pain at time admission, neck MRI angiogram and a CT angiogram both negative for carotid dissection or aneurysm.   --follow up with outpatient neuro for repeat CTangio   DVT prophylaxis: SCDs Code Status: full Family Communication: Disposition Plan:    Status is: Inpatient  Remains inpatient appropriate because:Inpatient level of care appropriate due to severity of illness  Dispo: The patient is from: Home              Anticipated d/c is to: Home              Patient currently is not medically stable to d/c.   Difficult to place patient No    I/O last 3 completed shifts: In:  1200 [P.O.:1200] Out: 1750 [Urine:1750] No intake/output data recorded.     Consultants:  Neurology, ID  Procedures: None  Antimicrobials: none  Subjective: No more N/V.  Pt reported feeling well.    Clonidine patch not found on pt's body today,  unknown when it had fallen off.   Objective: Vitals:   04/23/21 0339 04/23/21 0729 04/23/21 1128 04/23/21 1617  BP: (!) 143/77 (!) 155/95 (!) 148/83 (!) 153/92  Pulse: 65 68 74 70  Resp: 18 18 18 18   Temp: 98.7 F (37.1 C) 98.5 F (36.9 C) 98.4 F (36.9 C) 98.3 F (36.8 C)  TempSrc: Oral Oral Oral Oral  SpO2: 98% 98% 100% 98%  Weight:      Height:        Intake/Output Summary (Last 24 hours) at 04/23/2021 1949 Last data filed at 04/23/2021 1018 Gross per 24 hour  Intake 600 ml  Output 250 ml  Net 350 ml   Filed Weights   04/17/21 0927 04/17/21 2300  Weight: 119.3 kg 119.3 kg    Examination:  Constitutional: NAD, AAOx3 HEENT: conjunctivae and lids normal, EOMI CV: No cyanosis.   RESP: normal respiratory effort, on RA Extremities: No effusions, edema in BLE SKIN: warm, dry Neuro: II - XII grossly intact.   Psych: Normal mood and affect.  Appropriate judgement and reason   Data Reviewed: I have personally reviewed following labs and imaging studies  CBC: Recent Labs  Lab 04/17/21 0940 04/18/21 0440 04/19/21 0416 04/20/21 0355 04/21/21 0445  WBC 17.3* 12.5* 12.3* 8.7 9.6  NEUTROABS  --   --  9.1* 5.9 7.3  HGB 16.1 14.9 13.8 12.6* 13.2  HCT 46.0 41.8 39.3 37.0* 38.1*  MCV 89.7 90.5 90.1 92.3 91.1  PLT 252 230 215 205 213   Basic Metabolic Panel: Recent Labs  Lab 04/19/21 0416 04/20/21 0355 04/21/21 0445 04/22/21 0514 04/23/21 0444  NA 133* 133* 134* 135 133*  K 4.1 3.7 3.8 4.0 4.1  CL 99 104 103 106 102  CO2 26 23 22 25 27   GLUCOSE 218* 178* 191* 186* 256*  BUN 36* 33* 26* 19 20  CREATININE 2.57* 2.11* 2.01* 1.78* 1.87*  CALCIUM 8.5* 8.2* 8.4* 8.3* 8.5*  MG 2.3 2.2 2.2 2.2 1.9  PHOS  --   --   --   --  3.0   GFR: Estimated Creatinine Clearance: 63.5 mL/min (A) (by C-G formula based on SCr of 1.87 mg/dL (H)). Liver Function Tests: No results for input(s): AST, ALT, ALKPHOS, BILITOT, PROT, ALBUMIN in the last 168 hours. Recent Labs  Lab  04/17/21 0940  LIPASE 27   No results for input(s): AMMONIA in the last 168 hours. Coagulation Profile: Recent Labs  Lab 04/17/21 0940  INR 1.0   Cardiac Enzymes: No results for input(s): CKTOTAL, CKMB, CKMBINDEX, TROPONINI in the last 168 hours. BNP (last 3 results) No results for input(s): PROBNP in the last 8760 hours. HbA1C: No results for input(s): HGBA1C in the last 72 hours. CBG: Recent Labs  Lab 04/22/21 1625 04/22/21 2017 04/23/21 0730 04/23/21 1128 04/23/21 1623  GLUCAP 316* 372* 315* 336* 178*   Lipid Profile: No results for input(s): CHOL, HDL, LDLCALC, TRIG, CHOLHDL, LDLDIRECT in the last 72 hours. Thyroid Function Tests: No results for input(s): TSH, T4TOTAL, FREET4, T3FREE, THYROIDAB in the last 72 hours. Anemia Panel: No results for input(s): VITAMINB12, FOLATE, FERRITIN, TIBC, IRON, RETICCTPCT in the last 72 hours. Sepsis Labs: Recent Labs  Lab 04/17/21 0940 04/17/21 1218 04/17/21  1522 04/17/21 1638 04/17/21 1945 04/17/21 2215  PROCALCITON <0.10  --   --   --   --   --   LATICACIDVEN  --    < > 3.0* 2.9* 3.7* 2.5*   < > = values in this interval not displayed.    Recent Results (from the past 240 hour(s))  Resp Panel by RT-PCR (Flu A&B, Covid) Nasopharyngeal Swab     Status: None   Collection Time: 04/17/21  9:40 AM   Specimen: Nasopharyngeal Swab; Nasopharyngeal(NP) swabs in vial transport medium  Result Value Ref Range Status   SARS Coronavirus 2 by RT PCR NEGATIVE NEGATIVE Final    Comment: (NOTE) SARS-CoV-2 target nucleic acids are NOT DETECTED.  The SARS-CoV-2 RNA is generally detectable in upper respiratory specimens during the acute phase of infection. The lowest concentration of SARS-CoV-2 viral copies this assay can detect is 138 copies/mL. A negative result does not preclude SARS-Cov-2 infection and should not be used as the sole basis for treatment or other patient management decisions. A negative result may occur with   improper specimen collection/handling, submission of specimen other than nasopharyngeal swab, presence of viral mutation(s) within the areas targeted by this assay, and inadequate number of viral copies(<138 copies/mL). A negative result must be combined with clinical observations, patient history, and epidemiological information. The expected result is Negative.  Fact Sheet for Patients:  BloggerCourse.com  Fact Sheet for Healthcare Providers:  SeriousBroker.it  This test is no t yet approved or cleared by the [place] FDA and  has been authorized for detection and/or diagnosis of SARS-CoV-2 by FDA under an Emergency Use Authorization (EUA). This EUA will remain  in effect (meaning this test can be used) for the duration of the COVID-19 declaration under Section 564(b)(1) of the Act, 21 U.S.C.section 360bbb-3(b)(1), unless the authorization is terminated  or revoked sooner.       Influenza A by PCR NEGATIVE NEGATIVE Final   Influenza B by PCR NEGATIVE NEGATIVE Final    Comment: (NOTE) The Xpert Xpress SARS-CoV-2/FLU/RSV plus assay is intended as an aid in the diagnosis of influenza from Nasopharyngeal swab specimens and should not be used as a sole basis for treatment. Nasal washings and aspirates are unacceptable for Xpert Xpress SARS-CoV-2/FLU/RSV testing.  Fact Sheet for Patients: BloggerCourse.com  Fact Sheet for Healthcare Providers: SeriousBroker.it  This test is not yet approved or cleared by the [place] FDA and has been authorized for detection and/or diagnosis of SARS-CoV-2 by FDA under an Emergency Use Authorization (EUA). This EUA will remain in effect (meaning this test can be used) for the duration of the COVID-19 declaration under Section 564(b)(1) of the Act, 21 U.S.C. section 360bbb-3(b)(1), unless the authorization is terminated  or revoked.  Performed at [facility], [address] [place], [place] [address]   CULTURE, BLOOD (ROUTINE X 2) w Reflex to ID Panel     Status: Abnormal (Preliminary result)   Collection Time: 04/17/21  4:34 PM   Specimen: BLOOD  Result Value Ref Range Status   Specimen Description   Final    BLOOD RIGHT ANTECUBITAL Performed at [facility], [address], [place], [place] [address]    Special Requests   Final    BOTTLES DRAWN AEROBIC AND ANAEROBIC Blood Culture results may not be optimal due to an inadequate volume of blood received in culture bottles Performed at [facility], [address], [place], [place] [address]    Culture  Setup Time  Final    GRAM POSITIVE COCCI ANAEROBIC BOTTLE ONLY CRITICAL VALUE NOTED.  VALUE IS CONSISTENT WITH PREVIOUSLY REPORTED AND CALLED VALUE. Performed at [facility], [address] [place], [place] [address]    Culture STAPHYLOCOCCUS CAPITIS (A)  Final   Report Status PENDING  Incomplete  CULTURE, BLOOD (ROUTINE X 2) w Reflex to ID Panel     Status: Abnormal (Preliminary result)   Collection Time: 04/17/21  4:38 PM   Specimen: BLOOD  Result Value Ref Range Status   Specimen Description   Final    BLOOD LEFT ANTECUBITAL Performed at [facility], [address] [place], [place] [address]    Special Requests   Final    BOTTLES DRAWN AEROBIC AND ANAEROBIC Blood Culture results may not be optimal due to an inadequate volume of blood received in culture bottles Performed at [facility], [address] [place], [place] [address]    Culture  Setup Time   Final    GRAM POSITIVE COCCI IN BOTH AEROBIC AND ANAEROBIC BOTTLES CRITICAL RESULT CALLED TO, READ BACK BY AND VERIFIED WITH: SUSAN WATSON @1518  ON 07.01.22.SH    Culture (A)  Final    STAPHYLOCOCCUS CAPITIS Sent to [facility] for further susceptibility testing. STAPHYLOCOCCUS EPIDERMIDIS THE SIGNIFICANCE OF ISOLATING  THIS ORGANISM FROM A SINGLE SET OF BLOOD CULTURES WHEN MULTIPLE SETS ARE DRAWN IS UNCERTAIN. PLEASE NOTIFY THE MICROBIOLOGY DEPARTMENT WITHIN ONE WEEK IF SPECIATION AND SENSITIVITIES ARE REQUIRED. Performed at [facility], [address] [place], [place] [address]    Report Status PENDING  Incomplete  Blood Culture ID Panel (Reflexed)     Status: Abnormal   Collection Time: 04/17/21  4:38 PM  Result Value Ref Range Status   Enterococcus faecalis NOT DETECTED NOT DETECTED Final   Enterococcus Faecium NOT DETECTED NOT DETECTED Final   Listeria monocytogenes NOT DETECTED NOT DETECTED Final   Staphylococcus species DETECTED (A) NOT DETECTED Final    Comment: CRITICAL RESULT CALLED TO, READ BACK BY AND VERIFIED WITH: SUSAN WATSON @1518  ON 07.01.22.SH    Staphylococcus aureus (BCID) NOT DETECTED NOT DETECTED Final   Staphylococcus epidermidis DETECTED (A) NOT DETECTED Final    Comment: CRITICAL RESULT CALLED TO, READ BACK BY AND VERIFIED WITH: SUSAN WATSON @1518  ON 07.01.22.SH    Staphylococcus lugdunensis NOT DETECTED NOT DETECTED Final   Streptococcus species NOT DETECTED NOT DETECTED Final   Streptococcus agalactiae NOT DETECTED NOT DETECTED Final   Streptococcus pneumoniae NOT DETECTED NOT DETECTED Final   Streptococcus pyogenes NOT DETECTED NOT DETECTED Final   A.calcoaceticus-baumannii NOT DETECTED NOT DETECTED Final   Bacteroides fragilis NOT DETECTED NOT DETECTED Final   Enterobacterales NOT DETECTED NOT DETECTED Final   Enterobacter cloacae complex NOT DETECTED NOT DETECTED Final   Escherichia coli NOT DETECTED NOT DETECTED Final   Klebsiella aerogenes NOT DETECTED NOT DETECTED Final   Klebsiella oxytoca NOT DETECTED NOT DETECTED Final   Klebsiella pneumoniae NOT DETECTED NOT DETECTED Final   Proteus species NOT DETECTED NOT DETECTED Final   Salmonella species NOT DETECTED NOT DETECTED Final   Serratia marcescens NOT DETECTED NOT DETECTED Final   Haemophilus influenzae  NOT DETECTED NOT DETECTED Final   Neisseria meningitidis NOT DETECTED NOT DETECTED Final   Pseudomonas aeruginosa NOT DETECTED NOT DETECTED Final   Stenotrophomonas maltophilia NOT DETECTED NOT DETECTED Final   Candida albicans NOT DETECTED NOT DETECTED Final   Candida auris NOT DETECTED NOT DETECTED Final   Candida glabrata NOT DETECTED NOT DETECTED  Final   Candida krusei NOT DETECTED NOT DETECTED Final   Candida parapsilosis NOT DETECTED NOT DETECTED Final   Candida tropicalis NOT DETECTED NOT DETECTED Final   Cryptococcus neoformans/gattii NOT DETECTED NOT DETECTED Final   Methicillin resistance mecA/C NOT DETECTED NOT DETECTED Final    Comment: Performed at [facility], [address] [place], [place] [address]  MRSA Next Gen by PCR, Nasal     Status: None   Collection Time: 04/18/21  1:37 AM   Specimen: Nasal Mucosa; Nasal Swab  Result Value Ref Range Status   MRSA by PCR Next Gen NOT DETECTED NOT DETECTED Final    Comment: (NOTE) The GeneXpert MRSA Assay (FDA approved for NASAL specimens only), is one component of a comprehensive MRSA colonization surveillance program. It is not intended to diagnose MRSA infection nor to guide or monitor treatment for MRSA infections. Test performance is not FDA approved in patients less than 51 years old. Performed at [facility], [address], [place], [place] [address]   Urine Culture     Status: None   Collection Time: 04/19/21 11:00 AM   Specimen: Urine, Random  Result Value Ref Range Status   Specimen Description   Final    URINE, RANDOM Performed at [facility], [address], [place], [place] [address]    Special Requests   Final    NONE Performed at [facility], [address] [place], [place] [address]    Culture   Final    NO GROWTH Performed at [facility], [address], [place], [place] [address]    Report Status 04/20/2021 FINAL  Final  CULTURE, BLOOD (ROUTINE X  2) w Reflex to ID Panel     Status: None (Preliminary result)   Collection Time: 04/20/21 12:01 AM   Specimen: BLOOD  Result Value Ref Range Status   Specimen Description BLOOD BLOOD RIGHT FOREARM  Final   Special Requests   Final    BOTTLES DRAWN AEROBIC AND ANAEROBIC Blood Culture adequate volume   Culture   Final    NO GROWTH 3 DAYS Performed at [facility], [address], [place], [place] [address]    Report Status PENDING  Incomplete  CULTURE, BLOOD (ROUTINE X 2) w Reflex to ID Panel     Status: None (Preliminary result)   Collection Time: 04/20/21 12:02 AM   Specimen: BLOOD  Result Value Ref Range Status   Specimen Description BLOOD BLOOD RIGHT HAND  Final   Special Requests   Final    BOTTLES DRAWN AEROBIC AND ANAEROBIC Blood Culture adequate volume   Culture   Final    NO GROWTH 3 DAYS Performed at [facility], [address], [place], [place] [address]    Report Status PENDING  Incomplete         Radiology Studies: EEG adult  Result Date: Apr 24, 2021 Charlsie Quest, MD     April 24, 2021  3:20 PM Patient Name: Clayton [name] MRN: [id number] Epilepsy Attending: Charlsie Quest Referring Physician/Provider: Dr Brooke Dare Date: 04/24/2021 Duration: 30 mins Patient history: 50 yo man with new onset GTC in setting of admission for hypertensive urgency with severe headache and nausea after abrupt onset of severe headache during spell of vomiting last week. EEG to evaluate for seizure. Level of alertness: Awake, asleep AEDs during EEG study: LEV Technical aspects: This EEG study was done with scalp electrodes positioned according to the 10-20 International system of electrode placement.  Electrical activity was acquired at a sampling rate of  and reviewed with a high frequency filter of  and a low frequency filter of . EEG data were recorded continuously and digitally stored. Description: The posterior dominant rhythm consists of 9 Hz activity of  moderate voltage (25-35 uV) seen predominantly in posterior head regions, symmetric and reactive to eye opening and eye closing. Sleep was characterized by vertex waves, sleep spindles (12 to 14 Hz), maximal frontocentral region. EEG showed continuous right frontal 3-6hz  theta-delta slowing. There was also intermittent generalized 3 to 6 Hz theta-delta slowing. Physiologic photic driving was not seen during photic stimulation.  Hyperventilation was not performed.   ABNORMALITY - Continuous slow, right frontal region - Intermittent slow, generalized IMPRESSION: This study is suggestive of cortical dysfunction in right frontal region likely secondary to underlying structural abnormality. There is also mild diffuse encephalopathy, nonspecific etiology. No seizures or epileptiform discharges were seen throughout the recording. Priyanka Annabelle Harman        Scheduled Meds:  amLODipine  10 mg Oral Daily   carvedilol  25 mg Oral BID WC   Chlorhexidine Gluconate Cloth  6 each Topical Daily   chlorthalidone  25 mg Oral Daily   cholecalciferol  2,000 Units Oral Daily   heparin  5,000 Units Subcutaneous Q8H   insulin aspart  0-5 Units Subcutaneous QHS   insulin aspart  0-9 Units Subcutaneous TID WC   insulin glargine  15 Units Subcutaneous Daily   losartan  100 mg Oral Daily   tamsulosin  0.4 mg Oral Daily   Continuous Infusions:  levETIRAcetam Stopped (04/23/21 0900)     LOS: 6 days     Darlin Priestly, MD [facility]   To contact the attending provider between 7A-7P or the covering provider during after hours 7P-7A, please log into the web site [email or web address] and access using universal [place] password for that web site. If you do not have the password, please call the hospital operator.  04/23/2021, 7:49 PM

## 2021-04-24 DIAGNOSIS — I10 Essential (primary) hypertension: Secondary | ICD-10-CM | POA: Diagnosis not present

## 2021-04-24 LAB — GLUCOSE, CAPILLARY
Glucose-Capillary: 190 mg/dL — ABNORMAL HIGH (ref 70–99)
Glucose-Capillary: 290 mg/dL — ABNORMAL HIGH (ref 70–99)
Glucose-Capillary: 286 mg/dL — ABNORMAL HIGH (ref 70–99)
Glucose-Capillary: 246 mg/dL — ABNORMAL HIGH (ref 70–99)

## 2021-04-24 MED ORDER — HYDRALAZINE HCL 50 MG PO TABS
75.0000 mg | ORAL_TABLET | Freq: Three times a day (TID) | ORAL | Status: DC
  Administered 2021-04-24 – 2021-04-25 (×2): 75 mg via ORAL
  Filled 2021-04-24 (×2): qty 1

## 2021-04-24 MED ORDER — LEVETIRACETAM 500 MG PO TABS
1000.0000 mg | ORAL_TABLET | Freq: Two times a day (BID) | ORAL | Status: DC
  Administered 2021-04-24 – 2021-04-25 (×2): 1000 mg via ORAL
  Filled 2021-04-24 (×2): qty 2

## 2021-04-24 MED ORDER — INSULIN ASPART PROT & ASPART (70-30 MIX) 100 UNIT/ML ~~LOC~~ SUSP
15.0000 [IU] | Freq: Two times a day (BID) | SUBCUTANEOUS | Status: DC
  Administered 2021-04-25: 15 [IU] via SUBCUTANEOUS
  Filled 2021-04-24: qty 10

## 2021-04-24 NOTE — Progress Notes (Signed)
Inpatient Diabetes Program Recommendations  AACE/ADA: New Consensus Statement on Inpatient Glycemic Control (2015)  Target Ranges:  Prepandial:   less than 140 mg/dL      Peak postprandial:   less than 180 mg/dL (1-2 hours)      Critically ill patients:  140 - 180 mg/dL   Lab Results  Component Value Date   GLUCAP 290 (H) 04/24/2021   HGBA1C 12.0 (H) 04/18/2021    Review of Glycemic Control Results for Clayton, [name] (MRN [id number]) as of 04/24/2021 11:32  Ref. Range 04/23/2021 07:30 04/23/2021 11:28 04/23/2021 16:23 04/23/2021 21:06 04/24/2021 07:19 04/24/2021 11:21  Glucose-Capillary Latest Ref Range: 70 - 99 mg/dL 131 (H) 438 (H) 887 (H) 260 (H) 190 (H) 290 (H)   Inpatient Diabetes Program Recommendations:    Glucose trends elevated still today - increase Lantus to 20 units - Add Novolog 4 units tid if eats 50%  70/30 dose 15 units bid equivalent to 20 units of basal and 9 units of meal coverage for all meals  Needs on discharge: Novolog 70/30 Mix Flexpen (Order # [id number]) Insulin Pen Needles 32 gauge x 4mm ([id number])  Christena Deem RN, MSN, BC-ADM Inpatient Diabetes Coordinator Team Pager [phone] (8a-5p)

## 2021-04-24 NOTE — Progress Notes (Addendum)
PROGRESS NOTE    Clayton [name]  IRS:[id number] DOB: 04/11/1971 DOA: 04/17/2021 PCP: [facility]   Chief complaint.  Severe headache. Brief Narrative:  Clayton [name] is a 50 y.o. male with medical history significant of hypertension, diabetes mellitus, CKD stage IIIb, who presents with nausea, vomiting, headache, right-sided neck pain. Upon arriving the hospital, blood pressure was 247/115.  Patient was continued on home medicines, was also given labetalol IV push, then she was placed on nicardipine drip. MRI brain was performed, showed microhemorrhages, I discussed the case with Dr. Otelia Limes, he has reviewed the MRI images, currently there is no concern for future bleeding.  Okay to continue aspirin and a prophylactic heparin. Carotid MRA did not show any carotid dissection. 7/1.  Discontinued nicardipine, started on clonidine 0.2 mg twice a day.  Increase insulin dose. 7/2.  Patient developed myoclonic and seizure x2, seen by neurology, started Keppra.  MRI obtained.  Blood culture positive for gram-positive cocci. 7/4.  Blood culture is also identified as Staphylococcus capitis. 7/5.  Formal consult from ID obtained, deemed staph capitis is also contaminants.  Antibiotics will be discontinued.   Assessment & Plan:   Principal Problem:   Hypertensive emergency Active Problems:   Nausea & vomiting   Acute metabolic encephalopathy   CKD (chronic kidney disease), stage IIIb   Type II diabetes mellitus with renal manifestations (HCC)   Leukocytosis   Elevated lactic acid level   Elevated troponin   Seizure (HCC)   Bacteremia due to Gram-positive bacteria   Hyponatremia   Acute urinary retention   Hypertensive encephalopathy  #1.  Hypertensive emergency. Hypertensive encephalopathy.   Elevated troponin secondary to hypertensive emergency. Intracranial microhemorrhage. MRI showed peripheral microhemorrhage.  Patient also had altered mental status.  Patient  condition has been improved with better control of BP.   --d/c'ed clonidine patch (fell off, may have caused rebound HTN) Plan: --cont amlodipine at increased 10 mg daily --cont coreg, chlorthalidone, losartan --increase hydralazine to 75 mg q8h --systolic goal <160  Medication non-compliance --Had not been taking any of his BP meds or insulin PTA --stressed importance of taking his meds  2.  Myoclonic seizure. Seen by neurology.  This is thought to be secondary to intracranial microhemorrhage. --cont Keppra 1000 mg BID as oral  #3.  Positive blood culture, contaminant  Consult from [name] obtained, does not seem to have any infection.  Antibiotics continued, bacterial meningitis unlikely.  #4.  Acute kidney injury on chronic kidney disease stage IIIb. Hyponatremia. Based on serial lab results, patient initially had acute kidney injury due to hypertension emergency.  Creatinine much better. --encourage oral hydration  #5.  Uncontrolled type 2 diabetes with hyperglycemia. --d/c Lantus 15u daily --SSI  --transition to 70/30 15u BID starting tomorrow   6.  Neck pain. Patient had neck pain at time admission, neck MRI angiogram and a CT angiogram both negative for carotid dissection or aneurysm.   --follow up with outpatient neuro for repeat CTangio   DVT prophylaxis: heparin subQ Code Status: full Family Communication:  Disposition Plan:    Status is: Inpatient  Remains inpatient appropriate because:Inpatient level of care appropriate due to severity of illness  Dispo: The patient is from: Home              Anticipated d/c is to: Home              Patient currently is not medically stable to d/c.  Need systolic <160 for 1 day.  Difficult to place patient No    I/O last 3 completed shifts: In: 840 [P.O.:840] Out: 250 [Urine:250] Total I/O In: 360 [P.O.:360] Out: 1500 [Urine:1500]     Consultants:  Neurology, ID  Procedures: None  Antimicrobials:  none  Subjective: BP remained elevated >160's last night, so hydralazine added, however, pt was sleeping so deeply, he couldn't be woken up to take the medication.  This morning, BP >190's.  Pt reported feeling fine though.  No pain.  No N/V.   Objective: Vitals:   04/24/21 0300 04/24/21 0716 04/24/21 1122 04/24/21 1606  BP: (!) 179/98 (!) 190/101 (!) 168/98 (!) 147/81  Pulse: 68 66 79 71  Resp: 20 18 18 18   Temp: 98.6 F (37 C) 98.3 F (36.8 C) 97.9 F (36.6 C) 97.9 F (36.6 C)  TempSrc: Oral Oral Oral   SpO2: 97% 98% 98% 94%  Weight:      Height:        Intake/Output Summary (Last 24 hours) at 04/24/2021 1653 Last data filed at 04/24/2021 1508 Gross per 24 hour  Intake 600 ml  Output 1500 ml  Net -900 ml   Filed Weights   04/17/21 0927 04/17/21 2300  Weight: 119.3 kg 119.3 kg    Examination:  Constitutional: NAD, AAOx3 HEENT: conjunctivae and lids normal, EOMI CV: No cyanosis.   RESP: normal respiratory effort, on RA Extremities: No effusions, edema in BLE SKIN: warm, dry Neuro: II - XII grossly intact.   Psych: Normal mood and affect.  Appropriate judgement and reason   Data Reviewed: I have personally reviewed following labs and imaging studies  CBC: Recent Labs  Lab 04/18/21 0440 04/19/21 0416 04/20/21 0355 04/21/21 0445  WBC 12.5* 12.3* 8.7 9.6  NEUTROABS  --  9.1* 5.9 7.3  HGB 14.9 13.8 12.6* 13.2  HCT 41.8 39.3 37.0* 38.1*  MCV 90.5 90.1 92.3 91.1  PLT 230 215 205 213   Basic Metabolic Panel: Recent Labs  Lab 04/19/21 0416 04/20/21 0355 04/21/21 0445 04/22/21 0514 04/23/21 0444  NA 133* 133* 134* 135 133*  K 4.1 3.7 3.8 4.0 4.1  CL 99 104 103 106 102  CO2 26 23 22 25 27   GLUCOSE 218* 178* 191* 186* 256*  BUN 36* 33* 26* 19 20  CREATININE 2.57* 2.11* 2.01* 1.78* 1.87*  CALCIUM 8.5* 8.2* 8.4* 8.3* 8.5*  MG 2.3 2.2 2.2 2.2 1.9  PHOS  --   --   --   --  3.0   GFR: Estimated Creatinine Clearance: 63.5 mL/min (A) (by C-G formula based  on SCr of 1.87 mg/dL (H)). Liver Function Tests: No results for input(s): AST, ALT, ALKPHOS, BILITOT, PROT, ALBUMIN in the last 168 hours. No results for input(s): LIPASE, AMYLASE in the last 168 hours.  No results for input(s): AMMONIA in the last 168 hours. Coagulation Profile: No results for input(s): INR, PROTIME in the last 168 hours.  Cardiac Enzymes: No results for input(s): CKTOTAL, CKMB, CKMBINDEX, TROPONINI in the last 168 hours. BNP (last 3 results) No results for input(s): PROBNP in the last 8760 hours. HbA1C: No results for input(s): HGBA1C in the last 72 hours. CBG: Recent Labs  Lab 04/23/21 1623 04/23/21 2106 04/24/21 0719 04/24/21 1121 04/24/21 1606  GLUCAP 178* 260* 190* 290* 286*   Lipid Profile: No results for input(s): CHOL, HDL, LDLCALC, TRIG, CHOLHDL, LDLDIRECT in the last 72 hours. Thyroid Function Tests: No results for input(s): TSH, T4TOTAL, FREET4, T3FREE, THYROIDAB in the last 72 hours. Anemia Panel:  No results for input(s): VITAMINB12, FOLATE, FERRITIN, TIBC, IRON, RETICCTPCT in the last 72 hours. Sepsis Labs: Recent Labs  Lab 04/17/21 1945 04/17/21 2215  LATICACIDVEN 3.7* 2.5*    Recent Results (from the past 240 hour(s))  Resp Panel by RT-PCR (Flu A&B, Covid) Nasopharyngeal Swab     Status: None   Collection Time: 04/17/21  9:40 AM   Specimen: Nasopharyngeal Swab; Nasopharyngeal(NP) swabs in vial transport medium  Result Value Ref Range Status   SARS Coronavirus 2 by RT PCR NEGATIVE NEGATIVE Final    Comment: (NOTE) SARS-CoV-2 target nucleic acids are NOT DETECTED.  The SARS-CoV-2 RNA is generally detectable in upper respiratory specimens during the acute phase of infection. The lowest concentration of SARS-CoV-2 viral copies this assay can detect is 138 copies/mL. A negative result does not preclude SARS-Cov-2 infection and should not be used as the sole basis for treatment or other patient management decisions. A negative result may  occur with  improper specimen collection/handling, submission of specimen other than nasopharyngeal swab, presence of viral mutation(s) within the areas targeted by this assay, and inadequate number of viral copies(<138 copies/mL). A negative result must be combined with clinical observations, patient history, and epidemiological information. The expected result is Negative.  Fact Sheet for Patients:  BloggerCourse.com  Fact Sheet for Healthcare Providers:  SeriousBroker.it  This test is no t yet approved or cleared by the [place] FDA and  has been authorized for detection and/or diagnosis of SARS-CoV-2 by FDA under an Emergency Use Authorization (EUA). This EUA will remain  in effect (meaning this test can be used) for the duration of the COVID-19 declaration under Section 564(b)(1) of the Act, 21 U.S.C.section 360bbb-3(b)(1), unless the authorization is terminated  or revoked sooner.       Influenza A by PCR NEGATIVE NEGATIVE Final   Influenza B by PCR NEGATIVE NEGATIVE Final    Comment: (NOTE) The Xpert Xpress SARS-CoV-2/FLU/RSV plus assay is intended as an aid in the diagnosis of influenza from Nasopharyngeal swab specimens and should not be used as a sole basis for treatment. Nasal washings and aspirates are unacceptable for Xpert Xpress SARS-CoV-2/FLU/RSV testing.  Fact Sheet for Patients: BloggerCourse.com  Fact Sheet for Healthcare Providers: SeriousBroker.it  This test is not yet approved or cleared by the [place] FDA and has been authorized for detection and/or diagnosis of SARS-CoV-2 by FDA under an Emergency Use Authorization (EUA). This EUA will remain in effect (meaning this test can be used) for the duration of the COVID-19 declaration under Section 564(b)(1) of the Act, 21 U.S.C. section 360bbb-3(b)(1), unless the authorization is terminated  or revoked.  Performed at [facility], [address] [place], [place] [address]   CULTURE, BLOOD (ROUTINE X 2) w Reflex to ID Panel     Status: Abnormal (Preliminary result)   Collection Time: 04/17/21  4:34 PM   Specimen: BLOOD  Result Value Ref Range Status   Specimen Description   Final    BLOOD RIGHT ANTECUBITAL Performed at [facility], [address] [place], [place] [address]    Special Requests   Final    BOTTLES DRAWN AEROBIC AND ANAEROBIC Blood Culture results may not be optimal due to an inadequate volume of blood received in culture bottles Performed at [facility], [address], [place], [place] [address]    Culture  Setup Time   Final    GRAM POSITIVE COCCI ANAEROBIC BOTTLE ONLY CRITICAL VALUE NOTED.  VALUE IS CONSISTENT WITH  PREVIOUSLY REPORTED AND CALLED VALUE. Performed at [facility], [address], [place], [place] [address]    Culture STAPHYLOCOCCUS CAPITIS (A)  Final   Report Status PENDING  Incomplete  CULTURE, BLOOD (ROUTINE X 2) w Reflex to ID Panel     Status: Abnormal (Preliminary result)   Collection Time: 04/17/21  4:38 PM   Specimen: BLOOD  Result Value Ref Range Status   Specimen Description   Final    BLOOD LEFT ANTECUBITAL Performed at [facility], [address] [place], [place] [address]    Special Requests   Final    BOTTLES DRAWN AEROBIC AND ANAEROBIC Blood Culture results may not be optimal due to an inadequate volume of blood received in culture bottles Performed at [facility], [address] [place], [place] [address]    Culture  Setup Time   Final    GRAM POSITIVE COCCI IN BOTH AEROBIC AND ANAEROBIC BOTTLES CRITICAL RESULT CALLED TO, READ BACK BY AND VERIFIED WITH: SUSAN WATSON  ON 07.01.22.SH    Culture (A)  Final    STAPHYLOCOCCUS CAPITIS Sent to [facility] for further susceptibility testing. STAPHYLOCOCCUS EPIDERMIDIS THE SIGNIFICANCE OF ISOLATING  THIS ORGANISM FROM A SINGLE SET OF BLOOD CULTURES WHEN MULTIPLE SETS ARE DRAWN IS UNCERTAIN. PLEASE NOTIFY THE MICROBIOLOGY DEPARTMENT WITHIN ONE WEEK IF SPECIATION AND SENSITIVITIES ARE REQUIRED. Performed at [facility], [address], [place], [place] [address]    Report Status PENDING  Incomplete  Blood Culture ID Panel (Reflexed)     Status: Abnormal   Collection Time: 04/17/21  4:38 PM  Result Value Ref Range Status   Enterococcus faecalis NOT DETECTED NOT DETECTED Final   Enterococcus Faecium NOT DETECTED NOT DETECTED Final   Listeria monocytogenes NOT DETECTED NOT DETECTED Final   Staphylococcus species DETECTED (A) NOT DETECTED Final    Comment: CRITICAL RESULT CALLED TO, READ BACK BY AND VERIFIED WITH: SUSAN WATSON  ON 07.01.22.SH    Staphylococcus aureus (BCID) NOT DETECTED NOT DETECTED Final   Staphylococcus epidermidis DETECTED (A) NOT DETECTED Final    Comment: CRITICAL RESULT CALLED TO, READ BACK BY AND VERIFIED WITH: SUSAN WATSON  ON 07.01.22.SH    Staphylococcus lugdunensis NOT DETECTED NOT DETECTED Final   Streptococcus species NOT DETECTED NOT DETECTED Final   Streptococcus agalactiae NOT DETECTED NOT DETECTED Final   Streptococcus pneumoniae NOT DETECTED NOT DETECTED Final   Streptococcus pyogenes NOT DETECTED NOT DETECTED Final   A.calcoaceticus-baumannii NOT DETECTED NOT DETECTED Final   Bacteroides fragilis NOT DETECTED NOT DETECTED Final   Enterobacterales NOT DETECTED NOT DETECTED Final   Enterobacter cloacae complex NOT DETECTED NOT DETECTED Final   Escherichia coli NOT DETECTED NOT DETECTED Final   Klebsiella aerogenes NOT DETECTED NOT DETECTED Final   Klebsiella oxytoca NOT DETECTED NOT DETECTED Final   Klebsiella pneumoniae NOT DETECTED NOT DETECTED Final   Proteus species NOT DETECTED NOT DETECTED Final   Salmonella species NOT DETECTED NOT DETECTED Final   Serratia marcescens NOT DETECTED NOT DETECTED Final   Haemophilus influenzae  NOT DETECTED NOT DETECTED Final   Neisseria meningitidis NOT DETECTED NOT DETECTED Final   Pseudomonas aeruginosa NOT DETECTED NOT DETECTED Final   Stenotrophomonas maltophilia NOT DETECTED NOT DETECTED Final   Candida albicans NOT DETECTED NOT DETECTED Final   Candida auris NOT DETECTED NOT DETECTED Final   Candida glabrata NOT DETECTED NOT DETECTED Final   Candida krusei NOT DETECTED NOT DETECTED Final   Candida parapsilosis NOT DETECTED NOT DETECTED  Final   Candida tropicalis NOT DETECTED NOT DETECTED Final   Cryptococcus neoformans/gattii NOT DETECTED NOT DETECTED Final   Methicillin resistance mecA/C NOT DETECTED NOT DETECTED Final    Comment: Performed at [facility], [address] [place], [place] [address]  MRSA Next Gen by PCR, Nasal     Status: None   Collection Time: 04/18/21  1:37 AM   Specimen: Nasal Mucosa; Nasal Swab  Result Value Ref Range Status   MRSA by PCR Next Gen NOT DETECTED NOT DETECTED Final    Comment: (NOTE) The GeneXpert MRSA Assay (FDA approved for NASAL specimens only), is one component of a comprehensive MRSA colonization surveillance program. It is not intended to diagnose MRSA infection nor to guide or monitor treatment for MRSA infections. Test performance is not FDA approved in patients less than 49 years old. Performed at [facility], [address] [place], [place] [address]   Urine Culture     Status: None   Collection Time: 04/19/21 11:00 AM   Specimen: Urine, Random  Result Value Ref Range Status   Specimen Description   Final    URINE, RANDOM Performed at [facility], [address] [place], [place] [address]    Special Requests   Final    NONE Performed at [facility], [address] [place], [place] [address]    Culture   Final    NO GROWTH Performed at [facility], [address] [place], [place] [address]    Report Status 04/20/2021 FINAL  Final  CULTURE, BLOOD (ROUTINE X  2) w Reflex to ID Panel     Status: None (Preliminary result)   Collection Time: 04/20/21 12:01 AM   Specimen: BLOOD  Result Value Ref Range Status   Specimen Description BLOOD BLOOD RIGHT FOREARM  Final   Special Requests   Final    BOTTLES DRAWN AEROBIC AND ANAEROBIC Blood Culture adequate volume   Culture   Final    NO GROWTH 4 DAYS Performed at [facility], [address] [place], [place] [address]    Report Status PENDING  Incomplete  CULTURE, BLOOD (ROUTINE X 2) w Reflex to ID Panel     Status: None (Preliminary result)   Collection Time: 04/20/21 12:02 AM   Specimen: BLOOD  Result Value Ref Range Status   Specimen Description BLOOD BLOOD RIGHT HAND  Final   Special Requests   Final    BOTTLES DRAWN AEROBIC AND ANAEROBIC Blood Culture adequate volume   Culture   Final    NO GROWTH 4 DAYS Performed at [facility], [address] [place], [place] [address]    Report Status PENDING  Incomplete         Radiology Studies: No results found.      Scheduled Meds:  amLODipine  10 mg Oral Daily   carvedilol  25 mg Oral BID WC   Chlorhexidine Gluconate Cloth  6 each Topical Daily   chlorthalidone  25 mg Oral Daily   cholecalciferol  2,000 Units Oral Daily   heparin  5,000 Units Subcutaneous Q8H   hydrALAZINE  75 mg Oral Q8H   insulin aspart  0-5 Units Subcutaneous QHS   insulin aspart  0-9 Units Subcutaneous TID WC   insulin glargine  15 Units Subcutaneous Daily   losartan  100 mg Oral Daily   tamsulosin  0.4 mg Oral Daily   Continuous Infusions:  levETIRAcetam 1,000 mg (04/24/21 0913)  LOS: 7 days     Darlin Priestly, MD [facility]   To contact the attending provider between 7A-7P or the covering provider during after hours 7P-7A, please log into the web site [email or web address] and access using universal [place] password for that web site. If you do not have the password, please call the hospital operator.  04/24/2021, 4:53 PM

## 2021-04-25 DIAGNOSIS — I10 Essential (primary) hypertension: Secondary | ICD-10-CM | POA: Diagnosis not present

## 2021-04-25 LAB — CULTURE, BLOOD (ROUTINE X 2)
Culture: NO GROWTH
Culture: NO GROWTH
Special Requests: ADEQUATE
Special Requests: ADEQUATE

## 2021-04-25 LAB — GLUCOSE, CAPILLARY
Glucose-Capillary: 190 mg/dL — ABNORMAL HIGH (ref 70–99)
Glucose-Capillary: 215 mg/dL — ABNORMAL HIGH (ref 70–99)

## 2021-04-25 MED ORDER — CHLORTHALIDONE 25 MG PO TABS: 25.0000 mg | ORAL_TABLET | Freq: Every day | ORAL | 0 refills | Status: DC

## 2021-04-25 MED ORDER — HYDRALAZINE HCL 50 MG PO TABS: 75.0000 mg | ORAL_TABLET | Freq: Three times a day (TID) | ORAL | 0 refills | Status: DC

## 2021-04-25 MED ORDER — INSULIN ASPART PROT & ASPART (70-30 MIX) 100 UNIT/ML PEN: 15.0000 [IU] | PEN_INJECTOR | Freq: Two times a day (BID) | SUBCUTANEOUS | 0 refills | Status: DC

## 2021-04-25 MED ORDER — INSULIN LISPRO PROT & LISPRO (75-25 MIX) 100 UNIT/ML KWIKPEN: 15.0000 [IU] | PEN_INJECTOR | Freq: Two times a day (BID) | SUBCUTANEOUS | 0 refills | Status: DC

## 2021-04-25 NOTE — Discharge Summary (Addendum)
Physician Discharge Summary   Clayton [name]  male DOB: August 14, 1971  OIB:[id number]  PCP: [facility]  Admit date: 04/17/2021 Discharge date: 04/25/2021  Admitted From: home Disposition:  home CODE STATUS: Full code  Discharge Instructions     Discharge instructions   Complete by: As directed    Your blood pressure was very high, which caused some small brain bleeds.  All your blood pressure medications have been refilled.  I have also added new blood pressure medication hydralazine.  Please take your blood pressure medications as directed.    Your diabetes is also very uncontrolled.  I have increased your metformin and restarted you on insulin.  Please take them as directed.  Please follow up with neurology Dr. Malvin Johns as outpatient.   Dr. Darlin Priestly [facility] Course:  For full details, please see H&P, progress notes, consult notes and ancillary notes.  Briefly,  Clayton [name] is a 50 y.o. male with medical history significant of hypertension, diabetes mellitus, CKD stage IIIb, medication non-compliance who presented with nausea, vomiting, headache, right-sided neck pain.  Upon arriving the hospital, blood pressure was 247/115.  Patient was continued on home medicines, was also given labetalol IV push, then was placed on nicardipine drip. MRI brain was performed, showed microhemorrhages.  Carotid MRA did not show any carotid dissection.   7/1.  Discontinued nicardipine, started on clonidine 0.2 mg twice a day.  Increase insulin dose. 7/2.  Patient developed myoclonic and seizure x2, seen by neurology, started Keppra.  MRI obtained.  Blood culture positive for gram-positive cocci. 7/4.  Blood culture is also identified as Staphylococcus capitis. 7/5.  Formal consult from ID obtained, deemed staph capitis is also contaminants.  Antibiotics will be discontinued.  # Hypertensive emergency. # Hypertensive encephalopathy.   # Intracranial  microhemorrhage. MRI showed peripheral microhemorrhage.  Patient also had altered mental status.  Patient condition improved with better control of BP.   --Neuro consulted and set strict systolic goal <160.   --Pt was prescribed several BP meds at home, but had not been taking any of them.   --clonidine patch was initially prescribed (new), however, it fell off without being noticed, so it was not continued. --cont amlodipine at increased 10 mg daily --cont home coreg, chlorthalidone, losartan --cont hydralazine to 75 mg q8h (new) --with above regimen, pt's systolic was <160's for 24 hours before discharge. --pt will follow up with outpatient neurology 1 week after discharge.   Medication non-compliance --Had not been taking any of his BP meds or insulin PTA --stressed importance of taking his meds   Mildly Elevated troponin secondary to hypertensive emergency.  Myoclonic seizure. Seen by neurology.  This is thought to be secondary to intracranial microhemorrhage. --Pt started on Keppra 1000 mg BID.  Pt will follow with outpatient neurology for possible discontinuing Keppra in the future.   Positive blood culture, contaminant  Consult from [name] obtained, did not seem to have any infection.  Bacterial meningitis unlikely.  Abx discontinued.   Acute kidney injury, resolved chronic kidney disease stage IIIb. Hyponatremia. Based on serial lab results, patient had acute kidney injury due to hypertension emergency.  Cr peaked at 2.57, and improved to 1.87 prior to discharge.  Uncontrolled type 2 diabetes with hyperglycemia. A1c 12.0.  Pt wasn't taking hypoglycemics at home.  Pt's metformin was increased to 1000 mg BID. Based on the insulin usage during hospitalization, pt was discharged on 75/25 15u  BID.  Neck pain. Patient had neck pain at time admission, neck MRI angiogram and a CT angiogram both negative for carotid dissection or aneurysm.   --follow up with outpatient  neuro for repeat CTangio   Discharge Diagnoses:  Principal Problem:   Hypertensive emergency Active Problems:   Nausea & vomiting   CKD (chronic kidney disease), stage IIIb   Type II diabetes mellitus with renal manifestations (HCC)   Leukocytosis   Elevated lactic acid level   Elevated troponin   Seizure (HCC)   Bacteremia due to Gram-positive bacteria   Hyponatremia   Acute urinary retention   Hypertensive encephalopathy   30 Day Unplanned Readmission Risk Score    Flowsheet Row ED to Hosp-Admission (Current) from 04/17/2021 in [facility]  30 Day Unplanned Readmission Risk Score (%) 20.65 Filed at 04/25/2021 0801       This score is the patient's risk of an unplanned readmission within 30 days of being discharged (0 -100%). The score is based on dignosis, age, lab data, medications, orders, and past utilization.   Low:  0-14.9   Medium: 15-21.9   High: 22-29.9   Extreme: 30 and above          Discharge Instructions:  Allergies as of 04/25/2021   No Known Allergies      Medication List     STOP taking these medications    Besivance 0.6 % Susp Generic drug: Besifloxacin HCl   Durezol 0.05 % Emul Generic drug: Difluprednate   Prolensa 0.07 % Soln Generic drug: Bromfenac Sodium       TAKE these medications    amLODipine 10 MG tablet Commonly known as: NORVASC Take 1 tablet (10 mg total) by mouth daily.   carvedilol 25 MG tablet Commonly known as: COREG Take 1 tablet (25 mg total) by mouth 2 (two) times daily. What changed: Another medication with the same name was removed. Continue taking this medication, and follow the directions you see here.   chlorthalidone 25 MG tablet Commonly known as: HYGROTON Take 1 tablet (25 mg total) by mouth daily.   Cholecalciferol 50 MCG (2000 UT) Tabs Take by mouth.   hydrALAZINE 50 MG tablet Commonly known as: APRESOLINE Take 1.5 tablets (75 mg total) by mouth 3 (three) times daily.    Insulin Lispro Prot & Lispro (75-25) 100 UNIT/ML Kwikpen Commonly known as: HUMALOG 75/25 MIX Inject 15 Units into the skin 2 (two) times daily. Can give equivalent if covered by insurance.  Pls provide needles.   levETIRAcetam 1000 MG tablet Commonly known as: Keppra Take 1 tablet (1,000 mg total) by mouth 2 (two) times daily.   losartan 100 MG tablet Commonly known as: COZAAR Take 1 tablet (100 mg total) by mouth daily.   metFORMIN 1000 MG (MOD) 24 hr tablet Commonly known as: GLUMETZA Take 1 tablet (1,000 mg total) by mouth 2 (two) times daily. Increase from 500 mg. What changed:  medication strength how much to take additional instructions   tadalafil 20 MG tablet Commonly known as: CIALIS Take by mouth.   Vitamin D (Ergocalciferol) 1.25 MG (50000 UNIT) Caps capsule Commonly known as: DRISDOL Take 50,000 Units by mouth once a week.         Follow-up Information     [facility] Follow up in 1 week(s).   Why: Patient will need to make a follow up appointment. Contact information: [address] [place] [address] [phone]  Morene Crocker, MD Follow up in 1 week(s).   Specialty: Neurology Why: hospital followup. Contact information: [address] [facility] [place] [address] [phone]                 No Known Allergies   The results of significant diagnostics from this hospitalization (including imaging, microbiology, ancillary and laboratory) are listed below for reference.   Consultations:   Procedures/Studies: CT ANGIO HEAD NECK W WO CM  Result Date: 04/21/2021 CLINICAL DATA:  50 year old male with seizure and left side weakness. Hypertensive. Positive blood cultures for gram positive cocci. Abnormal bilateral cerebral sulci or leptomeninges on MRI 04/19/2021. Chronic microhemorrhages. EXAM: CT ANGIOGRAPHY HEAD AND NECK TECHNIQUE: Multidetector CT imaging of the  head and neck was performed using the standard protocol during bolus administration of intravenous contrast. Multiplanar CT image reconstructions and MIPs were obtained to evaluate the vascular anatomy. Carotid stenosis measurements (when applicable) are obtained utilizing NASCET criteria, using the distal internal carotid diameter as the denominator. CONTRAST:  60mL OMNIPAQUE IOHEXOL 350 MG/ML SOLN COMPARISON:  Brain MRI 04/19/2021.  Head CT 04/18/2021 and earlier. FINDINGS: CT HEAD Brain: Cerebral volume is stable and within normal limits. No midline shift, ventriculomegaly, mass effect, evidence of mass lesion, intracranial hemorrhage or evidence of cortically based acute infarction. Gray-white matter differentiation is within normal limits throughout the brain. No hyperdense hemorrhage or CT correlation to the abnormal sulcal appearance on MR FLAIR imaging. Calvarium and skull base: No acute osseous abnormality identified. Paranasal sinuses: Visualized paranasal sinuses and mastoids are clear. Orbits: No acute orbit or scalp soft tissue finding. CTA NECK Skeleton: No acute osseous abnormality identified. Advanced chronic C5-C6 disc and endplate degeneration. Upper chest: Negative lung apices and superior mediastinum. Other neck: Negative. Aortic arch: 3 vessel arch configuration.  No arch atherosclerosis. Right carotid system: Negative brachiocephalic artery. Tortuous proximal right CCA. Minimal calcified plaque at the right ICA bulb. Mildly tortuous distal right ICA without stenosis. Left carotid system: Negative aside from mild tortuosity and mild left ICA bulb calcified plaque similar to the right side. Vertebral arteries: Normal proximal right subclavian artery and right vertebral artery origin. Right V1 segment obscured by paravertebral venous contrast. But otherwise the cervical right vertebral artery appears patent and within normal limits to the skull base. Normal proximal left subclavian artery and left  vertebral artery origin. Mildly tortuous left vertebral is patent without plaque or stenosis to the skull base. CTA HEAD Suboptimal intracranial arterial contrast bolus. Posterior circulation: Fairly codominant distal vertebral arteries are patent to the basilar without plaque or stenosis. Both a ICAs appear dominant. Patent basilar artery without stenosis. Normal SCA and PCA origins. Both posterior communicating arteries are present. Bilateral PCA branches are within normal limits. Anterior circulation: Both ICA siphons are patent. No siphon plaque or stenosis. Normal ophthalmic and posterior communicating artery origins. Patent carotid termini, MCA and ACA origins. Anterior communicating artery and bilateral ACA branches are within normal limits allowing for the contrast bolus. Left MCA M1 segment and bifurcation are patent. Right MCA M1 segment and bifurcation are patent. Bilateral MCA branches are within normal limits. Venous sinuses: Patent, with venous predominant intracranial contrast timing. Anatomic variants: None. Review of the MIP images confirms the above findings IMPRESSION: 1. Stable and unremarkable CT appearance of the brain - raising the likelihood of meningitis as etiology of the widespread subarachnoid abnormality on MRI 2 days ago. 2. Minor atherosclerosis at both ICA origins but otherwise negative CTA Head and Neck,  no arterial stenosis identified. 3. 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 Electronically Signed   By: Odessa Fleming M.D.   On: 04/21/2021 08:47   CT Head Wo Contrast  Result Date: 04/17/2021 CLINICAL DATA:  Headache and dizziness. EXAM: CT HEAD WITHOUT CONTRAST TECHNIQUE: Contiguous axial images were obtained from the base of the skull through the vertex without intravenous contrast. COMPARISON:  None. FINDINGS: Brain: No evidence of acute infarction, hemorrhage, hydrocephalus, extra-axial collection or mass lesion/mass effect. Vascular: No hyperdense vessel or  unexpected calcification. Skull: Normal. Negative for fracture or focal lesion. Sinuses/Orbits: No acute finding. Other: None. IMPRESSION: 1. Normal noncontrast head CT. Electronically Signed   By: Obie Dredge M.D.   On: 04/17/2021 11:57   MR ANGIO HEAD WO CONTRAST  Result Date: 04/17/2021 CLINICAL DATA:  Headache and dizziness with neck pain EXAM: MRI HEAD WITHOUT AND WITH CONTRAST MRA HEAD WITHOUT CONTRAST MRA NECK WITHOUT AND WITH CONTRAST TECHNIQUE: Multiplanar, multiecho pulse sequences of the brain and surrounding structures were obtained without and with intravenous contrast. Angiographic images of the Circle of Willis were obtained using MRA technique without intravenous contrast. Angiographic images of the neck were obtained using MRA technique without and with intravenous contrast. Carotid stenosis measurements (when applicable) are obtained utilizing NASCET criteria, using the distal internal carotid diameter as the denominator. CONTRAST:  10mL GADAVIST GADOBUTROL 1 MMOL/ML IV SOLN COMPARISON:  None. FINDINGS: MRI HEAD FINDINGS Brain: No acute infarct, mass effect or extra-axial collection. Multiple chronic microhemorrhages in a predominantly peripheral distribution. There is multifocal hyperintense T2-weighted signal within the white matter. Parenchymal volume and CSF spaces are normal. The midline structures are normal. There is no abnormal contrast enhancement. Vascular: Major flow voids are preserved. Skull and upper cervical spine: Normal calvarium and skull base. Visualized upper cervical spine and soft tissues are normal. Sinuses/Orbits:No paranasal sinus fluid levels or advanced mucosal thickening. No mastoid or middle ear effusion. Normal orbits. MRA HEAD FINDINGS POSTERIOR CIRCULATION: --Vertebral arteries: Normal --Inferior cerebellar arteries: Normal. --Basilar artery: Normal. --Superior cerebellar arteries: Normal. --Posterior cerebral arteries: Normal. ANTERIOR CIRCULATION:  --Intracranial internal carotid arteries: Normal. --Anterior cerebral arteries (ACA): Normal. --Middle cerebral arteries (MCA): Normal. ANATOMIC VARIANTS: Both posterior communicating arteries are patent MRA NECK FINDINGS No vertebral or carotid artery abnormality. IMPRESSION: 1. No acute intracranial abnormality. 2. Normal MRA of the head and neck. 3. Multifocal chronic microhemorrhages in a predominantly peripheral distribution. Electronically Signed   By: Deatra Robinson M.D.   On: 04/17/2021 22:09   MR Angiogram Neck W or Wo Contrast  Result Date: 04/17/2021 CLINICAL DATA:  Headache and dizziness with neck pain EXAM: MRI HEAD WITHOUT AND WITH CONTRAST MRA HEAD WITHOUT CONTRAST MRA NECK WITHOUT AND WITH CONTRAST TECHNIQUE: Multiplanar, multiecho pulse sequences of the brain and surrounding structures were obtained without and with intravenous contrast. Angiographic images of the Circle of Willis were obtained using MRA technique without intravenous contrast. Angiographic images of the neck were obtained using MRA technique without and with intravenous contrast. Carotid stenosis measurements (when applicable) are obtained utilizing NASCET criteria, using the distal internal carotid diameter as the denominator. CONTRAST:  10mL GADAVIST GADOBUTROL 1 MMOL/ML IV SOLN COMPARISON:  None. FINDINGS: MRI HEAD FINDINGS Brain: No acute infarct, mass effect or extra-axial collection. Multiple chronic microhemorrhages in a predominantly peripheral distribution. There is multifocal hyperintense T2-weighted signal within the white matter. Parenchymal volume and CSF spaces are normal. The midline structures are normal. There is no abnormal contrast enhancement. Vascular: Major flow voids are preserved.  Skull and upper cervical spine: Normal calvarium and skull base. Visualized upper cervical spine and soft tissues are normal. Sinuses/Orbits:No paranasal sinus fluid levels or advanced mucosal thickening. No mastoid or middle ear  effusion. Normal orbits. MRA HEAD FINDINGS POSTERIOR CIRCULATION: --Vertebral arteries: Normal --Inferior cerebellar arteries: Normal. --Basilar artery: Normal. --Superior cerebellar arteries: Normal. --Posterior cerebral arteries: Normal. ANTERIOR CIRCULATION: --Intracranial internal carotid arteries: Normal. --Anterior cerebral arteries (ACA): Normal. --Middle cerebral arteries (MCA): Normal. ANATOMIC VARIANTS: Both posterior communicating arteries are patent MRA NECK FINDINGS No vertebral or carotid artery abnormality. IMPRESSION: 1. No acute intracranial abnormality. 2. Normal MRA of the head and neck. 3. Multifocal chronic microhemorrhages in a predominantly peripheral distribution. Electronically Signed   By: Deatra Robinson M.D.   On: 04/17/2021 22:09   MR BRAIN W WO CONTRAST  Result Date: 04/19/2021 CLINICAL DATA:  Seizure, left-sided weakness EXAM: MRI HEAD WITHOUT AND WITH CONTRAST TECHNIQUE: Multiplanar, multiecho pulse sequences of the brain and surrounding structures were obtained without and with intravenous contrast. CONTRAST:  10mL GADAVIST GADOBUTROL 1 MMOL/ML IV SOLN COMPARISON:  04/17/2021 FINDINGS: Brain: There is no acute infarction or intracranial hemorrhage. There is no intracranial mass, mass effect, or edema. There is no hydrocephalus or extra-axial fluid collection. Ventricles and sulci are normal in size and configuration. There are numerous foci of susceptibility involving the cerebral white matter, central gray nuclei, brainstem, and cerebellum. Patchy foci of T2 hyperintensity in the supratentorial white matter nonspecific but may reflect mild chronic microvascular ischemic changes. There is abnormal sulcal T2 FLAIR hyperintensity. No abnormal leptomeningeal enhancement. No abnormal parenchymal enhancement. Vascular: Major vessel flow voids at the skull base are preserved. Skull and upper cervical spine: Normal marrow signal is preserved. Sinuses/Orbits: Paranasal sinuses are aerated.  Bilateral lens replacements. Other: Sella is unremarkable.  Mastoid air cells are clear. 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 Although there is no corresponding abnormal diffusion or enhancement, consider meningitis given positive blood cultures. Unchanged numerous foci of susceptibility. May reflect chronic microhemorrhages secondary to chronic hypertension. Embolic material is also a consideration. Correlate with echocardiography. Electronically Signed   By: Guadlupe Spanish M.D.   On: 04/19/2021 15:08   MR Brain W and Wo Contrast  Result Date: 04/17/2021 CLINICAL DATA:  Headache and dizziness with neck pain EXAM: MRI HEAD WITHOUT AND WITH CONTRAST MRA HEAD WITHOUT CONTRAST MRA NECK WITHOUT AND WITH CONTRAST TECHNIQUE: Multiplanar, multiecho pulse sequences of the brain and surrounding structures were obtained without and with intravenous contrast. Angiographic images of the Circle of Willis were obtained using MRA technique without intravenous contrast. Angiographic images of the neck were obtained using MRA technique without and with intravenous contrast. Carotid stenosis measurements (when applicable) are obtained utilizing NASCET criteria, using the distal internal carotid diameter as the denominator. CONTRAST:  10mL GADAVIST GADOBUTROL 1 MMOL/ML IV SOLN COMPARISON:  None. FINDINGS: MRI HEAD FINDINGS Brain: No acute infarct, mass effect or extra-axial collection. Multiple chronic microhemorrhages in a predominantly peripheral distribution. There is multifocal hyperintense T2-weighted signal within the white matter. Parenchymal volume and CSF spaces are normal. The midline structures are normal. There is no abnormal contrast enhancement. Vascular: Major flow voids are preserved. Skull and upper cervical spine: Normal calvarium and skull base. Visualized upper cervical spine and soft tissues are normal. Sinuses/Orbits:No paranasal sinus fluid levels or  advanced mucosal thickening. No mastoid or middle ear effusion. Normal orbits. MRA HEAD FINDINGS POSTERIOR CIRCULATION: --Vertebral arteries: Normal --Inferior cerebellar arteries: Normal. --Basilar artery: Normal. --Superior cerebellar arteries:  Normal. --Posterior cerebral arteries: Normal. ANTERIOR CIRCULATION: --Intracranial internal carotid arteries: Normal. --Anterior cerebral arteries (ACA): Normal. --Middle cerebral arteries (MCA): Normal. ANATOMIC VARIANTS: Both posterior communicating arteries are patent MRA NECK FINDINGS No vertebral or carotid artery abnormality. IMPRESSION: 1. No acute intracranial abnormality. 2. Normal MRA of the head and neck. 3. Multifocal chronic microhemorrhages in a predominantly peripheral distribution. Electronically Signed   By: Deatra RobinsonKevin  Herman M.D.   On: 04/17/2021 22:09   EEG adult  Result Date: 04/22/2021 Charlsie QuestYadav, Priyanka O, MD     04/22/2021  3:20 PM Patient Name: Lynnae JanuaryRodney Oberry MRN: [id number] Epilepsy Attending: Charlsie QuestPriyanka O Yadav Referring Physician/Provider: Dr Brooke DareSrishti Bhagat Date: 04/22/2021 Duration: 30 mins Patient history: 50 yo man with new onset GTC in setting of admission for hypertensive urgency with severe headache and nausea after abrupt onset of severe headache during spell of vomiting last week. EEG to evaluate for seizure. Level of alertness: Awake, asleep AEDs during EEG study: LEV Technical aspects: This EEG study was done with scalp electrodes positioned according to the 10-20 International system of electrode placement. Electrical activity was acquired at a sampling rate of 500Hz  and reviewed with a high frequency filter of 70Hz  and a low frequency filter of 1Hz . EEG data were recorded continuously and digitally stored. Description: The posterior dominant rhythm consists of 9 Hz activity of moderate voltage (25-35 uV) seen predominantly in posterior head regions, symmetric and reactive to eye opening and eye closing. Sleep was characterized by vertex waves,  sleep spindles (12 to 14 Hz), maximal frontocentral region. EEG showed continuous right frontal 3-6hz  theta-delta slowing. There was also intermittent generalized 3 to 6 Hz theta-delta slowing. Physiologic photic driving was not seen during photic stimulation.  Hyperventilation was not performed.   ABNORMALITY - Continuous slow, right frontal region - Intermittent slow, generalized IMPRESSION: This study is suggestive of cortical dysfunction in right frontal region likely secondary to underlying structural abnormality. There is also mild diffuse encephalopathy, nonspecific etiology. No seizures or epileptiform discharges were seen throughout the recording. Charlsie Questriyanka O Yadav   ECHOCARDIOGRAM COMPLETE  Result Date: 04/20/2021    ECHOCARDIOGRAM REPORT   [facility]   [facility] Date of Exam: 04/19/2021 Medical Rec #:  [id number]      Height:       71.0 in Accession #:    [id number]     Weight:       263.0 lb Date of Birth:  10-Apr-1971      BSA:          2.369 m Patient Age:    49 years       BP:           141/91 mmHg Patient Gender: M              HR:           64 bpm. Exam Location:  [facility] Procedure: 2D Echo Indications:     Bacteremia R78.81  History:         Patient has no prior history of Echocardiogram examinations.  Sonographer:     Overton Mamikeshia Johnson RDCS Referring Phys:  [id number] CARMEN Knox SalivaL GONZALEZ Diagnosing Phys: Julien Nordmannimothy Gollan MD IMPRESSIONS  1. Left ventricular ejection fraction, by estimation, is 60 to 65%. The left ventricle has normal function. The left ventricle has no regional wall motion abnormalities. There is moderate left ventricular hypertrophy. Left ventricular diastolic parameters are consistent with Grade I diastolic dysfunction (impaired relaxation).  2. Right ventricular systolic function is normal. The right  ventricular size is normal. Tricuspid regurgitation signal is inadequate for assessing PA pressure.  3. No valve vegetation noted. FINDINGS  Left Ventricle: Left ventricular ejection  fraction, by estimation, is 60 to 65%. The left ventricle has normal function. The left ventricle has no regional wall motion abnormalities. The left ventricular internal cavity size was normal in size. There is  moderate left ventricular hypertrophy. Left ventricular diastolic parameters are consistent with Grade I diastolic dysfunction (impaired relaxation). Right Ventricle: The right ventricular size is normal. No increase in right ventricular wall thickness. Right ventricular systolic function is normal. Tricuspid regurgitation signal is inadequate for assessing PA pressure. Left Atrium: Left atrial size was normal in size. Right Atrium: Right atrial size was normal in size. Pericardium: There is no evidence of pericardial effusion. Mitral Valve: The mitral valve is normal in structure. No evidence of mitral valve regurgitation. No evidence of mitral valve stenosis. Tricuspid Valve: The tricuspid valve is normal in structure. Tricuspid valve regurgitation is not demonstrated. No evidence of tricuspid stenosis. Aortic Valve: The aortic valve is normal in structure. Aortic valve regurgitation is not visualized. No aortic stenosis is present. Aortic valve peak gradient measures 4.0 mmHg. Pulmonic Valve: The pulmonic valve was normal in structure. Pulmonic valve regurgitation is not visualized. No evidence of pulmonic stenosis. Aorta: The aortic root is normal in size and structure. Venous: The pulmonary veins were not well visualized. The inferior vena cava is normal in size with greater than 50% respiratory variability, suggesting right atrial pressure of 3 mmHg. IAS/Shunts: No atrial level shunt detected by color flow Doppler.  LEFT VENTRICLE PLAX 2D LVIDd:         4.95 cm  Diastology LVIDs:         3.42 cm  LV e' medial:    4.13 cm/s LV PW:         1.68 cm  LV E/e' medial:  13.2 LV IVS:        1.78 cm  LV e' lateral:   4.24 cm/s LVOT diam:     1.90 cm  LV E/e' lateral: 12.8 LV SV:         49 LV SV Index:   21  LVOT Area:     2.84 cm  RIGHT VENTRICLE RV Basal diam:  2.32 cm RV S prime:     14.30 cm/s TAPSE (M-mode): 1.6 cm LEFT ATRIUM           Index       RIGHT ATRIUM          Index LA diam:      3.50 cm 1.48 cm/m  RA Area:     9.24 cm LA Vol (A2C): 17.0 ml 7.18 ml/m  RA Volume:   17.00 ml 7.18 ml/m LA Vol (A4C): 36.9 ml 15.58 ml/m  AORTIC VALVE                PULMONIC VALVE AV Area (Vmax): 2.65 cm    PV Vmax:       0.92 m/s AV Vmax:        100.00 cm/s PV Peak grad:  3.4 mmHg AV Peak Grad:   4.0 mmHg LVOT Vmax:      93.30 cm/s LVOT Vmean:     59.200 cm/s LVOT VTI:       0.174 m  AORTA Ao Root diam: 2.90 cm Ao Asc diam:  3.20 cm MITRAL VALVE MV Area (PHT): 2.69 cm    SHUNTS MV Decel Time: 282 msec  Systemic VTI:  0.17 m MV E velocity: 54.40 cm/s  Systemic Diam: 1.90 cm MV A velocity: 85.30 cm/s MV E/A ratio:  0.64 Julien Nordmann MD Electronically signed by Julien Nordmann MD Signature Date/Time: 04/20/2021/9:34:35 AM    Final    CT HEAD CODE STROKE WO CONTRAST`  Result Date: 04/18/2021 CLINICAL DATA:  Code stroke.  Seizure EXAM: CT HEAD WITHOUT CONTRAST TECHNIQUE: Contiguous axial images were obtained from the base of the skull through the vertex without intravenous contrast. COMPARISON:  None. FINDINGS: Brain: There is no mass, hemorrhage or extra-axial collection. The size and configuration of the ventricles and extra-axial CSF spaces are normal. The brain parenchyma is normal, without evidence of acute or chronic infarction. Vascular: No abnormal hyperdensity of the major intracranial arteries or dural venous sinuses. No intracranial atherosclerosis. Skull: The visualized skull base, calvarium and extracranial soft tissues are normal. Sinuses/Orbits: No fluid levels or advanced mucosal thickening of the visualized paranasal sinuses. No mastoid or middle ear effusion. The orbits are normal. ASPECTS [facility] Stroke Program Early CT Score) - Ganglionic level infarction (caudate, lentiform nuclei, internal  capsule, insula, M1-M3 cortex): 7 - Supraganglionic infarction (M4-M6 cortex): 3 Total score (0-10 with 10 being normal): 10 IMPRESSION: 1. Normal head CT. 2. ASPECTS is 10. These results were called by telephone at the time of interpretation on 04/18/2021 at 10:53 pm to provider BRITTON RUST-CHESTER , who verbally acknowledged these results. Electronically Signed   By: Deatra Robinson M.D.   On: 04/18/2021 22:53   CT CHEST ABDOMEN PELVIS WO CONTRAST  Result Date: 04/17/2021 CLINICAL DATA:  Elevated white count. Vomiting, dizziness and fatigue. EXAM: CT CHEST, ABDOMEN AND PELVIS WITHOUT CONTRAST TECHNIQUE: Multidetector CT imaging of the chest, abdomen and pelvis was performed following the standard protocol without IV contrast. COMPARISON:  None. FINDINGS: CT CHEST FINDINGS Cardiovascular: Heart size is normal. No visible coronary artery calcification or aortic atherosclerotic calcification. Mediastinum/Nodes: No mass or lymphadenopathy. Lungs/Pleura: No pleural effusion. No pulmonary infiltrate or collapse. No mass or nodule. No emphysema or interstitial lung disease. Musculoskeletal: Bridging osteophytes throughout the mid to lower thoracic region suggesting diffuse idiopathic skeletal hyperostosis. CT ABDOMEN PELVIS FINDINGS Hepatobiliary: Normal Pancreas: Normal Spleen: Normal Adrenals/Urinary Tract: Adrenal glands are normal. Kidneys are normal without contrast. Bladder is normal. Stomach/Bowel: Stomach and small intestine are normal. Normal appendix. No abnormal colon finding. Vascular/Lymphatic: Minimal aortic atherosclerosis. No aneurysm. IVC is normal. No adenopathy. Reproductive: Normal Other: No free fluid or air. Musculoskeletal: Ordinary lower lumbar degenerative changes. IMPRESSION: No acute finding to explain the clinical presentation. Normal evaluation of the chest without contrast. No acutely significant abdominal or pelvic finding. Aortic atherosclerosis, mild. Bridging osteophytes in the thoracic  region suggesting diffuse idiopathic skeletal hyperostosis. Lower lumbar degenerative disc disease. Electronically Signed   By: Paulina Fusi M.D.   On: 04/17/2021 14:42      Labs: BNP (last 3 results) No results for input(s): BNP in the last 8760 hours. Basic Metabolic Panel: Recent Labs  Lab 04/19/21 0416 04/20/21 0355 04/21/21 0445 04/22/21 0514 04/23/21 0444  NA 133* 133* 134* 135 133*  K 4.1 3.7 3.8 4.0 4.1  CL 99 104 103 106 102  CO2 GLUCOSE 218* 178* 191* 186* 256*  BUN 36* 33* 26* 19 20  CREATININE 2.57* 2.11* 2.01* 1.78* 1.87*  CALCIUM 8.5* 8.2* 8.4* 8.3* 8.5*  MG 2.3 2.2 2.2 2.2 1.9  PHOS  --   --   --   --  3.0   Liver Function Tests: No results for input(s): AST, ALT, ALKPHOS, BILITOT, PROT, ALBUMIN in the last 168 hours. No results for input(s): LIPASE, AMYLASE in the last 168 hours. No results for input(s): AMMONIA in the last 168 hours. CBC: Recent Labs  Lab 04/19/21 0416 04/20/21 0355 04/21/21 0445  WBC 12.3* 8.7 9.6  NEUTROABS 9.1* 5.9 7.3  HGB 13.8 12.6* 13.2  HCT 39.3 37.0* 38.1*  MCV 90.1 92.3 91.1  PLT 215 205 213   Cardiac Enzymes: No results for input(s): CKTOTAL, CKMB, CKMBINDEX, TROPONINI in the last 168 hours. BNP: Invalid input(s): POCBNP CBG: Recent Labs  Lab 04/24/21 0719 04/24/21 1121 04/24/21 1606 04/24/21 2101 04/25/21 0811  GLUCAP 190* 290* 286* 246* 190*   D-Dimer No results for input(s): DDIMER in the last 72 hours. Hgb A1c No results for input(s): HGBA1C in the last 72 hours. Lipid Profile No results for input(s): CHOL, HDL, LDLCALC, TRIG, CHOLHDL, LDLDIRECT in the last 72 hours. Thyroid function studies No results for input(s): TSH, T4TOTAL, T3FREE, THYROIDAB in the last 72 hours.  Invalid input(s): FREET3 Anemia work up No results for input(s): VITAMINB12, FOLATE, FERRITIN, TIBC, IRON, RETICCTPCT in the last 72 hours. Urinalysis    Component Value Date/Time   COLORURINE YELLOW (A) 04/19/2021  1018   APPEARANCEUR HAZY (A) 04/19/2021 1018   LABSPEC 1.017 04/19/2021 1018   PHURINE 5.0 04/19/2021 1018   GLUCOSEU >=500 (A) 04/19/2021 1018   HGBUR SMALL (A) 04/19/2021 1018   BILIRUBINUR NEGATIVE 04/19/2021 1018   KETONESUR 5 (A) 04/19/2021 1018   PROTEINUR 100 (A) 04/19/2021 1018   NITRITE NEGATIVE 04/19/2021 1018   LEUKOCYTESUR NEGATIVE 04/19/2021 1018   Sepsis Labs Invalid input(s): PROCALCITONIN,  WBC,  LACTICIDVEN Microbiology Recent Results (from the past 240 hour(s))  Resp Panel by RT-PCR (Flu A&B, Covid) Nasopharyngeal Swab     Status: None   Collection Time: 04/17/21  9:40 AM   Specimen: Nasopharyngeal Swab; Nasopharyngeal(NP) swabs in vial transport medium  Result Value Ref Range Status   SARS Coronavirus 2 by RT PCR NEGATIVE NEGATIVE Final    Comment: (NOTE) SARS-CoV-2 target nucleic acids are NOT DETECTED.  The SARS-CoV-2 RNA is generally detectable in upper respiratory specimens during the acute phase of infection. The lowest concentration of SARS-CoV-2 viral copies this assay can detect is 138 copies/mL. A negative result does not preclude SARS-Cov-2 infection and should not be used as the sole basis for treatment or other patient management decisions. A negative result may occur with  improper specimen collection/handling, submission of specimen other than nasopharyngeal swab, presence of viral mutation(s) within the areas targeted by this assay, and inadequate number of viral copies(<138 copies/mL). A negative result must be combined with clinical observations, patient history, and epidemiological information. The expected result is Negative.  Fact Sheet for Patients:  BloggerCourse.com  Fact Sheet for Healthcare Providers:  SeriousBroker.it  This test is no t yet approved or cleared by the [place] FDA and  has been authorized for detection and/or diagnosis of SARS-CoV-2 by FDA under an Emergency  Use Authorization (EUA). This EUA will remain  in effect (meaning this test can be used) for the duration of the COVID-19 declaration under Section 564(b)(1) of the Act, 21 U.S.C.section 360bbb-3(b)(1), unless the authorization is terminated  or revoked sooner.       Influenza A by PCR NEGATIVE NEGATIVE Final   Influenza B by PCR NEGATIVE NEGATIVE Final    Comment: (NOTE) The Xpert Xpress SARS-CoV-2/FLU/RSV plus assay is  intended as an aid in the diagnosis of influenza from Nasopharyngeal swab specimens and should not be used as a sole basis for treatment. Nasal washings and aspirates are unacceptable for Xpert Xpress SARS-CoV-2/FLU/RSV testing.  Fact Sheet for Patients: BloggerCourse.com  Fact Sheet for Healthcare Providers: SeriousBroker.it  This test is not yet approved or cleared by the [place] FDA and has been authorized for detection and/or diagnosis of SARS-CoV-2 by FDA under an Emergency Use Authorization (EUA). This EUA will remain in effect (meaning this test can be used) for the duration of the COVID-19 declaration under Section 564(b)(1) of the Act, 21 U.S.C. section 360bbb-3(b)(1), unless the authorization is terminated or revoked.  Performed at [facility], [address] [place], [place] [address]   CULTURE, BLOOD (ROUTINE X 2) w Reflex to ID Panel     Status: Abnormal (Preliminary result)   Collection Time: 04/17/21  4:34 PM   Specimen: BLOOD  Result Value Ref Range Status   Specimen Description   Final    BLOOD RIGHT ANTECUBITAL Performed at [facility], [address], [place], [place] [address]    Special Requests   Final    BOTTLES DRAWN AEROBIC AND ANAEROBIC Blood Culture results may not be optimal due to an inadequate volume of blood received in culture bottles Performed at [facility], [address], [place], [place] [address]    Culture  Setup Time   Final     GRAM POSITIVE COCCI ANAEROBIC BOTTLE ONLY CRITICAL VALUE NOTED.  VALUE IS CONSISTENT WITH PREVIOUSLY REPORTED AND CALLED VALUE. Performed at [facility], [address] [place], [place] [address]    Culture STAPHYLOCOCCUS CAPITIS (A)  Final   Report Status PENDING  Incomplete  CULTURE, BLOOD (ROUTINE X 2) w Reflex to ID Panel     Status: Abnormal (Preliminary result)   Collection Time: 04/17/21  4:38 PM   Specimen: BLOOD  Result Value Ref Range Status   Specimen Description   Final    BLOOD LEFT ANTECUBITAL Performed at [facility], [address], [place], [place] [address]    Special Requests   Final    BOTTLES DRAWN AEROBIC AND ANAEROBIC Blood Culture results may not be optimal due to an inadequate volume of blood received in culture bottles Performed at [facility], [address] [place], [place] [address]    Culture  Setup Time   Final    GRAM POSITIVE COCCI IN BOTH AEROBIC AND ANAEROBIC BOTTLES CRITICAL RESULT CALLED TO, READ BACK BY AND VERIFIED WITH: SUSAN WATSON @1518  ON 07.01.22.SH    Culture (A)  Final    STAPHYLOCOCCUS CAPITIS Sent to [facility] for further susceptibility testing. STAPHYLOCOCCUS EPIDERMIDIS THE SIGNIFICANCE OF ISOLATING THIS ORGANISM FROM A SINGLE SET OF BLOOD CULTURES WHEN MULTIPLE SETS ARE DRAWN IS UNCERTAIN. PLEASE NOTIFY THE MICROBIOLOGY DEPARTMENT WITHIN ONE WEEK IF SPECIATION AND SENSITIVITIES ARE REQUIRED. Performed at [facility], [address], [place], [place] [address]    Report Status PENDING  Incomplete  Blood Culture ID Panel (Reflexed)     Status: Abnormal   Collection Time: 04/17/21  4:38 PM  Result Value Ref Range Status   Enterococcus faecalis NOT DETECTED NOT DETECTED Final   Enterococcus Faecium NOT DETECTED NOT DETECTED Final   Listeria monocytogenes NOT DETECTED NOT DETECTED Final   Staphylococcus species DETECTED (A) NOT DETECTED Final    Comment: CRITICAL RESULT CALLED TO, READ BACK  BY AND VERIFIED WITH: SUSAN WATSON @1518   ON 07.01.22.SH    Staphylococcus aureus (BCID) NOT DETECTED NOT DETECTED Final   Staphylococcus epidermidis DETECTED (A) NOT DETECTED Final    Comment: CRITICAL RESULT CALLED TO, READ BACK BY AND VERIFIED WITH: SUSAN WATSON @1518  ON 07.01.22.SH    Staphylococcus lugdunensis NOT DETECTED NOT DETECTED Final   Streptococcus species NOT DETECTED NOT DETECTED Final   Streptococcus agalactiae NOT DETECTED NOT DETECTED Final   Streptococcus pneumoniae NOT DETECTED NOT DETECTED Final   Streptococcus pyogenes NOT DETECTED NOT DETECTED Final   A.calcoaceticus-baumannii NOT DETECTED NOT DETECTED Final   Bacteroides fragilis NOT DETECTED NOT DETECTED Final   Enterobacterales NOT DETECTED NOT DETECTED Final   Enterobacter cloacae complex NOT DETECTED NOT DETECTED Final   Escherichia coli NOT DETECTED NOT DETECTED Final   Klebsiella aerogenes NOT DETECTED NOT DETECTED Final   Klebsiella oxytoca NOT DETECTED NOT DETECTED Final   Klebsiella pneumoniae NOT DETECTED NOT DETECTED Final   Proteus species NOT DETECTED NOT DETECTED Final   Salmonella species NOT DETECTED NOT DETECTED Final   Serratia marcescens NOT DETECTED NOT DETECTED Final   Haemophilus influenzae NOT DETECTED NOT DETECTED Final   Neisseria meningitidis NOT DETECTED NOT DETECTED Final   Pseudomonas aeruginosa NOT DETECTED NOT DETECTED Final   Stenotrophomonas maltophilia NOT DETECTED NOT DETECTED Final   Candida albicans NOT DETECTED NOT DETECTED Final   Candida auris NOT DETECTED NOT DETECTED Final   Candida glabrata NOT DETECTED NOT DETECTED Final   Candida krusei NOT DETECTED NOT DETECTED Final   Candida parapsilosis NOT DETECTED NOT DETECTED Final   Candida tropicalis NOT DETECTED NOT DETECTED Final   Cryptococcus neoformans/gattii NOT DETECTED NOT DETECTED Final   Methicillin resistance mecA/C NOT DETECTED NOT DETECTED Final    Comment: Performed at [facility], [address] [place], [place] [address]  MRSA Next Gen by PCR, Nasal     Status: None   Collection Time: 04/18/21  1:37 AM   Specimen: Nasal Mucosa; Nasal Swab  Result Value Ref Range Status   MRSA by PCR Next Gen NOT DETECTED NOT DETECTED Final    Comment: (NOTE) The GeneXpert MRSA Assay (FDA approved for NASAL specimens only), is one component of a comprehensive MRSA colonization surveillance program. It is not intended to diagnose MRSA infection nor to guide or monitor treatment for MRSA infections. Test performance is not FDA approved in patients less than 87 years old. Performed at [facility], [address] [place], [place] [address]   Urine Culture     Status: None   Collection Time: 04/19/21 11:00 AM   Specimen: Urine, Random  Result Value Ref Range Status   Specimen Description   Final    URINE, RANDOM Performed at [facility], [address], [place], [place] [address]    Special Requests   Final    NONE Performed at [facility], [address], [place], [place] [address]    Culture   Final    NO GROWTH Performed at [facility], [address], [place], [place] [address]    Report Status 04/20/2021 FINAL  Final  CULTURE, BLOOD (ROUTINE X 2) w Reflex to ID Panel     Status: None   Collection Time: 04/20/21 12:01 AM   Specimen: BLOOD  Result Value Ref Range Status   Specimen Description BLOOD BLOOD RIGHT FOREARM  Final   Special Requests   Final    BOTTLES DRAWN AEROBIC AND ANAEROBIC Blood Culture adequate volume   Culture  Final    NO GROWTH 5 DAYS Performed at [facility], [address] [place], [place] [address]    Report Status 04/25/2021 FINAL  Final  CULTURE, BLOOD (ROUTINE X 2) w Reflex to ID Panel     Status: None   Collection Time: 04/20/21 12:02 AM   Specimen: BLOOD  Result Value Ref Range Status   Specimen Description BLOOD BLOOD RIGHT HAND  Final   Special Requests   Final    BOTTLES DRAWN AEROBIC AND  ANAEROBIC Blood Culture adequate volume   Culture   Final    NO GROWTH 5 DAYS Performed at [facility], [address] [place], [place] [address]    Report Status 04/25/2021 FINAL  Final     Total time spend on discharging this patient, including the last patient exam, discussing the hospital stay, instructions for ongoing care as it relates to all pertinent caregivers, as well as preparing the medical discharge records, prescriptions, and/or referrals as applicable, is 60 minutes.    Darlin Priestly, MD  [facility] 04/25/2021, 10:29 AM

## 2021-04-25 NOTE — Plan of Care (Signed)

## 2021-04-25 NOTE — Progress Notes (Signed)
Inpatient Diabetes Program Recommendations  AACE/ADA: New Consensus Statement on Inpatient Glycemic Control (2015)  Target Ranges:  Prepandial:   less than 140 mg/dL      Peak postprandial:   less than 180 mg/dL (1-2 hours)      Critically ill patients:  140 - 180 mg/dL   Lab Results  Component Value Date   GLUCAP 190 (H) 04/25/2021   HGBA1C 12.0 (H) 04/18/2021    Review of Glycemic Control Results for Clayton [name], Clayton [name] (MRN [id number]) as of 04/24/2021 11:32  Ref. Range 04/23/2021 07:30 04/23/2021 11:28 04/23/2021 16:23 04/23/2021 21:06 04/24/2021 07:19 04/24/2021 11:21  Glucose-Capillary Latest Ref Range: 70 - 99 mg/dL 726 (H) 203 (H) 559 (H) 260 (H) 190 (H) 290 (H)   Inpatient Diabetes Program Recommendations:    Glucose trends elevated still today - increase Lantus to 20 units - Add Novolog 4 units tid if eats 50%    70/30 dose 15 units bid equivalent to 20 units of basal and 9 units of meal coverage for all meals  Needs on discharge: Novolog 70/30 Mix Flexpen (Order # [id number]) Insulin Pen Needles 32 gauge x 59mm ([id number])  Christena Deem RN, MSN, BC-ADM Inpatient Diabetes Coordinator Team Pager [phone] (8a-5p)

## 2021-04-25 NOTE — Plan of Care (Signed)
  Problem: Education: Goal: Knowledge of General Education information will improve Description: Including pain rating scale, medication(s)/side effects and non-pharmacologic comfort measures 04/25/2021 0322 by Sheria Lang, RN Outcome: Progressing 04/25/2021 0320 by Sheria Lang, RN Outcome: Progressing   Problem: Health Behavior/Discharge Planning: Goal: Ability to manage health-related needs will improve 04/25/2021 0322 by Sheria Lang, RN Outcome: Progressing 04/25/2021 0320 by Sheria Lang, RN Outcome: Progressing   Problem: Clinical Measurements: Goal: Ability to maintain clinical measurements within normal limits will improve 04/25/2021 0322 by Sheria Lang, RN Outcome: Progressing 04/25/2021 0320 by Sheria Lang, RN Outcome: Progressing Goal: Will remain free from infection 04/25/2021 0322 by Sheria Lang, RN Outcome: Progressing 04/25/2021 0320 by Sheria Lang, RN Outcome: Progressing Goal: Diagnostic test results will improve 04/25/2021 0322 by Sheria Lang, RN Outcome: Progressing 04/25/2021 0320 by Sheria Lang, RN Outcome: Progressing Goal: Respiratory complications will improve 04/25/2021 0322 by Sheria Lang, RN Outcome: Progressing 04/25/2021 0320 by Sheria Lang, RN Outcome: Progressing Goal: Cardiovascular complication will be avoided 04/25/2021 0322 by Sheria Lang, RN Outcome: Progressing 04/25/2021 0320 by Sheria Lang, RN Outcome: Progressing   Problem: Activity: Goal: Risk for activity intolerance will decrease 04/25/2021 0322 by Sheria Lang, RN Outcome: Progressing 04/25/2021 0320 by Sheria Lang, RN Outcome: Progressing   Problem: Nutrition: Goal: Adequate nutrition will be maintained 04/25/2021 0322 by Sheria Lang, RN Outcome: Progressing 04/25/2021 0320 by Sheria Lang, RN Outcome: Progressing   Problem: Coping: Goal: Level of anxiety will decrease 04/25/2021 0322 by Sheria Lang, RN Outcome: Progressing 04/25/2021 0320 by Sheria Lang, RN Outcome: Progressing   Problem:  Elimination: Goal: Will not experience complications related to bowel motility 04/25/2021 0322 by Sheria Lang, RN Outcome: Progressing 04/25/2021 0320 by Sheria Lang, RN Outcome: Progressing Goal: Will not experience complications related to urinary retention 04/25/2021 0322 by Sheria Lang, RN Outcome: Progressing 04/25/2021 0320 by Sheria Lang, RN Outcome: Progressing   Problem: Pain Managment: Goal: General experience of comfort will improve 04/25/2021 0322 by Sheria Lang, RN Outcome: Progressing 04/25/2021 0320 by Sheria Lang, RN Outcome: Progressing   Problem: Safety: Goal: Ability to remain free from injury will improve 04/25/2021 0322 by Sheria Lang, RN Outcome: Progressing 04/25/2021 0320 by Sheria Lang, RN Outcome: Progressing   Problem: Skin Integrity: Goal: Risk for impaired skin integrity will decrease 04/25/2021 0322 by Sheria Lang, RN Outcome: Progressing 04/25/2021 0320 by Sheria Lang, RN Outcome: Progressing   Problem: Safety: Goal: Non-violent Restraint(s) 04/25/2021 0322 by Sheria Lang, RN Outcome: Progressing 04/25/2021 0320 by Sheria Lang, RN Outcome: Progressing

## 2021-04-26 LAB — SUSCEPTIBILITY, AER + ANAEROB: Source of Sample: 8680

## 2021-04-26 LAB — SUSCEPTIBILITY RESULT

## 2021-04-28 LAB — DRUG SCREEN 10 W/CONF, SERUM
Amphetamines, IA: NEGATIVE ng/mL
Barbiturates, IA: NEGATIVE ug/mL
Benzodiazepines, IA: NEGATIVE ng/mL
Cocaine & Metabolite, IA: NEGATIVE ng/mL
Methadone, IA: NEGATIVE ng/mL
Opiates, IA: NEGATIVE ng/mL
Oxycodones, IA: NEGATIVE ng/mL
Phencyclidine, IA: NEGATIVE ng/mL
Propoxyphene, IA: NEGATIVE ng/mL
THC(Marijuana) Metabolite, IA: NEGATIVE ng/mL

## 2021-04-28 LAB — BENZODIAZEPINES,MS,WB/SP RFX
7-Aminoclonazepam: NEGATIVE ng/mL
Alprazolam: NEGATIVE ng/mL
Benzodiazepines Confirm: NEGATIVE
Chlordiazepoxide: NEGATIVE
Clonazepam: NEGATIVE ng/mL
Desalkylflurazepam: NEGATIVE ng/mL
Desmethylchlordiazepoxide: NEGATIVE
Desmethyldiazepam: NEGATIVE ng/mL
Diazepam: NEGATIVE ng/mL
Flurazepam: NEGATIVE ng/mL
Lorazepam: NEGATIVE ng/mL
Midazolam: NEGATIVE ng/mL
Oxazepam: NEGATIVE ng/mL
Temazepam: NEGATIVE ng/mL
Triazolam: NEGATIVE ng/mL

## 2021-04-28 LAB — OXYCODONES,MS,WB/SP RFX
Oxycocone: NEGATIVE ng/mL
Oxycodones Confirmation: NEGATIVE
Oxymorphone: NEGATIVE ng/mL

## 2021-05-06 ENCOUNTER — Ambulatory Visit: Payer: 59 | Attending: Infectious Diseases | Admitting: Infectious Diseases

## 2021-05-06 ENCOUNTER — Other Ambulatory Visit
Admission: RE | Admit: 2021-05-06 | Discharge: 2021-05-06 | Disposition: A | Discharge status: Home / Self Care | Payer: 59 | Attending: Infectious Diseases | Admitting: Infectious Diseases

## 2021-05-06 ENCOUNTER — Other Ambulatory Visit: Payer: Self-pay

## 2021-05-06 VITALS — BP 125/79 | HR 66 | Temp 97.9°F | Resp 16 | Ht 71.0 in | Wt 263.0 lb

## 2021-05-06 DIAGNOSIS — N183 Chronic kidney disease, stage 3 unspecified: Secondary | ICD-10-CM | POA: Insufficient documentation

## 2021-05-06 DIAGNOSIS — Z794 Long term (current) use of insulin: Secondary | ICD-10-CM | POA: Diagnosis not present

## 2021-05-06 DIAGNOSIS — Z8249 Family history of ischemic heart disease and other diseases of the circulatory system: Secondary | ICD-10-CM | POA: Diagnosis not present

## 2021-05-06 DIAGNOSIS — R7881 Bacteremia: Secondary | ICD-10-CM | POA: Insufficient documentation

## 2021-05-06 DIAGNOSIS — Z79899 Other long term (current) drug therapy: Secondary | ICD-10-CM | POA: Insufficient documentation

## 2021-05-06 DIAGNOSIS — I129 Hypertensive chronic kidney disease with stage 1 through stage 4 chronic kidney disease, or unspecified chronic kidney disease: Secondary | ICD-10-CM | POA: Insufficient documentation

## 2021-05-06 DIAGNOSIS — E1122 Type 2 diabetes mellitus with diabetic chronic kidney disease: Secondary | ICD-10-CM | POA: Diagnosis not present

## 2021-05-06 DIAGNOSIS — Z9114 Patient's other noncompliance with medication regimen: Secondary | ICD-10-CM | POA: Diagnosis not present

## 2021-05-06 DIAGNOSIS — Z7984 Long term (current) use of oral hypoglycemic drugs: Secondary | ICD-10-CM | POA: Diagnosis not present

## 2021-05-06 LAB — CULTURE, BLOOD (ROUTINE X 2)

## 2021-05-06 NOTE — Patient Instructions (Signed)
You are here for follow up after recent hospitla stay- will do blood culture today to make sure there is no bacteria

## 2021-05-06 NOTE — Progress Notes (Signed)
NAME: Clayton [name]  DOB: Apr 18, 1971  MRN: [id number]  Date/Time: 05/06/2021 11:17 AM  Subjective:  Pt here for follow up with his wife  ? Clayton [name] is a 50 y.o. male with a history of Htn, DM , non compliant to meds for a long time was in the hospital between 04/17/21-04/25/21 for N/V/headache/neck pain. His BP on admission was 247/115. MRI brain showed microhemorrhages secondary to the hypertensive crisis. He also developed hypertensive encephalopathy /PRES and was  seen by neurologist Pt had blood culture from left arm positive for staph epidermidis and staph hominis  both  coag neg skin bacteria and one bottle on the rt staph hominis- He was initially treated with antibiotics but it was deemed a contaminant with repeat blood culture being negative. 2 d echo was okay- This was thought to be not contributing to his medical condition. He is here today just for follow up- He has no fever, chills , headache. Has some nausea He is still very tired Taking his meds regularly- saw Dr.Potter neurologist on 05/01/21  Past Medical History:  Diagnosis Date   CKD (chronic kidney disease), stage III (HCC)    Hypertension    Type II diabetes mellitus with renal manifestations (HCC)     Past Surgical History:  Procedure Laterality Date   CATARACT EXTRACTION      Social History   Socioeconomic History   Marital status: Married    Spouse name: Not on file   Number of children: Not on file   Years of education: Not on file   Highest education level: Not on file  Occupational History   Not on file  Tobacco Use   Smoking status: Never   Smokeless tobacco: Never  Substance and Sexual Activity   Alcohol use: Yes   Drug use: Not Currently   Sexual activity: Yes  Other Topics Concern   Not on file  Social History Narrative   Not on file   Social Determinants of Health   Financial Resource Strain: Not on file  Food Insecurity: Not on file  Transportation Needs: Not on file  Physical  Activity: Not on file  Stress: Not on file  Social Connections: Not on file  Intimate Partner Violence: Not on file    Family History  Problem Relation Age of Onset   Stroke Mother    Hypertension Mother    Hypertension Father    No Known Allergies I? Current Outpatient Medications  Medication Sig Dispense Refill   amLODipine (NORVASC) 10 MG tablet Take 1 tablet (10 mg total) by mouth daily. 90 tablet 0   carvedilol (COREG) 25 MG tablet Take 1 tablet (25 mg total) by mouth 2 (two) times daily. 180 tablet 0   chlorthalidone (HYGROTON) 25 MG tablet Take 1 tablet (25 mg total) by mouth daily. 90 tablet 0   Cholecalciferol 50 MCG (2000 UT) TABS Take by mouth.     hydrALAZINE (APRESOLINE) 50 MG tablet Take 1.5 tablets (75 mg total) by mouth 3 (three) times daily. 405 tablet 0   Insulin Lispro Prot & Lispro (HUMALOG 75/25 MIX) (75-25) 100 UNIT/ML Kwikpen Inject 15 Units into the skin 2 (two) times daily. Can give equivalent if covered by insurance.  Pls provide needles. 27 mL 0   levETIRAcetam (KEPPRA) 1000 MG tablet Take 1 tablet (1,000 mg total) by mouth 2 (two) times daily. 60 tablet 2   losartan (COZAAR) 100 MG tablet Take 1 tablet (100 mg total) by mouth daily. 90 tablet 0  metFORMIN (GLUMETZA) 1000 MG (MOD) 24 hr tablet Take 1 tablet (1,000 mg total) by mouth 2 (two) times daily. Increase from 500 mg. 180 tablet 0   tadalafil (CIALIS) 20 MG tablet Take by mouth.     Vitamin D, Ergocalciferol, (DRISDOL) 1.25 MG (50000 UNIT) CAPS capsule Take 50,000 Units by mouth once a week.     No current facility-administered medications for this visit.     Abtx:  Anti-infectives (From admission, onward)    None       REVIEW OF SYSTEMS:  Const: negative fever, negative chills, negative weight loss Eyes: negative diplopia or visual changes, negative eye pain ENT: negative coryza, negative sore throat Resp: negative cough, hemoptysis, dyspnea Cards: negative for chest pain, palpitations,  lower extremity edema GU: negative for frequency, dysuria and hematuria GI: as above Skin: negative for rash and pruritus Heme: negative for easy bruising and gum/nose bleeding MS: fatigue Neurolo:negative for headaches, dizziness, vertigo, memory problems  Psych: negative for feelings of anxiety, depression  Endocrine:  diabetes Allergy/Immunology- negative for any medication or food allergies ? Objective:  VITALS:  BP 125/79   Pulse 66   Temp 97.9 F (36.6 C)   Resp 16   Ht 5\' 11"  (1.803 m)   Wt 263 lb (119.3 kg)   SpO2 95%   BMI 36.68 kg/m  PHYSICAL EXAM:  General: Alert, cooperative, no distress, appears stated age.  Head: Normocephalic, without obvious abnormality, atraumatic. Eyes: Conjunctivae clear, anicteric sclerae.  ENT Nares normal. No drainage or sinus tenderness. Lips, mucosa, and tongue normal. No Thrush Neck: Supple, symmetrical, no adenopathy, thyroid: non tender no carotid bruit and no JVD. Back: No CVA tenderness. Lungs: Clear to auscultation bilaterally. No Wheezing or Rhonchi. No rales. Heart: Regular rate and rhythm, no murmur, rub or gallop. Abdomen: Soft, non-tender,not distended. Bowel sounds normal. No masses Extremities: atraumatic, no cyanosis. No edema. No clubbing Skin: No rashes or lesions. Or bruising Lymph: Cervical, supraclavicular normal. Neurologic: left foot drop Pertinent Labs Lab Results CBC    Component Value Date/Time   WBC 9.6 04/21/2021 0445   RBC 4.18 (L) 04/21/2021 0445   HGB 13.2 04/21/2021 0445   HCT 38.1 (L) 04/21/2021 0445   PLT 213 04/21/2021 0445   MCV 91.1 04/21/2021 0445   MCH 31.6 04/21/2021 0445   MCHC 34.6 04/21/2021 0445   RDW 11.9 04/21/2021 0445   LYMPHSABS 1.3 04/21/2021 0445   MONOABS 0.7 04/21/2021 0445   EOSABS 0.2 04/21/2021 0445   BASOSABS 0.0 04/21/2021 0445    CMP Latest Ref Rng & Units 04/23/2021 04/22/2021 04/21/2021  Glucose 70 - 99 mg/dL 06/22/2021) 237(S) 283(T)  BUN 6 - 20 mg/dL 20 19 517(O)   Creatinine 0.61 - 1.24 mg/dL 16(W) 7.37(T) 0.62(I)  Sodium 135 - 145 mmol/L 133(L) 135 134(L)  Potassium 3.5 - 5.1 mmol/L 4.1 4.0 3.8  Chloride 98 - 111 mmol/L 102 106 103  CO2 22 - 32 mmol/L 27 25 22   Calcium 8.9 - 10.3 mg/dL 9.48(N) 8.3(L) 8.4(L)    Impression/Recommendation ? ?Recent Hospitalization for hypertensive emergency causing encephalopathy, microhemorrhages and PRES like features-Much improved on treatment  Bacteremia- 2 types of coag neg staph- wa deemed a contaminant Will repeat blood culture today to make sure that he does not have any occult infection. If cultures neg , he need not follow up with me  DM- was non compliant but now back on meds  Discussed the management with the patient and his wife ? ________________________________________________

## 2021-05-08 LAB — CULTURE, BLOOD (ROUTINE X 2)

## 2021-05-12 LAB — CULTURE, BLOOD (SINGLE)
Culture: NO GROWTH
Culture: NO GROWTH
Special Requests: ADEQUATE
Special Requests: ADEQUATE

## 2021-05-13 ENCOUNTER — Telehealth: Payer: Self-pay

## 2021-05-13 NOTE — Telephone Encounter (Signed)
Advised urine culture negative. Patient verbally expressed understanding of results.

## 2022-12-18 ENCOUNTER — Ambulatory Visit
Admission: EM | Admit: 2022-12-18 | Discharge: 2022-12-18 | Disposition: A | Discharge status: Home / Self Care | Payer: 59

## 2022-12-18 DIAGNOSIS — H1031 Unspecified acute conjunctivitis, right eye: Secondary | ICD-10-CM | POA: Diagnosis not present

## 2022-12-18 MED ORDER — KETOROLAC TROMETHAMINE 0.5 % OP SOLN: 1.0000 [drp] | Freq: Four times a day (QID) | OPHTHALMIC | 0 refills | Status: DC

## 2022-12-18 MED ORDER — MOXIFLOXACIN HCL 0.5 % OP SOLN: 1.0000 [drp] | Freq: Three times a day (TID) | OPHTHALMIC | 0 refills | Status: AC

## 2022-12-18 NOTE — ED Provider Notes (Signed)
Clayton [name]    CSN: [id number] Arrival date & time: 12/18/22  0809      History   Chief Complaint Chief Complaint  Patient presents with   Conjunctivitis    HPI Clayton [name] is a 52 y.o. male.    Conjunctivitis    Presents to urgent care with report of right eye redness and irritation with feeling of "sand" with clear drainage throughout the day starting today.  Patient states that the right eye was "matted shut" with "yellow mucus" when he awoke this morning.  He has been washing out his eye and using OTC eyedrops.  Past Medical History:  Diagnosis Date   CKD (chronic kidney disease), stage III ([place])    Hypertension    Type II diabetes mellitus with renal manifestations [facility])     Patient Active Problem List   Diagnosis Date Noted   Bacteremia due to Gram-positive bacteria 04/19/2021   Hyponatremia 04/19/2021   Acute urinary retention 04/19/2021   Hypertensive encephalopathy 04/19/2021   Seizure ([place])    Hypertensive emergency 04/17/2021   Hypertensive urgency 04/17/2021   Nausea & vomiting [id number]   Acute metabolic encephalopathy [id number]   CKD (chronic kidney disease), stage IIIb 04/17/2021   Type II diabetes mellitus with renal manifestations ([place]) 04/17/2021   Leukocytosis 04/17/2021   Elevated lactic acid level 04/17/2021   Elevated troponin 04/17/2021    Past Surgical History:  Procedure Laterality Date   CATARACT EXTRACTION         Home Medications    Prior to Admission medications   Medication Sig Start Date End Date Taking? Authorizing Provider  amLODipine (NORVASC) 10 MG tablet Take 1 tablet (10 mg total) by mouth daily. 04/23/21 12/18/22 Yes Enzo Bi, MD  carvedilol (COREG) 25 MG tablet Take 1 tablet (25 mg total) by mouth 2 (two) times daily. 04/23/21 12/18/22 Yes Enzo Bi, MD  empagliflozin (JARDIANCE) 10 MG TABS tablet Take 1 tablet by mouth daily. 12/04/22  Yes [provider]  hydrALAZINE (APRESOLINE) 50 MG tablet  Take 1.5 tablets (75 mg total) by mouth 3 (three) times daily. 04/25/21 12/18/22 Yes Enzo Bi, MD  Insulin Lispro Prot & Lispro (HUMALOG 75/25 MIX) (75-25) 100 UNIT/ML Kwikpen Inject 15 Units into the skin 2 (two) times daily. Can give equivalent if covered by insurance.  Pls provide needles. 04/25/21 12/18/22 Yes Enzo Bi, MD  losartan (COZAAR) 100 MG tablet Take 1 tablet (100 mg total) by mouth daily. 04/23/21 12/18/22 Yes Enzo Bi, MD  metFORMIN (GLUMETZA) 1000 MG (MOD) 24 hr tablet Take 1 tablet (1,000 mg total) by mouth 2 (two) times daily. Increase from 500 mg. 04/23/21 12/18/22 Yes Enzo Bi, MD  moxifloxacin (VIGAMOX) 0.5 % ophthalmic solution Place 1 drop into the right eye 3 (three) times daily for 7 days. May also use in Left eye if symptoms develop. 12/18/22 12/25/22 Yes Kenric Ginger, Annie Main, FNP  chlorthalidone (HYGROTON) 25 MG tablet Take 1 tablet (25 mg total) by mouth daily. 04/25/21 07/24/21  Enzo Bi, MD  Cholecalciferol 50 MCG (2000 UT) TABS Take by mouth. 09/01/20   [provider]  levETIRAcetam (KEPPRA) 1000 MG tablet Take 1 tablet (1,000 mg total) by mouth 2 (two) times daily. 04/23/21 07/22/21  Enzo Bi, MD  tadalafil (CIALIS) 20 MG tablet Take by mouth. 10/31/20   [provider]  Vitamin D, Ergocalciferol, (DRISDOL) 1.25 MG (50000 UNIT) CAPS capsule Take 50,000 Units by mouth once a week. 10/31/20   [provider]  insulin aspart protamine -  aspart (NOVOLOG 70/30 MIX) (70-30) 100 UNIT/ML FlexPen Inject 0.15 mLs (15 Units total) into the skin 2 (two) times daily. 04/25/21 04/25/21  Enzo Bi, MD    Family History Family History  Problem Relation Age of Onset   Stroke Mother    Hypertension Mother    Hypertension Father     Social History Social History   Tobacco Use   Smoking status: Never   Smokeless tobacco: Never  Substance Use Topics   Alcohol use: Yes   Drug use: Not Currently     Allergies   Patient has no known allergies.   Review of Systems Review  of Systems   Physical Exam Triage Vital Signs ED Triage Vitals  Enc Vitals Group     BP 12/18/22 0828 137/82     Pulse Rate 12/18/22 0828 72     Resp 12/18/22 0828 18     Temp 12/18/22 0828 98.3 F (36.8 C)     Temp Source 12/18/22 0828 Oral     SpO2 12/18/22 0828 94 %     Weight --      Height --      Head Circumference --      Peak Flow --      Pain Score 12/18/22 0832 6     Pain Loc --      Pain Edu? --      Excl. in [place]? --    No data found.  Updated Vital Signs BP 137/82 (BP Location: Left Arm)   Pulse 72   Temp 98.3 F (36.8 C) (Oral)   Resp 18   SpO2 94%   Visual Acuity Right Eye Distance:   Left Eye Distance:   Bilateral Distance:    Right Eye Near:   Left Eye Near:    Bilateral Near:     Physical Exam Vitals reviewed.  Constitutional:      Appearance: Normal appearance.  Eyes:     General:        Right eye: Discharge present.        Left eye: No discharge.     Conjunctiva/sclera:     Right eye: Right conjunctiva is injected.     Left eye: Left conjunctiva is injected.     Comments: Clear, watery drainage from the right eye.  Both eyes appear injected.  Skin:    General: Skin is warm and dry.  Neurological:     General: No focal deficit present.     Mental Status: He is alert and oriented to person, place, and time.  Psychiatric:        Mood and Affect: Mood normal.        Behavior: Behavior normal.      UC Treatments / Results  Labs (all labs ordered are listed, but only abnormal results are displayed) Labs Reviewed - No data to display  EKG   Radiology No results found.  Procedures Procedures (including critical care time)  Medications Ordered in UC Medications - No data to display  Initial Impression / Assessment and Plan / UC Course  I have reviewed the triage vital signs and the nursing notes.  Pertinent labs & imaging results that were available during my care of the patient were reviewed by me and considered in my  medical decision making (see chart for details).   Clear, watery drainage from the right eye.  Both eyes appear injected.  Clear watery drainage and description of sand in the eye is suspicious of viral etiology.  However patient's description of yellow exudate is descriptive of possible bacterial.  Will prescribe ketorolac for acute irritation as well as moxifloxacin drops to cover possible bacterial involvement.  Final Clinical Impressions(s) / UC Diagnoses   Final diagnoses:  Acute conjunctivitis of right eye, unspecified acute conjunctivitis type     Discharge Instructions      Follow up here or with your primary care provider if your symptoms are worsening or not improving with treatment.      ED Prescriptions     Medication Sig Dispense Auth. Provider   moxifloxacin (VIGAMOX) 0.5 % ophthalmic solution Place 1 drop into the right eye 3 (three) times daily for 7 days. May also use in Left eye if symptoms develop. 3 mL Romy Ipock, FNP      PDMP not reviewed this encounter.   Rose Phi, [place] 12/18/22 [phone]

## 2022-12-18 NOTE — ED Triage Notes (Signed)
Pt states his right eye feels irritated like sand is in there with clear drainage. Did wake up with right eye matted shut with yellow mucus. Has been washing out eye and using and eye drops but still having irritation and pain.

## 2022-12-18 NOTE — Discharge Instructions (Addendum)
Follow up here or with your primary care provider if your symptoms are worsening or not improving with treatment.          

## 2023-09-27 ENCOUNTER — Other Ambulatory Visit: Payer: Self-pay

## 2023-09-27 ENCOUNTER — Observation Stay
Admission: EM | Admit: 2023-09-27 | Discharge: 2023-09-28 | Disposition: A | Discharge status: Home / Self Care | Payer: 59 | Attending: Student | Admitting: Student

## 2023-09-27 ENCOUNTER — Emergency Department: Payer: 59

## 2023-09-27 DIAGNOSIS — I129 Hypertensive chronic kidney disease with stage 1 through stage 4 chronic kidney disease, or unspecified chronic kidney disease: Secondary | ICD-10-CM | POA: Diagnosis not present

## 2023-09-27 DIAGNOSIS — E669 Obesity, unspecified: Secondary | ICD-10-CM | POA: Diagnosis not present

## 2023-09-27 DIAGNOSIS — E559 Vitamin D deficiency, unspecified: Secondary | ICD-10-CM | POA: Insufficient documentation

## 2023-09-27 DIAGNOSIS — J9601 Acute respiratory failure with hypoxia: Secondary | ICD-10-CM

## 2023-09-27 DIAGNOSIS — E1165 Type 2 diabetes mellitus with hyperglycemia: Secondary | ICD-10-CM | POA: Diagnosis not present

## 2023-09-27 DIAGNOSIS — Z6833 Body mass index (BMI) 33.0-33.9, adult: Secondary | ICD-10-CM | POA: Insufficient documentation

## 2023-09-27 DIAGNOSIS — Z794 Long term (current) use of insulin: Secondary | ICD-10-CM | POA: Insufficient documentation

## 2023-09-27 DIAGNOSIS — J209 Acute bronchitis, unspecified: Secondary | ICD-10-CM | POA: Diagnosis not present

## 2023-09-27 DIAGNOSIS — N1832 Chronic kidney disease, stage 3b: Secondary | ICD-10-CM

## 2023-09-27 DIAGNOSIS — N179 Acute kidney failure, unspecified: Secondary | ICD-10-CM | POA: Diagnosis not present

## 2023-09-27 DIAGNOSIS — Z79899 Other long term (current) drug therapy: Secondary | ICD-10-CM | POA: Diagnosis not present

## 2023-09-27 DIAGNOSIS — Z1152 Encounter for screening for COVID-19: Secondary | ICD-10-CM | POA: Diagnosis not present

## 2023-09-27 DIAGNOSIS — Z7984 Long term (current) use of oral hypoglycemic drugs: Secondary | ICD-10-CM | POA: Diagnosis not present

## 2023-09-27 DIAGNOSIS — E1122 Type 2 diabetes mellitus with diabetic chronic kidney disease: Secondary | ICD-10-CM | POA: Diagnosis not present

## 2023-09-27 DIAGNOSIS — N1831 Chronic kidney disease, stage 3a: Secondary | ICD-10-CM | POA: Diagnosis not present

## 2023-09-27 DIAGNOSIS — R0602 Shortness of breath: Secondary | ICD-10-CM | POA: Diagnosis present

## 2023-09-27 DIAGNOSIS — J45901 Unspecified asthma with (acute) exacerbation: Secondary | ICD-10-CM | POA: Diagnosis not present

## 2023-09-27 DIAGNOSIS — I10 Essential (primary) hypertension: Secondary | ICD-10-CM | POA: Insufficient documentation

## 2023-09-27 HISTORY — DX: Unspecified asthma, uncomplicated: J45.909

## 2023-09-27 LAB — CBC WITH DIFFERENTIAL/PLATELET
Abs Immature Granulocytes: 0.1 10*3/uL — ABNORMAL HIGH (ref 0.00–0.07)
Basophils Absolute: 0.1 10*3/uL (ref 0.0–0.1)
Basophils Relative: 0 %
Eosinophils Absolute: 0.7 10*3/uL — ABNORMAL HIGH (ref 0.0–0.5)
Eosinophils Relative: 4 %
HCT: 45.6 % (ref 39.0–52.0)
Hemoglobin: 15.9 g/dL (ref 13.0–17.0)
Immature Granulocytes: 1 %
Lymphocytes Relative: 8 %
Lymphs Abs: 1.5 10*3/uL (ref 0.7–4.0)
MCH: 32.1 pg (ref 26.0–34.0)
MCHC: 34.9 g/dL (ref 30.0–36.0)
MCV: 92.1 fL (ref 80.0–100.0)
Monocytes Absolute: 0.7 10*3/uL (ref 0.1–1.0)
Monocytes Relative: 4 %
Neutro Abs: 16.2 10*3/uL — ABNORMAL HIGH (ref 1.7–7.7)
Neutrophils Relative %: 83 %
Platelets: 282 10*3/uL (ref 150–400)
RBC: 4.95 MIL/uL (ref 4.22–5.81)
RDW: 12.6 % (ref 11.5–15.5)
WBC: 19.3 10*3/uL — ABNORMAL HIGH (ref 4.0–10.5)
nRBC: 0 % (ref 0.0–0.2)

## 2023-09-27 LAB — CBG MONITORING, ED
Glucose-Capillary: 240 mg/dL — ABNORMAL HIGH (ref 70–99)
Glucose-Capillary: 229 mg/dL — ABNORMAL HIGH (ref 70–99)

## 2023-09-27 LAB — RESP PANEL BY RT-PCR (RSV, FLU A&B, COVID)  RVPGX2
Influenza A by PCR: NEGATIVE
Influenza B by PCR: NEGATIVE
Resp Syncytial Virus by PCR: NEGATIVE
SARS Coronavirus 2 by RT PCR: NEGATIVE

## 2023-09-27 LAB — BASIC METABOLIC PANEL
Anion gap: 10 (ref 5–15)
BUN: 43 mg/dL — ABNORMAL HIGH (ref 6–20)
CO2: 25 mmol/L (ref 22–32)
Calcium: 9.5 mg/dL (ref 8.9–10.3)
Chloride: 98 mmol/L (ref 98–111)
Creatinine, Ser: 3 mg/dL — ABNORMAL HIGH (ref 0.61–1.24)
GFR, Estimated: 24 mL/min — ABNORMAL LOW (ref >60)
Glucose, Bld: 277 mg/dL — ABNORMAL HIGH (ref 70–99)
Potassium: 4.2 mmol/L (ref 3.5–5.1)
Sodium: 133 mmol/L — ABNORMAL LOW (ref 135–145)

## 2023-09-27 LAB — TROPONIN I (HIGH SENSITIVITY)
Troponin I (High Sensitivity): 11 ng/L (ref <18)
Troponin I (High Sensitivity): 18 ng/L — ABNORMAL HIGH (ref <18)

## 2023-09-27 MED ORDER — PREDNISONE 20 MG PO TABS: 40.0000 mg | ORAL_TABLET | Freq: Every day | ORAL | Status: DC

## 2023-09-27 MED ORDER — ALBUTEROL SULFATE (2.5 MG/3ML) 0.083% IN NEBU
2.5000 mg | INHALATION_SOLUTION | Freq: Once | RESPIRATORY_TRACT | Status: AC
  Administered 2023-09-27: 2.5 mg via RESPIRATORY_TRACT
  Filled 2023-09-27: qty 3

## 2023-09-27 MED ORDER — IPRATROPIUM-ALBUTEROL 0.5-2.5 (3) MG/3ML IN SOLN
3.0000 mL | Freq: Four times a day (QID) | RESPIRATORY_TRACT | Status: DC
  Administered 2023-09-28 (×2): 3 mL via RESPIRATORY_TRACT
  Filled 2023-09-27 (×2): qty 3

## 2023-09-27 MED ORDER — METHYLPREDNISOLONE SODIUM SUCC 40 MG IJ SOLR: 40.0000 mg | Freq: Two times a day (BID) | INTRAMUSCULAR | Status: DC

## 2023-09-27 MED ORDER — ALBUTEROL SULFATE (2.5 MG/3ML) 0.083% IN NEBU: 2.5000 mg | INHALATION_SOLUTION | RESPIRATORY_TRACT | Status: DC | PRN

## 2023-09-27 MED ORDER — LOSARTAN POTASSIUM 50 MG PO TABS
100.0000 mg | ORAL_TABLET | Freq: Every day | ORAL | Status: DC
  Administered 2023-09-28: 100 mg via ORAL
  Filled 2023-09-27: qty 2

## 2023-09-27 MED ORDER — SODIUM CHLORIDE 0.9 % IV SOLN
500.0000 mg | INTRAVENOUS | Status: DC
  Administered 2023-09-27: 500 mg via INTRAVENOUS
  Filled 2023-09-27: qty 5

## 2023-09-27 MED ORDER — ONDANSETRON HCL 4 MG PO TABS: 4.0000 mg | ORAL_TABLET | Freq: Four times a day (QID) | ORAL | Status: DC | PRN

## 2023-09-27 MED ORDER — AMLODIPINE BESYLATE 5 MG PO TABS
10.0000 mg | ORAL_TABLET | Freq: Every day | ORAL | Status: DC
  Administered 2023-09-28: 10 mg via ORAL
  Filled 2023-09-27: qty 2

## 2023-09-27 MED ORDER — MAGNESIUM SULFATE 2 GM/50ML IV SOLN
2.0000 g | Freq: Once | INTRAVENOUS | Status: AC
  Administered 2023-09-27: 2 g via INTRAVENOUS
  Filled 2023-09-27: qty 50

## 2023-09-27 MED ORDER — ACETAMINOPHEN 325 MG PO TABS: 650.0000 mg | ORAL_TABLET | Freq: Four times a day (QID) | ORAL | Status: DC | PRN

## 2023-09-27 MED ORDER — ENOXAPARIN SODIUM 60 MG/0.6ML IJ SOSY
60.0000 mg | PREFILLED_SYRINGE | INTRAMUSCULAR | Status: DC
  Administered 2023-09-28: 60 mg via SUBCUTANEOUS
  Filled 2023-09-27: qty 0.6

## 2023-09-27 MED ORDER — METHYLPREDNISOLONE SODIUM SUCC 40 MG IJ SOLR
40.0000 mg | Freq: Two times a day (BID) | INTRAMUSCULAR | Status: DC
  Administered 2023-09-28: 40 mg via INTRAVENOUS
  Filled 2023-09-27: qty 1

## 2023-09-27 MED ORDER — IPRATROPIUM-ALBUTEROL 0.5-2.5 (3) MG/3ML IN SOLN
9.0000 mL | Freq: Once | RESPIRATORY_TRACT | Status: AC
  Administered 2023-09-27: 9 mL via RESPIRATORY_TRACT
  Filled 2023-09-27: qty 9

## 2023-09-27 MED ORDER — INSULIN ASPART 100 UNIT/ML IJ SOLN
0.0000 [IU] | Freq: Every day | INTRAMUSCULAR | Status: DC
  Administered 2023-09-27: 2 [IU] via SUBCUTANEOUS
  Filled 2023-09-27: qty 1

## 2023-09-27 MED ORDER — ACETAMINOPHEN 650 MG RE SUPP: 650.0000 mg | Freq: Four times a day (QID) | RECTAL | Status: DC | PRN

## 2023-09-27 MED ORDER — ONDANSETRON HCL 4 MG/2ML IJ SOLN: 4.0000 mg | Freq: Four times a day (QID) | INTRAMUSCULAR | Status: DC | PRN

## 2023-09-27 MED ORDER — GUAIFENESIN ER 600 MG PO TB12
600.0000 mg | ORAL_TABLET | Freq: Two times a day (BID) | ORAL | Status: DC
  Administered 2023-09-27 – 2023-09-28 (×2): 600 mg via ORAL
  Filled 2023-09-27 (×2): qty 1

## 2023-09-27 MED ORDER — HYDROCODONE-ACETAMINOPHEN 5-325 MG PO TABS: 1.0000 | ORAL_TABLET | ORAL | Status: DC | PRN

## 2023-09-27 MED ORDER — SODIUM CHLORIDE 0.9 % IV BOLUS
1000.0000 mL | Freq: Once | INTRAVENOUS | Status: AC
  Administered 2023-09-27: 1000 mL via INTRAVENOUS

## 2023-09-27 MED ORDER — METHYLPREDNISOLONE SODIUM SUCC 125 MG IJ SOLR
125.0000 mg | Freq: Once | INTRAMUSCULAR | Status: AC
Start: 2023-09-27 — End: 2023-09-27
  Administered 2023-09-27: 125 mg via INTRAVENOUS
  Filled 2023-09-27: qty 2

## 2023-09-27 MED ORDER — INSULIN ASPART 100 UNIT/ML IJ SOLN
0.0000 [IU] | Freq: Three times a day (TID) | INTRAMUSCULAR | Status: DC
  Administered 2023-09-28 (×2): 8 [IU] via SUBCUTANEOUS
  Filled 2023-09-27 (×2): qty 1

## 2023-09-27 NOTE — Progress Notes (Signed)
PHARMACIST - PHYSICIAN COMMUNICATION  CONCERNING:  Enoxaparin (Lovenox) for DVT Prophylaxis    RECOMMENDATION: Patient was prescribed enoxaprin 40mg  q24 hours for VTE prophylaxis.   Filed Weights   09/27/23 2003  Weight: 117.9 kg (260 lb)    Body mass index is 33.38 kg/m.  Estimated Creatinine Clearance: 39.8 mL/min (A) (by C-G formula based on SCr of 3 mg/dL (H)).   Based on [facility] policy patient is candidate for enoxaparin 0.5mg /kg TBW SQ every 24 hours based on BMI being >30.  DESCRIPTION: Pharmacy has adjusted enoxaparin dose per [facility] policy.  Patient is now receiving enoxaparin 0.5 mg/kg every 24 hours   Otelia Sergeant, PharmD, [facility] 09/27/2023 11:05 PM

## 2023-09-27 NOTE — Assessment & Plan Note (Addendum)
History of hypertensive encephalopathy with seizures Blood pressure only slightly elevated to 159/99 Resume home amlodipine and losartan

## 2023-09-27 NOTE — Assessment & Plan Note (Addendum)
Suspect progression of CKD Monitor renal function and avoid nephrotoxins Giving a fluid bolus

## 2023-09-27 NOTE — ED Provider Notes (Signed)
[facility] Provider Note    Event Date/Time   First MD Initiated Contact with Patient 09/27/23 1957     (approximate)   History   Chief Complaint Shortness of Breath   HPI  Clayton [name] is a 52 y.o. male with past medical history of hypertension, diabetes, and CKD who presents to the ED complaining of shortness of breath.  Patient reports that he has been increasingly short of breath over the course of the day with symptoms getting acutely worse about 30 minutes prior to arrival.  He describes a nonproductive cough and denies any pain in his chest, has not had any fevers.  He describes symptoms as similar to prior asthma exacerbations, has been using his home albuterol inhaler with partial relief.  His albuterol inhaler ran out earlier this evening, prompting his ED visit.     Physical Exam   Triage Vital Signs: ED Triage Vitals  Encounter Vitals Group     BP 09/27/23 2001 (!) 159/99     Systolic BP Percentile --      Diastolic BP Percentile --      Pulse Rate 09/27/23 2001 85     Resp 09/27/23 2001 (!) 28     Temp --      Temp src --      SpO2 09/27/23 2001 (!) 85 %     Weight 09/27/23 2003 260 lb (117.9 kg)     Height 09/27/23 2003 6\' 2"  (1.88 m)     Head Circumference --      Peak Flow --      Pain Score 09/27/23 2003 0     Pain Loc --      Pain Education --      Exclude from Growth Chart --     Most recent vital signs: Vitals:   09/27/23 2003 09/27/23 2023  BP:    Pulse:    Resp:    SpO2: 92% 97%    Constitutional: Alert and oriented. Eyes: Conjunctivae are normal. Head: Atraumatic. Nose: No congestion/rhinnorhea. Mouth/Throat: Mucous membranes are moist.  Cardiovascular: Normal rate, regular rhythm. Grossly normal heart sounds.  2+ radial pulses bilaterally. Respiratory: Tachypneic with increased respiratory effort, inspiratory and expiratory wheezing throughout. Gastrointestinal: Soft and nontender. No  distention. Musculoskeletal: No lower extremity tenderness nor edema.  Neurologic:  Normal speech and language. No gross focal neurologic deficits are appreciated.    ED Results / Procedures / Treatments   Labs (all labs ordered are listed, but only abnormal results are displayed) Labs Reviewed  CBC WITH DIFFERENTIAL/PLATELET - Abnormal; Notable for the following components:      Result Value   WBC 19.3 (*)    Neutro Abs 16.2 (*)    Eosinophils Absolute 0.7 (*)    Abs Immature Granulocytes 0.10 (*)    All other components within normal limits  BASIC METABOLIC PANEL - Abnormal; Notable for the following components:   Sodium 133 (*)    Glucose, Bld 277 (*)    BUN 43 (*)    Creatinine, Ser 3.00 (*)    GFR, Estimated 24 (*)    All other components within normal limits  CBG MONITORING, ED - Abnormal; Notable for the following components:   Glucose-Capillary 240 (*)    All other components within normal limits  RESP PANEL BY RT-PCR (RSV, FLU A&B, COVID)  [id number]  TROPONIN I (HIGH SENSITIVITY)  TROPONIN I (HIGH SENSITIVITY)     EKG  ED ECG REPORT I, Leonette Most  Maryna Yeagle, the attending physician, personally viewed and interpreted this ECG.   Date: 09/27/2023  EKG Time: 19:59  Rate: 89  Rhythm: normal sinus rhythm  Axis: Normal  Intervals:none  ST&T Change: None  RADIOLOGY Chest x-ray reviewed and interpreted by me with no infiltrate, edema, or effusion.  PROCEDURES:  Critical Care performed: Yes, see critical care procedure note(s)  .Critical Care  Performed by: Chesley Noon, MD Authorized by: Chesley Noon, MD   Critical care provider statement:    Critical care time (minutes):  30   Critical care time was exclusive of:  Separately billable procedures and treating other patients and teaching time   Critical care was necessary to treat or prevent imminent or life-threatening deterioration of the following conditions:  Respiratory failure   Critical care was time  spent personally by me on the following activities:  Development of treatment plan with patient or surrogate, discussions with consultants, evaluation of patient's response to treatment, examination of patient, ordering and review of laboratory studies, ordering and review of radiographic studies, ordering and performing treatments and interventions, pulse oximetry, re-evaluation of patient's condition and review of old charts   I assumed direction of critical care for this patient from another provider in my specialty: no     Care discussed with: admitting provider      MEDICATIONS ORDERED IN ED: Medications  magnesium sulfate IVPB 2 g 50 mL (2 g Intravenous New Bag/Given 09/27/23 2124)  albuterol (PROVENTIL) (2.5 MG/3ML) 0.083% nebulizer solution 2.5 mg (has no administration in time range)  ipratropium-albuterol (DUONEB) 0.5-2.5 (3) MG/3ML nebulizer solution 9 mL (9 mLs Nebulization Given 09/27/23 2007)  methylPREDNISolone sodium succinate (SOLU-MEDROL) 125 mg/2 mL injection 125 mg (125 mg Intravenous Given 09/27/23 2124)     IMPRESSION / MDM / ASSESSMENT AND PLAN / ED COURSE  I reviewed the triage vital signs and the nursing notes.                              52 y.o. male with past medical history of hypertension, diabetes, and CKD who presents to the ED with increasing difficulty breathing with dry cough over the course of the day today.  Patient's presentation is most consistent with acute presentation with potential threat to life or bodily function.  Differential diagnosis includes, but is not limited to, asthma exacerbation, ACS, PE, pneumothorax, pneumonia, CHF, anemia, electrolyte abnormality, AKI.  Patient ill-appearing and in moderate respiratory distress, noted to be tachypneic and hypoxic on room air.  Oxygen saturations improved on 2 L nasal cannula, he remains tachypneic with significant wheezing.  We will treat with IV Solu-Medrol, IV magnesium, and back-to-back DuoNebs x 3.   EKG shows no evidence of arrhythmia or ischemia, chest x-ray and labs are pending at this time.  Chest x-ray is unremarkable, labs show leukocytosis but no findings concerning for infectious process.  No significant anemia, renal function stable compared to previous with no acute electrolyte abnormality, troponin within normal limits.  Patient's work of breathing improved following DuoNeb's, Solu-Medrol, and magnesium, but he continues to be tachypneic with significant wheezing.  We will give additional albuterol, case discussed with hospitalist for admission.      FINAL CLINICAL IMPRESSION(S) / ED DIAGNOSES   Final diagnoses:  Exacerbation of persistent asthma, unspecified asthma severity  Acute respiratory failure with hypoxia (HCC)     Rx / DC Orders   ED Discharge Orders     None  Note:  This document was prepared using Dragon voice recognition software and may include unintentional dictation errors.   Chesley Noon, MD 09/27/23 2211

## 2023-09-27 NOTE — Assessment & Plan Note (Signed)
Acute bronchitis Acute respiratory failure with hypoxia O2 sat 85% on room air, using accessory muscles requiring 2 L O2 Schedule and as needed nebulized bronchodilators Azithromycin given WBC 19,000 but no recent steroid use though could be reactive Antitussives Incentive spirometer Continue supplemental oxygen and wean as tolerated

## 2023-09-27 NOTE — ED Notes (Addendum)
Pt assisted to sitting up on side of stretcher per his request. Audible wheezing present. Pt states he is feeling a little better.

## 2023-09-27 NOTE — H&P (Signed)
History and Physical    Patient: Clayton [name] ZJI:[id number] DOB: 1971-01-27 DOA: 09/27/2023 DOS: the patient was seen and examined on 09/27/2023 PCP: Altamese [place], MD  Patient coming from: Home  Chief Complaint:  Chief Complaint  Patient presents with   Shortness of Breath    HPI: Clayton [name] is a 52 y.o. male with medical history significant for hypertension, diabetes mellitus, CKD stage IIIb, history of hospitalization in July 2022 with hypertensive encephalopathy with seizure, history of asthma with prior related hospitalizations, who presents to the ED with a several day history of wheezing that acutely worsened in the 30 minutes prior to arrival.  Patient had  been using his asthma inhalers but he ran out .  He denies chest pain, fever or chills.  Denies lower extremity pain or swelling.  On arrival to triage O2 sat was 85% on room air and he was using accessory muscles and tripoding and was placed on O2 at 2 L. ED course and data review: Tachypneic to 28 with O2 sat 85% on room air requiring 2 L to maintain sats between 92 and 97.  Afebrile, heart rate 85, BP 159/99 Labs notable for the following: WBC 19,000, respiratory viral panel negative for COVID flu and RSV Blood sugar 277 Creatinine 3.  Most recent check was 2 years prior at 1.87 Troponin 11 EKG, personally viewed and interpreted showing NSR at 89 with no ischemic ST-T wave changes. Chest x-ray showed no acute cardiopulmonary disease. Patient was treated with DuoNebs, magnesium and Solu-Medrol with some improvement but continued to have increased work of breathing. Hospitalist consulted for admission.   Review of Systems: As mentioned in the history of present illness. All other systems reviewed and are negative.  Past Medical History:  Diagnosis Date   Asthma    CKD (chronic kidney disease), stage III (HCC)    Hypertension    Type II diabetes mellitus with renal manifestations (HCC)    Past Surgical  History:  Procedure Laterality Date   CATARACT EXTRACTION     Social History:  reports that he has never smoked. He has never used smokeless tobacco. He reports current alcohol use. He reports that he does not currently use drugs.  No Known Allergies  Family History  Problem Relation Age of Onset   Stroke Mother    Hypertension Mother    Hypertension Father     Prior to Admission medications   Medication Sig Start Date End Date Taking? Authorizing Provider  amLODipine (NORVASC) 10 MG tablet Take 1 tablet (10 mg total) by mouth daily. 04/23/21 12/18/22  Darlin Priestly, MD  carvedilol (COREG) 25 MG tablet Take 1 tablet (25 mg total) by mouth 2 (two) times daily. 04/23/21 12/18/22  Darlin Priestly, MD  chlorthalidone (HYGROTON) 25 MG tablet Take 1 tablet (25 mg total) by mouth daily. 04/25/21 07/24/21  Darlin Priestly, MD  Cholecalciferol 50 MCG (2000 UT) TABS Take by mouth. 09/01/20   [provider]  empagliflozin (JARDIANCE) 10 MG TABS tablet Take 1 tablet by mouth daily. 12/04/22   [provider]  hydrALAZINE (APRESOLINE) 50 MG tablet Take 1.5 tablets (75 mg total) by mouth 3 (three) times daily. 04/25/21 12/18/22  Darlin Priestly, MD  Insulin Lispro Prot & Lispro (HUMALOG 75/25 MIX) (75-25) 100 UNIT/ML Kwikpen Inject 15 Units into the skin 2 (two) times daily. Can give equivalent if covered by insurance.  Pls provide needles. 04/25/21 12/18/22  Darlin Priestly, MD  ketorolac (ACULAR) 0.5 % ophthalmic solution Place 1  drop into both eyes every 6 (six) hours. 12/18/22   Immordino, Jeannett Senior, FNP  levETIRAcetam (KEPPRA) 1000 MG tablet Take 1 tablet (1,000 mg total) by mouth 2 (two) times daily. 04/23/21 07/22/21  Darlin Priestly, MD  losartan (COZAAR) 100 MG tablet Take 1 tablet (100 mg total) by mouth daily. 04/23/21 12/18/22  Darlin Priestly, MD  metFORMIN (GLUMETZA) 1000 MG (MOD) 24 hr tablet Take 1 tablet (1,000 mg total) by mouth 2 (two) times daily. Increase from 500 mg. 04/23/21 12/18/22  Darlin Priestly, MD  tadalafil (CIALIS) 20 MG tablet  Take by mouth. 10/31/20   [provider]  Vitamin D, Ergocalciferol, (DRISDOL) 1.25 MG (50000 UNIT) CAPS capsule Take 50,000 Units by mouth once a week. 10/31/20   [provider]  insulin aspart protamine - aspart (NOVOLOG 70/30 MIX) (70-30) 100 UNIT/ML FlexPen Inject 0.15 mLs (15 Units total) into the skin 2 (two) times daily. 04/25/21 04/25/21  Darlin Priestly, MD    Physical Exam: Vitals:   09/27/23 2001 09/27/23 2003 09/27/23 2023  BP: (!) 159/99    Pulse: 85    Resp: (!) 28    SpO2: (!) 85% 92% 97%  Weight:  117.9 kg   Height:  6\' 2"  (1.88 m)    Physical Exam  Labs on Admission: I have personally reviewed following labs and imaging studies  CBC: Recent Labs  Lab 09/27/23 2024  WBC 19.3*  NEUTROABS 16.2*  HGB 15.9  HCT 45.6  MCV 92.1  PLT 282   Basic Metabolic Panel: Recent Labs  Lab 09/27/23 2024  NA 133*  K 4.2  CL 98  CO2 25  GLUCOSE 277*  BUN 43*  CREATININE 3.00*  CALCIUM 9.5   GFR: Estimated Creatinine Clearance: 39.8 mL/min (A) (by C-G formula based on SCr of 3 mg/dL (H)). Liver Function Tests: No results for input(s): "AST", "ALT", "ALKPHOS", "BILITOT", "PROT", "ALBUMIN" in the last 168 hours. No results for input(s): "LIPASE", "AMYLASE" in the last 168 hours. No results for input(s): "AMMONIA" in the last 168 hours. Coagulation Profile: No results for input(s): "INR", "PROTIME" in the last 168 hours. Cardiac Enzymes: No results for input(s): "CKTOTAL", "CKMB", "CKMBINDEX", "TROPONINI" in the last 168 hours. BNP (last 3 results) No results for input(s): "PROBNP" in the last 8760 hours. HbA1C: No results for input(s): "HGBA1C" in the last 72 hours. CBG: Recent Labs  Lab 09/27/23 2022  GLUCAP 240*   Lipid Profile: No results for input(s): "CHOL", "HDL", "LDLCALC", "TRIG", "CHOLHDL", "LDLDIRECT" in the last 72 hours. Thyroid Function Tests: No results for input(s): "TSH", "T4TOTAL", "FREET4", "T3FREE", "THYROIDAB" in the last 72  hours. Anemia Panel: No results for input(s): "VITAMINB12", "FOLATE", "FERRITIN", "TIBC", "IRON", "RETICCTPCT" in the last 72 hours. Urine analysis:    Component Value Date/Time   COLORURINE YELLOW (A) 04/19/2021 1018   APPEARANCEUR HAZY (A) 04/19/2021 1018   LABSPEC 1.017 04/19/2021 1018   PHURINE 5.0 04/19/2021 1018   GLUCOSEU >=500 (A) 04/19/2021 1018   HGBUR SMALL (A) 04/19/2021 1018   BILIRUBINUR NEGATIVE 04/19/2021 1018   KETONESUR 5 (A) 04/19/2021 1018   PROTEINUR 100 (A) 04/19/2021 1018   NITRITE NEGATIVE 04/19/2021 1018   LEUKOCYTESUR NEGATIVE 04/19/2021 1018    Radiological Exams on Admission: DG Chest Portable 1 View  Result Date: 09/27/2023 CLINICAL DATA:  Shortness of breath EXAM: PORTABLE CHEST 1 VIEW COMPARISON:  CT chest dated 04/17/2021 FINDINGS: Lungs are clear.  No pleural effusion or pneumothorax. The heart is normal in size. IMPRESSION: No acute cardiopulmonary  disease. Electronically Signed   By: Charline Bills M.D.   On: 09/27/2023 20:59     Data Reviewed: Relevant notes from primary care and specialist visits, past discharge summaries as available in EHR, including Care Everywhere. Prior diagnostic testing as pertinent to current admission diagnoses Updated medications and problem lists for reconciliation ED course, including vitals, labs, imaging, treatment and response to treatment Triage notes, nursing and pharmacy notes and ED provider's notes Notable results as noted in HPI   Assessment and Plan: * Acute asthma exacerbation Acute bronchitis Acute respiratory failure with hypoxia O2 sat 85% on room air, using accessory muscles requiring 2 L O2 Schedule and as needed nebulized bronchodilators Azithromycin given WBC 19,000 but no recent steroid use though could be reactive Antitussives Incentive spirometer Continue supplemental oxygen and wean as tolerated  Acute renal failure superimposed on stage 3b chronic kidney disease (HCC) Suspect  progression of CKD Monitor renal function and avoid nephrotoxins Giving a fluid bolus  Uncontrolled type 2 diabetes mellitus with hyperglycemia, with long-term current use of insulin (HCC) Blood glucose 277 with normal anion gap Basal insulin with sliding scale coverage  HTN (hypertension) History of hypertensive encephalopathy with seizures Blood pressure only slightly elevated to 159/99 Resume home amlodipine and losartan     DVT prophylaxis: Lovenox  Consults: none  Advance Care Planning:   Code Status: Prior   Family Communication: none  Disposition Plan: Back to previous home environment  Severity of Illness: The appropriate patient status for this patient is OBSERVATION. Observation status is judged to be reasonable and necessary in order to provide the required intensity of service to ensure the patient's safety. The patient's presenting symptoms, physical exam findings, and initial radiographic and laboratory data in the context of their medical condition is felt to place them at decreased risk for further clinical deterioration. Furthermore, it is anticipated that the patient will be medically stable for discharge from the hospital within 2 midnights of admission.   Author: Andris Baumann, MD 09/27/2023 10:27 PM  For on call review [email or web address].

## 2023-09-27 NOTE — Assessment & Plan Note (Signed)
Blood glucose 277 with normal anion gap Basal insulin with sliding scale coverage

## 2023-09-27 NOTE — ED Notes (Signed)
Pt refusing IV catheter at this time - expressed need for IV but pt continues to refuse due to shortness of breath.

## 2023-09-27 NOTE — ED Triage Notes (Signed)
Pt arrives via POV with CC of SOB that began 30 mins prior to arrival. Hx of asthma - has empty inhaler at bedside. Denies CP and dizziness at this time. SpO2 85% on room air - initiated 2L [place]. Pts breathing labored with accessory muscle use and tripoding.

## 2023-09-28 ENCOUNTER — Other Ambulatory Visit (HOSPITAL_COMMUNITY): Payer: Self-pay

## 2023-09-28 DIAGNOSIS — J45901 Unspecified asthma with (acute) exacerbation: Secondary | ICD-10-CM | POA: Diagnosis not present

## 2023-09-28 LAB — CBC
HCT: 45.2 % (ref 39.0–52.0)
Hemoglobin: 15.3 g/dL (ref 13.0–17.0)
MCH: 31.4 pg (ref 26.0–34.0)
MCHC: 33.8 g/dL (ref 30.0–36.0)
MCV: 92.8 fL (ref 80.0–100.0)
Platelets: 290 10*3/uL (ref 150–400)
RBC: 4.87 MIL/uL (ref 4.22–5.81)
RDW: 12.5 % (ref 11.5–15.5)
WBC: 12.5 10*3/uL — ABNORMAL HIGH (ref 4.0–10.5)
nRBC: 0 % (ref 0.0–0.2)

## 2023-09-28 LAB — BASIC METABOLIC PANEL
Anion gap: 11 (ref 5–15)
BUN: 42 mg/dL — ABNORMAL HIGH (ref 6–20)
CO2: 21 mmol/L — ABNORMAL LOW (ref 22–32)
Calcium: 9.4 mg/dL (ref 8.9–10.3)
Chloride: 102 mmol/L (ref 98–111)
Creatinine, Ser: 2.58 mg/dL — ABNORMAL HIGH (ref 0.61–1.24)
GFR, Estimated: 29 mL/min — ABNORMAL LOW (ref >60)
Glucose, Bld: 232 mg/dL — ABNORMAL HIGH (ref 70–99)
Potassium: 4.3 mmol/L (ref 3.5–5.1)
Sodium: 134 mmol/L — ABNORMAL LOW (ref 135–145)

## 2023-09-28 LAB — CBG MONITORING, ED
Glucose-Capillary: 256 mg/dL — ABNORMAL HIGH (ref 70–99)
Glucose-Capillary: 275 mg/dL — ABNORMAL HIGH (ref 70–99)

## 2023-09-28 LAB — MAGNESIUM: Magnesium: 2.8 mg/dL — ABNORMAL HIGH (ref 1.7–2.4)

## 2023-09-28 LAB — PHOSPHORUS: Phosphorus: 2.9 mg/dL (ref 2.5–4.6)

## 2023-09-28 LAB — HEMOGLOBIN A1C
Hgb A1c MFr Bld: 6.9 % — ABNORMAL HIGH (ref 4.8–5.6)
Mean Plasma Glucose: 151.33 mg/dL

## 2023-09-28 MED ORDER — PANTOPRAZOLE SODIUM 40 MG PO TBEC: 40.0000 mg | DELAYED_RELEASE_TABLET | Freq: Every day | ORAL | 0 refills | Status: DC

## 2023-09-28 MED ORDER — PREDNISONE 20 MG PO TABS: 40.0000 mg | ORAL_TABLET | Freq: Every day | ORAL | 0 refills | Status: AC

## 2023-09-28 MED ORDER — GUAIFENESIN ER 600 MG PO TB12: 600.0000 mg | ORAL_TABLET | Freq: Two times a day (BID) | ORAL | 0 refills | Status: AC

## 2023-09-28 MED ORDER — FLUTICASONE FUROATE-VILANTEROL 200-25 MCG/ACT IN AEPB: 1.0000 | INHALATION_SPRAY | Freq: Every day | RESPIRATORY_TRACT | 2 refills | Status: AC

## 2023-09-28 MED ORDER — FLUTICASONE FUROATE-VILANTEROL 200-25 MCG/ACT IN AEPB
1.0000 | INHALATION_SPRAY | Freq: Every day | RESPIRATORY_TRACT | Status: DC
  Administered 2023-09-28: 1 via RESPIRATORY_TRACT
  Filled 2023-09-28 (×2): qty 28

## 2023-09-28 MED ORDER — LORATADINE 10 MG PO TABS: 10.0000 mg | ORAL_TABLET | Freq: Every day | ORAL | 0 refills | Status: DC

## 2023-09-28 MED ORDER — MENTHOL 3 MG MT LOZG
1.0000 | LOZENGE | OROMUCOSAL | Status: DC | PRN
  Filled 2023-09-28: qty 9

## 2023-09-28 MED ORDER — AZITHROMYCIN 500 MG PO TABS: 500.0000 mg | ORAL_TABLET | Freq: Every day | ORAL | 0 refills | Status: AC

## 2023-09-28 MED ORDER — BENZONATATE 100 MG PO CAPS: 100.0000 mg | ORAL_CAPSULE | Freq: Three times a day (TID) | ORAL | Status: DC | PRN

## 2023-09-28 MED ORDER — BENZONATATE 100 MG PO CAPS: 100.0000 mg | ORAL_CAPSULE | Freq: Three times a day (TID) | ORAL | 0 refills | Status: DC | PRN

## 2023-09-28 MED ORDER — METFORMIN HCL ER (MOD) 1000 MG PO TB24: 1000.0000 mg | ORAL_TABLET | Freq: Two times a day (BID) | ORAL | 11 refills | Status: DC

## 2023-09-28 MED ORDER — ALBUTEROL SULFATE HFA 108 (90 BASE) MCG/ACT IN AERS
2.0000 | INHALATION_SPRAY | Freq: Four times a day (QID) | RESPIRATORY_TRACT | 2 refills | Status: DC | PRN
Start: 2023-09-28 — End: 2024-06-24

## 2023-09-28 MED ORDER — LIVING WELL WITH DIABETES BOOK
Freq: Once | Status: DC
  Filled 2023-09-28: qty 1

## 2023-09-28 NOTE — ED Notes (Signed)
CCMD called and made aware of patient transferring rooms.

## 2023-09-28 NOTE — Discharge Summary (Signed)
[facility] Discharge Summary   Patient: Clayton [name] ZOX:[id number]  PCP: Altamese [place], MD  Date of admission: 09/27/2023   Date of discharge:  09/28/2023     Discharge Diagnoses:  Principal Problem:   Acute asthma exacerbation Active Problems:   Acute bronchitis   Acute respiratory failure with hypoxia (HCC)   Acute renal failure superimposed on stage 3b chronic kidney disease (HCC)   Uncontrolled type 2 diabetes mellitus with hyperglycemia, with long-term current use of insulin (HCC)   HTN (hypertension)   Admitted From: Home Disposition:  Home   Recommendations for Outpatient Follow-up:  Follow-up with PCP in 1 week Follow-up with pulmonologist in 1 week to check PFTs Follow up LABS/TEST: BMP after 1 week   Follow-up Information     Imseis, Janeece Fitting, MD Follow up in 1 week(s).   Specialty: Internal Medicine Contact information: [address] [place] [address] [phone]         Salena Saner, MD Follow up in 1 week(s).   Specialty: Pulmonary Disease Why: For PFTs Contact information: [address] [place] [address] [phone]                Diet recommendation: Cardiac and Carb modified diet  Activity: The patient is advised to gradually reintroduce usual activities, as tolerated  Discharge Condition: stable  Code Status: Full code   History of present illness: As per the H and P dictated on admission Hospital Course:  Ke Bungay is a 52 y.o. male with medical history significant for hypertension, diabetes mellitus, CKD stage IIIb, history of hospitalization in July 2022 with hypertensive encephalopathy with seizure, history of asthma with no prior related hospitalizations, who presents to the ED with a several day history of wheezing that acutely worsened in the 30 minutes prior to arrival.  Patient states he has had a cough for the past month, nonproductive.  Had been using an inhaler but  ran out.  He denies chest pain, fever or chills.  Denies lower extremity pain or swelling.  On arrival to triage O2 sat was 85% on room air and he was using accessory muscles and tripoding and was placed on O2 at 2 L. ED course and data review: Tachypneic RR 28 O2 sat 85% on RA requiring 2 L to maintain sats between 92 and 97.  Afebrile, HR 85, BP 159/99 Labs: WBC 19,000, respiratory viral panel negative for COVID flu and RSV Blood sugar 277 Creatinine 3.  Most recent check was 2 years prior at 1.87 Troponin 11 EKG, personally viewed and interpreted showing NSR at 89 with no ischemic ST-T wave changes. Chest x-ray showed no acute cardiopulmonary disease. Patient was treated with DuoNebs, magnesium and Solu-Medrol with some improvement but continued to have increased work of breathing. Hospitalist consulted for admission.   Assessment and plan # Acute asthma exacerbation # Acute bronchitis # Acute respiratory failure with hypoxia.  Resolved O2 sat 85% on room air, using accessory muscles requiring 2 L O2 S/p Schedule and PRN nebulized bronchodilators. S/p Azithromycin given WBC 19,000 but no recent steroid use though could be reactive Antitussives and Incentive spirometer.  Supplemental O2 relation gradually weaned off, patient is saturating well on room air.  Patient was discharged home on azithromycin 500 mg mg p.o. daily for 3 days, Breo Ellipta inhaler, Mucinex 600 twice daily for 5 days, prednisone 40 mg p.o. daily for 5 days.  Patient was advised to follow with PCP in 1 week  and pulmonologist in 1 week.   # Acute renal failure superimposed on stage 3b chronic kidney disease Baseline creatinine 1.78 2 years ago.  Patient was admitted with creatinine 3.0, slightly improved to creatinine 2.58 s/p fluid bolus was given in the ED.  Patient was advised to follow-up with PCP and may benefit from follow-up with nephrology as an outpatient.  Repeat BMP after 1 week  # Uncontrolled type 2 diabetes  mellitus with hyperglycemia, with long-term current use of insulin: s/p NovoLog sliding scale, resumed home dose insulin.  Advised to check CBG, continue diabetic diet and follow with PCP. # HTN (hypertension): History of hypertensive encephalopathy with seizures. Blood pressure only slightly elevated to 159/99 Resume home amlodipine and losartan, Coreg   # Vitamin D deficiency: started vitamin D 50,000 units p.o. weekly, follow with PCP to repeat vitamin D level after 3 to 6 months.  # Obesity class I Body mass index is 33.38 kg/m.  Nutrition Interventions: Calorie restricted diet and daily exercise advised to lose body weight.  Lifestyle modification discussed.  Patient was ambulatory without any assistance. On the day of the discharge the patient's vitals were stable, and no other acute medical condition were reported by patient. the patient was felt safe to be discharge at Home.  Consultants: None Procedures: None  Discharge Exam: General: Appear in no distress, oral Mucosa Clear, moist. Cardiovascular: S1 and S2 Present, no Murmur, Respiratory: Equal air entry bilaterally, no crackles, mild wheezing.  Mild shortness of breath. Abdomen: Bowel Sound present, Soft and no tenderness, Extremities: no Pedal edema, no calf tenderness Neurology: alert and oriented to time, place, and person affect appropriate.  Filed Weights   09/27/23 2003  Weight: 117.9 kg   Vitals:   09/28/23 0922 09/28/23 1001  BP: 128/71   Pulse: 85   Resp: 20   Temp:  98.2 F (36.8 C)  SpO2: 100%     DISCHARGE MEDICATION: Allergies as of 09/28/2023   No Known Allergies      Medication List     STOP taking these medications    chlorthalidone 25 MG tablet Commonly known as: HYGROTON   Cholecalciferol 50 MCG (2000 UT) Tabs   hydrALAZINE 50 MG tablet Commonly known as: APRESOLINE   ketorolac 0.5 % ophthalmic solution Commonly known as: ACULAR   levETIRAcetam 1000 MG tablet Commonly known  as: Keppra   olmesartan 40 MG tablet Commonly known as: BENICAR   tadalafil 20 MG tablet Commonly known as: CIALIS       TAKE these medications    albuterol 108 (90 Base) MCG/ACT inhaler Commonly known as: VENTOLIN HFA Inhale 2 puffs into the lungs every 6 (six) hours as needed for wheezing or shortness of breath.   amLODipine 10 MG tablet Commonly known as: NORVASC Take 1 tablet (10 mg total) by mouth daily.   azithromycin 500 MG tablet Commonly known as: ZITHROMAX Take 1 tablet (500 mg total) by mouth daily for 3 days.   benzonatate 100 MG capsule Commonly known as: TESSALON Take 1 capsule (100 mg total) by mouth 3 (three) times daily as needed for cough.   carvedilol 25 MG tablet Commonly known as: COREG Take 1 tablet (25 mg total) by mouth 2 (two) times daily.   empagliflozin 25 MG Tabs tablet Commonly known as: JARDIANCE Take 1 tablet by mouth daily. What changed: Another medication with the same name was removed. Continue taking this medication, and follow the directions you see here.   fluticasone furoate-vilanterol 200-25 MCG/ACT  Aepb Commonly known as: BREO ELLIPTA Inhale 1 puff into the lungs daily. Start taking on: September 29, 2023   guaiFENesin 600 MG 12 hr tablet Commonly known as: MUCINEX Take 1 tablet (600 mg total) by mouth 2 (two) times daily for 5 days.   Insulin Lispro Prot & Lispro (75-25) 100 UNIT/ML Kwikpen Commonly known as: HUMALOG 75/25 MIX Inject 15 Units into the skin 2 (two) times daily. Can give equivalent if covered by insurance.  Pls provide needles.   loratadine 10 MG tablet Commonly known as: Claritin Take 1 tablet (10 mg total) by mouth at bedtime for 10 days.   losartan 100 MG tablet Commonly known as: COZAAR Take 1 tablet (100 mg total) by mouth daily.   metFORMIN 1000 MG (MOD) 24 hr tablet Commonly known as: GLUMETZA Take 1 tablet (1,000 mg total) by mouth 2 (two) times daily with a meal. What changed:  medication  strength Another medication with the same name was removed. Continue taking this medication, and follow the directions you see here.   pantoprazole 40 MG tablet Commonly known as: Protonix Take 1 tablet (40 mg total) by mouth daily for 10 days.   predniSONE 20 MG tablet Commonly known as: DELTASONE Take 2 tablets (40 mg total) by mouth daily with breakfast for 5 days. Start taking on: September 29, 2023   rosuvastatin 20 MG tablet Commonly known as: CRESTOR Take 20 mg by mouth daily.   Vitamin D (Ergocalciferol) 1.25 MG (50000 UNIT) Caps capsule Commonly known as: DRISDOL Take 50,000 Units by mouth once a week.       No Known Allergies Discharge Instructions     Call MD for:  difficulty breathing, headache or visual disturbances   Complete by: As directed    Call MD for:  extreme fatigue   Complete by: As directed    Call MD for:  persistant dizziness or light-headedness   Complete by: As directed    Call MD for:  persistant nausea and vomiting   Complete by: As directed    Call MD for:  severe uncontrolled pain   Complete by: As directed    Call MD for:  temperature >100.4   Complete by: As directed    Diet - low sodium heart healthy   Complete by: As directed    Discharge instructions   Complete by: As directed    Follow-up with PCP in 1 week Follow-up with pulmonologist in 1 week to check PFTs   Increase activity slowly   Complete by: As directed        The results of significant diagnostics from this hospitalization (including imaging, microbiology, ancillary and laboratory) are listed below for reference.    Significant Diagnostic Studies: DG Chest Portable 1 View  Result Date: 09/27/2023 CLINICAL DATA:  Shortness of breath EXAM: PORTABLE CHEST 1 VIEW COMPARISON:  CT chest dated 04/17/2021 FINDINGS: Lungs are clear.  No pleural effusion or pneumothorax. The heart is normal in size. IMPRESSION: No acute cardiopulmonary disease. Electronically Signed   By:  Charline Bills M.D.   On: 09/27/2023 20:59    Microbiology: Recent Results (from the past 240 hour(s))  Resp panel by RT-PCR (RSV, Flu A&B, Covid) Anterior Nasal Swab     Status: None   Collection Time: 09/27/23  8:25 PM   Specimen: Anterior Nasal Swab  Result Value Ref Range Status   SARS Coronavirus 2 by RT PCR NEGATIVE NEGATIVE Final    Comment: (NOTE) SARS-CoV-2 target nucleic acids are  NOT DETECTED.  The SARS-CoV-2 RNA is generally detectable in upper respiratory specimens during the acute phase of infection. The lowest concentration of SARS-CoV-2 viral copies this assay can detect is 138 copies/mL. A negative result does not preclude SARS-Cov-2 infection and should not be used as the sole basis for treatment or other patient management decisions. A negative result may occur with  improper specimen collection/handling, submission of specimen other than nasopharyngeal swab, presence of viral mutation(s) within the areas targeted by this assay, and inadequate number of viral copies(<138 copies/mL). A negative result must be combined with clinical observations, patient history, and epidemiological information. The expected result is Negative.  Fact Sheet for Patients:  BloggerCourse.com  Fact Sheet for Healthcare Providers:  SeriousBroker.it  This test is no t yet approved or cleared by the [place] FDA and  has been authorized for detection and/or diagnosis of SARS-CoV-2 by FDA under an Emergency Use Authorization (EUA). This EUA will remain  in effect (meaning this test can be used) for the duration of the COVID-19 declaration under Section 564(b)(1) of the Act, 21 U.S.C.section 360bbb-3(b)(1), unless the authorization is terminated  or revoked sooner.       Influenza A by PCR NEGATIVE NEGATIVE Final   Influenza B by PCR NEGATIVE NEGATIVE Final    Comment: (NOTE) The Xpert Xpress SARS-CoV-2/FLU/RSV plus assay is  intended as an aid in the diagnosis of influenza from Nasopharyngeal swab specimens and should not be used as a sole basis for treatment. Nasal washings and aspirates are unacceptable for Xpert Xpress SARS-CoV-2/FLU/RSV testing.  Fact Sheet for Patients: BloggerCourse.com  Fact Sheet for Healthcare Providers: SeriousBroker.it  This test is not yet approved or cleared by the [place] FDA and has been authorized for detection and/or diagnosis of SARS-CoV-2 by FDA under an Emergency Use Authorization (EUA). This EUA will remain in effect (meaning this test can be used) for the duration of the COVID-19 declaration under Section 564(b)(1) of the Act, 21 U.S.C. section 360bbb-3(b)(1), unless the authorization is terminated or revoked.     Resp Syncytial Virus by PCR NEGATIVE NEGATIVE Final    Comment: (NOTE) Fact Sheet for Patients: BloggerCourse.com  Fact Sheet for Healthcare Providers: SeriousBroker.it  This test is not yet approved or cleared by the [place] FDA and has been authorized for detection and/or diagnosis of SARS-CoV-2 by FDA under an Emergency Use Authorization (EUA). This EUA will remain in effect (meaning this test can be used) for the duration of the COVID-19 declaration under Section 564(b)(1) of the Act, 21 U.S.C. section 360bbb-3(b)(1), unless the authorization is terminated or revoked.  Performed at [facility], [address] [place], [place] [address]      Labs: CBC: Recent Labs  Lab 09/27/23 2024 09/28/23 0547  WBC 19.3* 12.5*  NEUTROABS 16.2*  --   HGB 15.9 15.3  HCT 45.6 45.2  MCV 92.1 92.8  PLT 282 290   Basic Metabolic Panel: Recent Labs  Lab 09/27/23 2024 09/28/23 0547  NA 133* 134*  K 4.2 4.3  CL 98 102  CO2 25 21*  GLUCOSE 277* 232*  BUN 43* 42*  CREATININE 3.00* 2.58*  CALCIUM 9.5 9.4  MG  --  2.8*  PHOS   --  2.9   Liver Function Tests: No results for input(s): "AST", "ALT", "ALKPHOS", "BILITOT", "PROT", "ALBUMIN" in the last 168 hours. No results for input(s): "LIPASE", "AMYLASE" in the last 168 hours. No results for input(s): "AMMONIA" in the last 168 hours. Cardiac  Enzymes: No results for input(s): "CKTOTAL", "CKMB", "CKMBINDEX", "TROPONINI" in the last 168 hours. BNP (last 3 results) No results for input(s): "BNP" in the last 8760 hours. CBG: Recent Labs  Lab 09/27/23 2022 09/27/23 2322 09/28/23 0737 09/28/23 1152  GLUCAP 240* 229* 256* 275*    Time spent: 35 minutes  Signed:  Gillis Santa  [facility] 09/28/2023 12:57 PM

## 2023-09-28 NOTE — ED Notes (Signed)
Assumed patient care. Received report from the previous nurse. Patient resting comfortably at this time.

## 2023-09-28 NOTE — ED Notes (Signed)
Pt resting on stretcher with iv fluids infusing without difficulty. States he is still wheezing but feels better. Provided for comfort and safety. Will continue to assess.

## 2023-09-28 NOTE — Inpatient Diabetes Management (Addendum)
Inpatient Diabetes Program Recommendations  AACE/ADA: New Consensus Statement on Inpatient Glycemic Control (2015)  Target Ranges:  Prepandial:   less than 140 mg/dL      Peak postprandial:   less than 180 mg/dL (1-2 hours)      Critically ill patients:  140 - 180 mg/dL   Lab Results  Component Value Date   GLUCAP 256 (H) 09/28/2023   HGBA1C 6.9 (H) 09/27/2023    Review of Glycemic Control  Diabetes history: DM2 Outpatient Diabetes medications: 75/25 15 units bid, Jardiance 25 mg daily, Metformin 1 gm bid Current orders for Inpatient glycemic control: Novolog 0-15 units tid, 0-5 units hs, Solumedrol 40 mg q 12 hrs. Then switch to Prednisone 40 mg daily starting in am  Inpatient Diabetes Program Recommendations:   Spoke with patient regarding need for CGM. Patient states he has a prescription for Libre 3 but his pharmacy says have been on back order and unable to fill his prescription. Will provide CGM for patient. 11:45 am. MD ordered application of Freestyle CGM at discharge for patient. Education done regarding application and changing CGM sensor (alternate every 14 days on back of arms), 1 hour warm-up, use of glucometer when alert displays, how to scan CGM for glucose reading and information for PCP. Patient has also been given educational packet regarding use CGM sensor including the 1-800 toll free number for any questions, problems or needs related to the [facility] sensors or reader.    Sensor applied by patient to (R) Arm at 11:15 am.  Explained that glucose readings will not be available until 1 hour after application. Reviewed use of CGM including how to scan, changing Sensor, Vitamin C warning, arrows with glucose readings, and Freestyle app. Patient very appreciative.   Thank you, Clayton [name]. Clayton Sweetin, RN, MSN, CDCES  Diabetes Coordinator Inpatient Glycemic Control Team Team Pager [phone] (8am-5pm) 09/28/2023 10:26 AM

## 2023-09-28 NOTE — ED Notes (Addendum)
Pt ambulatory to restroom with steady gait. Slight increased work of breathing up return to stretcher with O2 saturation of 88% after being without his nasal cannula. O2 placed on pt and VS assessed. IV fluids infusing without difficulty. Provided for pt safety and comfort and will continue to assess.

## 2023-09-30 ENCOUNTER — Telehealth (HOSPITAL_COMMUNITY): Payer: Self-pay | Admitting: Pharmacy Technician

## 2023-09-30 NOTE — Telephone Encounter (Signed)
Pharmacy Patient Advocate Encounter  Received notification from [facility] that Prior Authorization for metFORMIN HCl ER (MOD) 1000MG  er tablets  has been DENIED.  Full denial letter will be uploaded to the media tab. See denial reason below. The requested medication and/or diagnosis are not a covered benefit and excluded from coverage in accordance with the terms and conditions of your plan benefit. Therefore, the request has been administratively denied  PA #/Case ID/Reference #: [id number]

## 2023-09-30 NOTE — Telephone Encounter (Signed)
Pharmacy Patient Advocate Encounter   Received notification from Fax that prior authorization for metFORMIN HCl ER (MOD) 1000MG  er tablets is required/requested.   Insurance verification completed.   The patient is insured through [facility] .   Per test claim: PA required; PA submitted to above mentioned insurance via CoverMyMeds Key/confirmation #/EOC [id number] Status is pending

## 2024-05-15 ENCOUNTER — Other Ambulatory Visit: Payer: Self-pay

## 2024-05-15 ENCOUNTER — Inpatient Hospital Stay: Payer: Self-pay | Admitting: Anesthesiology

## 2024-05-15 ENCOUNTER — Inpatient Hospital Stay: Payer: Self-pay

## 2024-05-15 ENCOUNTER — Encounter
Admission: EM | Disposition: A | Discharge status: Home Health | Payer: Self-pay | Source: Home / Self Care | Attending: Internal Medicine

## 2024-05-15 ENCOUNTER — Emergency Department: Payer: Self-pay

## 2024-05-15 ENCOUNTER — Encounter: Payer: Self-pay | Admitting: Internal Medicine

## 2024-05-15 ENCOUNTER — Inpatient Hospital Stay
Admission: EM | Admit: 2024-05-15 | Discharge: 2024-05-21 | DRG: 853 | Disposition: A | Discharge status: Home Health | Payer: MEDICAID | Attending: Internal Medicine | Admitting: Internal Medicine

## 2024-05-15 DIAGNOSIS — M21379 Foot drop, unspecified foot: Secondary | ICD-10-CM | POA: Diagnosis present

## 2024-05-15 DIAGNOSIS — R739 Hyperglycemia, unspecified: Secondary | ICD-10-CM

## 2024-05-15 DIAGNOSIS — Z23 Encounter for immunization: Secondary | ICD-10-CM | POA: Diagnosis not present

## 2024-05-15 DIAGNOSIS — Z9849 Cataract extraction status, unspecified eye: Secondary | ICD-10-CM

## 2024-05-15 DIAGNOSIS — E66811 Obesity, class 1: Secondary | ICD-10-CM | POA: Diagnosis present

## 2024-05-15 DIAGNOSIS — W228XXA Striking against or struck by other objects, initial encounter: Secondary | ICD-10-CM | POA: Diagnosis present

## 2024-05-15 DIAGNOSIS — D509 Iron deficiency anemia, unspecified: Secondary | ICD-10-CM | POA: Diagnosis present

## 2024-05-15 DIAGNOSIS — L03116 Cellulitis of left lower limb: Secondary | ICD-10-CM | POA: Diagnosis present

## 2024-05-15 DIAGNOSIS — N1832 Chronic kidney disease, stage 3b: Secondary | ICD-10-CM | POA: Diagnosis present

## 2024-05-15 DIAGNOSIS — E11628 Type 2 diabetes mellitus with other skin complications: Secondary | ICD-10-CM | POA: Diagnosis present

## 2024-05-15 DIAGNOSIS — D638 Anemia in other chronic diseases classified elsewhere: Secondary | ICD-10-CM | POA: Diagnosis present

## 2024-05-15 DIAGNOSIS — Z8249 Family history of ischemic heart disease and other diseases of the circulatory system: Secondary | ICD-10-CM | POA: Diagnosis not present

## 2024-05-15 DIAGNOSIS — Z79899 Other long term (current) drug therapy: Secondary | ICD-10-CM

## 2024-05-15 DIAGNOSIS — M86172 Other acute osteomyelitis, left ankle and foot: Secondary | ICD-10-CM | POA: Diagnosis present

## 2024-05-15 DIAGNOSIS — E1165 Type 2 diabetes mellitus with hyperglycemia: Secondary | ICD-10-CM

## 2024-05-15 DIAGNOSIS — L97524 Non-pressure chronic ulcer of other part of left foot with necrosis of bone: Secondary | ICD-10-CM | POA: Diagnosis present

## 2024-05-15 DIAGNOSIS — E11621 Type 2 diabetes mellitus with foot ulcer: Secondary | ICD-10-CM | POA: Diagnosis present

## 2024-05-15 DIAGNOSIS — I129 Hypertensive chronic kidney disease with stage 1 through stage 4 chronic kidney disease, or unspecified chronic kidney disease: Secondary | ICD-10-CM | POA: Diagnosis present

## 2024-05-15 DIAGNOSIS — E860 Dehydration: Secondary | ICD-10-CM | POA: Diagnosis present

## 2024-05-15 DIAGNOSIS — L97522 Non-pressure chronic ulcer of other part of left foot with fat layer exposed: Principal | ICD-10-CM

## 2024-05-15 DIAGNOSIS — N179 Acute kidney failure, unspecified: Secondary | ICD-10-CM | POA: Diagnosis present

## 2024-05-15 DIAGNOSIS — Z6833 Body mass index (BMI) 33.0-33.9, adult: Secondary | ICD-10-CM

## 2024-05-15 DIAGNOSIS — E871 Hypo-osmolality and hyponatremia: Secondary | ICD-10-CM | POA: Diagnosis present

## 2024-05-15 DIAGNOSIS — M609 Myositis, unspecified: Secondary | ICD-10-CM | POA: Diagnosis present

## 2024-05-15 DIAGNOSIS — E1122 Type 2 diabetes mellitus with diabetic chronic kidney disease: Secondary | ICD-10-CM | POA: Diagnosis present

## 2024-05-15 DIAGNOSIS — Z823 Family history of stroke: Secondary | ICD-10-CM

## 2024-05-15 DIAGNOSIS — E1169 Type 2 diabetes mellitus with other specified complication: Secondary | ICD-10-CM | POA: Diagnosis present

## 2024-05-15 DIAGNOSIS — T383X6A Underdosing of insulin and oral hypoglycemic [antidiabetic] drugs, initial encounter: Secondary | ICD-10-CM | POA: Diagnosis present

## 2024-05-15 DIAGNOSIS — E1110 Type 2 diabetes mellitus with ketoacidosis without coma: Secondary | ICD-10-CM | POA: Diagnosis present

## 2024-05-15 DIAGNOSIS — Z794 Long term (current) use of insulin: Secondary | ICD-10-CM

## 2024-05-15 DIAGNOSIS — A419 Sepsis, unspecified organism: Secondary | ICD-10-CM | POA: Diagnosis present

## 2024-05-15 DIAGNOSIS — M869 Osteomyelitis, unspecified: Secondary | ICD-10-CM

## 2024-05-15 DIAGNOSIS — Z7984 Long term (current) use of oral hypoglycemic drugs: Secondary | ICD-10-CM | POA: Diagnosis not present

## 2024-05-15 DIAGNOSIS — I10 Essential (primary) hypertension: Secondary | ICD-10-CM | POA: Diagnosis present

## 2024-05-15 DIAGNOSIS — Z91128 Patient's intentional underdosing of medication regimen for other reason: Secondary | ICD-10-CM

## 2024-05-15 DIAGNOSIS — D631 Anemia in chronic kidney disease: Secondary | ICD-10-CM | POA: Diagnosis present

## 2024-05-15 DIAGNOSIS — Z5971 Insufficient health insurance coverage: Secondary | ICD-10-CM

## 2024-05-15 DIAGNOSIS — Z91148 Patient's other noncompliance with medication regimen for other reason: Secondary | ICD-10-CM

## 2024-05-15 DIAGNOSIS — T797XXA Traumatic subcutaneous emphysema, initial encounter: Secondary | ICD-10-CM | POA: Diagnosis present

## 2024-05-15 DIAGNOSIS — R112 Nausea with vomiting, unspecified: Secondary | ICD-10-CM

## 2024-05-15 DIAGNOSIS — E669 Obesity, unspecified: Secondary | ICD-10-CM | POA: Diagnosis present

## 2024-05-15 HISTORY — PX: IRRIGATION AND DEBRIDEMENT FOOT: SHX6602

## 2024-05-15 HISTORY — PX: APPLICATION OF WOUND VAC: SHX5189

## 2024-05-15 LAB — GLUCOSE, CAPILLARY
Glucose-Capillary: 303 mg/dL — ABNORMAL HIGH (ref 70–99)
Glucose-Capillary: 311 mg/dL — ABNORMAL HIGH (ref 70–99)
Glucose-Capillary: 321 mg/dL — ABNORMAL HIGH (ref 70–99)

## 2024-05-15 LAB — URINE DRUG SCREEN, QUALITATIVE (ARMC ONLY)
Amphetamines, Ur Screen: NOT DETECTED
Barbiturates, Ur Screen: NOT DETECTED
Benzodiazepine, Ur Scrn: NOT DETECTED
Cannabinoid 50 Ng, Ur ~~LOC~~: NOT DETECTED
Cocaine Metabolite,Ur ~~LOC~~: NOT DETECTED
MDMA (Ecstasy)Ur Screen: NOT DETECTED
Methadone Scn, Ur: NOT DETECTED
Opiate, Ur Screen: NOT DETECTED
Phencyclidine (PCP) Ur S: NOT DETECTED
Tricyclic, Ur Screen: NOT DETECTED

## 2024-05-15 LAB — BASIC METABOLIC PANEL WITH GFR
Anion gap: 14 (ref 5–15)
Anion gap: 13 (ref 5–15)
Anion gap: 13 (ref 5–15)
BUN: 26 mg/dL — ABNORMAL HIGH (ref 6–20)
BUN: 27 mg/dL — ABNORMAL HIGH (ref 6–20)
BUN: 29 mg/dL — ABNORMAL HIGH (ref 6–20)
CO2: 22 mmol/L (ref 22–32)
CO2: 20 mmol/L — ABNORMAL LOW (ref 22–32)
CO2: 22 mmol/L (ref 22–32)
Calcium: 8.2 mg/dL — ABNORMAL LOW (ref 8.9–10.3)
Calcium: 8.1 mg/dL — ABNORMAL LOW (ref 8.9–10.3)
Calcium: 8 mg/dL — ABNORMAL LOW (ref 8.9–10.3)
Chloride: 93 mmol/L — ABNORMAL LOW (ref 98–111)
Chloride: 96 mmol/L — ABNORMAL LOW (ref 98–111)
Chloride: 96 mmol/L — ABNORMAL LOW (ref 98–111)
Creatinine, Ser: 2.53 mg/dL — ABNORMAL HIGH (ref 0.61–1.24)
Creatinine, Ser: 2.69 mg/dL — ABNORMAL HIGH (ref 0.61–1.24)
Creatinine, Ser: 2.82 mg/dL — ABNORMAL HIGH (ref 0.61–1.24)
GFR, Estimated: 30 mL/min — ABNORMAL LOW (ref >60)
GFR, Estimated: 28 mL/min — ABNORMAL LOW (ref >60)
GFR, Estimated: 26 mL/min — ABNORMAL LOW (ref >60)
Glucose, Bld: 422 mg/dL — ABNORMAL HIGH (ref 70–99)
Glucose, Bld: 353 mg/dL — ABNORMAL HIGH (ref 70–99)
Glucose, Bld: 336 mg/dL — ABNORMAL HIGH (ref 70–99)
Potassium: 4.3 mmol/L (ref 3.5–5.1)
Potassium: 3.8 mmol/L (ref 3.5–5.1)
Potassium: 3.9 mmol/L (ref 3.5–5.1)
Sodium: 129 mmol/L — ABNORMAL LOW (ref 135–145)
Sodium: 129 mmol/L — ABNORMAL LOW (ref 135–145)
Sodium: 131 mmol/L — ABNORMAL LOW (ref 135–145)

## 2024-05-15 LAB — COMPREHENSIVE METABOLIC PANEL WITH GFR
ALT: 22 U/L (ref 0–44)
AST: 29 U/L (ref 15–41)
Albumin: 2.7 g/dL — ABNORMAL LOW (ref 3.5–5.0)
Alkaline Phosphatase: 77 U/L (ref 38–126)
Anion gap: 18 — ABNORMAL HIGH (ref 5–15)
BUN: 25 mg/dL — ABNORMAL HIGH (ref 6–20)
CO2: 20 mmol/L — ABNORMAL LOW (ref 22–32)
Calcium: 8.7 mg/dL — ABNORMAL LOW (ref 8.9–10.3)
Chloride: 90 mmol/L — ABNORMAL LOW (ref 98–111)
Creatinine, Ser: 2.56 mg/dL — ABNORMAL HIGH (ref 0.61–1.24)
GFR, Estimated: 29 mL/min — ABNORMAL LOW (ref >60)
Glucose, Bld: 447 mg/dL — ABNORMAL HIGH (ref 70–99)
Potassium: 4.6 mmol/L (ref 3.5–5.1)
Sodium: 128 mmol/L — ABNORMAL LOW (ref 135–145)
Total Bilirubin: 2.2 mg/dL — ABNORMAL HIGH (ref 0.0–1.2)
Total Protein: 7.3 g/dL (ref 6.5–8.1)

## 2024-05-15 LAB — URINALYSIS, ROUTINE W REFLEX MICROSCOPIC
Bilirubin Urine: NEGATIVE
Glucose, UA: >=500 mg/dL — AB
Ketones, ur: 5 mg/dL — AB
Leukocytes,Ua: NEGATIVE
Nitrite: NEGATIVE
Protein, ur: >=300 mg/dL — AB
Specific Gravity, Urine: 1.02 (ref 1.005–1.030)
pH: 5 (ref 5.0–8.0)

## 2024-05-15 LAB — CBC
HCT: 32.6 % — ABNORMAL LOW (ref 39.0–52.0)
Hemoglobin: 10.5 g/dL — ABNORMAL LOW (ref 13.0–17.0)
MCH: 29 pg (ref 26.0–34.0)
MCHC: 32.2 g/dL (ref 30.0–36.0)
MCV: 90.1 fL (ref 80.0–100.0)
Platelets: 368 K/uL (ref 150–400)
RBC: 3.62 MIL/uL — ABNORMAL LOW (ref 4.22–5.81)
RDW: 11.8 % (ref 11.5–15.5)
WBC: 23.2 K/uL — ABNORMAL HIGH (ref 4.0–10.5)
nRBC: 0 % (ref 0.0–0.2)

## 2024-05-15 LAB — BLOOD GAS, VENOUS
Acid-base deficit: 1.7 mmol/L (ref 0.0–2.0)
Bicarbonate: 23 mmol/L (ref 20.0–28.0)
O2 Saturation: 55.8 %
Patient temperature: 37
pCO2, Ven: 38 mmHg — ABNORMAL LOW (ref 44–60)
pH, Ven: 7.39 (ref 7.25–7.43)
pO2, Ven: 34 mmHg (ref 32–45)

## 2024-05-15 LAB — C-REACTIVE PROTEIN: CRP: 28.4 mg/dL — ABNORMAL HIGH (ref <1.0)

## 2024-05-15 LAB — CBG MONITORING, ED
Glucose-Capillary: 371 mg/dL — ABNORMAL HIGH (ref 70–99)
Glucose-Capillary: 391 mg/dL — ABNORMAL HIGH (ref 70–99)
Glucose-Capillary: 356 mg/dL — ABNORMAL HIGH (ref 70–99)

## 2024-05-15 LAB — LACTIC ACID, PLASMA: Lactic Acid, Venous: 1.9 mmol/L (ref 0.5–1.9)

## 2024-05-15 LAB — HEMOGLOBIN A1C
Hgb A1c MFr Bld: 11.1 % — ABNORMAL HIGH (ref 4.8–5.6)
Mean Plasma Glucose: 271.87 mg/dL

## 2024-05-15 LAB — PREALBUMIN: Prealbumin: <5 mg/dL — ABNORMAL LOW (ref 18–38)

## 2024-05-15 LAB — BETA-HYDROXYBUTYRIC ACID
Beta-Hydroxybutyric Acid: 3.01 mmol/L — ABNORMAL HIGH (ref 0.05–0.27)
Beta-Hydroxybutyric Acid: 0.62 mmol/L — ABNORMAL HIGH (ref 0.05–0.27)

## 2024-05-15 LAB — MRSA NEXT GEN BY PCR, NASAL: MRSA by PCR Next Gen: NOT DETECTED

## 2024-05-15 LAB — SEDIMENTATION RATE: Sed Rate: 126 mm/h — ABNORMAL HIGH (ref 0–20)

## 2024-05-15 LAB — LIPASE, BLOOD: Lipase: 24 U/L (ref 11–51)

## 2024-05-15 SURGERY — IRRIGATION AND DEBRIDEMENT FOOT
Anesthesia: General | Site: Foot | Laterality: Left

## 2024-05-15 MED ORDER — BUPIVACAINE HCL (PF) 0.5 % IJ SOLN
INTRAMUSCULAR | Status: AC
  Filled 2024-05-15: qty 30

## 2024-05-15 MED ORDER — VANCOMYCIN HCL 1750 MG/350ML IV SOLN
1750.0000 mg | Freq: Once | INTRAVENOUS | Status: AC
  Administered 2024-05-15: 1750 mg via INTRAVENOUS
  Filled 2024-05-15: qty 350

## 2024-05-15 MED ORDER — MIDAZOLAM HCL 2 MG/2ML IJ SOLN
INTRAMUSCULAR | Status: DC | PRN
  Administered 2024-05-15: 2 mg via INTRAVENOUS

## 2024-05-15 MED ORDER — ORAL CARE MOUTH RINSE: 15.0000 mL | OROMUCOSAL | Status: DC | PRN

## 2024-05-15 MED ORDER — ONDANSETRON HCL 4 MG PO TABS: 4.0000 mg | ORAL_TABLET | Freq: Four times a day (QID) | ORAL | Status: DC | PRN

## 2024-05-15 MED ORDER — DIAZEPAM 5 MG/ML IJ SOLN
2.5000 mg | Freq: Once | INTRAMUSCULAR | Status: AC
  Administered 2024-05-15: 2.5 mg via INTRAVENOUS
  Filled 2024-05-15: qty 2

## 2024-05-15 MED ORDER — FENTANYL CITRATE (PF) 100 MCG/2ML IJ SOLN
INTRAMUSCULAR | Status: AC
Start: 2024-05-15 — End: 2024-05-15
  Filled 2024-05-15: qty 2

## 2024-05-15 MED ORDER — TOBRAMYCIN SULFATE 80 MG/2ML IJ SOLN
INTRAMUSCULAR | Status: AC
  Filled 2024-05-15: qty 2

## 2024-05-15 MED ORDER — LINEZOLID 600 MG/300ML IV SOLN
600.0000 mg | Freq: Two times a day (BID) | INTRAVENOUS | Status: AC
  Administered 2024-05-15 – 2024-05-18 (×7): 600 mg via INTRAVENOUS
  Filled 2024-05-15 (×7): qty 300

## 2024-05-15 MED ORDER — STERILE WATER FOR IRRIGATION IR SOLN
Status: DC | PRN
  Administered 2024-05-15: 3000 mL

## 2024-05-15 MED ORDER — SUCCINYLCHOLINE CHLORIDE 200 MG/10ML IV SOSY
PREFILLED_SYRINGE | INTRAVENOUS | Status: DC | PRN
Start: 2024-05-15 — End: 2024-05-15
  Administered 2024-05-15: 120 mg via INTRAVENOUS

## 2024-05-15 MED ORDER — LIDOCAINE HCL (PF) 1 % IJ SOLN
INTRAMUSCULAR | Status: AC
  Filled 2024-05-15: qty 30

## 2024-05-15 MED ORDER — LABETALOL HCL 5 MG/ML IV SOLN
INTRAVENOUS | Status: DC | PRN
Start: 2024-05-15 — End: 2024-05-15
  Administered 2024-05-15: 5 mg via INTRAVENOUS

## 2024-05-15 MED ORDER — LACTATED RINGERS IV SOLN: INTRAVENOUS | Status: DC

## 2024-05-15 MED ORDER — PIPERACILLIN-TAZOBACTAM 4.5 G IVPB
4.5000 g | Freq: Once | INTRAVENOUS | Status: AC
  Administered 2024-05-15: 4.5 g via INTRAVENOUS
  Filled 2024-05-15: qty 100

## 2024-05-15 MED ORDER — LABETALOL HCL 5 MG/ML IV SOLN
INTRAVENOUS | Status: AC
  Filled 2024-05-15: qty 4

## 2024-05-15 MED ORDER — LIDOCAINE HCL 1 % IJ SOLN
INTRAMUSCULAR | Status: DC | PRN
  Administered 2024-05-15: 10 mL via INTRAMUSCULAR

## 2024-05-15 MED ORDER — VANCOMYCIN HCL 1000 MG IV SOLR
INTRAVENOUS | Status: DC | PRN
  Administered 2024-05-15: 500 mg

## 2024-05-15 MED ORDER — GADOBUTROL 1 MMOL/ML IV SOLN
10.0000 mL | Freq: Once | INTRAVENOUS | Status: AC | PRN
  Administered 2024-05-15: 10 mL via INTRAVENOUS

## 2024-05-15 MED ORDER — SODIUM CHLORIDE 0.9 % IV BOLUS
1000.0000 mL | Freq: Once | INTRAVENOUS | Status: AC
  Administered 2024-05-15: 1000 mL via INTRAVENOUS

## 2024-05-15 MED ORDER — TETANUS-DIPHTH-ACELL PERTUSSIS 5-2.5-18.5 LF-MCG/0.5 IM SUSY
0.5000 mL | PREFILLED_SYRINGE | Freq: Once | INTRAMUSCULAR | Status: DC
  Filled 2024-05-15: qty 0.5

## 2024-05-15 MED ORDER — ONDANSETRON HCL 4 MG/2ML IJ SOLN
INTRAMUSCULAR | Status: AC
  Filled 2024-05-15: qty 2

## 2024-05-15 MED ORDER — DEXTROSE IN LACTATED RINGERS 5 % IV SOLN: INTRAVENOUS | Status: DC

## 2024-05-15 MED ORDER — HEPARIN SODIUM (PORCINE) 5000 UNIT/ML IJ SOLN
5000.0000 [IU] | Freq: Three times a day (TID) | INTRAMUSCULAR | Status: DC
  Administered 2024-05-15 – 2024-05-21 (×18): 5000 [IU] via SUBCUTANEOUS
  Filled 2024-05-15 (×18): qty 1

## 2024-05-15 MED ORDER — INSULIN ASPART 100 UNIT/ML IJ SOLN
INTRAMUSCULAR | Status: AC
  Filled 2024-05-15: qty 1

## 2024-05-15 MED ORDER — ACETAMINOPHEN 650 MG RE SUPP: 650.0000 mg | Freq: Four times a day (QID) | RECTAL | Status: DC | PRN

## 2024-05-15 MED ORDER — SODIUM CHLORIDE 0.9 % IV SOLN
3.0000 g | Freq: Three times a day (TID) | INTRAVENOUS | Status: DC
  Administered 2024-05-15 – 2024-05-16 (×3): 3 g via INTRAVENOUS
  Filled 2024-05-15 (×3): qty 8

## 2024-05-15 MED ORDER — DEXTROSE 50 % IV SOLN: 0.0000 mL | INTRAVENOUS | Status: DC | PRN

## 2024-05-15 MED ORDER — PANTOPRAZOLE SODIUM 40 MG IV SOLR
40.0000 mg | INTRAVENOUS | Status: AC
  Administered 2024-05-15 – 2024-05-16 (×2): 40 mg via INTRAVENOUS
  Filled 2024-05-15 (×2): qty 10

## 2024-05-15 MED ORDER — VANCOMYCIN HCL 1000 MG IV SOLR
INTRAVENOUS | Status: AC
  Filled 2024-05-15: qty 20

## 2024-05-15 MED ORDER — INSULIN ASPART 100 UNIT/ML IJ SOLN
15.0000 [IU] | Freq: Once | INTRAMUSCULAR | Status: AC
  Administered 2024-05-15: 15 [IU] via SUBCUTANEOUS
  Filled 2024-05-15: qty 1

## 2024-05-15 MED ORDER — CHLORHEXIDINE GLUCONATE CLOTH 2 % EX PADS
6.0000 | MEDICATED_PAD | Freq: Every day | CUTANEOUS | Status: DC
  Administered 2024-05-15 – 2024-05-21 (×6): 6 via TOPICAL

## 2024-05-15 MED ORDER — 0.9 % SODIUM CHLORIDE (POUR BTL) OPTIME
TOPICAL | Status: DC | PRN
  Administered 2024-05-15: 500 mL

## 2024-05-15 MED ORDER — ONDANSETRON HCL 4 MG/2ML IJ SOLN
4.0000 mg | Freq: Once | INTRAMUSCULAR | Status: AC
  Administered 2024-05-15: 4 mg via INTRAVENOUS

## 2024-05-15 MED ORDER — INSULIN REGULAR(HUMAN) IN NACL 100-0.9 UT/100ML-% IV SOLN: INTRAVENOUS | Status: DC

## 2024-05-15 MED ORDER — INSULIN ASPART 100 UNIT/ML IJ SOLN
0.0000 [IU] | INTRAMUSCULAR | Status: DC
  Administered 2024-05-15 (×2): 15 [IU] via SUBCUTANEOUS
  Administered 2024-05-16: 7 [IU] via SUBCUTANEOUS
  Filled 2024-05-15 (×3): qty 1

## 2024-05-15 MED ORDER — DROPERIDOL 2.5 MG/ML IJ SOLN: 0.6250 mg | Freq: Once | INTRAMUSCULAR | Status: DC | PRN

## 2024-05-15 MED ORDER — INSULIN ASPART 100 UNIT/ML IJ SOLN
10.0000 [IU] | Freq: Once | INTRAMUSCULAR | Status: AC
  Administered 2024-05-15: 10 [IU] via SUBCUTANEOUS

## 2024-05-15 MED ORDER — ENSURE PLUS HIGH PROTEIN PO LIQD
237.0000 mL | Freq: Two times a day (BID) | ORAL | Status: DC
  Administered 2024-05-16: 237 mL via ORAL

## 2024-05-15 MED ORDER — VANCOMYCIN HCL 10 G IV SOLR: 15.0000 mg/kg | Freq: Once | INTRAVENOUS | Status: DC

## 2024-05-15 MED ORDER — PNEUMOCOCCAL 20-VAL CONJ VACC 0.5 ML IM SUSY: 0.5000 mL | PREFILLED_SYRINGE | INTRAMUSCULAR | Status: DC

## 2024-05-15 MED ORDER — ONDANSETRON HCL 4 MG/2ML IJ SOLN
4.0000 mg | Freq: Four times a day (QID) | INTRAMUSCULAR | Status: DC | PRN
  Administered 2024-05-15 (×2): 4 mg via INTRAVENOUS
  Filled 2024-05-15 (×2): qty 2

## 2024-05-15 MED ORDER — MORPHINE SULFATE (PF) 2 MG/ML IV SOLN: 2.0000 mg | INTRAVENOUS | Status: DC | PRN

## 2024-05-15 MED ORDER — PROPOFOL 10 MG/ML IV BOLUS
INTRAVENOUS | Status: DC | PRN
  Administered 2024-05-15: 170 mg via INTRAVENOUS

## 2024-05-15 MED ORDER — FENTANYL CITRATE (PF) 100 MCG/2ML IJ SOLN: INTRAMUSCULAR | Status: DC | PRN

## 2024-05-15 MED ORDER — INSULIN ASPART 100 UNIT/ML IJ SOLN
5.0000 [IU] | Freq: Once | INTRAMUSCULAR | Status: AC
  Administered 2024-05-15: 5 [IU] via INTRAVENOUS
  Filled 2024-05-15: qty 1

## 2024-05-15 MED ORDER — MIDAZOLAM HCL 2 MG/2ML IJ SOLN
INTRAMUSCULAR | Status: AC
  Filled 2024-05-15: qty 2

## 2024-05-15 MED ORDER — POTASSIUM CHLORIDE 10 MEQ/100ML IV SOLN: 10.0000 meq | INTRAVENOUS | Status: AC

## 2024-05-15 MED ORDER — POTASSIUM CHLORIDE 20 MEQ PO PACK
40.0000 meq | PACK | Freq: Once | ORAL | Status: AC
  Administered 2024-05-15: 40 meq via ORAL
  Filled 2024-05-15: qty 2

## 2024-05-15 MED ORDER — FENTANYL CITRATE (PF) 100 MCG/2ML IJ SOLN: 25.0000 ug | INTRAMUSCULAR | Status: DC | PRN

## 2024-05-15 MED ORDER — ACETAMINOPHEN 325 MG PO TABS
650.0000 mg | ORAL_TABLET | Freq: Four times a day (QID) | ORAL | Status: DC | PRN
  Administered 2024-05-15 – 2024-05-17 (×4): 650 mg via ORAL
  Filled 2024-05-15 (×4): qty 2

## 2024-05-15 MED ORDER — FENTANYL CITRATE (PF) 100 MCG/2ML IJ SOLN
INTRAMUSCULAR | Status: DC | PRN
  Administered 2024-05-15: 25 ug via INTRAVENOUS
  Administered 2024-05-15: 50 ug via INTRAVENOUS
  Administered 2024-05-15: 25 ug via INTRAVENOUS

## 2024-05-15 MED ORDER — DEXAMETHASONE SODIUM PHOSPHATE 10 MG/ML IJ SOLN
INTRAMUSCULAR | Status: DC | PRN
  Administered 2024-05-15: 5 mg via INTRAVENOUS

## 2024-05-15 MED ORDER — SODIUM CHLORIDE 0.9 % IV SOLN: INTRAVENOUS | Status: DC | PRN

## 2024-05-15 MED ORDER — TOBRAMYCIN SULFATE 80 MG/2ML IJ SOLN
INTRAMUSCULAR | Status: DC | PRN
  Administered 2024-05-15: 120 mg via INTRAMUSCULAR

## 2024-05-15 SURGICAL SUPPLY — 54 items
BLADE MED AGGRESSIVE (BLADE) IMPLANT
BNDG COHESIVE 4X5 TAN STRL LF (GAUZE/BANDAGES/DRESSINGS) ×1 IMPLANT
BNDG ELASTIC 4INX 5YD STR LF (GAUZE/BANDAGES/DRESSINGS) ×1 IMPLANT
BNDG ESMARCH 4X12 STRL LF (GAUZE/BANDAGES/DRESSINGS) ×1 IMPLANT
BNDG GAUZE DERMACEA FLUFF 4 (GAUZE/BANDAGES/DRESSINGS) ×1 IMPLANT
BNDG STRETCH GAUZE 3IN X12FT (GAUZE/BANDAGES/DRESSINGS) ×1 IMPLANT
CANISTER WOUND CARE 500ML ATS (WOUND CARE) IMPLANT
CNTNR URN SCR LID CUP LEK RST (MISCELLANEOUS) IMPLANT
CUFF TOURN SGL QUICK 18X4 (TOURNIQUET CUFF) IMPLANT
CUFF TRNQT CYL 24X4X16.5-23 (TOURNIQUET CUFF) IMPLANT
DRSG EMULSION OIL 3X8 NADH (GAUZE/BANDAGES/DRESSINGS) ×1 IMPLANT
DRSG VAC GRANUFOAM MED (GAUZE/BANDAGES/DRESSINGS) IMPLANT
DURAPREP 26ML APPLICATOR (WOUND CARE) ×1 IMPLANT
ELECTRODE REM PT RTRN 9FT ADLT (ELECTROSURGICAL) ×1 IMPLANT
GAUZE PACKING 0.25INX5YD STRL (GAUZE/BANDAGES/DRESSINGS) IMPLANT
GAUZE SPONGE 4X4 12PLY STRL (GAUZE/BANDAGES/DRESSINGS) ×1 IMPLANT
GAUZE STRETCH 2X75IN STRL (MISCELLANEOUS) ×1 IMPLANT
GAUZE XEROFORM 1X8 LF (GAUZE/BANDAGES/DRESSINGS) IMPLANT
GLOVE BIOGEL PI IND STRL 7.5 (GLOVE) ×1 IMPLANT
GLOVE SURG SYN 7.5 PF PI (GLOVE) ×1 IMPLANT
GOWN STRL REUS W/ TWL XL LVL3 (GOWN DISPOSABLE) ×1 IMPLANT
GOWN STRL REUS W/TWL MED LVL3 (GOWN DISPOSABLE) ×1 IMPLANT
HANDPIECE VERSAJET DEBRIDEMENT (MISCELLANEOUS) IMPLANT
IV NS 1000ML BAXH (IV SOLUTION) ×1 IMPLANT
KIT STIMULAN RAPID CURE 5CC (Orthopedic Implant) IMPLANT
KIT TURNOVER KIT A (KITS) ×1 IMPLANT
LABEL OR SOLS (LABEL) ×1 IMPLANT
MANIFOLD NEPTUNE II (INSTRUMENTS) ×1 IMPLANT
NDL BIOPSY JAMSHIDI 11X6 (NEEDLE) IMPLANT
NDL FILTER BLUNT 18X1 1/2 (NEEDLE) ×1 IMPLANT
NDL HYPO 25X1 1.5 SAFETY (NEEDLE) ×1 IMPLANT
NEEDLE BIOPSY JAMSHIDI 11X6 (NEEDLE) ×1 IMPLANT
NEEDLE FILTER BLUNT 18X1 1/2 (NEEDLE) ×1 IMPLANT
NEEDLE HYPO 25X1 1.5 SAFETY (NEEDLE) ×1 IMPLANT
NS IRRIG 500ML POUR BTL (IV SOLUTION) ×1 IMPLANT
PACK EXTREMITY ARMC (MISCELLANEOUS) ×1 IMPLANT
PACKING GAUZE IODOFORM 1INX5YD (GAUZE/BANDAGES/DRESSINGS) IMPLANT
PAD ABD DERMACEA PRESS 5X9 (GAUZE/BANDAGES/DRESSINGS) ×1 IMPLANT
PAD PREP OB/GYN DISP 24X41 (PERSONAL CARE ITEMS) ×1 IMPLANT
PENCIL SMOKE EVACUATOR (MISCELLANEOUS) ×1 IMPLANT
SOL .9 NS 3000ML IRR UROMATIC (IV SOLUTION) IMPLANT
SOLUTION PREP PVP 2OZ (MISCELLANEOUS) ×1 IMPLANT
STAPLER SKIN PROX 35W (STAPLE) ×1 IMPLANT
STOCKINETTE 48X4 2 PLY STRL (GAUZE/BANDAGES/DRESSINGS) ×1 IMPLANT
STOCKINETTE IMPERVIOUS 9X36 MD (GAUZE/BANDAGES/DRESSINGS) ×1 IMPLANT
STOCKINETTE STRL 4IN 9604848 (GAUZE/BANDAGES/DRESSINGS) ×1 IMPLANT
SUT PROLENE 3 0 PS 2 (SUTURE) IMPLANT
SUTURE ETHLN 4-0 FS2 18XMF BLK (SUTURE) IMPLANT
SWAB CULTURE AMIES ANAERIB BLU (MISCELLANEOUS) IMPLANT
SYR 10ML LL (SYRINGE) ×2 IMPLANT
TIP FAN IRRIG PULSAVAC PLUS (DISPOSABLE) IMPLANT
TRAP FLUID SMOKE EVACUATOR (MISCELLANEOUS) ×1 IMPLANT
WATER STERILE IRR 3000ML UROMA (IV SOLUTION) IMPLANT
WATER STERILE IRR 500ML POUR (IV SOLUTION) ×1 IMPLANT

## 2024-05-15 NOTE — Anesthesia Procedure Notes (Signed)
 Procedure Name: Intubation Date/Time: 05/15/2024 5:15 PM  Performed by: Lorriane Arabia, CRNAPre-anesthesia Checklist: Patient identified, Patient being monitored, Timeout performed, Emergency Drugs available and Suction available Patient Re-evaluated:Patient Re-evaluated prior to induction Oxygen Delivery Method: Circle system utilized Preoxygenation: Pre-oxygenation with 100% oxygen Induction Type: IV induction Ventilation: Mask ventilation without difficulty Laryngoscope Size: Mac and 3 Grade View: Grade III Tube type: Oral Tube size: 7.0 mm Number of attempts: 1 Airway Equipment and Method: Stylet Placement Confirmation: ETT inserted through vocal cords under direct vision, positive ETCO2 and breath sounds checked- equal and bilateral Secured at: 27 cm Tube secured with: Tape Dental Injury: Teeth and Oropharynx as per pre-operative assessment

## 2024-05-15 NOTE — Assessment & Plan Note (Signed)
 Present on admission secondary to infected diabetic foot wound As evidenced by fever with a Tmax of 101.3, tachycardia, tachypnea, marked leukocytosis Will place patient empirically on linezolid  and Unasyn  Follow-up results of blood cultures Continue aggressive IV fluid resuscitation Consult podiatry for diabetic foot wound

## 2024-05-15 NOTE — ED Triage Notes (Signed)
 C/O N/V Abdominal pain x 2 days.  States tolerating water , but not much else.  Hx DM, has not been checking CBG.

## 2024-05-15 NOTE — Assessment & Plan Note (Signed)
 Noted to have a drop in his H&H from 15g/dl >> 89h/io No obvious source of blood loss at this time Obtain iron studies Monitor H&H closely

## 2024-05-15 NOTE — Assessment & Plan Note (Signed)
 Patient noted to have a wound involving the left lateral foot with foul-smelling drainage X-ray of the left foot shows soft tissue thickening and subcutaneous emphysema along the lateral mid and plantar hindfoot, consistent with known ulcer. No radiographic evidence of osteomyelitis. Will obtain MRI of the foot for further evaluation Consult podiatry Continue empiric antibiotic therapy with Unasyn  and linezolid 

## 2024-05-15 NOTE — Assessment & Plan Note (Signed)
 Patient with class I obesity, BMI of 33 Complicates overall prognosis and care Lifestyle modification and exercise has been discussed with patient in detail

## 2024-05-15 NOTE — ED Provider Notes (Signed)
 [facility] Provider Note    Event Date/Time   First MD Initiated Contact with Patient 05/15/24 1159     (approximate)   History   Abdominal Pain   HPI  Clayton [name] is a 53 y.o. male poorly controlled type 2 diabetes, hypertension and CKD who presents to the emergency department with intractable nausea vomiting.  Patient reports that his nausea and vomiting initiated 5 days ago.  He has abdominal pain with each episode of emesis but denies any abdominal pain currently.  He states that a week and a half ago he was ambulating and stepped on an object.  He has not checked his foot regularly but noticed a foul smell.  Today he noticed a large ulceration.  He has not checked his glucoses or given himself any insulin  for the past 5 days as he was too weak from vomiting.  He denies any chest pain shortness of breath substance use or changes in urinary or bowel habits      Physical Exam   Triage Vital Signs: ED Triage Vitals  Encounter Vitals Group     BP 05/15/24 1144 (!) 165/105     Girls Systolic BP Percentile --      Girls Diastolic BP Percentile --      Boys Systolic BP Percentile --      Boys Diastolic BP Percentile --      Pulse Rate 05/15/24 1144 (!) 109     Resp 05/15/24 1144 18     Temp 05/15/24 1144 99.4 F (37.4 C)     Temp Source 05/15/24 1144 Oral     SpO2 05/15/24 1144 97 %     Weight 05/15/24 1145 260 lb 2.3 oz (118 kg)     Height --      Head Circumference --      Peak Flow --      Pain Score 05/15/24 1144 8     Pain Loc --      Pain Education --      Exclude from Growth Chart --     Most recent vital signs: Vitals:   05/15/24 1144  BP: (!) 165/105  Pulse: (!) 109  Resp: 18  Temp: 99.4 F (37.4 C)  SpO2: 97%    Nursing Triage Note reviewed. Vital signs reviewed and patients oxygen saturation is normoxic  General: Patient is well nourished, well developed, awake and alert,appears unwell Head: Normocephalic and  atraumatic Eyes: Normal inspection, extraocular muscles intact, no conjunctival pallor Ear, nose, throat: Normal external exam Neck: Normal range of motion Respiratory: Patient is in no respiratory distress, lungs CTAB Cardiovascular: Patient is tachycardic, RR  GI: Abd SNT with no guarding or rebound  Back: Normal inspection of the back with good strength and range of motion throughout all ext Extremities: pulses intact with good cap refills, no LE pitting edema or calf tenderness Patient has large ulceration with exposure of subcutaneous tissue on the sole of his left foot.  I do not palpate any crepitus around the area or on top of his foot Neuro: The patient is alert and oriented to person, place, and time, appropriately conversive, with 5/5 bilat UE/LE strength, no gross motor or sensory defects noted. Coordination appears to be adequate. Skin: Warm, dry, and intact Psych: normal mood and affect, no SI or HI  ED Results / Procedures / Treatments   Labs (all labs ordered are listed, but only abnormal results are displayed) Labs Reviewed  COMPREHENSIVE METABOLIC PANEL  WITH GFR - Abnormal; Notable for the following components:      Result Value   Sodium 128 (*)    Chloride 90 (*)    CO2 20 (*)    Glucose, Bld 447 (*)    BUN 25 (*)    Creatinine, Ser 2.56 (*)    Calcium  8.7 (*)    Albumin 2.7 (*)    Total Bilirubin 2.2 (*)    GFR, Estimated 29 (*)    Anion gap 18 (*)    All other components within normal limits  CBC - Abnormal; Notable for the following components:   WBC 23.2 (*)    RBC 3.62 (*)    Hemoglobin 10.5 (*)    HCT 32.6 (*)    All other components within normal limits  URINALYSIS, ROUTINE W REFLEX MICROSCOPIC - Abnormal; Notable for the following components:   Color, Urine YELLOW (*)    APPearance CLOUDY (*)    Glucose, UA >=500 (*)    Hgb urine dipstick MODERATE (*)    Ketones, ur 5 (*)    Protein, ur >=300 (*)    Bacteria, UA RARE (*)    All other  components within normal limits  BLOOD GAS, VENOUS - Abnormal; Notable for the following components:   pCO2, Ven 38 (*)    All other components within normal limits  CULTURE, BLOOD (ROUTINE X 2)  CULTURE, BLOOD (ROUTINE X 2)  LIPASE, BLOOD  LACTIC ACID, PLASMA  URINE DRUG SCREEN, QUALITATIVE ([facility] ONLY)  BETA-HYDROXYBUTYRIC ACID  HEMOGLOBIN A1C  BASIC METABOLIC PANEL WITH GFR     EKG EKG and rhythm strip are interpreted by myself:   EKG: [tachycardic sinus rhythm] at heart rate of 110, normal QRS duration, QTc 463, nonspecific ST segments and T waves no ectopy EKG not consistent with Acute STEMI Rhythm strip: Sinus tachycardia in lead II   RADIOLOGY Xray of left foot: Large ulceration with possible gas tracking    PROCEDURES:  Critical Care performed: Yes, see critical care procedure note(s)  .Critical Care  Performed by: Nicholaus Rolland BRAVO, MD Authorized by: Nicholaus Rolland BRAVO, MD   Critical care provider statement:    Critical care time (minutes):  35   Critical care was necessary to treat or prevent imminent or life-threatening deterioration of the following conditions:  Sepsis   Critical care was time spent personally by me on the following activities:  Development of treatment plan with patient or surrogate, discussions with consultants, evaluation of patient's response to treatment, examination of patient, ordering and review of laboratory studies, ordering and review of radiographic studies, ordering and performing treatments and interventions, pulse oximetry, re-evaluation of patient's condition and review of old charts   Care discussed with: admitting provider   Comments:     Sepsis secondary to possible necrotizing infection, possible DKA    MEDICATIONS ORDERED IN ED: Medications  piperacillin -tazobactam (ZOSYN ) IVPB 4.5 g (4.5 g Intravenous New Bag/Given 05/15/24 1511)  sodium chloride  0.9 % bolus 1,000 mL (1,000 mLs Intravenous New Bag/Given 05/15/24 1307)   vancomycin  (VANCOREADY) IVPB 1750 mg/350 mL (1,750 mg Intravenous New Bag/Given 05/15/24 1324)  ondansetron  (ZOFRAN ) injection 4 mg (4 mg Intravenous Given 05/15/24 1311)  diazepam  (VALIUM ) injection 2.5 mg (2.5 mg Intravenous Given 05/15/24 1400)  sodium chloride  0.9 % bolus 1,000 mL (1,000 mLs Intravenous New Bag/Given 05/15/24 1511)  insulin  aspart (novoLOG ) injection 5 Units (5 Units Intravenous Given 05/15/24 1509)     IMPRESSION / MDM / ASSESSMENT AND PLAN /  ED COURSE                                Differential diagnosis includes, but is not limited to DKA, sepsis, necrotizing infection, diabetic ulcer, electrolyte derangement  ED course: Patient arrives with the chief recorded complaint of abdominal pain however patient denies any abdominal pain on my initial assessment.  His abdominal exam is completely benign.  I was surprised by the large ulceration in his left foot.  He does not have evidence of a necrotizing infection on exam however x-ray did demonstrate gas tracking.  Broad-spectrum antibiotics were given.  Patient's glucose did measure high and he was ordered 2 L of IV fluid.  Patient is not acidotic on the VBG and patient's CO2 is only borderline low.  I do think he is possibly teetering on the edge but given that he is a type II diabetic suspect this could be corrected with IV fluids and a small bolus of insulin  and can likely avoid an insulin  drip.  Case was discussed with Dr. Malvin and plan for operative intervention in the OR at 5 PM today.  Case discussed with hospitalist for admission   Clinical Course as of 05/15/24 1532  Mon May 15, 2024  1346 pH, Ven: 7.39 Does not appear to be consistent with DKA [HD]  1347 Anion gap(!): 18 Only very mildly elevated [HD]  1422 Dr. Ashley did call back, recommends broad-spectrum antibiotics and MRI of the foot.  Unfortunately he is not on for unassigned and so we will call the other group [HD]  1426 Dr. Malvin reviewed the  images and the x-ray with me.  States that he should get an MRI.  He thinks that this is secondary to ulceration but there is still a small possibility of necrotizing infection.  He does not recommend calling vascular surgery at this time but waiting for the MRI as it is likely nonsalvageable and he will need an amputation anyway.  Agrees of hospitalist admission [HD]  1431 Glucose(!): 447 Elevated we will give insulin  [HD]  1502 Dr. Malvin would like this patient to remain n.p.o. as there is a possibility that he will take him to operating room today [HD]  1526 Dr. Malvin states that he will take this patient to the operating room at 5 PM [HD]  1527 Patient updated and agreeable with the plan [HD]    Clinical Course User Index [HD] Nicholaus Rolland BRAVO, MD     FINAL CLINICAL IMPRESSION(S) / ED DIAGNOSES   Final diagnoses:  Ulcer of left foot with fat layer exposed (HCC)  Sepsis, due to unspecified organism, unspecified whether acute organ dysfunction present (HCC)  Hyperglycemia  Non compliance w medication regimen  Nausea and vomiting, unspecified vomiting type     Rx / DC Orders   ED Discharge Orders     None        Note:  This document was prepared using Dragon voice recognition software and may include unintentional dictation errors.   Nicholaus Rolland BRAVO, MD 05/15/24 [phone]

## 2024-05-15 NOTE — Assessment & Plan Note (Signed)
 Will continue amlodipine  and carvedilol 

## 2024-05-15 NOTE — Assessment & Plan Note (Addendum)
 Present on admission and secondary to noncompliance to prescribed insulin  Noted to have an anion gap of 18 and hyperglycemia with serum glucose greater than 400 Continue aggressive IV fluid resuscitation Start patient on an insulin  drip per protocol Keep n.p.o. for now due to refractory nausea and vomiting  Check serial BMP and beta hydroxybutyric acid levels Check and supplement electrolytes

## 2024-05-15 NOTE — Assessment & Plan Note (Signed)
 Noted to have stage IV chronic kidney disease secondary to uncontrolled diabetes mellitus Renal function appears stable at this time We will monitor closely and will need referral to nephrology as an outpatient

## 2024-05-15 NOTE — Assessment & Plan Note (Signed)
 Secondary to hyperglycemia related to DKA Expect improvement with IV fluid hydration

## 2024-05-15 NOTE — Progress Notes (Signed)
 Per Michelene, RN, patient's wife, [name], stopped by room while this RN and patient were in MRI and took patient's cell phone home for safe keeping.  Arlean FORBES Bowers, RN

## 2024-05-15 NOTE — Consult Note (Signed)
 PODIATRY CONSULTATION  NAME Clayton [name] MRN [id number] DOB [birth year] DOA 05/15/2024   Reason for consult: Ulceration of left foot  Attending/Consulting physician: IVAR Somerset MD  History of present illness: Clayton [name] is a 53 y.o. 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  Patient states that he has had symptoms for about a week and has not been able to tolerate any oral intake.  He has not taken his insulin  in 1 week due to the fact that his oral intake has been poor.  He complains about pain mostly in the periumbilical area.  He rates his pain a 7 x 10 in intensity at its worst.  Pain is nonradiating.  He denies having any diarrhea.  Emesis is mostly fluid and bile.  Has not contained any blood or coffee-ground. He states that about a week and half ago he had stepped on an object and developed a blister involving his left foot.  He notes that the blister popped and he now has a large ulceration involving the left lateral portion of his left foot with foul-smelling drainage.  He denies having any fever or chills. He denies having any chest pain, no shortness of breath, no headache, no dizziness, no lightheadedness, no blurred vision, no urinary symptoms, no focal deficit. Abnormal labs include a sodium of 128, glucose 447, BUN 25, creatinine 2.56, total bilirubin 2.2, albumin 2.7, white count 23.2, hemoglobin 10.5  Discussed with the patient regarding the nature of the wound.  He says it has been present for approximately 2 weeks.  He has not been taking his insulin  for at least a week due to nausea and vomiting.  He is not sure how the wound started.  He heard a pop when he was eating and now has a large ulceration with drainage.  He does report a history of  dropfoot in the left lower extremity due to a football injury many years ago.  He does have sensation however in the foot to the touch.  And the wound does hurt.  He is not sure of his most recent A1c level he says it has been up-and-down in the past.  Past Medical History:  Diagnosis Date   Asthma    CKD (chronic kidney disease), stage III (HCC)    Hypertension    Type II diabetes mellitus with renal manifestations (HCC)        Latest Ref Rng & Units 05/15/2024   11:55 AM 09/28/2023    5:47 AM 09/27/2023    8:24 PM  CBC  WBC 4.0 - 10.5 K/uL 23.2  12.5  19.3   Hemoglobin 13.0 - 17.0 g/dL 89.4  84.6  84.0   Hematocrit 39.0 - 52.0 % 32.6  45.2  45.6   Platelets 150 - 400 K/uL 368  290  282        Latest Ref Rng & Units 05/15/2024   11:55 AM 09/28/2023    5:47 AM 09/27/2023    8:24 PM  BMP  Glucose 70 - 99 mg/dL 70 - 99 mg/dL 552    577  767  722   BUN 6 - 20 mg/dL 6 - 20 mg/dL 25    26  42  43   Creatinine 0.61 - 1.24 mg/dL 9.38 - 8.75 mg/dL 7.43  2.53  2.58  3.00   Sodium 135 - 145 mmol/L 135 - 145 mmol/L 128    129  134  133   Potassium 3.5 - 5.1 mmol/L 3.5 - 5.1 mmol/L 4.6    4.3  4.3  4.2   Chloride 98 - 111 mmol/L 98 - 111 mmol/L 90    93  102  98   CO2 22 - 32 mmol/L 22 - 32 mmol/L 20    22  21  25    Calcium  8.9 - 10.3 mg/dL 8.9 - 89.6 mg/dL 8.7    8.2  9.4  9.5       Physical Exam: Lower Extremity Exam  DP and PT pulses are palpable 2+  Sensation is reduced though intact around wound and there is pain on palpation of the area  Significant edema pitting 2+ to the left lower extremity.  No obvious crepitus noted on exam  There is a large circular ulceration with necrotic and fibrotic tissues present in wound bed purulence and foul smelling odor.       ASSESSMENT/PLAN OF CARE 53 y.o. male with PMHx significant for  diabetes mellitus with complications of stage IV chronic kidney disease, hypertension, obesity with a BMI of 33.40  with large  ulceration with underlying subcutaneous emphysema of the left foot.  Concern for possible underlying osteomyelitis of the midfoot including the cuboid.  WBC 23 ESR and CRP pending A1c pending  XR left foot:Soft tissue thickening and subcutaneous emphysema along the lateral mid and plantar hindfoot, consistent with known ulcer. No radiographic evidence of osteomyelitis.  -NPO for OR today given the appearance of the ulceration as well as underlying subcutaneous emphysema I recommended we proceed with I&D with bone biopsy of the cuboid and antibiotic bead and wound VAC application.  Patient was agreeable to proceed. - did discuss with the patient regarding the severity of the ulceration and possibility of underlying bone infection.  He is at high risk for limb loss. -Will consider MRI postoperatively.  Likely will obtain this - Continue IV abx broad spectrum pending further culture data, recommend ID consultation tomorrow - Anticoagulation: Hold postop given wound VAC and open wound - Wound care: Leave wound VAC clean dry and intact. - WB status: Nonweightbearing to the left foot postop - Will continue to follow   Thank you for the consult.  Please contact me directly with any questions or concerns.           Marolyn JULIANNA Honour, DPM [facility]    [address] [place], [place] [address]                Office [phone]  Fax [phone]

## 2024-05-15 NOTE — Transfer of Care (Signed)
 Immediate Anesthesia Transfer of Care Note  Patient: Clayton [name]  Procedure(s) Performed: IRRIGATION AND DEBRIDEMENT FOOT (Left: Foot) APPLICATION, WOUND VAC (Left: Foot)  Patient Location: PACU  Anesthesia Type:General  Level of Consciousness: drowsy and patient cooperative  Airway & Oxygen Therapy: Patient Spontanous Breathing and Patient connected to face mask oxygen  Post-op Assessment: Report given to RN and Post -op Vital signs reviewed and stable  Post vital signs: Reviewed and stable  Last Vitals:  Vitals Value Taken Time  BP 112/69 05/15/24 17:54  Temp 36.4 C 05/15/24 17:54  Pulse 87 05/15/24 18:00  Resp 22 05/15/24 18:00  SpO2 100 % 05/15/24 18:00  Vitals shown include unfiled device data.  Last Pain:  Vitals:   05/15/24 1754  TempSrc:   PainSc: 0-No pain      Patients Stated Pain Goal: 2 (05/15/24 1144)  Complications: No notable events documented.

## 2024-05-15 NOTE — H&P (Signed)
 History and Physical    Patient: Clayton [name] FMW:[id number] DOB: [birth year] DOA: 05/15/2024 DOS: the patient was seen and examined on 05/15/2024 PCP: Arden Fairy Lot, MD  Patient coming from: Home  Chief Complaint:  Chief Complaint  Patient presents with   Abdominal Pain   HPI: Clayton [name] is a 53 y.o. 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  Patient states that he has had symptoms for about a week and has not been able to tolerate any oral intake.  He has not taken his insulin  in 1 week due to the fact that his oral intake has been poor.  He complains about pain mostly in the periumbilical area.  He rates his pain a 7 x 10 in intensity at its worst.  Pain is nonradiating.  He denies having any diarrhea.  Emesis is mostly fluid and bile.  Has not contained any blood or coffee-ground. He states that about a week and half ago he had stepped on an object and developed a blister involving his left foot.  He notes that the blister popped and he now has a large ulceration involving the left lateral portion of his left foot with foul-smelling drainage.  He denies having any fever or chills. He denies having any chest pain, no shortness of breath, no headache, no dizziness, no lightheadedness, no blurred vision, no urinary symptoms, no focal deficit. Abnormal labs include a sodium of 128, glucose 447, BUN 25, creatinine 2.56, total bilirubin 2.2, albumin 2.7, white count 23.2, hemoglobin 10.5 Left foot x-ray shows soft tissue thickening and subcutaneous emphysema along the lateral mid and plantar hindfoot, consistent with known ulcer. No radiographic evidence of osteomyelitis. Patient received 2 L IV fluid bolus in the ER as well as a dose of vancomycin  and  Zosyn . He also received Tdap He will be admitted to the hospital for further evaluation.    Review of Systems: As mentioned in the history of present illness. All other systems reviewed and are negative. Past Medical History:  Diagnosis Date   Asthma    CKD (chronic kidney disease), stage III (HCC)    Hypertension    Type II diabetes mellitus with renal manifestations (HCC)    Past Surgical History:  Procedure Laterality Date   CATARACT EXTRACTION     Social History:  reports that he has never smoked. He has never used smokeless tobacco. He reports current alcohol use. He reports that he does not currently use drugs.  No Known Allergies  Family History  Problem Relation Age of Onset   Stroke Mother    Hypertension Mother    Hypertension Father     Prior to Admission medications   Medication Sig Start Date End Date Taking? Authorizing Provider  albuterol  (VENTOLIN  HFA) 108 (90 Base) MCG/ACT inhaler Inhale 2 puffs into the lungs every 6 (six) hours as needed for wheezing or shortness of breath. 09/28/23  Yes Von Bellis, MD  Continuous Glucose Sensor (FREESTYLE LIBRE 3 SENSOR) MISC :1 Each Every 2 Weeks 12/02/23  Yes [provider]  empagliflozin (JARDIANCE) 25 MG TABS tablet Take 1 tablet by mouth daily. 07/07/23 07/06/24 Yes [provider]  Insulin  Lispro Prot & Lispro (HUMALOG  75/25 MIX) (75-25) 100 UNIT/ML Kwikpen Inject 15 Units into the skin 2 (two) times daily. Can give equivalent if  covered by insurance.  Pls provide needles. 04/25/21 05/15/24 Yes Awanda City, MD  rosuvastatin  (CRESTOR ) 20 MG tablet Take 20 mg by mouth daily. 07/07/23  Yes [provider]  Vitamin D , Ergocalciferol , (DRISDOL) 1.25 MG (50000 UNIT) CAPS capsule Take 50,000 Units by mouth once a week. 10/31/20  Yes [provider]  amLODipine  (NORVASC ) 10 MG tablet Take 1 tablet (10 mg total) by mouth daily. 04/23/21 09/28/23  Awanda City, MD  benzonatate  (TESSALON ) 100 MG capsule  Take 1 capsule (100 mg total) by mouth 3 (three) times daily as needed for cough. Patient not taking: Reported on 05/15/2024 09/28/23   Von Bellis, MD  carvedilol  (COREG ) 25 MG tablet Take 1 tablet (25 mg total) by mouth 2 (two) times daily. 04/23/21 09/28/23  Awanda City, MD  loratadine  (CLARITIN ) 10 MG tablet Take 1 tablet (10 mg total) by mouth at bedtime for 10 days. 09/28/23 10/08/23  Von Bellis, MD  losartan  (COZAAR ) 100 MG tablet Take 1 tablet (100 mg total) by mouth daily. 04/23/21 09/28/23  Awanda City, MD  metFORMIN  (GLUMETZA ) 1000 MG (MOD) 24 hr tablet Take 1 tablet (1,000 mg total) by mouth 2 (two) times daily with a meal. Patient not taking: Reported on 05/15/2024 09/28/23 09/27/24  Von Bellis, MD  pantoprazole  (PROTONIX ) 40 MG tablet Take 1 tablet (40 mg total) by mouth daily for 10 days. 09/28/23 10/08/23  Von Bellis, MD  insulin  aspart protamine - aspart (NOVOLOG  70/30 MIX) (70-30) 100 UNIT/ML FlexPen Inject 0.15 mLs (15 Units total) into the skin 2 (two) times daily. 04/25/21 04/25/21  Awanda City, MD    Physical Exam: Vitals:   05/15/24 1144 05/15/24 1145 05/15/24 1546 05/15/24 1548  BP: (!) 165/105   (!) 194/93  Pulse: (!) 109  (!) 105   Resp: 18  18   Temp: 99.4 F (37.4 C)  (!) 101.3 F (38.5 C)   TempSrc: Oral  Oral   SpO2: 97%     Weight:  118 kg     Physical Exam Vitals and nursing note reviewed.  Constitutional:      Comments: Acutely ill-appearing  HENT:     Head: Normocephalic and atraumatic.     Mouth/Throat:     Comments: Dry mucous membranes Eyes:     Comments: Pale conjunctiva  Cardiovascular:     Rate and Rhythm: Tachycardia present.  Pulmonary:     Effort: Pulmonary effort is normal.     Breath sounds: Normal breath sounds.  Abdominal:     General: Bowel sounds are normal.     Tenderness: There is abdominal tenderness in the periumbilical area.     Comments: Central adiposity, mild tenderness in the periumbilical area, nondistended  Skin:     General: Skin is warm and dry.  Neurological:     Motor: Weakness present.  Psychiatric:        Mood and Affect: Mood normal.        Behavior: Behavior normal.     Data Reviewed: Relevant notes from primary care and specialist visits, past discharge summaries as available in EHR, including Care Everywhere. Prior diagnostic testing as pertinent to current admission diagnoses Updated medications and problem lists for reconciliation ED course, including vitals, labs, imaging, treatment and response to treatment Triage notes, nursing and pharmacy notes and ED provider's notes Notable results as noted in HPI Labs reviewed.  Sodium 128, potassium 4.6, ALT 90, bicarb 20, glucose 447, BUN 25, creatinine 2.56, calcium  8.7, total protein 7.3, albumin 2.7, AST  29, ALT 22, alk phos 77, total bilirubin 2.2, white count 23.2, hemoglobin 10.5, hematocrit 32.6, platelet count 368, lactic acid 1.9, pH 7.39, PCO238, PO234, bicarb 23, urine drug screen negative   Labs reviewed  Assessment and Plan: * DKA (diabetic ketoacidosis) (HCC) Present on admission and secondary to noncompliance to prescribed insulin  Noted to have an anion gap of 18 and hyperglycemia with serum glucose greater than 400 Continue aggressive IV fluid resuscitation Start patient on an insulin  drip per protocol Keep n.p.o. 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 No radiographic evidence of osteomyelitis. Will obtain MRI of the foot for further evaluation Consult podiatry Continue empiric antibiotic therapy with Unasyn  and linezolid   Sepsis (HCC) Present on admission secondary to infected diabetic foot wound As evidenced by fever with a Tmax of 101.3, tachycardia, tachypnea, marked leukocytosis Will place patient empirically on linezolid  and Unasyn  Follow-up results of blood cultures Continue aggressive IV fluid resuscitation Consult podiatry for diabetic foot wound  Anemia of chronic disease Noted to have a drop in his H&H from 15g/dl >> 89h/io No obvious source of blood loss at this time Obtain iron studies Monitor H&H closely  CKD stage 4 due to type 2 diabetes mellitus (HCC) Noted to have stage IV chronic kidney disease secondary to uncontrolled diabetes mellitus Renal function appears stable at this time We will monitor closely and will need referral to nephrology as an outpatient  Obesity (BMI 30-39.9) Patient with class I obesity, BMI of 33 Complicates overall prognosis and care Lifestyle modification and exercise has been discussed with patient in detail  Essential hypertension Will continue amlodipine  and carvedilol   Hyponatremia with increased serum osmolality Secondary to hyperglycemia related to DKA Expect improvement with IV fluid hydration      Advance Care Planning:   Code Status: Full Code   Consults: Podiatry  Family Communication: Plan of care was discussed with patient and his wife.  All questions and concerns have been addressed.  They verbalized understanding and agree with the plan.  Severity of Illness: The appropriate patient status for this patient is INPATIENT. Inpatient status is judged to be reasonable and necessary in order to provide the required intensity of service to ensure the patient's safety. The patient's presenting symptoms, physical exam findings, and initial radiographic and  laboratory data in the context of their chronic comorbidities is felt to place them at high risk for further clinical deterioration. Furthermore, it is not anticipated that the patient will be medically stable for discharge from the hospital within 2 midnights of admission.   * I certify that at the point of admission it is my clinical judgment that the patient will require inpatient hospital care spanning beyond 2 midnights from the point of admission due to high intensity of service, high risk for further deterioration and high frequency of surveillance required.*  Author: Aimee Somerset, MD 05/15/2024 4:15 PM  For on call review [email or web address].

## 2024-05-15 NOTE — Anesthesia Preprocedure Evaluation (Signed)
 Anesthesia Evaluation  Patient identified by MRN, date of birth, ID band Patient awake    Reviewed: Allergy & Precautions, H&P , NPO status , Patient's Chart, lab work & pertinent test results, reviewed documented beta blocker date and time   History of Anesthesia Complications Negative for: history of anesthetic complications  Airway Mallampati: II  TM Distance: >3 FB Neck ROM: full    Dental  (+) Dental Advidsory Given, Teeth Intact, Chipped   Pulmonary neg shortness of breath, asthma , neg COPD, neg recent URI   Pulmonary exam normal breath sounds clear to auscultation       Cardiovascular Exercise Tolerance: Good hypertension, (-) angina (-) Past MI and (-) Cardiac Stents Normal cardiovascular exam(-) dysrhythmias (-) Valvular Problems/Murmurs Rhythm:regular Rate:Normal     Neuro/Psych negative neurological ROS  negative psych ROS   GI/Hepatic Neg liver ROS,GERD  ,,  Endo/Other  diabetes, Poorly Controlled, Oral Hypoglycemic Agents, Insulin  Dependent    Renal/GU CRFRenal disease (CKD stage 4)  negative genitourinary   Musculoskeletal   Abdominal   Peds  Hematology negative hematology ROS (+)   Anesthesia Other Findings Past Medical History: No date: Asthma No date: CKD (chronic kidney disease), stage III (HCC) No date: Hypertension No date: Type II diabetes mellitus with renal manifestations (HCC)   Reproductive/Obstetrics negative OB ROS                              Anesthesia Physical Anesthesia Plan  ASA: 3  Anesthesia Plan: General   Post-op Pain Management:    Induction: Intravenous  PONV Risk Score and Plan: 2 and Ondansetron , Treatment may vary due to age or medical condition and Droperidol   Airway Management Planned: Oral ETT  Additional Equipment:   Intra-op Plan:   Post-operative Plan: Extubation in OR  Informed Consent: I have reviewed the patients  History and Physical, chart, labs and discussed the procedure including the risks, benefits and alternatives for the proposed anesthesia with the patient or authorized representative who has indicated his/her understanding and acceptance.     Dental Advisory Given  Plan Discussed with: Anesthesiologist, CRNA and Surgeon  Anesthesia Plan Comments:          Anesthesia Quick Evaluation

## 2024-05-15 NOTE — Op Note (Signed)
 Full Operative Report  Date of Operation: 5:48 PM, 05/15/2024   Patient: Clayton [name] - 53 y.o. 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  Irrigation and excisional debridement of ulceration to bone level, 6 x 7.5 cm, left foot 2.  Bone biopsy from cuboid via trocar, left foot 3.  Application antibiotic drug delivery device, left foot 4.  Application negative pressure wound therapy 6 x 7.5 cm, left foot     Anesthesia: General  Dario Barter, MD  Anesthesiologist: Dario Barter, MD CRNA: Lorriane Arabia, CRNA   Estimated Blood Loss: 50 mL  Hemostasis: 1) Anatomical dissection, mechanical compression, electrocautery 2) no tourniquet was used during the procedure  Implants: Implant Name Type Inv. Item Serial No. Manufacturer Lot No. LRB No. Used Action  KIT STIMULAN RAPID CURE 5CC - [id number] Orthopedic Implant KIT STIMULAN RAPID CURE 5CC  [employer] [id number] Left 1 Implanted    Materials: Wound VAC black sponge  Injectables: 1) Pre-operatively: 10 cc of 50:50 mixture 1%lidocaine  plain and 0.5% marcaine  plain 2) Post-operatively: None   Specimens: - Pathology: Bone biopsy from cuboid - Microbiology: Bone culture from the cuboid and separately deep tissue culture from ulceration, left foot   Antibiotics: IV antibiotics given per schedule from ED  Drains: None  Complications: Patient tolerated the procedure well without complication.   Operative findings: As below in detailed report  Indications for Procedure: Clayton [name] presents to Malvin Marsa FALCON, DPM with a chief complaint of draining ulceration of the left foot with concern for underlying subcutaneous emphysema.  Possible underlying osteomyelitis of the cuboid.  The patient has failed conservative treatments of various modalities. At this time the patient has  elected to proceed with surgical correction. All alternatives, risks, and complications of the procedures were thoroughly explained to the patient. Patient exhibits appropriate understanding of all discussion points and informed consent was signed and obtained in the chart with no guarantees to surgical outcome given or implied.  Description of Procedure: Patient was brought to the operating room. Patient remained on their hospital bed in the supine position. A surgical timeout was performed and all members of the operating room, the procedure, and the surgical site were identified. anesthesia occurred as per anesthesia record. Local anesthetic as previously described was then injected about the operative field in a local infiltrative block.  The operative lower extremity as noted above was then prepped and draped in the usual sterile manner. The following procedure then began.  Attention was directed to the ulceration present at the lateral midfoot plantarly near the fifth TMT J.  The ulceration had significant necrotic and fibropurulent tissues present in the wound bed.  The wound measured approximately 7.5 x 6 cm predebridement.  Next excisional debridement to the level of bone was carried out with 15 blade and rongeur.  Excision of  tissue was performed with removal of any necrotic and fibrotic tissues present in the wound bed.  The wound did probe down to the cuboid.  There was significant malodor associated with the ulceration during the debridement as well as some purulence that was expressed from underlying the fifth TMT J area.  The wound was completely and fully debrided of any infected tissues.  A deep tissue culture was harvested with rongeur from the ulceration and sent for culture.  It was then irrigated thoroughly with sterile saline via bulb syringe.  Next  a bone biopsy was performed via trocar Deep of the cuboid.  The trocar was inserted into the cuboid and 2 separate cores of bone were  harvested.  The first hardware was sent for pathology and a second core was sent for culture.  The bone was exposed in the wound.  Further irrigation was then performed via pulse lavage with sterile water  x 3 L.  Hemostasis was then achieved with electrocautery.  Next in order to control local infection decision made to apply antibiotic beads.  Small antibiotic beads mixed with vancomycin  and tobramycin  were made on the back table.  These were then implanted within the soft tissue wound.  Next due to the size of the soft tissue deficit measuring 6 x 7.5 cm postdebridement and approximately 1 cm in depth decision was made to apply wound VAC device.  Black foam wound VAC sponge was cut to the above measurements and placed within the wound bed.  This was then attached securely with derma tack drape.  The wound VAC was then attached and set to 125 mmHg continuous suction with no leaks detected.  The surgical site was then dressed with 4 x 4 gauze Kerlix Ace wrap. The patient tolerated both the procedure and anesthesia well with vital signs stable throughout. The patient was transferred in good condition and all vital signs stable  from the OR to recovery under the discretion of anesthesia.  Condition: Vital signs stable, neurovascular status unchanged from preoperative   Surgical plan:  Significant soft tissue and possibly bone infection detected in the surgery.  Await the culture and pathology data.  I do recommend MRI of the left foot for further characterization.  Patient is at high risk of losing the left foot if he is proven to have cuboid osteomyelitis.  This will be exceedingly difficult to achieve wound healing in this scenario but would be amenable to attempting long-term antibiotic therapy with wound VAC and grafting.  Would need infectious disease consultation for antibiotic selection in this case.  Possible return to the OR Wednesday pending further MRI and pathology data.  For now  nonweightbearing continue wound VAC continue IV antibiotics.  The patient will be nonweightbearing in a postop shoe to the operative limb until further instructed. The dressing is to remain clean, dry, and intact. Will continue to follow unless noted elsewhere.   Marsa Honour, DPM [facility]

## 2024-05-15 NOTE — Assessment & Plan Note (Signed)
 Secondary to medication noncompliance Diabetic education

## 2024-05-16 ENCOUNTER — Encounter: Payer: Self-pay | Admitting: Podiatry

## 2024-05-16 LAB — BASIC METABOLIC PANEL WITH GFR
Anion gap: 12 (ref 5–15)
Anion gap: 14 (ref 5–15)
Anion gap: 12 (ref 5–15)
BUN: 29 mg/dL — ABNORMAL HIGH (ref 6–20)
BUN: 32 mg/dL — ABNORMAL HIGH (ref 6–20)
BUN: 32 mg/dL — ABNORMAL HIGH (ref 6–20)
CO2: 21 mmol/L — ABNORMAL LOW (ref 22–32)
CO2: 20 mmol/L — ABNORMAL LOW (ref 22–32)
CO2: 22 mmol/L (ref 22–32)
Calcium: 8.3 mg/dL — ABNORMAL LOW (ref 8.9–10.3)
Calcium: 8.3 mg/dL — ABNORMAL LOW (ref 8.9–10.3)
Calcium: 8.5 mg/dL — ABNORMAL LOW (ref 8.9–10.3)
Chloride: 99 mmol/L (ref 98–111)
Chloride: 100 mmol/L (ref 98–111)
Chloride: 97 mmol/L — ABNORMAL LOW (ref 98–111)
Creatinine, Ser: 2.82 mg/dL — ABNORMAL HIGH (ref 0.61–1.24)
Creatinine, Ser: 2.99 mg/dL — ABNORMAL HIGH (ref 0.61–1.24)
Creatinine, Ser: 2.99 mg/dL — ABNORMAL HIGH (ref 0.61–1.24)
GFR, Estimated: 26 mL/min — ABNORMAL LOW (ref >60)
GFR, Estimated: 24 mL/min — ABNORMAL LOW
GFR, Estimated: 24 mL/min — ABNORMAL LOW (ref >60)
Glucose, Bld: 215 mg/dL — ABNORMAL HIGH (ref 70–99)
Glucose, Bld: 190 mg/dL — ABNORMAL HIGH (ref 70–99)
Glucose, Bld: 260 mg/dL — ABNORMAL HIGH (ref 70–99)
Potassium: 4.9 mmol/L (ref 3.5–5.1)
Potassium: 3.9 mmol/L (ref 3.5–5.1)
Potassium: 4.4 mmol/L (ref 3.5–5.1)
Sodium: 132 mmol/L — ABNORMAL LOW (ref 135–145)
Sodium: 134 mmol/L — ABNORMAL LOW (ref 135–145)
Sodium: 131 mmol/L — ABNORMAL LOW (ref 135–145)

## 2024-05-16 LAB — GLUCOSE, CAPILLARY
Glucose-Capillary: 257 mg/dL — ABNORMAL HIGH (ref 70–99)
Glucose-Capillary: 212 mg/dL — ABNORMAL HIGH (ref 70–99)
Glucose-Capillary: 175 mg/dL — ABNORMAL HIGH (ref 70–99)
Glucose-Capillary: 188 mg/dL — ABNORMAL HIGH (ref 70–99)
Glucose-Capillary: 259 mg/dL — ABNORMAL HIGH (ref 70–99)
Glucose-Capillary: 396 mg/dL — ABNORMAL HIGH (ref 70–99)
Glucose-Capillary: 287 mg/dL — ABNORMAL HIGH (ref 70–99)
Glucose-Capillary: 190 mg/dL — ABNORMAL HIGH (ref 70–99)

## 2024-05-16 LAB — CBC
HCT: 28.8 % — ABNORMAL LOW (ref 39.0–52.0)
Hemoglobin: 9.6 g/dL — ABNORMAL LOW (ref 13.0–17.0)
MCH: 30.2 pg (ref 26.0–34.0)
MCHC: 33.3 g/dL (ref 30.0–36.0)
MCV: 90.6 fL (ref 80.0–100.0)
Platelets: 326 K/uL (ref 150–400)
RBC: 3.18 MIL/uL — ABNORMAL LOW (ref 4.22–5.81)
RDW: 11.9 % (ref 11.5–15.5)
WBC: 19 K/uL — ABNORMAL HIGH (ref 4.0–10.5)
nRBC: 0 % (ref 0.0–0.2)

## 2024-05-16 LAB — BETA-HYDROXYBUTYRIC ACID
Beta-Hydroxybutyric Acid: 0.28 mmol/L — ABNORMAL HIGH (ref 0.05–0.27)
Beta-Hydroxybutyric Acid: 0.14 mmol/L (ref 0.05–0.27)
Beta-Hydroxybutyric Acid: 0.11 mmol/L (ref 0.05–0.27)
Beta-Hydroxybutyric Acid: 0.95 mmol/L — ABNORMAL HIGH (ref 0.05–0.27)

## 2024-05-16 LAB — HIV ANTIBODY (ROUTINE TESTING W REFLEX): HIV Screen 4th Generation wRfx: NONREACTIVE

## 2024-05-16 MED ORDER — INSULIN ASPART 100 UNIT/ML IJ SOLN
0.0000 [IU] | Freq: Three times a day (TID) | INTRAMUSCULAR | Status: DC
  Administered 2024-05-16: 15 [IU] via SUBCUTANEOUS
  Administered 2024-05-16: 3 [IU] via SUBCUTANEOUS
  Administered 2024-05-16: 8 [IU] via SUBCUTANEOUS
  Administered 2024-05-17 (×3): 5 [IU] via SUBCUTANEOUS
  Administered 2024-05-18 (×2): 8 [IU] via SUBCUTANEOUS
  Administered 2024-05-19: 5 [IU] via SUBCUTANEOUS
  Administered 2024-05-19: 8 [IU] via SUBCUTANEOUS
  Administered 2024-05-19 – 2024-05-20 (×2): 5 [IU] via SUBCUTANEOUS
  Administered 2024-05-20: 3 [IU] via SUBCUTANEOUS
  Administered 2024-05-20: 5 [IU] via SUBCUTANEOUS
  Administered 2024-05-21: 2 [IU] via SUBCUTANEOUS
  Administered 2024-05-21 (×2): 3 [IU] via SUBCUTANEOUS
  Filled 2024-05-16 (×17): qty 1

## 2024-05-16 MED ORDER — ENSURE MAX PROTEIN PO LIQD: 11.0000 [oz_av] | Freq: Two times a day (BID) | ORAL | Status: DC

## 2024-05-16 MED ORDER — VITAMIN C 500 MG PO TABS
500.0000 mg | ORAL_TABLET | Freq: Two times a day (BID) | ORAL | Status: DC
  Administered 2024-05-16 – 2024-05-21 (×9): 500 mg via ORAL
  Filled 2024-05-16 (×9): qty 1

## 2024-05-16 MED ORDER — THIAMINE HCL 100 MG PO TABS
100.0000 mg | ORAL_TABLET | Freq: Every day | ORAL | Status: DC
  Administered 2024-05-18 – 2024-05-21 (×4): 100 mg via ORAL
  Filled 2024-05-16 (×9): qty 1

## 2024-05-16 MED ORDER — ADULT MULTIVITAMIN W/MINERALS CH
1.0000 | ORAL_TABLET | Freq: Every day | ORAL | Status: DC
  Administered 2024-05-18 – 2024-05-21 (×4): 1 via ORAL
  Filled 2024-05-16 (×4): qty 1

## 2024-05-16 MED ORDER — INSULIN GLARGINE-YFGN 100 UNIT/ML ~~LOC~~ SOLN
20.0000 [IU] | Freq: Every day | SUBCUTANEOUS | Status: DC
  Administered 2024-05-17: 20 [IU] via SUBCUTANEOUS
  Filled 2024-05-16 (×2): qty 0.2

## 2024-05-16 MED ORDER — INSULIN GLARGINE-YFGN 100 UNIT/ML ~~LOC~~ SOLN
10.0000 [IU] | Freq: Once | SUBCUTANEOUS | Status: AC
  Administered 2024-05-16: 10 [IU] via SUBCUTANEOUS
  Filled 2024-05-16: qty 0.1

## 2024-05-16 MED ORDER — NEPRO/CARBSTEADY PO LIQD
237.0000 mL | Freq: Two times a day (BID) | ORAL | Status: DC
  Administered 2024-05-18 – 2024-05-19 (×3): 237 mL via ORAL

## 2024-05-16 MED ORDER — INSULIN GLARGINE-YFGN 100 UNIT/ML ~~LOC~~ SOLN
10.0000 [IU] | Freq: Every day | SUBCUTANEOUS | Status: DC
  Administered 2024-05-16: 10 [IU] via SUBCUTANEOUS
  Filled 2024-05-16: qty 0.1

## 2024-05-16 MED ORDER — AMLODIPINE BESYLATE 10 MG PO TABS
10.0000 mg | ORAL_TABLET | Freq: Every day | ORAL | Status: DC
  Administered 2024-05-16 – 2024-05-21 (×6): 10 mg via ORAL
  Filled 2024-05-16 (×6): qty 1

## 2024-05-16 MED ORDER — LIVING WELL WITH DIABETES BOOK
Freq: Once | Status: AC
  Filled 2024-05-16: qty 1

## 2024-05-16 MED ORDER — PANTOPRAZOLE SODIUM 40 MG PO TBEC
40.0000 mg | DELAYED_RELEASE_TABLET | Freq: Every day | ORAL | Status: DC
  Administered 2024-05-17 – 2024-05-21 (×5): 40 mg via ORAL
  Filled 2024-05-16 (×5): qty 1

## 2024-05-16 MED ORDER — SODIUM CHLORIDE 0.9 % IV SOLN: INTRAVENOUS | Status: AC

## 2024-05-16 MED ORDER — CARVEDILOL 25 MG PO TABS
25.0000 mg | ORAL_TABLET | Freq: Two times a day (BID) | ORAL | Status: AC
  Administered 2024-05-16 – 2024-05-20 (×10): 25 mg via ORAL
  Filled 2024-05-16 (×6): qty 1
  Filled 2024-05-16 (×3): qty 2
  Filled 2024-05-16: qty 1

## 2024-05-16 MED ORDER — ZINC SULFATE 220 (50 ZN) MG PO CAPS
220.0000 mg | ORAL_CAPSULE | Freq: Every day | ORAL | Status: DC
  Administered 2024-05-18 – 2024-05-21 (×4): 220 mg via ORAL
  Filled 2024-05-16 (×4): qty 1

## 2024-05-16 MED ORDER — SODIUM CHLORIDE 0.9 % IV SOLN
3.0000 g | Freq: Four times a day (QID) | INTRAVENOUS | Status: DC
  Administered 2024-05-16 – 2024-05-19 (×12): 3 g via INTRAVENOUS
  Filled 2024-05-16 (×13): qty 8

## 2024-05-16 MED ORDER — PHENOL 1.4 % MT LIQD
1.0000 | OROMUCOSAL | Status: DC | PRN
  Administered 2024-05-16: 1 via OROMUCOSAL
  Filled 2024-05-16 (×2): qty 177

## 2024-05-16 MED ORDER — OXYCODONE HCL 5 MG PO TABS
5.0000 mg | ORAL_TABLET | Freq: Four times a day (QID) | ORAL | Status: DC | PRN
  Administered 2024-05-16 – 2024-05-21 (×8): 5 mg via ORAL
  Filled 2024-05-16 (×9): qty 1

## 2024-05-16 MED ORDER — ALUM & MAG HYDROXIDE-SIMETH 200-200-20 MG/5ML PO SUSP
30.0000 mL | ORAL | Status: DC | PRN
  Administered 2024-05-16 – 2024-05-17 (×2): 30 mL via ORAL
  Filled 2024-05-16 (×2): qty 30

## 2024-05-16 NOTE — Anesthesia Postprocedure Evaluation (Signed)
 Anesthesia Post Note  Patient: Clayton [name]  Procedure(s) Performed: IRRIGATION AND DEBRIDEMENT FOOT (Left: Foot) APPLICATION, WOUND VAC (Left: Foot)  Patient location during evaluation: SICU Anesthesia Type: General Level of consciousness: awake Pain management: pain level controlled Vital Signs Assessment: post-procedure vital signs reviewed and stable Respiratory status: spontaneous breathing Cardiovascular status: stable Postop Assessment: no apparent nausea or vomiting Anesthetic complications: no   No notable events documented.   Last Vitals:  Vitals:   05/16/24 0600 05/16/24 0700  BP: (!) 147/90 126/81  Pulse: 70 66  Resp: 18 16  Temp:    SpO2: 98% 92%    Last Pain:  Vitals:   05/16/24 0457  TempSrc:   PainSc: Asleep                 Samiel Peel B Lacretia

## 2024-05-16 NOTE — Inpatient Diabetes Management (Addendum)
 Inpatient Diabetes Program Recommendations  AACE/ADA: New Consensus Statement on Inpatient Glycemic Control (2015)  Target Ranges:  Prepandial:   less than 140 mg/dL      Peak postprandial:   less than 180 mg/dL (1-2 hours)      Critically ill patients:  140 - 180 mg/dL   Lab Results  Component Value Date   GLUCAP 175 (H) 05/16/2024   HGBA1C 11.1 (H) 05/15/2024    Latest Reference Range & Units 05/15/24 16:01 05/15/24 16:23 05/15/24 18:01 05/15/24 18:56 05/15/24 21:45 05/15/24 23:46 05/16/24 01:57 05/16/24 03:52 05/16/24 08:21  Glucose-Capillary 70 - 99 mg/dL 608 (H) 628 (H) 643 (H) 321 (H) 303 (H) 311 (H) 257 (H) 212 (H) 175 (H)  (H): Data is abnormally high  Diabetes history: DM2 Outpatient Diabetes medications: 75/25 15 (has only been taking 14 units once daily) units bid, Jardiance 25 mg daily, Metformin  1 gm bid Current orders for Inpatient glycemic control: Semglee  10 units daily, Novolog  0-20 units q 4 hrs. Decadron  5 mg x 1 @ 1719 yesterday  Inpatient Diabetes Program Recommendations:   Please consider: -Increase Semglee  to 20 units daily or increase to 10 units bid -Decrease Novolog  correction to 0-9 units q 4 hrs. Plan to see pt. Today to verify if patient continued to take Jardiance and Metformin  when he was eating.  Spoke with patient @ bedside. Patient states he has only been taking his 75/25 insulin  14 units once daily. Reviewed with patient last discharge orders listed 15 units bid ac meals. Gave patient Herlene 3 CGMs for home use. Patient thought he had applied for insurance through his work @ [employer] but not showing as active. Had been on his wife's insurance until a few months ago when she changed jobs.  Thank you, Cinch Ormond E. Mukesh Kornegay, RN, MSN, CDCES  Diabetes Coordinator Inpatient Glycemic Control Team Team Pager [phone] (8am-5pm) 05/16/2024 9:29 AM

## 2024-05-16 NOTE — Progress Notes (Signed)
 Progress Note   Patient: Clayton [name] FMW:[id number] DOB: 06-16-1971 DOA: 05/15/2024     1 DOS: the patient was seen and examined on 05/16/2024   Brief hospital course:  Bradley Bostelman is a 53 y.o. 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  Patient states that he has had symptoms for about a week and has not been able to tolerate any oral intake.  He has not taken his insulin  in 1 week due to the fact that his oral intake has been poor.  He complains about pain mostly in the periumbilical area.  He rates his pain a 7 x 10 in intensity at its worst.  Pain is nonradiating.  He denies having any diarrhea.  Emesis is mostly fluid and bile.  Has not contained any blood or coffee-ground. He states that about a week and half ago he had stepped on an object and developed a blister involving his left foot.  He notes that the blister popped and he now has a large ulceration involving the left lateral portion of his left foot with foul-smelling drainage.  He denies having any fever or chills. He denies having any chest pain, no shortness of breath, no headache, no dizziness, no lightheadedness, no blurred vision, no urinary symptoms, no focal deficit. Abnormal labs include a sodium of 128, glucose 447, BUN 25, creatinine 2.56, total bilirubin 2.2, albumin 2.7, white count 23.2, hemoglobin 10.5 Left foot x-ray shows soft tissue thickening and subcutaneous emphysema along the lateral mid and plantar hindfoot, consistent with known ulcer. No radiographic evidence of osteomyelitis. Patient received 2 L IV fluid bolus in the ER as well as a dose of vancomycin  and Zosyn . He also received Tdap He will be admitted to the hospital for further evaluation.    Assessment and Plan:  * DKA  (diabetic ketoacidosis) (HCC) Present on admission and secondary to noncompliance to prescribed insulin  Noted to have an anion gap of 18 and hyperglycemia with serum glucose greater than 400 associated with refractory nausea and vomiting Patient was started on insulin  drip per protocol but has been transitioned to long-acting and Premeal insulin  Place patient on Lantus  20 units daily and Premeal insulin  Continue IV fluid hydration Check and supplement electrolytes   Uncontrolled type 2 diabetes mellitus with hyperglycemia, with long-term current use of insulin  (HCC) Secondary to medication noncompliance Diabetic education   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, with fat layer exposed (HCC) Patient noted to have a wound involving the left lateral foot with foul-smelling drainage X-ray of the left foot shows soft tissue thickening and subcutaneous emphysema along the lateral mid and plantar hindfoot, consistent with known ulcer. No radiographic evidence of osteomyelitis. MRI of the foot showed a large open wound on the plantar and lateral aspect of the midfoot hindfoot junction region. Diffuse cellulitis and myofasciitis without definite findings for pyomyositis. Fluid collection wrapping around the bases of the fourth and fifth metatarsals dorsally could be postoperative fluid collection or dissecting abscess. 14 x 13 mm cystic area in the plantar subcutaneous fat slightly more distally. I do not see any surrounding inflammatory changes to suggest this is an abscess but a small abscess is possible. No definite MR findings to suggest osteomyelitis or septic arthritis. Appreciate podiatry input.  Patient is status post irrigation and excisional  debridement of ulceration to bone level, 6 x 7.5 cm, left foot. Bone biopsy from cuboid via trocar, left foot Application antibiotic drug delivery device, left foot. Application negative pressure wound therapy 6 x 7.5 cm, left  foot Continue empiric antibiotic therapy with Unasyn  and linezolid  Follow-up results of wound cultures and bone biopsy   Sepsis (HCC) Present on admission secondary to infected diabetic foot wound As evidenced by fever with a Tmax of 101.3, tachycardia, tachypnea, marked leukocytosis Continue Linezolid  and Unasyn  Follow-up results of blood cultures and wound cultures Continue aggressive IV fluid resuscitation Appreciate podiatry input    Anemia of chronic disease Noted to have a drop in his H&H from 15g/dl >> 89h/io No obvious source of blood loss at this time Obtain iron studies Monitor H&H closely   CKD stage 4 due to type 2 diabetes mellitus (HCC) Noted to have stage IV chronic kidney disease secondary to uncontrolled diabetes mellitus Noted to have slight worsening of his renal function We will monitor closely and will need referral to nephrology as an outpatient   Obesity (BMI 30-39.9) Patient with class I obesity, BMI of 33 Complicates overall prognosis and care Lifestyle modification and exercise has been discussed with patient in detail   Essential hypertension Will continue amlodipine  and carvedilol    Hyponatremia with increased serum osmolality Secondary to hyperglycemia related to DKA Expect improvement with IV fluid hydration Improved with IV fluid hydration          Subjective: No further episodes of nausea and vomiting.  Abdominal pain has resolved  Physical Exam: Vitals:   05/16/24 1100 05/16/24 1200 05/16/24 1300 05/16/24 1400  BP: (!) 171/98 (!) 162/105 (!) 162/93 (!) 141/86  Pulse: 79 77 81 86  Resp: 14 20 (!) 21 13  Temp:      TempSrc:      SpO2: 97% 97% 96% 95%  Weight:      Height:       Constitutional:      Comments: Acutely ill-appearing  HENT:     Head: Normocephalic and atraumatic.     Mouth/Throat:     Comments: Moist mucous membranes Eyes:     Comments: Pale conjunctiva  Cardiovascular:     Rate and Rhythm: Regular, rate and  rhythm Pulmonary:     Effort: Pulmonary effort is normal.     Breath sounds: Normal breath sounds.  Abdominal:     General: Bowel sounds are normal.     Tenderness: Nontender    Comments: Central adiposity, mild tenderness in the periumbilical area, nondistended  Skin:    General: Skin is warm and dry.  Neurological:     Motor: Weakness present.  Psychiatric:        Mood and Affect: Mood normal.        Behavior: Behavior normal.    Data Reviewed: Sodium 134, calcium  8.3, creatinine 2.9, white count 19 Labs reviewed  Family Communication: Plan of care was discussed with patient at the bedside.  He verbalizes understanding and agrees to the plan.  Disposition: Status is: Inpatient Remains inpatient appropriate because: Continues to require IV antibiotics and awaiting results of wound culture  Planned Discharge Destination: Home    Time spent: 50 minutes  Author: Aimee Somerset, MD 05/16/2024 3:19 PM  For on call review [email or web address].

## 2024-05-16 NOTE — Progress Notes (Signed)
 Initial Nutrition Assessment  DOCUMENTATION CODES:   Obesity unspecified  INTERVENTION:   Nepro Shake po BID, each supplement provides 425 kcal and 19 grams protein  MVI po daily   Vitamin C  500mg  po BID   Zinc  220mg  po daily x 14 days   Thiamine  100mg  po daily x 7 days   Pt at high refeed risk; recommend monitor potassium, magnesium  and phosphorus labs daily until stable  Daily weights  Diabetes diet education  NUTRITION DIAGNOSIS:   Increased nutrient needs related to wound healing as evidenced by estimated needs.  GOAL:   Patient will meet greater than or equal to 90% of their needs  MONITOR:   PO intake, Supplement acceptance, Labs, Weight trends, Skin, I & O's  REASON FOR ASSESSMENT:   Consult Wound healing  ASSESSMENT:   53 y/o male with h/o CKD IV, HTN, anemia, seizures, asthma and uncontrolled DM who is admitted with left foot ulceration with necrosis of bone s/p I & D and VAC placement 7/28, sepsis and DKA.  Met with pt in room today. Pt reports good appetite and oral intake at baseline but reports poor oral intake for several days pta r/t nausea and vomiting. Pt reports that he has not eaten anything much since Sunday. Pt reports eating some applesauce and bacon for breakfast. Pt drinking a regular Ensure at the time of RD visit. RD discussed with pt the importance of adequate nutrition needed to preserve lean muscle and support wound healing. Pt is agreeable to vanilla supplements. RD will add supplements and vitamins to help pt meet his estimated needs and to support wound healing. Pt is at high refeed risk. RD provided patient with diabetic diet education today.   Pt reports some weight loss pta. Per chart, pt is down 15lbs(6%) from his UBW currently; RD unsure how recently weight loss occurred.   Medications reviewed and include: heparin , insulin , protonix , unasyn , linezolid    Labs reviewed: Na 131(L), K 4.4 wnl, BUN 32(H), creat 2.99(H) Wbc- 19.0(H),  Hgb 9.6(L), Hct 28.8(L) Cbgs- 396, 259, 188, 175, 212, 257 x 24 hrs  AIC 11.1(H)- 7/28  NUTRITION - FOCUSED PHYSICAL EXAM:  Flowsheet Row Most Recent Value  Orbital Region No depletion  Upper Arm Region No depletion  Thoracic and Lumbar Region No depletion  Buccal Region No depletion  Temple Region No depletion  Clavicle Bone Region No depletion  Clavicle and Acromion Bone Region No depletion  Scapular Bone Region No depletion  Dorsal Hand No depletion  Patellar Region No depletion  Anterior Thigh Region No depletion  Posterior Calf Region No depletion  Edema (RD Assessment) Moderate  [LLE]  Hair Reviewed  Eyes Reviewed  Mouth Reviewed  Skin Reviewed  Nails Reviewed   Diet Order:   Diet Order             Diet NPO time specified Except for: Sips with Meds  Diet effective midnight           Diet Carb Modified Fluid consistency: Thin; Room service appropriate? Yes  Diet effective now                  EDUCATION NEEDS:   Education needs have been addressed  Skin:  Skin Assessment: Reviewed RN Assessment (diabetic foot ulcer, insision s/p I & D, VAC)  Last BM:  7/27  Height:   Ht Readings from Last 1 Encounters:  05/15/24 5' 11 (1.803 m)    Weight:   Wt Readings from Last 1 Encounters:  05/15/24 111 kg    Ideal Body Weight:  78 kg  BMI:  Body mass index is 34.13 kg/m.  Estimated Nutritional Needs:   Kcal:  2300-2600kcal/day  Protein:  115-130g/day  Fluid:  2.4-2.7L/day  Augustin Shams MS, RD, LDN If unable to be reached, please send secure chat to RD inpatient available from 8:00a-4:00p daily

## 2024-05-17 ENCOUNTER — Encounter
Admission: EM | Disposition: A | Discharge status: Home Health | Payer: Self-pay | Source: Home / Self Care | Attending: Internal Medicine

## 2024-05-17 ENCOUNTER — Inpatient Hospital Stay: Payer: MEDICAID | Admitting: Anesthesiology

## 2024-05-17 ENCOUNTER — Encounter: Payer: Self-pay | Admitting: Internal Medicine

## 2024-05-17 ENCOUNTER — Other Ambulatory Visit: Payer: Self-pay

## 2024-05-17 DIAGNOSIS — L97524 Non-pressure chronic ulcer of other part of left foot with necrosis of bone: Secondary | ICD-10-CM

## 2024-05-17 HISTORY — PX: GRAFT APPLICATION: SHX6696

## 2024-05-17 HISTORY — PX: IRRIGATION AND DEBRIDEMENT FOOT: SHX6602

## 2024-05-17 HISTORY — PX: APPLICATION OF WOUND VAC: SHX5189

## 2024-05-17 LAB — SURGICAL PATHOLOGY

## 2024-05-17 LAB — BASIC METABOLIC PANEL WITH GFR
Anion gap: 8 (ref 5–15)
BUN: 37 mg/dL — ABNORMAL HIGH (ref 6–20)
CO2: 23 mmol/L (ref 22–32)
Calcium: 7.8 mg/dL — ABNORMAL LOW (ref 8.9–10.3)
Chloride: 98 mmol/L (ref 98–111)
Creatinine, Ser: 3.04 mg/dL — ABNORMAL HIGH (ref 0.61–1.24)
GFR, Estimated: 24 mL/min — ABNORMAL LOW (ref >60)
Glucose, Bld: 235 mg/dL — ABNORMAL HIGH (ref 70–99)
Potassium: 3.7 mmol/L (ref 3.5–5.1)
Sodium: 129 mmol/L — ABNORMAL LOW (ref 135–145)

## 2024-05-17 LAB — GLUCOSE, CAPILLARY
Glucose-Capillary: 238 mg/dL — ABNORMAL HIGH (ref 70–99)
Glucose-Capillary: 213 mg/dL — ABNORMAL HIGH (ref 70–99)
Glucose-Capillary: 209 mg/dL — ABNORMAL HIGH (ref 70–99)
Glucose-Capillary: 214 mg/dL — ABNORMAL HIGH (ref 70–99)
Glucose-Capillary: 241 mg/dL — ABNORMAL HIGH (ref 70–99)
Glucose-Capillary: 201 mg/dL — ABNORMAL HIGH (ref 70–99)

## 2024-05-17 LAB — MAGNESIUM: Magnesium: 2.1 mg/dL (ref 1.7–2.4)

## 2024-05-17 LAB — PHOSPHORUS: Phosphorus: 2.8 mg/dL (ref 2.5–4.6)

## 2024-05-17 SURGERY — IRRIGATION AND DEBRIDEMENT FOOT
Anesthesia: General | Site: Foot | Laterality: Left

## 2024-05-17 MED ORDER — BUPIVACAINE HCL (PF) 0.5 % IJ SOLN
INTRAMUSCULAR | Status: AC
  Filled 2024-05-17: qty 30

## 2024-05-17 MED ORDER — FENTANYL CITRATE (PF) 100 MCG/2ML IJ SOLN
INTRAMUSCULAR | Status: DC | PRN
  Administered 2024-05-17: 50 ug via INTRAVENOUS

## 2024-05-17 MED ORDER — OXYCODONE HCL 5 MG PO TABS
5.0000 mg | ORAL_TABLET | Freq: Once | ORAL | Status: AC | PRN
  Administered 2024-05-17: 5 mg via ORAL

## 2024-05-17 MED ORDER — HYDROMORPHONE BOLUS VIA INFUSION: 1.0000 mg | INTRAVENOUS | Status: DC | PRN

## 2024-05-17 MED ORDER — LIDOCAINE HCL (PF) 1 % IJ SOLN
INTRAMUSCULAR | Status: AC
  Filled 2024-05-17: qty 30

## 2024-05-17 MED ORDER — THROMBIN 5000 UNITS EX KIT
PACK | CUTANEOUS | Status: AC
  Filled 2024-05-17: qty 1

## 2024-05-17 MED ORDER — HYDROMORPHONE HCL 1 MG/ML IJ SOLN
1.0000 mg | INTRAMUSCULAR | Status: DC | PRN
  Administered 2024-05-17 – 2024-05-20 (×10): 1 mg via INTRAVENOUS
  Filled 2024-05-17 (×10): qty 1

## 2024-05-17 MED ORDER — GELATIN ABSORBABLE 12-7 MM EX MISC
CUTANEOUS | Status: AC
Start: 2024-05-17 — End: 2024-05-17
  Filled 2024-05-17: qty 1

## 2024-05-17 MED ORDER — GENTAMICIN SULFATE 40 MG/ML IJ SOLN
INTRAMUSCULAR | Status: AC
  Filled 2024-05-17: qty 2

## 2024-05-17 MED ORDER — LIDOCAINE HCL (PF) 2 % IJ SOLN
INTRAMUSCULAR | Status: AC
  Filled 2024-05-17: qty 5

## 2024-05-17 MED ORDER — PROPOFOL 10 MG/ML IV BOLUS
INTRAVENOUS | Status: DC | PRN
Start: 2024-05-17 — End: 2024-05-17
  Administered 2024-05-17: 100 ug/kg/min via INTRAVENOUS
  Administered 2024-05-17: 100 ug via INTRAVENOUS

## 2024-05-17 MED ORDER — SODIUM CHLORIDE 0.9 % IV SOLN: INTRAVENOUS | Status: AC | PRN

## 2024-05-17 MED ORDER — LIDOCAINE HCL (CARDIAC) PF 100 MG/5ML IV SOSY
PREFILLED_SYRINGE | INTRAVENOUS | Status: DC | PRN
  Administered 2024-05-17: 100 mg via INTRAVENOUS

## 2024-05-17 MED ORDER — VANCOMYCIN HCL 1000 MG IV SOLR
INTRAVENOUS | Status: AC
  Filled 2024-05-17: qty 20

## 2024-05-17 MED ORDER — TOBRAMYCIN SULFATE 80 MG/2ML IJ SOLN
INTRAMUSCULAR | Status: AC
  Filled 2024-05-17: qty 2

## 2024-05-17 MED ORDER — FENTANYL CITRATE (PF) 100 MCG/2ML IJ SOLN
25.0000 ug | INTRAMUSCULAR | Status: DC | PRN
  Administered 2024-05-17 (×4): 25 ug via INTRAVENOUS

## 2024-05-17 MED ORDER — GELATIN ABSORBABLE 100 EX MISC
CUTANEOUS | Status: DC | PRN
  Administered 2024-05-17: 1

## 2024-05-17 MED ORDER — 0.9 % SODIUM CHLORIDE (POUR BTL) OPTIME
TOPICAL | Status: DC | PRN
  Administered 2024-05-17: 1000 mL

## 2024-05-17 MED ORDER — THROMBIN 5000 UNITS EX SOLR
CUTANEOUS | Status: DC | PRN
  Administered 2024-05-17: 5000 [IU] via TOPICAL

## 2024-05-17 MED ORDER — SODIUM CHLORIDE 0.9 % IR SOLN
Status: DC | PRN
  Administered 2024-05-17: 3000 mL

## 2024-05-17 MED ORDER — MIDAZOLAM HCL 2 MG/2ML IJ SOLN
INTRAMUSCULAR | Status: AC
  Filled 2024-05-17: qty 2

## 2024-05-17 MED ORDER — PROPOFOL 10 MG/ML IV BOLUS
INTRAVENOUS | Status: AC
Start: 2024-05-17 — End: 2024-05-17
  Filled 2024-05-17: qty 20

## 2024-05-17 MED ORDER — PROPOFOL 500 MG/50ML IV EMUL: INTRAVENOUS | Status: DC | PRN

## 2024-05-17 MED ORDER — OXYCODONE HCL 5 MG PO TABS
ORAL_TABLET | ORAL | Status: AC
Start: 2024-05-17 — End: 2024-05-17
  Filled 2024-05-17: qty 1

## 2024-05-17 MED ORDER — LIDOCAINE HCL (PF) 1 % IJ SOLN
INTRAMUSCULAR | Status: DC | PRN
  Administered 2024-05-17 (×2): 10 mL via INTRAMUSCULAR

## 2024-05-17 MED ORDER — OXYCODONE HCL 5 MG/5ML PO SOLN: 5.0000 mg | Freq: Once | ORAL | Status: AC | PRN

## 2024-05-17 MED ORDER — FENTANYL CITRATE (PF) 100 MCG/2ML IJ SOLN
INTRAMUSCULAR | Status: AC
Start: 2024-05-17 — End: 2024-05-17
  Filled 2024-05-17: qty 2

## 2024-05-17 MED ORDER — THROMBIN 5000 UNITS EX KIT
PACK | CUTANEOUS | Status: AC
Start: 2024-05-17 — End: 2024-05-17
  Filled 2024-05-17: qty 1

## 2024-05-17 MED ORDER — SODIUM CHLORIDE 0.9 % IV SOLN: INTRAVENOUS | Status: DC | PRN

## 2024-05-17 MED ORDER — MIDAZOLAM HCL 2 MG/2ML IJ SOLN
INTRAMUSCULAR | Status: DC | PRN
  Administered 2024-05-17: 2 mg via INTRAVENOUS

## 2024-05-17 SURGICAL SUPPLY — 44 items
BLADE MED AGGRESSIVE (BLADE) IMPLANT
BNDG ELASTIC 4INX 5YD STR LF (GAUZE/BANDAGES/DRESSINGS) ×1 IMPLANT
BNDG GAUZE DERMACEA FLUFF 4 (GAUZE/BANDAGES/DRESSINGS) ×1 IMPLANT
CNTNR URN SCR LID CUP LEK RST (MISCELLANEOUS) IMPLANT
CUFF TOURN SGL QUICK 18X4 (TOURNIQUET CUFF) IMPLANT
CUFF TRNQT CYL 24X4X16.5-23 (TOURNIQUET CUFF) IMPLANT
DRSG EMULSION OIL 3X8 NADH (GAUZE/BANDAGES/DRESSINGS) ×1 IMPLANT
DRSG VAC GRANUFOAM MED (GAUZE/BANDAGES/DRESSINGS) IMPLANT
DURAPREP 26ML APPLICATOR (WOUND CARE) ×1 IMPLANT
ELECTRODE REM PT RTRN 9FT ADLT (ELECTROSURGICAL) ×1 IMPLANT
GAUZE SPONGE 4X4 12PLY STRL (GAUZE/BANDAGES/DRESSINGS) ×1 IMPLANT
GAUZE STRETCH 2X75IN STRL (MISCELLANEOUS) ×1 IMPLANT
GLOVE BIOGEL PI IND STRL 7.5 (GLOVE) ×1 IMPLANT
GLOVE SURG SYN 7.5 PF PI (GLOVE) ×1 IMPLANT
GOWN STRL REUS W/ TWL XL LVL3 (GOWN DISPOSABLE) ×1 IMPLANT
GOWN STRL REUS W/TWL MED LVL3 (GOWN DISPOSABLE) ×1 IMPLANT
HANDPIECE VERSAJET DEBRIDEMENT (MISCELLANEOUS) IMPLANT
KIT TURNOVER KIT A (KITS) ×1 IMPLANT
LABEL OR SOLS (LABEL) ×1 IMPLANT
MANIFOLD NEPTUNE II (INSTRUMENTS) ×1 IMPLANT
NDL BIOPSY JAMSHIDI 11X6 (NEEDLE) IMPLANT
NDL FILTER BLUNT 18X1 1/2 (NEEDLE) ×1 IMPLANT
NDL HYPO 25X1 1.5 SAFETY (NEEDLE) ×1 IMPLANT
NEEDLE BIOPSY JAMSHIDI 11X6 (NEEDLE) IMPLANT
NEEDLE FILTER BLUNT 18X1 1/2 (NEEDLE) IMPLANT
NEEDLE HYPO 25X1 1.5 SAFETY (NEEDLE) ×1 IMPLANT
NS IRRIG 500ML POUR BTL (IV SOLUTION) ×1 IMPLANT
PACK EXTREMITY ARMC (MISCELLANEOUS) ×1 IMPLANT
PAD PREP OB/GYN DISP 24X41 (PERSONAL CARE ITEMS) ×1 IMPLANT
PENCIL SMOKE EVACUATOR (MISCELLANEOUS) ×1 IMPLANT
SOL .9 NS 3000ML IRR UROMATIC (IV SOLUTION) IMPLANT
SOLUTION PREP PVP 2OZ (MISCELLANEOUS) ×1 IMPLANT
SPONGE SURGIFOAM ABS GEL 100C (HEMOSTASIS) IMPLANT
STAPLER SKIN PROX 35W (STAPLE) ×1 IMPLANT
STOCKINETTE 48X4 2 PLY STRL (GAUZE/BANDAGES/DRESSINGS) ×1 IMPLANT
STOCKINETTE STRL 4IN 9604848 (GAUZE/BANDAGES/DRESSINGS) ×1 IMPLANT
SURGILUBE 2OZ TUBE FLIPTOP (MISCELLANEOUS) ×1 IMPLANT
SUT PROLENE 3 0 PS 2 (SUTURE) IMPLANT
SUTURE ETHLN 4-0 FS2 18XMF BLK (SUTURE) IMPLANT
SWAB CULTURE AMIES ANAERIB BLU (MISCELLANEOUS) IMPLANT
SYR 10ML LL (SYRINGE) ×2 IMPLANT
TIP FAN IRRIG PULSAVAC PLUS (DISPOSABLE) IMPLANT
TISSUE MATRIDERM M 10.5X14.8X1 (Tissue) IMPLANT
TRAP FLUID SMOKE EVACUATOR (MISCELLANEOUS) ×1 IMPLANT

## 2024-05-17 NOTE — Progress Notes (Signed)
 Pt transported to room 140 via bed, on telemetry accompanied by Chastity, NT and this RN. Final hand-ff given at bedside to Care RN, Bri.

## 2024-05-17 NOTE — Progress Notes (Signed)
   PODIATRY PROGRESS NOTE Patient Name: Clayton [name]  DOB 08-Apr-1971 DOA 05/15/2024  Hospital Day: 3  Assessment:  53 y.o. male with PMHx significant for  diabetes mellitus with complications of stage IV chronic kidney disease, hypertension, obesity with a BMI of 33.40  with large ulceration with underlying subcutaneous emphysema of the left foot.  Concern for possible underlying osteomyelitis of the midfoot including the cuboid.   WBC 19 ESR and CRP  126 / 28.4 A1c  11.1   XR left foot:Soft tissue thickening and subcutaneous emphysema along the lateral mid and plantar hindfoot, consistent with known ulcer. No radiographic evidence of osteomyelitis.  Wound bone cultures: GPC and GNR, pending  Pathology cuboid bone biopsyu: 1. Debridement, left foot cuboid :       - ACUTE OSTEOMYELITIS.   Plan:  - NPO for OR today for Left foot debridement, graft and wound vac application. Pt agrees to proceed. - Unfortunately his cuboid bone biopsy is + for acute OM - he will require ID consultation for long term abx therapy and is at high risk of limb loss 2/2 rearfoot osteomyelitis.  - Anticoagulation: Hold postop given wound VAC and open wound - Wound care: Leave wound VAC clean dry and intact. Will need home vac and home health vac changes upon dc.  - WB status: Nonweightbearing to the left foot postop - Will continue to follow        Marolyn JULIANNA Honour, DPM [facility]    Subjective:  Pt reports he is having 7/10 pain this AM. We discussed plan for OR. Discussed he will need long term abx in attempt at limb salvage.   Objective:   Vitals:   05/17/24 0900 05/17/24 0929  BP: 137/69 133/88  Pulse: 67 71  Resp: 17 16  Temp:  97.8 F (36.6 C)  SpO2: 100% 98%       Latest Ref Rng & Units 05/16/2024    4:05 AM 05/15/2024   11:55 AM 09/28/2023    5:47 AM  CBC  WBC 4.0 - 10.5 K/uL 19.0  23.2  12.5   Hemoglobin 13.0 - 17.0 g/dL 9.6  89.4  84.6   Hematocrit 39.0 - 52.0 %  28.8  32.6  45.2   Platelets 150 - 400 K/uL 326  368  290        Latest Ref Rng & Units 05/17/2024    3:28 AM 05/16/2024   11:10 AM 05/16/2024    7:48 AM  BMP  Glucose 70 - 99 mg/dL 764  739  809   BUN 6 - 20 mg/dL 37  32  32   Creatinine 0.61 - 1.24 mg/dL 6.95  7.00  7.00   Sodium 135 - 145 mmol/L 129  131  134   Potassium 3.5 - 5.1 mmol/L 3.7  4.4  3.9   Chloride 98 - 111 mmol/L 98  97  100   CO2 22 - 32 mmol/L 23  22  20    Calcium  8.9 - 10.3 mg/dL 7.8  8.5  8.3     General: AAOx3, NAD  Lower Extremity Exam Left foot dressing C/D/I Wound vac intact and functioning with 50 mL serosanguinous in canister   Radiology:  Results reviewed. See assessment for pertinent imaging results

## 2024-05-17 NOTE — Progress Notes (Signed)
 Report given to 1A care nurse, pt to transfer to room 140.

## 2024-05-17 NOTE — Anesthesia Preprocedure Evaluation (Signed)
 Anesthesia Evaluation  Patient identified by MRN, date of birth, ID band Patient awake    Reviewed: Allergy & Precautions, NPO status , Patient's Chart, lab work & pertinent test results  History of Anesthesia Complications Negative for: history of anesthetic complications  Airway Mallampati: III  TM Distance: >3 FB Neck ROM: full    Dental  (+) Chipped, Poor Dentition   Pulmonary neg shortness of breath, asthma    Pulmonary exam normal        Cardiovascular Exercise Tolerance: Good hypertension, Normal cardiovascular exam     Neuro/Psych negative neurological ROS  negative psych ROS   GI/Hepatic negative GI ROS, Neg liver ROS,neg GERD  ,,  Endo/Other  diabetes, Type 2, Insulin  Dependent    Renal/GU Renal disease  negative genitourinary   Musculoskeletal   Abdominal   Peds  Hematology negative hematology ROS (+)   Anesthesia Other Findings Past Medical History: No date: Asthma No date: CKD (chronic kidney disease), stage III (HCC) No date: Hypertension No date: Type II diabetes mellitus with renal manifestations (HCC)  Past Surgical History: 05/15/2024: APPLICATION OF WOUND VAC; Left     Comment:  Procedure: APPLICATION, WOUND VAC;  Surgeon: Malvin Marsa FALCON, DPM;  Location: [facility] ORS;  Service:               Orthopedics/Podiatry;  Laterality: Left; No date: CATARACT EXTRACTION 05/15/2024: IRRIGATION AND DEBRIDEMENT FOOT; Left     Comment:  Procedure: IRRIGATION AND DEBRIDEMENT FOOT;  Surgeon:               Malvin Marsa FALCON, DPM;  Location: [facility] ORS;                Service: Orthopedics/Podiatry;  Laterality: Left;  WITH               BONE BIOPSY  BMI    Body Mass Index: 35.01 kg/m      Reproductive/Obstetrics negative OB ROS                              Anesthesia Physical Anesthesia Plan  ASA: 3  Anesthesia Plan: General   Post-op Pain  Management:    Induction: Intravenous  PONV Risk Score and Plan: Propofol  infusion and TIVA  Airway Management Planned: Natural Airway and Nasal Cannula  Additional Equipment:   Intra-op Plan:   Post-operative Plan:   Informed Consent: I have reviewed the patients History and Physical, chart, labs and discussed the procedure including the risks, benefits and alternatives for the proposed anesthesia with the patient or authorized representative who has indicated his/her understanding and acceptance.     Dental Advisory Given  Plan Discussed with: Anesthesiologist, CRNA and Surgeon  Anesthesia Plan Comments: (Patient consented for risks of anesthesia including but not limited to:  - adverse reactions to medications - risk of airway placement if required - damage to eyes, teeth, lips or other oral mucosa - nerve damage due to positioning  - sore throat or hoarseness - Damage to heart, brain, nerves, lungs, other parts of body or loss of life  Patient voiced understanding and assent.)        Anesthesia Quick Evaluation

## 2024-05-17 NOTE — Progress Notes (Signed)
 Progress Note   Patient: Clayton [name] FMW:[id number] DOB: 23-Oct-1970 DOA: 05/15/2024     2 DOS: the patient was seen and examined on 05/17/2024   Brief hospital course:  Clayton [name] is a 53 y.o. 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  Patient states that he has had symptoms for about a week and has not been able to tolerate any oral intake.  He has not taken his insulin  in 1 week due to the fact that his oral intake has been poor.  He complains about pain mostly in the periumbilical area.  He rates his pain a 7 x 10 in intensity at its worst.  Pain is nonradiating.  He denies having any diarrhea.  Emesis is mostly fluid and bile.  Has not contained any blood or coffee-ground. He states that about a week and half ago he had stepped on an object and developed a blister involving his left foot.  He notes that the blister popped and he now has a large ulceration involving the left lateral portion of his left foot with foul-smelling drainage.  He denies having any fever or chills. He denies having any chest pain, no shortness of breath, no headache, no dizziness, no lightheadedness, no blurred vision, no urinary symptoms, no focal deficit. Abnormal labs include a sodium of 128, glucose 447, BUN 25, creatinine 2.56, total bilirubin 2.2, albumin 2.7, white count 23.2, hemoglobin 10.5 Left foot x-ray shows soft tissue thickening and subcutaneous emphysema along the lateral mid and plantar hindfoot, consistent with known ulcer. No radiographic evidence of osteomyelitis. Patient received 2 L IV fluid bolus in the ER as well as a dose of vancomycin  and Zosyn . He also received Tdap He will be admitted to the hospital for further evaluation.     Assessment and Plan:  * DKA  (diabetic ketoacidosis) (HCC) Present on admission and secondary to noncompliance to prescribed insulin  Noted to have an anion gap of 18 and hyperglycemia with serum glucose greater than 400 associated with refractory nausea and vomiting Patient was started on insulin  drip per protocol but has been transitioned to long-acting and Premeal insulin  Continue Lantus  20 units daily and Premeal insulin  Continue IV fluid hydration Check and supplement electrolytes    Uncontrolled type 2 diabetes mellitus with hyperglycemia, with long-term current use of insulin  (HCC) Secondary to medication noncompliance Diabetic education    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 No radiographic evidence of osteomyelitis. MRI of the foot showed a large open wound on the plantar and lateral aspect of the midfoot hindfoot junction region. Diffuse cellulitis and myofasciitis without definite findings for pyomyositis. Fluid collection wrapping around the bases of the fourth and fifth metatarsals dorsally could be postoperative fluid collection or dissecting abscess. 14 x 13 mm cystic area in the plantar subcutaneous fat slightly more distally. I do not see any surrounding inflammatory changes to suggest this is an abscess but a small abscess is possible. No definite MR findings to suggest osteomyelitis or septic arthritis. Appreciate podiatry input.  Patient is  status post irrigation and excisional debridement of ulceration to bone level, 6 x 7.5 cm, left foot. Bone biopsy from cuboid via trocar, left foot Application antibiotic drug delivery device, left foot. Application negative pressure wound  therapy 6 x 7.5 cm, left foot Continue empiric antibiotic therapy with Unasyn  and linezolid  Wound cultures yield abundant Staph aureus and Streptococcus mitis/oralis Surgical pathology is consistent with acute osteomyelitis ID consult to recommend antibiotic therapy as well as duration of treatment   Sepsis (HCC) Present on admission secondary to infected diabetic foot wound As evidenced by fever with a Tmax of 101.3, tachycardia, tachypnea, marked leukocytosis Continue Linezolid  and Unasyn  Blood cultures showed no growth in 2 days but wound cultures yield Staph aureus and Streptococcus mitis/oralis Appreciate podiatry input Consult ID     Anemia of chronic disease Noted to have a drop in his H&H from 15g/dl >> 89h/io No obvious source of blood loss at this time Obtain iron studies Monitor H&H closely    CKD stage 4 due to type 2 diabetes mellitus (HCC) Noted to have stage IV chronic kidney disease secondary to uncontrolled diabetes mellitus Noted to have slight worsening of his renal function Will consult nephrology   Obesity (BMI 30-39.9) Patient with class I obesity, BMI of 33 Complicates overall prognosis and care Lifestyle modification and exercise has been discussed with patient in detail   Essential hypertension Will continue amlodipine  and carvedilol    Hyponatremia with increased serum osmolality Secondary to hyperglycemia related to DKA Expect improvement with IV fluid hydration Improved with IV fluid hydration            Subjective: Nausea and vomiting have resolved.  Tolerating his diet  Physical Exam: Vitals:   05/17/24 1215 05/17/24 1233 05/17/24 1300 05/17/24 1400  BP: 114/74  133/74 130/75  Pulse: 66  71 72  Resp: (!) 21  19 19   Temp:  98.2 F (36.8 C)    TempSrc:  Oral    SpO2: 100%  100% 95%  Weight:      Height:        Comments: Appears comfortable HENT:     Head: Normocephalic and atraumatic.     Mouth/Throat:     Comments: Moist  mucous membranes Eyes:     Comments: Pale conjunctiva  Cardiovascular:     Rate and Rhythm: Regular, rate and rhythm Pulmonary:     Effort: Pulmonary effort is normal.     Breath sounds: Normal breath sounds.  Abdominal:     General: Bowel sounds are normal.     Tenderness: Nontender    Comments: Central adiposity, nontender, nondistended  Skin:    General: Skin is warm and dry.  Neurological:     Motor: Weakness present.  Psychiatric:        Mood and Affect: Mood normal.        Behavior: Behavior normal.   Data Reviewed: Sodium 129, bicarb 23, creatinine 3.04 Labs reviewed  Family Communication: Plan of care discussed with patient at the bedside.  All questions and concerns have been addressed.  He verbalizes understanding and agrees with the plan.  Will transfer to the medical floor  Disposition: Status is: Inpatient Remains inpatient appropriate because: On IV antibiotics for acute osteomyelitis  Planned Discharge Destination: Home    Time spent: 40  minutes  Author: Aimee Somerset, MD 05/17/2024 3:14 PM  For on call review [email or web address].

## 2024-05-17 NOTE — Op Note (Signed)
 Full Operative Report  Date of Operation: 10:56 AM, 05/17/2024   Patient: Clayton [name] - 53 y.o. male  Surgeon: Malvin Marsa FALCON, DPM   Assistant: None  Diagnosis: LEFT FOOT ULCERATION, Acute osteomyelitis of left foot  Procedure:  1.  Irrigation debridement of ulceration to the level of bone with prep for graft, left foot 2.  Application Maitri Derm dermal allograft, 7 x 6.5 cm, left foot 3.  Application negative pressure wound therapy, 7 x 6.5 cm, left foot    Anesthesia: General  Piscitello, Fairy POUR, MD  Anesthesiologist: Piscitello, Fairy POUR, MD CRNA: Bonnetta Jimmey SAUNDERS, CRNA Student Nurse Anesthetist: Carletta Leos A, RN   Estimated Blood Loss: 75 mL  Hemostasis: 1) Anatomical dissection, mechanical compression, electrocautery 2) no tourniquet was used during procedure  Implants: Implant Name Type Inv. Item Serial No. Manufacturer Lot No. LRB No. Used Action  TISSUE FRANCESCO HERO [id number] - [id number] Tissue TISSUE FRANCESCO HERO [id number]  [employer] LC [id number] Left 1 Implanted    Materials: Skin staples, black foam wound VAC sponge  Injectables: 1) Pre-operatively: 20 cc of 50:50 mixture 1%lidocaine  plain and 0.5% marcaine  plain 2) Post-operatively: None   Specimens: - Pathology: None - Microbiology: None   Antibiotics: IV antibiotics given per schedule on the floor  Drains: None  Complications: Patient tolerated the procedure well without complication.   Operative findings: As below in detailed report  Indications for Procedure: [name] presents to Malvin Marsa FALCON, DPM with a chief complaint of open ulceration at the lateral aspect of left foot status post prior irrigation debridement of infected ulceration 2 days ago.  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  Presenting for planned return to the OR for repeat debridement graft and VAC  application.  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  The patient has failed conservative treatments of various modalities. At this time the patient has elected to proceed with surgical correction. All alternatives, risks, and complications of the procedures were thoroughly explained to the patient. Patient exhibits appropriate understanding of all discussion points and informed consent was signed and obtained in the chart with no guarantees to surgical outcome given or implied.  Description of Procedure: Patient was brought to the operating room. Patient remained on their hospital bed in the supine position. A surgical timeout was performed and all members of the operating room, the procedure, and the surgical site were identified. anesthesia occurred as per anesthesia record. Local anesthetic as previously described was then injected about the operative field in a local infiltrative block.  The operative lower extremity as noted above was then prepped and draped in the usual sterile manner. The following procedure then began.  Attention was directed to the ulceration present at the lateral aspect of the left foot.  Antibiotic beads were present in the wound bed.  There was mild purulence noted in the sinus tract that was wrapping underneath the 5th and 4th metatarsal bases and TMT J area.  The wound was then debrided thoroughly and prepared for graft application.  Any necrotic tissue and purulence was debrided with rongeur to level of bone.  Periosteum and bone at the area of bone biopsy was lightly debrided excisionally with rongeur.  There was noted be some bleeding from the sinus tract area upon debridement therefore decision was made to use Gelfoam with thrombin  and packing in the sinus tract  for hemostasis.  Hemostasis was additionally achieved with electrocautery.  Next the surgical site was irrigated with 3 L of sterile saline via power pulse lavage.  Upon completion of the  [place] irrigation there was no further purulence no further necrotic tissues or any evidence of residual purulence clinically at the surgical site.  Decision was then made to apply Maitri Derm dermal allograft to promote wound healing given that the surgical site appeared very clean and healthy for graft application at this time.  A piece of Maitri Derm was then applied in double layer fashion over the large lateral wound.  This measured approximately 7 x 6.5 cm the postdebridement measurements of the wound.  The wound did probe to bone.  The graft was firmly adhered to the wound bed and moisturized with sterile saline and then stapled in place and was well adhered.  Next decision made to continue wound VAC therapy to promote granular tissue and fill in the wound bed.  The wound measured approximately 7 x 6.5 cm.  Black foam sponge was cut to these measurements and placed within the wound base.  This was then secured with derma tack wound VAC drape and the wound VAC was attached and set 125 mm mercury continuous suction with no leaks detected.  The surgical site was then dressed with 4 x 4 gauze Kerlix Ace roll. The patient tolerated both the procedure and anesthesia well with vital signs stable throughout. The patient was transferred in good condition and all vital signs stable  from the OR to recovery under the discretion of anesthesia.  Condition: Vital signs stable, neurovascular status unchanged from preoperative   Surgical plan:  Wound appeared very clean status post secondary irrigation debridement with no residual abscess noted.  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  He is at high risk for limb loss.  Will need long-term wound care for attempt at healing.  Home wound VAC with home health for twice weekly VAC changes.  Nonweightbearing left foot in postop shoe  The patient will be nonweightbearing in a postop shoe.  Dressing with wound VAC to the  operative limb until further instructed. The dressing is to remain clean, dry, and intact. Will continue to follow unless noted elsewhere.   Marsa Honour, DPM [facility]

## 2024-05-17 NOTE — Progress Notes (Signed)
 Hand off given over phone to OR nurse. Consent signed by patient and placed in chart. Sliding scale insulin  given and IV Unasyn  hung to infuse. CHG bath given. Oral antihypertensives and protonix  given with sip of water . Pt transported to OR by OR personnel via bed. Pt reports 8/10 left foot pain, but had refused any pain meds, but states he will take pain meds upon return as needed.

## 2024-05-17 NOTE — TOC Initial Note (Addendum)
 Transition of Care [facility]) - Initial/Assessment Note    Patient Details  Name: Clayton [name] MRN: [id number] Date of Birth: 17-Mar-1971  Transition of Care [facility]) CM/SW Contact:    Lauraine [name], LCSW Phone Number: 05/17/2024, 2:53 PM  Clinical Narrative:  CSW met with patient. No family at bedside. CSW introduced role and explained that discharge planning would be discussed. Patient will need a home wound vac and home health nursing at discharge. Patient does not currently have insurance. Made charity home health referral to [facility] as they are the assigned charity provider this week. They are reviewing. Left voicemail for [facility] liaison requesting call back for charity wound vac referral. Notes report that he will likely need long-term antibiotics. Patient believes they will likely be oral antibiotics. Team does not have a definite answer on that yet. The home health agency is aware and said he might require an LOG due to level of care needed. Will update as able. No further concerns. CSW will continue to follow patient for support and facilitate return home once stable.           4:31 pm: Received call back from Abigail with [employer]. She sent charity form for patient to fill out. CSW orientee provided it to him. Will follow up with patient tomorrow.  Expected Discharge Plan: Home w Home Health Services Barriers to Discharge: Continued Medical Work up   Patient Goals and CMS Choice            Expected Discharge Plan and Services     Post Acute Care Choice: Home Health, Durable Medical Equipment Living arrangements for the past 2 months: Single Family Home                                      Prior Living Arrangements/Services Living arrangements for the past 2 months: Single Family Home Lives with:: Spouse Patient language and need for interpreter reviewed:: Yes Do you feel safe going back to the place where you live?: Yes      Need for Family Participation in Patient Care:  Yes (Comment) Care giver support system in place?: Yes (comment)   Criminal Activity/Legal Involvement Pertinent to Current Situation/Hospitalization: No - Comment as needed  Activities of Daily Living   ADL Screening (condition at time of admission) Independently performs ADLs?: Yes (appropriate for developmental age) Is the patient deaf or have difficulty hearing?: No Does the patient have difficulty seeing, even when wearing glasses/contacts?: No Does the patient have difficulty concentrating, remembering, or making decisions?: No  Permission Sought/Granted Permission sought to share information with : Facility Industrial/product designer granted to share information with : Yes, Verbal Permission Granted     Permission granted to share info w AGENCY: Home Health and DME Agencies        Emotional Assessment Appearance:: Appears stated age Attitude/Demeanor/Rapport: Engaged, Gracious Affect (typically observed): Accepting, Appropriate, Calm, Pleasant Orientation: : Oriented to Self, Oriented to Place, Oriented to  Time, Oriented to Situation Alcohol / Substance Use: Not Applicable Psych Involvement: No (comment)  Admission diagnosis:  DKA (diabetic ketoacidosis) (HCC) [E11.10] Hyperglycemia [R73.9] Non compliance w medication regimen [Z91.148] Sepsis, due to unspecified organism, unspecified whether acute organ dysfunction present (HCC) [A41.9] Ulcer of left foot with fat layer exposed (HCC) [L97.522] Nausea and vomiting, unspecified vomiting type [R11.2] Patient Active Problem List   Diagnosis Date Noted   DKA (diabetic ketoacidosis) (HCC) 05/15/2024  Sepsis (HCC) 05/15/2024   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, with fat layer exposed (HCC) 05/15/2024   Essential hypertension 05/15/2024   Obesity (BMI 30-39.9) 05/15/2024   CKD stage 4 due to type 2 diabetes mellitus (HCC) 05/15/2024   Anemia of chronic disease 05/15/2024   Ulcer of left  foot with necrosis of bone (HCC) 05/15/2024   Acute renal failure superimposed on stage 3b chronic kidney disease (HCC) 09/27/2023   Uncontrolled type 2 diabetes mellitus with hyperglycemia, with long-term current use of insulin  (HCC) 09/27/2023   Acute bronchitis 09/27/2023   Acute respiratory failure with hypoxia (HCC) 09/27/2023   Acute asthma exacerbation 09/27/2023   HTN (hypertension) 09/27/2023   Bacteremia due to Gram-positive bacteria 04/19/2021   Hyponatremia with increased serum osmolality 04/19/2021   Acute urinary retention 04/19/2021   Hypertensive encephalopathy 04/19/2021   Seizure (HCC)    Hypertensive emergency 04/17/2021   Hypertensive urgency 04/17/2021   Nausea & vomiting 04/17/2021   Acute metabolic encephalopathy 04/17/2021   CKD (chronic kidney disease), stage IIIb 04/17/2021   Type II diabetes mellitus with renal manifestations (HCC) 04/17/2021   Leukocytosis 04/17/2021   Elevated lactic acid level 04/17/2021   Elevated troponin 04/17/2021   PCP:  Arden Fairy Lot, MD Pharmacy:   [id number] [address], [place] - [address] AT [facility] [address] [place] [address] Phone: [phone] Fax: [phone]     Social Drivers of Health (SDOH) Social History: SDOH Screenings   Food Insecurity: No Food Insecurity (05/15/2024)  Housing: Low Risk  (05/15/2024)  Transportation Needs: No Transportation Needs (05/15/2024)  Utilities: Not At Risk (05/15/2024)  Financial Resource Strain: Low Risk  (07/07/2023)   Received from [facility]  Tobacco Use: Low Risk  (05/17/2024)   SDOH Interventions:     Readmission Risk Interventions     No data to display

## 2024-05-17 NOTE — Transfer of Care (Signed)
 Immediate Anesthesia Transfer of Care Note  Patient: Clayton [name]  Procedure(s) Performed: IRRIGATION AND DEBRIDEMENT FOOT (Left: Foot) GRAFT APPLICATION (Left: Foot) APPLICATION, WOUND VAC (Left: Foot)  Patient Location: PACU  Anesthesia Type:MAC  Level of Consciousness: awake, drowsy, patient cooperative, and responds to stimulation  Airway & Oxygen Therapy: Patient Spontanous Breathing  Post-op Assessment: Report given to RN and Post -op Vital signs reviewed and stable  Post vital signs: Reviewed  Last Vitals:  Vitals Value Taken Time  BP 111/62 05/17/24 11:00  Temp    Pulse 70 05/17/24 11:03  Resp 19 05/17/24 11:03  SpO2 95 % 05/17/24 11:03  Vitals shown include unfiled device data.  Last Pain:  Vitals:   05/17/24 0929  TempSrc: Temporal  PainSc: 8       Patients Stated Pain Goal: 2 (05/15/24 1144)  Complications: There were no known notable events for this encounter.

## 2024-05-18 ENCOUNTER — Encounter: Payer: Self-pay | Admitting: Podiatry

## 2024-05-18 ENCOUNTER — Inpatient Hospital Stay: Payer: MEDICAID

## 2024-05-18 DIAGNOSIS — B954 Other streptococcus as the cause of diseases classified elsewhere: Secondary | ICD-10-CM

## 2024-05-18 DIAGNOSIS — L97422 Non-pressure chronic ulcer of left heel and midfoot with fat layer exposed: Secondary | ICD-10-CM

## 2024-05-18 DIAGNOSIS — B9561 Methicillin susceptible Staphylococcus aureus infection as the cause of diseases classified elsewhere: Secondary | ICD-10-CM

## 2024-05-18 DIAGNOSIS — M86172 Other acute osteomyelitis, left ankle and foot: Secondary | ICD-10-CM

## 2024-05-18 DIAGNOSIS — Z794 Long term (current) use of insulin: Secondary | ICD-10-CM

## 2024-05-18 DIAGNOSIS — E11621 Type 2 diabetes mellitus with foot ulcer: Secondary | ICD-10-CM

## 2024-05-18 LAB — CBC
HCT: 26.1 % — ABNORMAL LOW (ref 39.0–52.0)
Hemoglobin: 8.3 g/dL — ABNORMAL LOW (ref 13.0–17.0)
MCH: 29.5 pg (ref 26.0–34.0)
MCHC: 31.8 g/dL (ref 30.0–36.0)
MCV: 92.9 fL (ref 80.0–100.0)
Platelets: 436 K/uL — ABNORMAL HIGH (ref 150–400)
RBC: 2.81 MIL/uL — ABNORMAL LOW (ref 4.22–5.81)
RDW: 12.1 % (ref 11.5–15.5)
WBC: 18 K/uL — ABNORMAL HIGH (ref 4.0–10.5)
nRBC: 0 % (ref 0.0–0.2)

## 2024-05-18 LAB — BASIC METABOLIC PANEL WITH GFR
Anion gap: 8 (ref 5–15)
BUN: 31 mg/dL — ABNORMAL HIGH (ref 6–20)
CO2: 22 mmol/L (ref 22–32)
Calcium: 8 mg/dL — ABNORMAL LOW (ref 8.9–10.3)
Chloride: 101 mmol/L (ref 98–111)
Creatinine, Ser: 2.67 mg/dL — ABNORMAL HIGH (ref 0.61–1.24)
GFR, Estimated: 28 mL/min — ABNORMAL LOW (ref >60)
Glucose, Bld: 212 mg/dL — ABNORMAL HIGH (ref 70–99)
Potassium: 3.9 mmol/L (ref 3.5–5.1)
Sodium: 131 mmol/L — ABNORMAL LOW (ref 135–145)

## 2024-05-18 LAB — GLUCOSE, CAPILLARY
Glucose-Capillary: 190 mg/dL — ABNORMAL HIGH (ref 70–99)
Glucose-Capillary: 284 mg/dL — ABNORMAL HIGH (ref 70–99)
Glucose-Capillary: 266 mg/dL — ABNORMAL HIGH (ref 70–99)
Glucose-Capillary: 225 mg/dL — ABNORMAL HIGH (ref 70–99)

## 2024-05-18 MED ORDER — INSULIN GLARGINE-YFGN 100 UNIT/ML ~~LOC~~ SOLN
30.0000 [IU] | Freq: Every day | SUBCUTANEOUS | Status: DC
  Administered 2024-05-18 – 2024-05-19 (×2): 30 [IU] via SUBCUTANEOUS
  Filled 2024-05-18 (×2): qty 0.3

## 2024-05-18 MED ORDER — SODIUM CHLORIDE 0.9 % IV SOLN: INTRAVENOUS | Status: DC

## 2024-05-18 NOTE — Consult Note (Addendum)
 [facility]  CONSULT NOTE    Date: 05/18/2024                  Patient Name:  Clayton [name]  MRN: [id number]  DOB: Dec 09, 1970  Age / Sex: 53 y.o., male         PCP: Arden Fairy Lot, MD                 Service Requesting Consult: [facility]                 Reason for Consult: Acute kidney injury            History of Present Illness: Clayton [name] is a 53 y.o.  male with past medical conditions including diabetes, hypertension, chronic kidney disease stage IV, who was admitted to [facility] on 05/15/2024 for DKA (diabetic ketoacidosis) (HCC) [E11.10] Hyperglycemia [R73.9] Non compliance w medication regimen [Z91.148] Sepsis, due to unspecified organism, unspecified whether acute organ dysfunction present [facility]) [A41.9] Ulcer of left foot with fat layer exposed (HCC) [L97.522] Nausea and vomiting, unspecified vomiting type [R11.2]  Patient presented to the emergency department with complaints of abdominal pain with nausea vomiting.  Was found to be in DKA and placed on insulin  drip.  Though symptoms have improved over the course of this admission.  Patient also noted to have diabetic foot ulcer and underwent I&D on 7/28 and 7/31.  Patient noted to have kidney disease without outpatient nephrologist.  Creatinine on arrival 2.56, peaked at 3.04 yesterday.  Hemoglobin has steadily declined over this admission, 8.3 today.  White count also elevated, peaked at 23.2 during this admission.  Renal ultrasound negative for obstruction, bilateral ureteral jets noted.  Left MRI shows no definitive diagnosis of osteomyelitis.   Medications: Outpatient medications: Medications Prior to Admission  Medication Sig Dispense Refill Last Dose/Taking   albuterol  (VENTOLIN  HFA) 108 (90 Base) MCG/ACT inhaler Inhale 2 puffs into the lungs every 6 (six) hours as needed for wheezing or shortness of breath. 8 g 2 Unknown   carvedilol  (COREG ) 25 MG tablet Take 1 tablet (25 mg total) by  mouth 2 (two) times daily. 180 tablet 0 05/17/2024 at  9:09 AM   Continuous Glucose Sensor (FREESTYLE LIBRE 3 SENSOR) MISC :1 Each Every 2 Weeks   Past Week   empagliflozin (JARDIANCE) 25 MG TABS tablet Take 1 tablet by mouth daily.   Past Week   Insulin  Lispro Prot & Lispro (HUMALOG  75/25 MIX) (75-25) 100 UNIT/ML Kwikpen Inject 15 Units into the skin 2 (two) times daily. Can give equivalent if covered by insurance.  Pls provide needles. 27 mL 0 Past Week   rosuvastatin  (CRESTOR ) 20 MG tablet Take 20 mg by mouth daily.   Past Week   Vitamin D , Ergocalciferol , (DRISDOL) 1.25 MG (50000 UNIT) CAPS capsule Take 50,000 Units by mouth once a week.   Past Week   amLODipine  (NORVASC ) 10 MG tablet Take 1 tablet (10 mg total) by mouth daily. 90 tablet 0    benzonatate  (TESSALON ) 100 MG capsule Take 1 capsule (100 mg total) by mouth 3 (three) times daily as needed for cough. (Patient not taking: Reported on 05/15/2024) 20 capsule 0 Not Taking   loratadine  (CLARITIN ) 10 MG tablet Take 1 tablet (10 mg total) by mouth at bedtime for 10 days. 10 tablet 0    losartan  (COZAAR ) 100 MG tablet Take 1 tablet (100 mg total) by mouth daily. 90 tablet 0    metFORMIN  (GLUMETZA ) 1000 MG (  MOD) 24 hr tablet Take 1 tablet (1,000 mg total) by mouth 2 (two) times daily with a meal. (Patient not taking: Reported on 05/15/2024) 60 tablet 11 Not Taking   pantoprazole  (PROTONIX ) 40 MG tablet Take 1 tablet (40 mg total) by mouth daily for 10 days. 10 tablet 0     Current medications: Current Facility-Administered Medications  Medication Dose Route Frequency Provider Last Rate Last Admin   0.9 %  sodium chloride  infusion   Intravenous Continuous Druscilla Bald, NP 75 mL/hr at 05/18/24 0955 New Bag at 05/18/24 0955   acetaminophen  (TYLENOL ) tablet 650 mg  650 mg Oral Q6H PRN Agbata, Tochukwu, MD   650 mg at 05/17/24 0257   Or   acetaminophen  (TYLENOL ) suppository 650 mg  650 mg Rectal Q6H PRN Agbata, Tochukwu, MD       alum & mag  hydroxide-simeth (MAALOX/MYLANTA) 200-200-20 MG/5ML suspension 30 mL  30 mL Oral Q4H PRN Mansy, Jan A, MD   30 mL at 05/17/24 1317   amLODipine  (NORVASC ) tablet 10 mg  10 mg Oral Daily Agbata, Tochukwu, MD   10 mg at 05/18/24 9041   Ampicillin -Sulbactam (UNASYN ) 3 g in sodium chloride  0.9 % 100 mL IVPB  3 g Intravenous Q6H Chappell, Alex B, RPH 200 mL/hr at 05/18/24 0957 3 g at 05/18/24 0957   ascorbic acid  (VITAMIN C ) tablet 500 mg  500 mg Oral BID Agbata, Tochukwu, MD   500 mg at 05/18/24 0957   carvedilol  (COREG ) tablet 25 mg  25 mg Oral BID WC Agbata, Tochukwu, MD   25 mg at 05/18/24 9041   Chlorhexidine  Gluconate Cloth 2 % PADS 6 each  6 each Topical Daily Agbata, Tochukwu, MD   6 each at 05/18/24 0958   dextrose  50 % solution 0-50 mL  0-50 mL Intravenous PRN Agbata, Tochukwu, MD       feeding supplement (NEPRO CARB STEADY) liquid 237 mL  237 mL Oral BID BM Agbata, Tochukwu, MD   237 mL at 05/18/24 1004   heparin  injection 5,000 Units  5,000 Units Subcutaneous Q8H Agbata, Tochukwu, MD   5,000 Units at 05/18/24 1317   HYDROmorphone  (DILAUDID ) injection 1 mg  1 mg Intravenous Q4H PRN Agbata, Tochukwu, MD   1 mg at 05/18/24 1318   insulin  aspart (novoLOG ) injection 0-15 Units  0-15 Units Subcutaneous TID WC Agbata, Tochukwu, MD   8 Units at 05/18/24 1227   insulin  glargine-yfgn (SEMGLEE ) injection 30 Units  30 Units Subcutaneous Daily Agbata, Tochukwu, MD   30 Units at 05/18/24 0959   linezolid  (ZYVOX ) IVPB 600 mg  600 mg Intravenous Q12H Agbata, Tochukwu, MD 300 mL/hr at 05/18/24 1053 600 mg at 05/18/24 1053   multivitamin with minerals tablet 1 tablet  1 tablet Oral Daily Agbata, Tochukwu, MD   1 tablet at 05/18/24 9042   ondansetron  (ZOFRAN ) tablet 4 mg  4 mg Oral Q6H PRN Agbata, Tochukwu, MD       Or   ondansetron  (ZOFRAN ) injection 4 mg  4 mg Intravenous Q6H PRN Agbata, Tochukwu, MD   4 mg at 05/15/24 1719   Oral care mouth rinse  15 mL Mouth Rinse PRN Mansy, Jan A, MD       oxyCODONE  (Oxy  IR/ROXICODONE ) immediate release tablet 5 mg  5 mg Oral Q6H PRN Agbata, Tochukwu, MD   5 mg at 05/18/24 1227   pantoprazole  (PROTONIX ) EC tablet 40 mg  40 mg Oral Daily Chappell, Alex B, RPH   40 mg at 05/18/24  9041   phenol (CHLORASEPTIC) mouth spray 1 spray  1 spray Mouth/Throat PRN Mansy, Jan A, MD   1 spray at 05/16/24 0415   pneumococcal 20-valent conjugate vaccine (PREVNAR 20) injection 0.5 mL  0.5 mL Intramuscular Tomorrow-1000 Mansy, Jan A, MD       Tdap (BOOSTRIX ) injection 0.5 mL  0.5 mL Intramuscular Once Nicholaus Rolland BRAVO, MD       thiamine  (VITAMIN B1) tablet 100 mg  100 mg Oral Daily Agbata, Tochukwu, MD   100 mg at 05/18/24 9041   zinc  sulfate (50mg  elemental zinc ) capsule 220 mg  220 mg Oral Daily Agbata, Tochukwu, MD   220 mg at 05/18/24 9041      Allergies: No Known Allergies    Past Medical History: Past Medical History:  Diagnosis Date   Asthma    CKD (chronic kidney disease), stage III (HCC)    Hypertension    Type II diabetes mellitus with renal manifestations (HCC)      Past Surgical History: Past Surgical History:  Procedure Laterality Date   APPLICATION OF WOUND VAC Left 05/15/2024   Procedure: APPLICATION, WOUND VAC;  Surgeon: Malvin Marsa FALCON, DPM;  Location: [facility];  Service: Orthopedics/Podiatry;  Laterality: Left;   APPLICATION OF WOUND VAC Left 05/17/2024   Procedure: APPLICATION, WOUND VAC;  Surgeon: Malvin Marsa FALCON, DPM;  Location: [facility];  Service: Orthopedics/Podiatry;  Laterality: Left;   CATARACT EXTRACTION     GRAFT APPLICATION Left 05/17/2024   Procedure: GRAFT APPLICATION;  Surgeon: Malvin Marsa FALCON, DPM;  Location: [facility];  Service: Orthopedics/Podiatry;  Laterality: Left;   IRRIGATION AND DEBRIDEMENT FOOT Left 05/15/2024   Procedure: IRRIGATION AND DEBRIDEMENT FOOT;  Surgeon: Malvin Marsa FALCON, DPM;  Location: [facility];  Service: Orthopedics/Podiatry;  Laterality: Left;  WITH BONE BIOPSY   IRRIGATION AND  DEBRIDEMENT FOOT Left 05/17/2024   Procedure: IRRIGATION AND DEBRIDEMENT FOOT;  Surgeon: Malvin Marsa FALCON, DPM;  Location: [facility];  Service: Orthopedics/Podiatry;  Laterality: Left;     Family History: Family History  Problem Relation Age of Onset   Stroke Mother    Hypertension Mother    Hypertension Father      Social History: Social History   Socioeconomic History   Marital status: Married    Spouse name: Not on file   Number of children: Not on file   Years of education: Not on file   Highest education level: Not on file  Occupational History   Not on file  Tobacco Use   Smoking status: Never   Smokeless tobacco: Never  Substance and Sexual Activity   Alcohol use: Yes   Drug use: Not Currently   Sexual activity: Yes  Other Topics Concern   Not on file  Social History Narrative   Not on file   Social Drivers of Health   Financial Resource Strain: Low Risk  (07/07/2023)   Received from [facility]   Overall Financial Resource Strain (CARDIA)    Difficulty of Paying Living Expenses: Not very hard  Food Insecurity: No Food Insecurity (05/15/2024)   Hunger Vital Sign    Worried About Running Out of Food in the Last Year: Never true    Ran Out of Food in the Last Year: Never true  Transportation Needs: No Transportation Needs (05/15/2024)   PRAPARE - Administrator, Civil Service (Medical): No    Lack of Transportation (Non-Medical): No  Physical Activity: Not on file  Stress: Not on file  Social Connections: Not on file  Intimate Partner Violence: Not At Risk (05/15/2024)   Humiliation, Afraid, Rape, and Kick questionnaire    Fear of Current or Ex-Partner: No    Emotionally Abused: No    Physically Abused: No    Sexually Abused: No     Review of Systems: Review of Systems  Constitutional:  Negative for chills, fever and malaise/fatigue.  HENT:  Negative for congestion, sore throat and tinnitus.   Eyes:  Negative for  blurred vision and redness.  Respiratory:  Negative for cough, shortness of breath and wheezing.   Cardiovascular:  Negative for chest pain, palpitations, claudication and leg swelling.  Gastrointestinal:  Positive for abdominal pain, nausea and vomiting. Negative for blood in stool and diarrhea.  Genitourinary:  Negative for flank pain, frequency and hematuria.  Musculoskeletal:  Negative for back pain, falls and myalgias.  Skin:  Negative for rash.  Neurological:  Negative for dizziness, weakness and headaches.  Endo/Heme/Allergies:  Does not bruise/bleed easily.  Psychiatric/Behavioral:  Negative for depression. The patient is not nervous/anxious and does not have insomnia.     Vital Signs: Blood pressure 137/86, pulse 82, temperature 98.2 F (36.8 C), resp. rate 18, height 5' 11 (1.803 m), weight 114.1 kg, SpO2 98%.  Weight trends: Filed Weights   05/17/24 0304 05/17/24 0929 05/18/24 0427  Weight: 113.9 kg 113.9 kg 114.1 kg    Physical Exam: General: NAD,   Head: Normocephalic, atraumatic. Moist oral mucosal membranes  Eyes: Anicteric  Neck: Supple  Lungs:  Clear to auscultation, room air  Heart: Regular rate and rhythm  Abdomen:  Soft, nontender,   Extremities: No peripheral edema.  Neurologic: Alert and oriented  Skin: No lesions        Lab results: Basic Metabolic Panel: Recent Labs  Lab 05/16/24 1110 05/17/24 0328 05/18/24 1008  NA 131* 129* 131*  K 4.4 3.7 3.9  CL 97* 98 101  CO2 22 23 22   GLUCOSE 260* 235* 212*  BUN 32* 37* 31*  CREATININE 2.99* 3.04* 2.67*  CALCIUM  8.5* 7.8* 8.0*  MG  --  2.1  --   PHOS  --  2.8  --     Liver Function Tests: Recent Labs  Lab 05/15/24 1155  AST 29  ALT 22  ALKPHOS 77  BILITOT 2.2*  PROT 7.3  ALBUMIN 2.7*   Recent Labs  Lab 05/15/24 1155  LIPASE 24   No results for input(s): AMMONIA in the last 168 hours.  CBC: Recent Labs  Lab 05/15/24 1155 05/16/24 0405 05/18/24 1008  WBC 23.2* 19.0* 18.0*   HGB 10.5* 9.6* 8.3*  HCT 32.6* 28.8* 26.1*  MCV 90.1 90.6 92.9  PLT 368 326 436*    Cardiac Enzymes: No results for input(s): CKTOTAL, CKMB, CKMBINDEX, TROPONINI in the last 168 hours.  BNP: Invalid input(s): POCBNP  CBG: Recent Labs  Lab 05/17/24 1325 05/17/24 1649 05/17/24 2154 05/18/24 0738 05/18/24 1212  GLUCAP 214* 241* 201* 190* 284*    Microbiology: Results for orders placed or performed during the hospital encounter of 05/15/24  Blood culture (routine x 2)     Status: None (Preliminary result)   Collection Time: 05/15/24 12:58 PM   Specimen: BLOOD  Result Value Ref Range Status   Specimen Description BLOOD BLOOD RIGHT HAND  Final   Special Requests   Final    BOTTLES DRAWN AEROBIC AND ANAEROBIC Blood Culture results may not be optimal due to an inadequate volume of blood received in culture bottles  Culture   Final    NO GROWTH 3 DAYS Performed at [facility], [address] [place], [place] [address]    Report Status PENDING  Incomplete  Blood culture (routine x 2)     Status: None (Preliminary result)   Collection Time: 05/15/24 12:58 PM   Specimen: BLOOD  Result Value Ref Range Status   Specimen Description BLOOD LEFT ANTECUBITAL  Final   Special Requests   Final    BOTTLES DRAWN AEROBIC AND ANAEROBIC Blood Culture results may not be optimal due to an inadequate volume of blood received in culture bottles   Culture   Final    NO GROWTH 3 DAYS Performed at [facility], [address], [place], [place] [address]    Report Status PENDING  Incomplete  Aerobic/Anaerobic Culture w Gram Stain (surgical/deep wound)     Status: None (Preliminary result)   Collection Time: 05/15/24  5:23 PM   Specimen: Wound; Tissue  Result Value Ref Range Status   Specimen Description   Final    WOUND Performed at [facility], [address], [place], [place] [address]    Special Requests   Final     LEFT FOOT WOUND  TISSUE Performed at [facility], [address] [place], [place] [address]    Gram Stain   Final    FEW WBC PRESENT, PREDOMINANTLY PMN MODERATE GRAM POSITIVE COCCI MODERATE GRAM NEGATIVE RODS    Culture   Final    ABUNDANT STAPHYLOCOCCUS AUREUS ABUNDANT STREPTOCOCCUS MITIS/ORALIS CONFIRMATION OF SUSCEPTIBILITIES IN PROGRESS Performed at [facility], [address] [place], [place] [address]    Report Status PENDING  Incomplete   Organism ID, Bacteria STAPHYLOCOCCUS AUREUS  Final      Susceptibility   Staphylococcus aureus - MIC*    CIPROFLOXACIN <=0.5 SENSITIVE Sensitive     ERYTHROMYCIN <=0.25 SENSITIVE Sensitive     GENTAMICIN  <=0.5 SENSITIVE Sensitive     OXACILLIN 0.5 SENSITIVE Sensitive     TETRACYCLINE <=1 SENSITIVE Sensitive     VANCOMYCIN  <=0.5 SENSITIVE Sensitive     TRIMETH/SULFA <=10 SENSITIVE Sensitive     CLINDAMYCIN <=0.25 SENSITIVE Sensitive     RIFAMPIN <=0.5 SENSITIVE Sensitive     Inducible Clindamycin NEGATIVE Sensitive     LINEZOLID  2 SENSITIVE Sensitive     * ABUNDANT STAPHYLOCOCCUS AUREUS  Aerobic/Anaerobic Culture w Gram Stain (surgical/deep wound)     Status: None (Preliminary result)   Collection Time: 05/15/24  5:30 PM   Specimen: Wound; Tissue  Result Value Ref Range Status   Specimen Description   Final    WOUND Performed at [facility], [address] [place], [place] [address]    Special Requests   Final     LEFT FOOT CUBOID BONE Performed at [facility], [address] [place], [place] [address]    Gram Stain   Final    NO WBC SEEN RARE GRAM POSITIVE COCCI RARE GRAM NEGATIVE RODS    Culture   Final    CULTURE REINCUBATED FOR BETTER GROWTH Performed at [facility], [address], [place], [place] [address]    Report Status PENDING  Incomplete  MRSA Next Gen by PCR, Nasal     Status: None   Collection Time: 05/15/24  6:51 PM   Specimen: Nasal Mucosa; Nasal Swab  Result Value Ref  Range Status   MRSA by PCR Next Gen NOT DETECTED NOT DETECTED Final  Comment: (NOTE) The GeneXpert MRSA Assay (FDA approved for NASAL specimens only), is one component of a comprehensive MRSA colonization surveillance program. It is not intended to diagnose MRSA infection nor to guide or monitor treatment for MRSA infections. Test performance is not FDA approved in patients less than 53 years old. Performed at [facility], [address] [place], [place] [address]     Coagulation Studies: No results for input(s): LABPROT, INR in the last 72 hours.  Urinalysis: Recent Labs    05/15/24 1410  COLORURINE YELLOW*  LABSPEC 1.020  PHURINE 5.0  GLUCOSEU >=500*  HGBUR MODERATE*  BILIRUBINUR NEGATIVE  KETONESUR 5*  PROTEINUR >=300*  NITRITE NEGATIVE  LEUKOCYTESUR NEGATIVE      Imaging: US  RENAL Result Date: 05/18/2024 CLINICAL DATA:  Acute kidney failure EXAM: RENAL / URINARY TRACT ULTRASOUND COMPLETE COMPARISON:  CT 04/17/2021 FINDINGS: Right Kidney: Renal measurements: 10.1 x 4.8 x 5 cm = volume: 125 mL. Echogenicity within normal limits. No mass or hydronephrosis visualized. Left Kidney: Renal measurements: 10.6 x 5.4 x 4.8 cm = volume: 144 mL. Echogenicity within normal limits. No mass or hydronephrosis visualized. Bladder: Incompletely distended.  Bilateral ureteral jets noted. Other: None. IMPRESSION: Negative. Electronically Signed   By: JONETTA Faes M.D.   On: 05/18/2024 11:39     Assessment & Plan: Mr. Randi College is a 53 y.o.  male with past medical concerns including diabetes, hypertension, and chronic kidney disease stage IV, who was admitted to [facility] on 05/15/2024 for DKA (diabetic ketoacidosis) (HCC) [E11.10] Hyperglycemia [R73.9] Non compliance w medication regimen [Z91.148] Sepsis, due to unspecified organism, unspecified whether acute organ dysfunction present (HCC) [A41.9] Ulcer of left foot with fat layer exposed (HCC) [L97.522] Nausea and  vomiting, unspecified vomiting type [R11.2]  Acute kidney injury on chronic kidney disease stage IV.  Baseline appears to be 2.58 with GFR 29 on 09/28/2023.  Patient does not follow with outpatient nephrology.  Renal ultrasound negative for obstruction, bilateral ureteral jets noted. Acute kidney injury likely secondary to dehydration. Chronic kidney disease secondary to uncontrolled diabetes and hypertension.  No acute indication for dialysis today.Kidney function have returned to baseline.  Will order gentle IV hydration to support recovery.  Will continue to follow patient during this admission and outpatient at discharge.  2. Anemia of chronic kidney disease normocytic Lab Results  Component Value Date   HGB 8.3 (L) 05/18/2024  Hemoglobin decreased, can consider ESA as outpatient.  3. Secondary Hyperparathyroidism: with outpatient labs: None available Lab Results  Component Value Date   CALCIUM  8.0 (L) 05/18/2024   PHOS 2.8 05/17/2024    Will continue to monitor bone minerals.  Will order PTH in AM.  4.  Hypertension with chronic kidney disease.  Home regimen includes amlodipine , carvedilol , losartan .  Currently receiving amlodipine  and carvedilol .  5.  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 Home regimen includes Jardiance, Humalog , and metformin . Most recent hemoglobin A1c is 11.1 on 05/15/24.  Placed on insulin  drip on admission.   LOS: 3 Bertice Risse 7/31/20251:21 PM

## 2024-05-18 NOTE — Progress Notes (Signed)
 Progress Note   Patient: Clayton [name] FMW:[id number] DOB: December 16, 1970 DOA: 05/15/2024     3 DOS: the patient was seen and examined on 05/18/2024   Brief hospital course:  Clayton [name] is a 53 y.o. 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  Patient states that he has had symptoms for about a week and has not been able to tolerate any oral intake.  He has not taken his insulin  in 1 week due to the fact that his oral intake has been poor.  He complains about pain mostly in the periumbilical area.  He rates his pain a 7 x 10 in intensity at its worst.  Pain is nonradiating.  He denies having any diarrhea.  Emesis is mostly fluid and bile.  Has not contained any blood or coffee-ground. He states that about a week and half ago he had stepped on an object and developed a blister involving his left foot.  He notes that the blister popped and he now has a large ulceration involving the left lateral portion of his left foot with foul-smelling drainage.  He denies having any fever or chills. He denies having any chest pain, no shortness of breath, no headache, no dizziness, no lightheadedness, no blurred vision, no urinary symptoms, no focal deficit. Abnormal labs include a sodium of 128, glucose 447, BUN 25, creatinine 2.56, total bilirubin 2.2, albumin 2.7, white count 23.2, hemoglobin 10.5 Left foot x-ray shows soft tissue thickening and subcutaneous emphysema along the lateral mid and plantar hindfoot, consistent with known ulcer. No radiographic evidence of osteomyelitis. Patient received 2 L IV fluid bolus in the ER as well as a dose of vancomycin  and Zosyn . He also received Tdap He will be admitted to the hospital for further evaluation.       Assessment and Plan:  *  DKA (diabetic ketoacidosis) (HCC) Resolved Present on admission and secondary to noncompliance to prescribed insulin  Noted to have an anion gap of 18 and hyperglycemia with serum glucose greater than 400 associated with refractory nausea and vomiting Patient was started on insulin  drip per protocol but has been transitioned to long-acting and Premeal insulin  Increase Lantus  30 units daily and Premeal insulin       Uncontrolled type 2 diabetes mellitus with hyperglycemia, with long-term current use of insulin  (HCC) Secondary to medication noncompliance Diabetic education Continue long-acting and Premeal insulin      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 No radiographic evidence of osteomyelitis. MRI of the foot showed a large open wound on the plantar and lateral aspect of the midfoot hindfoot junction region. Diffuse cellulitis and myofasciitis without definite findings for pyomyositis. Fluid collection wrapping around the bases of the fourth and fifth metatarsals dorsally could be postoperative fluid collection or dissecting abscess. 14 x 13 mm cystic area in the plantar subcutaneous fat slightly more distally. I do not see any surrounding inflammatory changes to suggest this is an abscess but a small abscess is possible. No definite MR findings to suggest osteomyelitis or septic arthritis. Appreciate podiatry input.  Patient is status post irrigation and excisional debridement of ulceration to bone level, 6 x 7.5 cm, left foot. Bone biopsy from cuboid via trocar, left foot Application antibiotic drug delivery device, left foot. Application negative pressure wound therapy 6  x 7.5 cm, left foot 07/30 - Patient is status post irrigation and debridement of ulceration to the level of bone with prep for graft, left foot. Application Maitri Derm dermal allograft, 7 x 6.5 cm, left foot Application negative pressure wound therapy, 7 x 6.5 cm, left foot. Continue empiric antibiotic therapy with Unasyn  and linezolid  Wound cultures yield abundant Staph aureus and Streptococcus mitis/oralis Surgical pathology is consistent with acute osteomyelitis ID consult to recommend antibiotic therapy as well as duration of treatment    Sepsis (HCC) Resolved Present on admission secondary to infected diabetic foot wound As evidenced by fever with a Tmax of 101.3, tachycardia, tachypnea, marked leukocytosis Continue Linezolid  and Unasyn  Blood cultures showed no growth in 2 days but wound cultures yield Staph aureus and Streptococcus mitis/oralis Appreciate podiatry input Consult ID     Iron deficiency anemia of chronic disease Noted to have a drop in his H&H from 15g/dl >> 89h/io No obvious source of blood loss at this time      AKI superimposed on CKD stage 4 due to type 2 diabetes mellitus (HCC) Noted to have stage IV chronic kidney disease secondary to uncontrolled diabetes mellitus Noted to have slight worsening of his renal function with a peak in his serum creatinine at 3.04.  Currently shows a downward trend and is almost at baseline okay looks like he just responded close and Will consult nephrology   Obesity (BMI 30-39.9) Patient with class I obesity, BMI of 33 Complicates overall prognosis and care Lifestyle modification and exercise has been discussed with patient in detail   Essential hypertension Will continue amlodipine  and carvedilol    Hyponatremia with increased serum osmolality Secondary to hyperglycemia related to DKA Expect improvement with IV fluid hydration Improved with IV fluid hydration           Subjective: No new complaints  Physical  Exam: Vitals:   05/17/24 2352 05/18/24 0349 05/18/24 0427 05/18/24 0731  BP: 135/70 (!) 147/80  137/86  Pulse: 73 72  82  Resp: 18 18  18   Temp: 98.7 F (37.1 C) 98.3 F (36.8 C)  98.2 F (36.8 C)  TempSrc:      SpO2: 99% 100%  98%  Weight:   114.1 kg   Height:       Comments: Appears comfortable HENT:     Head: Normocephalic and atraumatic.     Mouth/Throat:     Comments: Moist mucous membranes Eyes:     Comments: Pale conjunctiva  Cardiovascular:     Rate and Rhythm: Regular, rate and rhythm Pulmonary:     Effort: Pulmonary effort is normal.     Breath sounds: Normal breath sounds.  Abdominal:     General: Bowel sounds are normal.     Tenderness: Nontender    Comments: Central adiposity, nontender, nondistended  Skin:    General: Skin is warm and dry.  Neurological:     Motor: Weakness present.  Psychiatric:        Mood and Affect: Mood normal.        Behavior: Behavior normal.     Data Reviewed: White count 18.0, hemoglobin 8.3, sodium 131, creatinine 2.67 Labs reviewed  Family Communication: Plan of care was discharged with patient at the bedside on his  wife over the phone.  She request his FMLA papers to be done.  Disposition: Status is: Inpatient Remains inpatient appropriate because: On IV antibiotics for acute osteomyelitis  Planned Discharge Destination: Home    Time spent: 40  minutes  Author: Aimee Somerset, MD 05/18/2024 12:20 PM  For on call review [email or web address].

## 2024-05-18 NOTE — TOC Progression Note (Addendum)
 Transition of Care [facility]) - Progression Note    Patient Details  Name: Clayton [name] MRN: [id number] Date of Birth: Jan 22, 1971  [facility] of [facility]) CM/SW Contact  Lauraine [name], LCSW Phone Number: 05/18/2024, 12:36 PM  Clinical Narrative:  CSW faxed clinicals to [employer] liaison for charity wound vac. Hedda is requesting an LOG for $175 per visit. TOC director is off today. Sent secure chat to [facility] supervisor to see if she can approve that. Wife is aware.   2:49 pm: Librarian, academic IP Care Management approved LOG. Information sent to Jolynn Pack CMA to complete and send to [facility] liaison. Wife is aware.   Expected Discharge Plan: Home w Home Health Services Barriers to Discharge: Continued Medical Work up               Expected Discharge Plan and Services     Post Acute Care Choice: Home Health, Durable Medical Equipment Living arrangements for the past 2 months: Single Family Home                                       Social Drivers of Health (SDOH) Interventions SDOH Screenings   Food Insecurity: No Food Insecurity (05/15/2024)  Housing: Low Risk  (05/15/2024)  Transportation Needs: No Transportation Needs (05/15/2024)  Utilities: Not At Risk (05/15/2024)  Financial Resource Strain: Low Risk  (07/07/2023)   Received from [facility]  Tobacco Use: Low Risk  (05/17/2024)    Readmission Risk Interventions     No data to display

## 2024-05-18 NOTE — Consult Note (Addendum)
 NAME: Clayton [name]  DOB: 05/22/1971  MRN: [id number]  Date/Time: 05/18/2024 12:55 PM  REQUESTING PROVIDER: Dr.Agbata Subjective:  REASON FOR CONSULT: left foot infection ? Clayton [name] is a 53 y.o. male with a history of history of hypertension, diabetes mellitus , CKD, obesity presented to the ED for abdominal pain , nausea and vomiting on 05/15/24. Found to be in DKA Also noted to have a  wound on the left foot which started as a blister 2 weeks ago . He is not sure how it started. 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  Resp 18  SpO2 97 %      Latest Reference Range & Units 05/15/24 11:55  WBC 4.0 - 10.5 K/uL 23.2 (H)  Hemoglobin 13.0 - 17.0 g/dL 89.4 (L)  HCT 60.9 - 47.9 % 32.6 (L)  Platelets 150 - 400 K/uL 368  Creatinine 0.61 - 1.24 mg/dL 9.38 - 8.75 mg/dL 7.46 (H) 7.43 (H)    Blood culture sent and started on unasyn  and linezolid - had I/D of the ulcer upto bone and biopsy of the cuboid bone was done. That shows osteo Culture is MSSA and strep mitis I am asked to see the patient for antibiotic management of the wound  Past Medical History:  Diagnosis Date   Asthma    CKD (chronic kidney disease), stage III (HCC)    Hypertension    Type II diabetes mellitus with renal manifestations [facility])     Past Surgical History:  Procedure Laterality Date   APPLICATION OF WOUND VAC Left 05/15/2024   Procedure: APPLICATION, WOUND VAC;  Surgeon: Malvin Marsa FALCON, DPM;  Location: [facility] ORS;  Service: Orthopedics/Podiatry;  Laterality: Left;   APPLICATION OF WOUND VAC Left 05/17/2024   Procedure: APPLICATION, WOUND VAC;  Surgeon: Malvin Marsa FALCON, DPM;  Location: [facility] ORS;  Service: Orthopedics/Podiatry;  Laterality: Left;   CATARACT EXTRACTION     GRAFT APPLICATION Left 05/17/2024   Procedure: GRAFT APPLICATION;  Surgeon: Malvin Marsa FALCON, DPM;  Location: [facility] ORS;  Service:  Orthopedics/Podiatry;  Laterality: Left;   IRRIGATION AND DEBRIDEMENT FOOT Left 05/15/2024   Procedure: IRRIGATION AND DEBRIDEMENT FOOT;  Surgeon: Malvin Marsa FALCON, DPM;  Location: [facility] ORS;  Service: Orthopedics/Podiatry;  Laterality: Left;  WITH BONE BIOPSY   IRRIGATION AND DEBRIDEMENT FOOT Left 05/17/2024   Procedure: IRRIGATION AND DEBRIDEMENT FOOT;  Surgeon: Malvin Marsa FALCON, DPM;  Location: [facility] ORS;  Service: Orthopedics/Podiatry;  Laterality: Left;    Social History   Socioeconomic History   Marital status: Married    Spouse name: Not on file   Number of children: Not on file   Years of education: Not on file   Highest education level: Not on file  Occupational History   Not on file  Tobacco Use   Smoking status: Never   Smokeless tobacco: Never  Substance and Sexual Activity   Alcohol use: Yes   Drug use: Not Currently   Sexual activity: Yes  Other Topics Concern   Not on file  Social History Narrative   Not on file   Social Drivers of Health   Financial Resource Strain: Low Risk  (07/07/2023)   Received from [facility]   Overall Financial Resource Strain (CARDIA)    Difficulty of Paying Living Expenses: Not very hard  Food Insecurity: No Food Insecurity (05/15/2024)   Hunger Vital Sign    Worried  About Running Out of Food in the Last Year: Never true    Ran Out of Food in the Last Year: Never true  Transportation Needs: No Transportation Needs (05/15/2024)   PRAPARE - Administrator, Civil Service (Medical): No    Lack of Transportation (Non-Medical): No  Physical Activity: Not on file  Stress: Not on file  Social Connections: Not on file  Intimate Partner Violence: Not At Risk (05/15/2024)   Humiliation, Afraid, Rape, and Kick questionnaire    Fear of Current or Ex-Partner: No    Emotionally Abused: No    Physically Abused: No    Sexually Abused: No    Family History  Problem Relation Age of Onset   Stroke Mother     Hypertension Mother    Hypertension Father    No Known Allergies I? Current Facility-Administered Medications  Medication Dose Route Frequency Provider Last Rate Last Admin   0.9 %  sodium chloride  infusion   Intravenous Continuous Druscilla Bald, NP 75 mL/hr at 05/18/24 0955 New Bag at 05/18/24 0955   acetaminophen  (TYLENOL ) tablet 650 mg  650 mg Oral Q6H PRN Agbata, Tochukwu, MD   650 mg at 05/17/24 0257   Or   acetaminophen  (TYLENOL ) suppository 650 mg  650 mg Rectal Q6H PRN Agbata, Tochukwu, MD       alum & mag hydroxide-simeth (MAALOX/MYLANTA) 200-200-20 MG/5ML suspension 30 mL  30 mL Oral Q4H PRN Mansy, Jan A, MD   30 mL at 05/17/24 1317   amLODipine  (NORVASC ) tablet 10 mg  10 mg Oral Daily Agbata, Tochukwu, MD   10 mg at 05/18/24 9041   Ampicillin -Sulbactam (UNASYN ) 3 g in sodium chloride  0.9 % 100 mL IVPB  3 g Intravenous Q6H Chappell, Alex B, RPH 200 mL/hr at 05/18/24 0957 3 g at 05/18/24 0957   ascorbic acid  (VITAMIN C ) tablet 500 mg  500 mg Oral BID Agbata, Tochukwu, MD   500 mg at 05/18/24 0957   carvedilol  (COREG ) tablet 25 mg  25 mg Oral BID WC Agbata, Tochukwu, MD   25 mg at 05/18/24 9041   Chlorhexidine  Gluconate Cloth 2 % PADS 6 each  6 each Topical Daily Agbata, Tochukwu, MD   6 each at 05/18/24 0958   dextrose  50 % solution 0-50 mL  0-50 mL Intravenous PRN Agbata, Tochukwu, MD       feeding supplement (NEPRO CARB STEADY) liquid 237 mL  237 mL Oral BID BM Agbata, Tochukwu, MD   237 mL at 05/18/24 1004   heparin  injection 5,000 Units  5,000 Units Subcutaneous Q8H Agbata, Tochukwu, MD   5,000 Units at 05/18/24 0527   HYDROmorphone  (DILAUDID ) injection 1 mg  1 mg Intravenous Q4H PRN Agbata, Tochukwu, MD   1 mg at 05/18/24 0442   insulin  aspart (novoLOG ) injection 0-15 Units  0-15 Units Subcutaneous TID WC Agbata, Tochukwu, MD   8 Units at 05/18/24 1227   insulin  glargine-yfgn (SEMGLEE ) injection 30 Units  30 Units Subcutaneous Daily Agbata, Tochukwu, MD   30 Units at  05/18/24 9040   linezolid  (ZYVOX ) IVPB 600 mg  600 mg Intravenous Q12H Agbata, Tochukwu, MD 300 mL/hr at 05/18/24 1053 600 mg at 05/18/24 1053   multivitamin with minerals tablet 1 tablet  1 tablet Oral Daily Agbata, Tochukwu, MD   1 tablet at 05/18/24 0957   ondansetron  (ZOFRAN ) tablet 4 mg  4 mg Oral Q6H PRN Agbata, Tochukwu, MD       Or   ondansetron  (ZOFRAN ) injection 4  mg  4 mg Intravenous Q6H PRN Agbata, Tochukwu, MD   4 mg at 05/15/24 1719   Oral care mouth rinse  15 mL Mouth Rinse PRN Mansy, Jan A, MD       oxyCODONE  (Oxy IR/ROXICODONE ) immediate release tablet 5 mg  5 mg Oral Q6H PRN Agbata, Tochukwu, MD   5 mg at 05/18/24 1227   pantoprazole  (PROTONIX ) EC tablet 40 mg  40 mg Oral Daily Chappell, Alex B, RPH   40 mg at 05/18/24 9041   phenol (CHLORASEPTIC) mouth spray 1 spray  1 spray Mouth/Throat PRN Mansy, Jan A, MD   1 spray at 05/16/24 0415   pneumococcal 20-valent conjugate vaccine (PREVNAR 20) injection 0.5 mL  0.5 mL Intramuscular Tomorrow-1000 Mansy, Jan A, MD       Tdap (BOOSTRIX ) injection 0.5 mL  0.5 mL Intramuscular Once Nicholaus Rolland BRAVO, MD       thiamine  (VITAMIN B1) tablet 100 mg  100 mg Oral Daily Agbata, Tochukwu, MD   100 mg at 05/18/24 9041   zinc  sulfate (50mg  elemental zinc ) capsule 220 mg  220 mg Oral Daily Agbata, Tochukwu, MD   220 mg at 05/18/24 9041     Abtx:  Anti-infectives (From admission, onward)    Start     Dose/Rate Route Frequency Ordered Stop   05/16/24 1600  Ampicillin -Sulbactam (UNASYN ) 3 g in sodium chloride  0.9 % 100 mL IVPB        3 g 200 mL/hr over 30 Minutes Intravenous Every 6 hours 05/16/24 0850     05/15/24 2200  linezolid  (ZYVOX ) IVPB 600 mg       Placed in And Linked Group   600 mg 300 mL/hr over 60 Minutes Intravenous Every 12 hours 05/15/24 1542 05/22/24 2159   05/15/24 1733  vancomycin  (VANCOCIN ) powder  Status:  Discontinued          As needed 05/15/24 1733 05/15/24 1758   05/15/24 1727  tobramycin  (NEBCIN ) injection  Status:   Discontinued          As needed 05/15/24 1727 05/15/24 1758   05/15/24 1630  Ampicillin -Sulbactam (UNASYN ) 3 g in sodium chloride  0.9 % 100 mL IVPB  Status:  Discontinued        3 g 200 mL/hr over 30 Minutes Intravenous Every 8 hours 05/15/24 1623 05/16/24 0850   05/15/24 1300  piperacillin -tazobactam (ZOSYN ) IVPB 4.5 g        4.5 g 200 mL/hr over 30 Minutes Intravenous  Once 05/15/24 1246 05/15/24 1921   05/15/24 1245  vancomycin  (VANCOCIN ) injection 1,770 mg  Status:  Discontinued        15 mg/kg  118 kg Intravenous  Once 05/15/24 1230 05/15/24 1231   05/15/24 1245  vancomycin  (VANCOREADY) IVPB 1750 mg/350 mL        1,750 mg 175 mL/hr over 120 Minutes Intravenous  Once 05/15/24 1231 05/15/24 1544       REVIEW OF SYSTEMS:  Const: negative fever, negative chills, negative weight loss Eyes: negative diplopia or visual changes, negative eye pain ENT: negative coryza, negative sore throat Resp: negative cough, hemoptysis, dyspnea Cards: negative for chest pain, palpitations, lower extremity edema GU: negative for frequency, dysuria and hematuria GI: as above Skin: negative for rash and pruritus Heme: negative for easy bruising and gum/nose bleeding MS: left foot pain Neurolo:negative for headaches, dizziness, vertigo, memory problems  Psych: negative for feelings of anxiety, depression  Endocrine:  diabetes Allergy/Immunology- negative for any medication or food allergies  Objective:  VITALS:  BP 137/86 (BP Location: Left Arm)   Pulse 82   Temp 98.2 F (36.8 C)   Resp 18   Ht 5' 11 (1.803 m)   Wt 114.1 kg   SpO2 98%   BMI 35.08 kg/m   PHYSICAL EXAM:  General: Alert, cooperative, no distress, appears stated age.  Head: Normocephalic, without obvious abnormality, atraumatic. Eyes: Conjunctivae clear, anicteric sclerae. Pupils are equal ENT Nares normal. No drainage or sinus tenderness. Lips, mucosa, and tongue normal. No Thrush Neck: Supple, symmetrical, no  adenopathy, thyroid: non tender no carotid bruit and no JVD. Back: No CVA tenderness. Lungs: Clear to auscultation bilaterally. No Wheezing or Rhonchi. No rales. Heart: Regular rate and rhythm, no murmur, rub or gallop. Abdomen: Soft, non-tender,not distended. Bowel sounds normal. No masses Extremities: left foot surgical dressing not removed Picture reviewed- lateral wound has beads post debridement 7/30   7/28   Skin: No rashes or lesions. Or bruising Lymph: Cervical, supraclavicular normal. Neurologic: Grossly non-focal Pertinent Labs Lab Results CBC    Component Value Date/Time   WBC 18.0 (H) 05/18/2024 1008   RBC 2.81 (L) 05/18/2024 1008   HGB 8.3 (L) 05/18/2024 1008   HCT 26.1 (L) 05/18/2024 1008   PLT 436 (H) 05/18/2024 1008   MCV 92.9 05/18/2024 1008   MCH 29.5 05/18/2024 1008   MCHC 31.8 05/18/2024 1008   RDW 12.1 05/18/2024 1008   LYMPHSABS 1.5 09/27/2023 2024   MONOABS 0.7 09/27/2023 2024   EOSABS 0.7 (H) 09/27/2023 2024   BASOSABS 0.1 09/27/2023 2024       Latest Ref Rng & Units 05/18/2024   10:08 AM 05/17/2024    3:28 AM 05/16/2024   11:10 AM  CMP  Glucose 70 - 99 mg/dL 787  764  739   BUN 6 - 20 mg/dL 31  37  32   Creatinine 0.61 - 1.24 mg/dL 7.32  6.95  7.00   Sodium 135 - 145 mmol/L 131  129  131   Potassium 3.5 - 5.1 mmol/L 3.9  3.7  4.4   Chloride 98 - 111 mmol/L 101  98  97   CO2 22 - 32 mmol/L 22  23  22    Calcium  8.9 - 10.3 mg/dL 8.0  7.8  8.5       Microbiology: Recent Results (from the past 240 hours)  Blood culture (routine x 2)     Status: None (Preliminary result)   Collection Time: 05/15/24 12:58 PM   Specimen: BLOOD  Result Value Ref Range Status   Specimen Description BLOOD BLOOD RIGHT HAND  Final   Special Requests   Final    BOTTLES DRAWN AEROBIC AND ANAEROBIC Blood Culture results may not be optimal due to an inadequate volume of blood received in culture bottles   Culture   Final    NO GROWTH 3 DAYS Performed at [facility], [address] [place], [place] [address]    Report Status PENDING  Incomplete  Blood culture (routine x 2)     Status: None (Preliminary result)   Collection Time: 05/15/24 12:58 PM   Specimen: BLOOD  Result Value Ref Range Status   Specimen Description BLOOD LEFT ANTECUBITAL  Final   Special Requests   Final    BOTTLES DRAWN AEROBIC AND ANAEROBIC Blood Culture results may not be optimal due to an inadequate volume of blood received in culture bottles   Culture   Final    NO GROWTH 3 DAYS  Performed at [facility], [address] [place], [place] [address]    Report Status PENDING  Incomplete  Aerobic/Anaerobic Culture w Gram Stain (surgical/deep wound)     Status: None (Preliminary result)   Collection Time: 05/15/24  5:23 PM   Specimen: Wound; Tissue  Result Value Ref Range Status   Specimen Description   Final    WOUND Performed at [facility] [address], [place], [place] [address]    Special Requests   Final     LEFT FOOT WOUND TISSUE Performed at [facility], [address] [place], [place] [address]    Gram Stain   Final    FEW WBC PRESENT, PREDOMINANTLY PMN MODERATE GRAM POSITIVE COCCI MODERATE GRAM NEGATIVE RODS    Culture   Final    ABUNDANT STAPHYLOCOCCUS AUREUS ABUNDANT STREPTOCOCCUS MITIS/ORALIS CONFIRMATION OF SUSCEPTIBILITIES IN PROGRESS Performed at [facility], [address], [place], [place] [address]    Report Status PENDING  Incomplete   Organism ID, Bacteria STAPHYLOCOCCUS AUREUS  Final      Susceptibility   Staphylococcus aureus - MIC*    CIPROFLOXACIN <=0.5 SENSITIVE Sensitive     ERYTHROMYCIN <=0.25 SENSITIVE Sensitive     GENTAMICIN  <=0.5 SENSITIVE Sensitive     OXACILLIN 0.5 SENSITIVE Sensitive     TETRACYCLINE <=1 SENSITIVE Sensitive     VANCOMYCIN  <=0.5 SENSITIVE Sensitive     TRIMETH/SULFA <=10 SENSITIVE Sensitive     CLINDAMYCIN <=0.25 SENSITIVE Sensitive     RIFAMPIN <=0.5  SENSITIVE Sensitive     Inducible Clindamycin NEGATIVE Sensitive     LINEZOLID  2 SENSITIVE Sensitive     * ABUNDANT STAPHYLOCOCCUS AUREUS  Aerobic/Anaerobic Culture w Gram Stain (surgical/deep wound)     Status: None (Preliminary result)   Collection Time: 05/15/24  5:30 PM   Specimen: Wound; Tissue  Result Value Ref Range Status   Specimen Description   Final    WOUND Performed at [facility], [address] [place], [place] [address]    Special Requests   Final     LEFT FOOT CUBOID BONE Performed at [facility], [address] [place], [place] [address]    Gram Stain   Final    NO WBC SEEN RARE GRAM POSITIVE COCCI RARE GRAM NEGATIVE RODS    Culture   Final    CULTURE REINCUBATED FOR BETTER GROWTH Performed at [facility], [address] [place], [place] [address]    Report Status PENDING  Incomplete  MRSA Next Gen by PCR, Nasal     Status: None   Collection Time: 05/15/24  6:51 PM   Specimen: Nasal Mucosa; Nasal Swab  Result Value Ref Range Status   MRSA by PCR Next Gen NOT DETECTED NOT DETECTED Final    Comment: (NOTE) The GeneXpert MRSA Assay (FDA approved for NASAL specimens only), is one component of a comprehensive MRSA colonization surveillance program. It is not intended to diagnose MRSA infection nor to guide or monitor treatment for MRSA infections. Test performance is not FDA approved in patients less than 48 years old. Performed at [facility], [address] [place], [place] [address]     Lines and Device Date on insertion # of days DC  Central line     Foley     ETT       IMAGING RESULTS:   Xray of left foot- degenerative changes in the foot- no obvious osteo  I have personally reviewed  the films ? Impression/Recommendations 53 y.o. male with a history of history of hypertension, diabetes mellitus , CKD, obesity presented to the ED for abdominal pain , nausea and vomiting on 05/15/24. Found to be in  DKA  ? DKA on presentation- resolved- managed by primary team A1C is 11.1  Left foot infected ulcer.  ? Charcot foot-Diabetic foot infection s/p debridement MSSA and strep mitis in cultture Bone pathology acute osteomyelitis Pt is currently on linezolid  and unasyn - DC linezolid  Unasyn  can be changed to Cefazolin  Pt has no insurance and does not want to go on IV antibiotics He prefers oral antibioitc Cefadroxil 1 gram PO BID  ( adjusted to ct cl of 41) VS High dose Augmentin  CKD SED rate /crp high  Anemia  Leucocytosis  Discussed the management with the patient in detail  ?

## 2024-05-19 ENCOUNTER — Other Ambulatory Visit (HOSPITAL_COMMUNITY): Payer: Self-pay

## 2024-05-19 ENCOUNTER — Other Ambulatory Visit: Payer: Self-pay

## 2024-05-19 DIAGNOSIS — M869 Osteomyelitis, unspecified: Secondary | ICD-10-CM

## 2024-05-19 LAB — GASTROINTESTINAL PANEL BY PCR, STOOL (REPLACES STOOL CULTURE)

## 2024-05-19 LAB — CBC WITH DIFFERENTIAL/PLATELET
Abs Immature Granulocytes: 0.3 K/uL — ABNORMAL HIGH (ref 0.00–0.07)
Basophils Absolute: 0.1 K/uL (ref 0.0–0.1)
Basophils Relative: 0 %
Eosinophils Absolute: 0.1 K/uL (ref 0.0–0.5)
Eosinophils Relative: 1 %
HCT: 23.1 % — ABNORMAL LOW (ref 39.0–52.0)
Hemoglobin: 7.2 g/dL — ABNORMAL LOW (ref 13.0–17.0)
Immature Granulocytes: 2 %
Lymphocytes Relative: 8 %
Lymphs Abs: 1.6 K/uL (ref 0.7–4.0)
MCH: 29.3 pg (ref 26.0–34.0)
MCHC: 31.2 g/dL (ref 30.0–36.0)
MCV: 93.9 fL (ref 80.0–100.0)
Monocytes Absolute: 1.2 K/uL — ABNORMAL HIGH (ref 0.1–1.0)
Monocytes Relative: 6 %
Neutro Abs: 15.8 K/uL — ABNORMAL HIGH (ref 1.7–7.7)
Neutrophils Relative %: 83 %
Platelets: 432 K/uL — ABNORMAL HIGH (ref 150–400)
RBC: 2.46 MIL/uL — ABNORMAL LOW (ref 4.22–5.81)
RDW: 12.3 % (ref 11.5–15.5)
WBC: 19.1 K/uL — ABNORMAL HIGH (ref 4.0–10.5)
nRBC: 0 % (ref 0.0–0.2)

## 2024-05-19 LAB — GLUCOSE, CAPILLARY
Glucose-Capillary: 228 mg/dL — ABNORMAL HIGH (ref 70–99)
Glucose-Capillary: 245 mg/dL — ABNORMAL HIGH (ref 70–99)
Glucose-Capillary: 269 mg/dL — ABNORMAL HIGH (ref 70–99)
Glucose-Capillary: 166 mg/dL — ABNORMAL HIGH (ref 70–99)

## 2024-05-19 LAB — BASIC METABOLIC PANEL WITH GFR
Anion gap: 9 (ref 5–15)
BUN: 30 mg/dL — ABNORMAL HIGH (ref 6–20)
CO2: 23 mmol/L (ref 22–32)
Calcium: 7.9 mg/dL — ABNORMAL LOW (ref 8.9–10.3)
Chloride: 100 mmol/L (ref 98–111)
Creatinine, Ser: 2.31 mg/dL — ABNORMAL HIGH (ref 0.61–1.24)
GFR, Estimated: 33 mL/min — ABNORMAL LOW (ref >60)
Glucose, Bld: 236 mg/dL — ABNORMAL HIGH (ref 70–99)
Potassium: 3.9 mmol/L (ref 3.5–5.1)
Sodium: 132 mmol/L — ABNORMAL LOW (ref 135–145)

## 2024-05-19 MED ORDER — METRONIDAZOLE 500 MG PO TABS
500.0000 mg | ORAL_TABLET | Freq: Two times a day (BID) | ORAL | 0 refills | Status: AC
  Filled 2024-05-19: qty 42, 21d supply, fill #0

## 2024-05-19 MED ORDER — ROSUVASTATIN CALCIUM 20 MG PO TABS
20.0000 mg | ORAL_TABLET | Freq: Every day | ORAL | 0 refills | Status: DC
  Filled 2024-05-19: qty 30, 30d supply, fill #0

## 2024-05-19 MED ORDER — INSULIN LISPRO PROT & LISPRO (75-25 MIX) 100 UNIT/ML KWIKPEN
20.0000 [IU] | PEN_INJECTOR | Freq: Two times a day (BID) | SUBCUTANEOUS | 0 refills | Status: DC
  Filled 2024-05-19: qty 15, 38d supply, fill #0
  Filled 2024-05-19: qty 36, 90d supply, fill #0

## 2024-05-19 MED ORDER — DEXTROSE 5 % IV SOLN
1500.0000 mg | Freq: Once | INTRAVENOUS | Status: AC
  Administered 2024-05-19: 1500 mg via INTRAVENOUS
  Filled 2024-05-19: qty 75

## 2024-05-19 MED ORDER — METRONIDAZOLE 500 MG PO TABS
500.0000 mg | ORAL_TABLET | Freq: Two times a day (BID) | ORAL | Status: DC
  Administered 2024-05-19 – 2024-05-21 (×5): 500 mg via ORAL
  Filled 2024-05-19 (×5): qty 1

## 2024-05-19 MED ORDER — INSUPEN PEN NEEDLES 31G X 5 MM MISC
0 refills | Status: DC
  Filled 2024-05-19: qty 100, 50d supply, fill #0

## 2024-05-19 MED ORDER — CARVEDILOL 25 MG PO TABS
25.0000 mg | ORAL_TABLET | Freq: Two times a day (BID) | ORAL | 0 refills | Status: DC
  Filled 2024-05-19: qty 180, 90d supply, fill #0

## 2024-05-19 MED ORDER — PANTOPRAZOLE SODIUM 40 MG PO TBEC
40.0000 mg | DELAYED_RELEASE_TABLET | Freq: Every day | ORAL | 0 refills | Status: DC
  Filled 2024-05-19: qty 30, 30d supply, fill #0

## 2024-05-19 MED ORDER — ZINC SULFATE 220 (50 ZN) MG PO TABS
220.0000 mg | ORAL_TABLET | Freq: Every day | ORAL | 0 refills | Status: AC
  Filled 2024-05-19: qty 30, 30d supply, fill #0

## 2024-05-19 MED ORDER — INSULIN GLARGINE-YFGN 100 UNIT/ML ~~LOC~~ SOLN
35.0000 [IU] | Freq: Every day | SUBCUTANEOUS | Status: DC
  Administered 2024-05-20 – 2024-05-21 (×2): 35 [IU] via SUBCUTANEOUS
  Filled 2024-05-19 (×2): qty 0.35

## 2024-05-19 MED ORDER — LOSARTAN POTASSIUM 100 MG PO TABS
100.0000 mg | ORAL_TABLET | Freq: Every day | ORAL | 0 refills | Status: DC
  Filled 2024-05-19: qty 90, 90d supply, fill #0

## 2024-05-19 NOTE — Progress Notes (Signed)
 Progress Note   Patient: Clayton [name] FMW:[id number] DOB: 02/22/1971 DOA: 05/15/2024     4 DOS: the patient was seen and examined on 05/19/2024   Brief hospital course:  Clayton [name] is a 53 y.o. 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  Patient states that he has had symptoms for about a week and has not been able to tolerate any oral intake.  He has not taken his insulin  in 1 week due to the fact that his oral intake has been poor.  He complains about pain mostly in the periumbilical area.  He rates his pain a 7 x 10 in intensity at its worst.  Pain is nonradiating.  He denies having any diarrhea.  Emesis is mostly fluid and bile.  Has not contained any blood or coffee-ground. He states that about a week and half ago he had stepped on an object and developed a blister involving his left foot.  He notes that the blister popped and he now has a large ulceration involving the left lateral portion of his left foot with foul-smelling drainage.  He denies having any fever or chills. He denies having any chest pain, no shortness of breath, no headache, no dizziness, no lightheadedness, no blurred vision, no urinary symptoms, no focal deficit. Abnormal labs include a sodium of 128, glucose 447, BUN 25, creatinine 2.56, total bilirubin 2.2, albumin 2.7, white count 23.2, hemoglobin 10.5 Left foot x-ray shows soft tissue thickening and subcutaneous emphysema along the lateral mid and plantar hindfoot, consistent with known ulcer. No radiographic evidence of osteomyelitis. Patient received 2 L IV fluid bolus in the ER as well as a dose of vancomycin  and Zosyn . He also received Tdap He will be admitted to the hospital for further evaluation.    Assessment and Plan:  * DKA  (diabetic ketoacidosis) (HCC) Resolved Present on admission and secondary to noncompliance to prescribed insulin  Noted to have an anion gap of 18 and hyperglycemia with serum glucose greater than 400 associated with refractory nausea and vomiting on admission Patient was started on insulin  drip per protocol but has been transitioned to long-acting and Premeal insulin  Increase Lantus  35 units daily and continue Premeal insulin        Uncontrolled type 2 diabetes mellitus with hyperglycemia, with long-term current use of insulin  (HCC) Secondary to medication noncompliance Diabetic education Continue long-acting and Premeal insulin      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 No radiographic evidence of osteomyelitis. MRI of the foot showed a large open wound on the plantar and lateral aspect of the midfoot hindfoot junction region. Diffuse cellulitis and myofasciitis without definite findings for pyomyositis. Fluid collection wrapping around the bases of the fourth and fifth metatarsals dorsally could be postoperative fluid collection or dissecting abscess. 14 x 13 mm cystic area in the plantar subcutaneous fat slightly more distally. I do not see any surrounding inflammatory changes to suggest this is an abscess but a small abscess is possible. No definite MR findings to suggest osteomyelitis or septic arthritis. Appreciate podiatry  input.  Patient is status post irrigation and excisional debridement of ulceration to bone level, 6 x 7.5 cm, left foot. Bone biopsy from cuboid via trocar, left foot Application antibiotic drug delivery device, left foot. Application negative  pressure wound therapy 6 x 7.5 cm, left foot 07/30 - Patient is status post irrigation and debridement of ulceration to the level of bone with prep for graft, left foot. Application Maitri Derm dermal allograft, 7 x 6.5 cm, left foot Application negative pressure wound therapy, 7 x 6.5 cm, left foot. Surgical pathology is consistent with acute osteomyelitis Continue empiric antibiotic therapy with Unasyn   Wound cultures yield abundant Staph aureus and Streptococcus mitis/oralis Appreciate ID input, ideally patient requires IV antibiotics for 6 weeks while he prefers oral.  Will need to be discharged home on cefadroxil 1 g p.o. twice daily to complete his treatment Will also need a wound VAC on discharge     Sepsis (HCC) Resolved Present on admission secondary to infected diabetic foot wound As evidenced by fever with a Tmax of 101.3, tachycardia, tachypnea, marked leukocytosis Blood cultures showed no growth in 2 days but wound cultures yielded Staph aureus and Streptococcus mitis/oralis Appreciate podiatry input Appreciate ID input  Continue IV Unasyn      Iron deficiency anemia of chronic disease Noted to have a drop in his H&H from 15g/dl >> 89h/io No obvious source of blood loss at this time       AKI superimposed on CKD stage 4 due to type 2 diabetes mellitus (HCC) Noted to have stage IV chronic kidney disease secondary to uncontrolled diabetes mellitus Noted to have slight worsening of his renal function with a peak in his serum creatinine at 3.04.  Currently shows a downward trend and is almost at baseline  Appreciate nephrology input   Obesity (BMI 30-39.9) Patient with class I obesity, BMI of 33 Complicates overall prognosis and care Lifestyle modification and exercise has been discussed with patient in detail   Essential hypertension Will continue amlodipine  and carvedilol    Hyponatremia with increased serum osmolality Secondary to hyperglycemia related to DKA Expect  improvement with IV fluid hydration Improved with IV fluid hydration     Physical deconditioning Appreciate PT input Patient needs to be nonweightbearing on his left foot Home health upon discharge          Subjective: No new complaints  Physical Exam: Vitals:   05/18/24 2014 05/19/24 0322 05/19/24 0500 05/19/24 0727  BP: 130/75 131/80  (!) 142/74  Pulse: 80 76  79  Resp: 18 18  18   Temp: 98.1 F (36.7 C) 98.9 F (37.2 C)  98.3 F (36.8 C)  TempSrc:      SpO2: 94% 96%  94%  Weight:   119.9 kg   Height:       Comments: Appears comfortable HENT:     Head: Normocephalic and atraumatic.     Mouth/Throat:     Comments: Moist mucous membranes Eyes:     Comments: Pale conjunctiva  Cardiovascular:     Rate and Rhythm: Regular, rate and rhythm Pulmonary:     Effort: Pulmonary effort is normal.     Breath sounds: Normal breath sounds.  Abdominal:     General: Bowel sounds are normal.     Tenderness: Nontender    Comments: Central adiposity, nontender, nondistended  Skin:    General: Skin is warm and dry.  Neurological:     Motor: Weakness present.  Psychiatric:  Mood and Affect: Mood normal.        Behavior: Behavior normal.      Data Reviewed:  There are no new results to review at this time.  Family Communication: Plan of care discussed with patient in detail.  Discharge arrangements being made.  Disposition: Status is: Inpatient Remains inpatient appropriate because: Discharge planning  Planned Discharge Destination: Home with Home Health    Time spent: 35 minutes  Author: Aimee Somerset, MD 05/19/2024 12:13 PM  For on call review [email or web address].

## 2024-05-19 NOTE — Progress Notes (Signed)
 [facility]  ROUNDING NOTE   Subjective:   Patient seen sitting at side of bed Alert and oriented Tolerating small meals  Creatinine 2.67  Objective:  Vital signs in last 24 hours:  Temp:  [98.1 F (36.7 C)-98.9 F (37.2 C)] 98.3 F (36.8 C) (08/01 0727) Pulse Rate:  [76-80] 79 (08/01 0727) Resp:  [18] 18 (08/01 0727) BP: (130-142)/(73-80) 142/74 (08/01 0727) SpO2:  [94 %-98 %] 94 % (08/01 0727) Weight:  [119.9 kg] 119.9 kg (08/01 0500)  Weight change: 6.033 kg Filed Weights   05/17/24 0929 05/18/24 0427 05/19/24 0500  Weight: 113.9 kg 114.1 kg 119.9 kg    Intake/Output: I/O last 3 completed shifts: In: 961.5 [P.O.:480; I.V.:481.5] Out: 900 [Urine:850; Drains:50]   Intake/Output this shift:  Total I/O In: 270 [P.O.:270] Out: 700 [Urine:700]  Physical Exam: General: NAD  Head: Normocephalic, atraumatic. Moist oral mucosal membranes  Eyes: Anicteric  Neck: Supple  Lungs:  Clear to auscultation  Heart: Regular rate and rhythm  Abdomen:  Soft, nontender  Extremities:  No peripheral edema.  Neurologic: Awake, alert, conversant  Skin: Warm,dry, no rash       Basic Metabolic Panel: Recent Labs  Lab 05/16/24 0405 05/16/24 0748 05/16/24 1110 05/17/24 0328 05/18/24 1008  NA 132* 134* 131* 129* 131*  K 4.9 3.9 4.4 3.7 3.9  CL 99 100 97* 98 101  CO2 21* 20* 22 23 22   GLUCOSE 215* 190* 260* 235* 212*  BUN 29* 32* 32* 37* 31*  CREATININE 2.82* 2.99* 2.99* 3.04* 2.67*  CALCIUM  8.3* 8.3* 8.5* 7.8* 8.0*  MG  --   --   --  2.1  --   PHOS  --   --   --  2.8  --     Liver Function Tests: Recent Labs  Lab 05/15/24 1155  AST 29  ALT 22  ALKPHOS 77  BILITOT 2.2*  PROT 7.3  ALBUMIN 2.7*   Recent Labs  Lab 05/15/24 1155  LIPASE 24   No results for input(s): AMMONIA in the last 168 hours.  CBC: Recent Labs  Lab 05/15/24 1155 05/16/24 0405 05/18/24 1008  WBC 23.2* 19.0* 18.0*  HGB 10.5* 9.6* 8.3*  HCT 32.6* 28.8* 26.1*  MCV 90.1  90.6 92.9  PLT 368 326 436*    Cardiac Enzymes: No results for input(s): CKTOTAL, CKMB, CKMBINDEX, TROPONINI in the last 168 hours.  BNP: Invalid input(s): POCBNP  CBG: Recent Labs  Lab 05/18/24 1212 05/18/24 1642 05/18/24 2045 05/19/24 0728 05/19/24 1201  GLUCAP 284* 266* 225* 228* 245*    Microbiology: Results for orders placed or performed during the hospital encounter of 05/15/24  Blood culture (routine x 2)     Status: None (Preliminary result)   Collection Time: 05/15/24 12:58 PM   Specimen: BLOOD  Result Value Ref Range Status   Specimen Description BLOOD BLOOD RIGHT HAND  Final   Special Requests   Final    BOTTLES DRAWN AEROBIC AND ANAEROBIC Blood Culture results may not be optimal due to an inadequate volume of blood received in culture bottles   Culture   Final    NO GROWTH 4 DAYS Performed at [facility], [address] [place], [place] [address]    Report Status PENDING  Incomplete  Blood culture (routine x 2)     Status: None (Preliminary result)   Collection Time: 05/15/24 12:58 PM   Specimen: BLOOD  Result Value Ref Range Status   Specimen Description BLOOD LEFT ANTECUBITAL  Final   Special Requests   Final    BOTTLES DRAWN AEROBIC AND ANAEROBIC Blood Culture results may not be optimal due to an inadequate volume of blood received in culture bottles   Culture   Final    NO GROWTH 4 DAYS Performed at [facility], [address], [place], [place] [address]    Report Status PENDING  Incomplete  Aerobic/Anaerobic Culture w Gram Stain (surgical/deep wound)     Status: None (Preliminary result)   Collection Time: 05/15/24  5:23 PM   Specimen: Wound; Tissue  Result Value Ref Range Status   Specimen Description   Final    WOUND Performed at [facility], [address], [place], [place] [address]    Special Requests   Final     LEFT FOOT WOUND TISSUE Performed at [facility], [address]  [place], [place] [address]    Gram Stain   Final    FEW WBC PRESENT, PREDOMINANTLY PMN MODERATE GRAM POSITIVE COCCI MODERATE GRAM NEGATIVE RODS    Culture   Final    ABUNDANT STAPHYLOCOCCUS AUREUS ABUNDANT STREPTOCOCCUS MITIS/ORALIS HOLDING FOR POSSIBLE ANAEROBE Performed at [facility], [address], [place], [place] [address]    Report Status PENDING  Incomplete   Organism ID, Bacteria STAPHYLOCOCCUS AUREUS  Final   Organism ID, Bacteria STREPTOCOCCUS MITIS/ORALIS  Final      Susceptibility   Staphylococcus aureus - MIC*    CIPROFLOXACIN <=0.5 SENSITIVE Sensitive     ERYTHROMYCIN <=0.25 SENSITIVE Sensitive     GENTAMICIN  <=0.5 SENSITIVE Sensitive     OXACILLIN 0.5 SENSITIVE Sensitive     TETRACYCLINE <=1 SENSITIVE Sensitive     VANCOMYCIN  <=0.5 SENSITIVE Sensitive     TRIMETH/SULFA <=10 SENSITIVE Sensitive     CLINDAMYCIN <=0.25 SENSITIVE Sensitive     RIFAMPIN <=0.5 SENSITIVE Sensitive     Inducible Clindamycin NEGATIVE Sensitive     LINEZOLID  2 SENSITIVE Sensitive     * ABUNDANT STAPHYLOCOCCUS AUREUS   Streptococcus mitis/oralis - MIC*    PENICILLIN <=0.06 SENSITIVE Sensitive     CEFTRIAXONE  <=0.12 SENSITIVE Sensitive     LEVOFLOXACIN 1 SENSITIVE Sensitive     VANCOMYCIN  0.5 SENSITIVE Sensitive     * ABUNDANT STREPTOCOCCUS MITIS/ORALIS  Aerobic/Anaerobic Culture w Gram Stain (surgical/deep wound)     Status: None (Preliminary result)   Collection Time: 05/15/24  5:30 PM   Specimen: Wound; Tissue  Result Value Ref Range Status   Specimen Description   Final    WOUND Performed at [facility], [address] [place], [place] [address]    Special Requests   Final     LEFT FOOT CUBOID BONE Performed at [facility], [address] [place], [place] [address]    Gram Stain   Final    NO WBC SEEN RARE GRAM POSITIVE COCCI RARE GRAM NEGATIVE RODS    Culture   Final    FEW STREPTOCOCCUS MITIS/ORALIS FEW STREPTOCOCCUS SPECIES CONFIRMATION OF  SUSCEPTIBILITIES IN PROGRESS HOLDING FOR POSSIBLE ANAEROBE Performed at [facility], [address] [place], [place] [address]    Report Status PENDING  Incomplete   Organism ID, Bacteria STREPTOCOCCUS MITIS/ORALIS  Final      Susceptibility   Streptococcus mitis/oralis - MIC*    PENICILLIN <=0.06 SENSITIVE Sensitive     CEFTRIAXONE  <=0.12 SENSITIVE Sensitive     LEVOFLOXACIN 1 SENSITIVE Sensitive     VANCOMYCIN  0.5 SENSITIVE Sensitive     *  FEW STREPTOCOCCUS MITIS/ORALIS  MRSA Next Gen by PCR, Nasal     Status: None   Collection Time: 05/15/24  6:51 PM   Specimen: Nasal Mucosa; Nasal Swab  Result Value Ref Range Status   MRSA by PCR Next Gen NOT DETECTED NOT DETECTED Final    Comment: (NOTE) The GeneXpert MRSA Assay (FDA approved for NASAL specimens only), is one component of a comprehensive MRSA colonization surveillance program. It is not intended to diagnose MRSA infection nor to guide or monitor treatment for MRSA infections. Test performance is not FDA approved in patients less than 53 years old. Performed at [facility], [address] [place], [place] [address]     Coagulation Studies: No results for input(s): LABPROT, INR in the last 72 hours.  Urinalysis: No results for input(s): COLORURINE, LABSPEC, PHURINE, GLUCOSEU, HGBUR, BILIRUBINUR, KETONESUR, PROTEINUR, UROBILINOGEN, NITRITE, LEUKOCYTESUR in the last 72 hours.  Invalid input(s): APPERANCEUR    Imaging: US  RENAL Result Date: 05/18/2024 CLINICAL DATA:  Acute kidney failure EXAM: RENAL / URINARY TRACT ULTRASOUND COMPLETE COMPARISON:  CT 04/17/2021 FINDINGS: Right Kidney: Renal measurements: 10.1 x 4.8 x 5 cm = volume: 125 mL. Echogenicity within normal limits. No mass or hydronephrosis visualized. Left Kidney: Renal measurements: 10.6 x 5.4 x 4.8 cm = volume: 144 mL. Echogenicity within normal limits. No mass or hydronephrosis visualized. Bladder: Incompletely  distended.  Bilateral ureteral jets noted. Other: None. IMPRESSION: Negative. Electronically Signed   By: JONETTA Faes M.D.   On: 05/18/2024 11:39     Medications:    sodium chloride  Stopped (05/19/24 1019)   dalbavancin (DALVANCE) 1,500 mg in dextrose  5 % 500 mL IVPB      amLODipine   10 mg Oral Daily   vitamin C   500 mg Oral BID   carvedilol   25 mg Oral BID WC   Chlorhexidine  Gluconate Cloth  6 each Topical Daily   feeding supplement (NEPRO CARB STEADY)  237 mL Oral BID BM   heparin   5,000 Units Subcutaneous Q8H   insulin  aspart  0-15 Units Subcutaneous TID WC   [START ON 05/20/2024] insulin  glargine-yfgn  35 Units Subcutaneous Daily   metroNIDAZOLE   500 mg Oral Q12H   multivitamin with minerals  1 tablet Oral Daily   pantoprazole   40 mg Oral Daily   thiamine   100 mg Oral Daily   zinc  sulfate (50mg  elemental zinc )  220 mg Oral Daily   acetaminophen  **OR** acetaminophen , alum & mag hydroxide-simeth, dextrose , HYDROmorphone  (DILAUDID ) injection, ondansetron  **OR** ondansetron  (ZOFRAN ) IV, mouth rinse, oxyCODONE , phenol  Assessment/ Plan:  Clayton [name] is a 53 y.o.  male with past medical conditions including diabetes, hypertension, chronic kidney disease stage IV, who was admitted to [facility] on 05/15/2024 for DKA (diabetic ketoacidosis) (HCC) [E11.10] Hyperglycemia [R73.9] Non compliance w medication regimen [Z91.148] Sepsis, due to unspecified organism, unspecified whether acute organ dysfunction present (HCC) [A41.9] Ulcer of left foot with fat layer exposed (HCC) [L97.522] Nausea and vomiting, unspecified vomiting type [R11.2]   Acute kidney injury on chronic kidney disease stage IV.  Baseline appears to be 2.58 with GFR 29 on 09/28/2023.  Patient does not follow with outpatient nephrology.  Renal ultrasound negative for obstruction, bilateral ureteral jets noted. Acute kidney injury likely secondary to dehydration. Chronic kidney disease secondary to uncontrolled diabetes and  hypertension.  Creatinine responding well to IVF. Continue for 1 additional day. Patient encouraged to maintain oral intake. Will follow up with patient outpatient.   Lab Results  Component Value Date  CREATININE 2.67 (H) 05/18/2024   CREATININE 3.04 (H) 05/17/2024   CREATININE 2.99 (H) 05/16/2024    Intake/Output Summary (Last 24 hours) at 05/19/2024 1425 Last data filed at 05/19/2024 1300 Gross per 24 hour  Intake 991.48 ml  Output 900 ml  Net 91.48 ml    2. Anemia of chronic kidney disease Lab Results  Component Value Date   HGB 8.3 (L) 05/18/2024   Hgb slightly decreased. Will continue to monitor.  . 3. Secondary Hyperparathyroidism: with outpatient labs: none available Lab Results  Component Value Date   CALCIUM  8.0 (L) 05/18/2024   PHOS 2.8 05/17/2024    PTH pending. Calcium  at goal  4.  Hypertension with chronic kidney disease.  Home regimen includes amlodipine , carvedilol , losartan .  Currently receiving amlodipine  and carvedilol . Blood pressure 142/74   5.  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 Home regimen includes Jardiance, Humalog , and metformin . Most recent hemoglobin A1c is 11.1 on 05/15/24.  Placed on insulin  drip on admission. Glucose elevated    LOS: 4 Divonte Senger 8/1/20252:25 PM

## 2024-05-19 NOTE — Progress Notes (Signed)
 Date of Admission:  05/15/2024      ID: Clayton [name] is a 53 y.o. male Principal Problem:   DKA (diabetic ketoacidosis) (HCC) Active Problems:   Hyponatremia with increased serum osmolality   Uncontrolled type 2 diabetes mellitus with hyperglycemia, with long-term current use of insulin  (HCC)   Sepsis (HCC)   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, with fat layer exposed (HCC)   Essential hypertension   Obesity (BMI 30-39.9)   CKD stage 4 due to type 2 diabetes mellitus (HCC)   Anemia of chronic disease   Ulcer of left foot with necrosis of bone (HCC)    Subjective: Pt doing fine Left leg wound vac  Medications:   amLODipine   10 mg Oral Daily   vitamin C   500 mg Oral BID   carvedilol   25 mg Oral BID WC   Chlorhexidine  Gluconate Cloth  6 each Topical Daily   feeding supplement (NEPRO CARB STEADY)  237 mL Oral BID BM   heparin   5,000 Units Subcutaneous Q8H   insulin  aspart  0-15 Units Subcutaneous TID WC   [START ON 05/20/2024] insulin  glargine-yfgn  35 Units Subcutaneous Daily   multivitamin with minerals  1 tablet Oral Daily   pantoprazole   40 mg Oral Daily   thiamine   100 mg Oral Daily   zinc  sulfate (50mg  elemental zinc )  220 mg Oral Daily    Objective: Vital signs in last 24 hours: Patient Vitals for the past 24 hrs:  BP Temp Pulse Resp SpO2 Weight  05/19/24 0727 (!) 142/74 98.3 F (36.8 C) 79 18 94 % --  05/19/24 0500 -- -- -- -- -- 119.9 kg  05/19/24 0322 131/80 98.9 F (37.2 C) 76 18 96 % --  05/18/24 2014 130/75 98.1 F (36.7 C) 80 18 94 % --  05/18/24 1510 136/73 98.6 F (37 C) 78 18 98 % --     Lines and Device Date on insertion # of days DC  Engineer, technical sales     ETT       PHYSICAL EXAM:  General: Alert, cooperative, no distress, appears stated age.  Lungs: Clear to auscultation bilaterally. No Wheezing or Rhonchi. No rales. Heart: Regular rate and rhythm, no murmur, rub or gallop. Abdomen: Soft,  non-tender,not distended. Bowel sounds normal. No masses Extremities:left leg- wound vac  Skin: No rashes or lesions. Or bruising Lymph: Cervical, supraclavicular normal. Neurologic: Grossly non-focal  Lab Results    Latest Ref Rng & Units 05/18/2024   10:08 AM 05/16/2024    4:05 AM 05/15/2024   11:55 AM  CBC  WBC 4.0 - 10.5 K/uL 18.0  19.0  23.2   Hemoglobin 13.0 - 17.0 g/dL 8.3  9.6  89.4   Hematocrit 39.0 - 52.0 % 26.1  28.8  32.6   Platelets 150 - 400 K/uL 436  326  368        Latest Ref Rng & Units 05/18/2024   10:08 AM 05/17/2024    3:28 AM 05/16/2024   11:10 AM  CMP  Glucose 70 - 99 mg/dL 787  764  739   BUN 6 - 20 mg/dL 31  37  32   Creatinine 0.61 - 1.24 mg/dL 7.32  6.95  7.00   Sodium 135 - 145 mmol/L 131  129  131   Potassium 3.5 - 5.1 mmol/L 3.9  3.7  4.4   Chloride 98 - 111 mmol/L 101  98  97  CO2 22 - 32 mmol/L 22  23  22    Calcium  8.9 - 10.3 mg/dL 8.0  7.8  8.5       Microbiology:  Studies/Results: US  RENAL Result Date: 05/18/2024 CLINICAL DATA:  Acute kidney failure EXAM: RENAL / URINARY TRACT ULTRASOUND COMPLETE COMPARISON:  CT 04/17/2021 FINDINGS: Right Kidney: Renal measurements: 10.1 x 4.8 x 5 cm = volume: 125 mL. Echogenicity within normal limits. No mass or hydronephrosis visualized. Left Kidney: Renal measurements: 10.6 x 5.4 x 4.8 cm = volume: 144 mL. Echogenicity within normal limits. No mass or hydronephrosis visualized. Bladder: Incompletely distended.  Bilateral ureteral jets noted. Other: None. IMPRESSION: Negative. Electronically Signed   By: JONETTA Faes M.D.   On: 05/18/2024 11:39     Assessment/Plan: 53 y.o. male with a history of history of hypertension, diabetes mellitus , CKD, obesity presented to the ED for abdominal pain , nausea and vomiting on 05/15/24. Found to be in DKA   ? DKA on presentation- resolved- managed by primary team A1C is 11.1   Left foot infected ulcer.  ? Charcot foot-Diabetic foot infection- with osteomyelitis s/p  debridement MSSA and strep mitis/ prevotella in in cultture Bone pathology acute osteomyelitis Pt is currently on unasyn -  Discussed the 3 options for managing his infeciton 1) Daily IV antibioitc- he does not want to do choose this option 2) Weekly Dalbavancin X 2 weeks + po flagyl  500mg  BID 3) Cefadroxil 1 gram BID+ falgyl 500mg  PO BID X 4 weeks Or high dose augmentin XR 2 grams BID ( too expensive)  He prefers  Option 2 HE will get 1500mg  Dalbavancin today and 2nd dose on 05/26/24 at day surgery ( short stay) Flagyl  500mg  PO BID x3 Explained side effects of flagyl  and to watch for any tingling numness He has a follow up with me on 8/14  HE still has leucocytosis- repeat AM    CKD  SED rate /crp high   Anemia     Discussed the management with the patient in detail Discussed with hospitalist  ID will not see him this weekend Call if needed

## 2024-05-19 NOTE — Inpatient Diabetes Management (Addendum)
 Inpatient Diabetes Program Recommendations  AACE/ADA: New Consensus Statement on Inpatient Glycemic Control (2015)  Target Ranges:  Prepandial:   less than 140 mg/dL      Peak postprandial:   less than 180 mg/dL (1-2 hours)      Critically ill patients:  140 - 180 mg/dL   Lab Results  Component Value Date   GLUCAP 228 (H) 05/19/2024   HGBA1C 11.1 (H) 05/15/2024     Latest Reference Range & Units 05/18/24 07:38 05/18/24 12:12 05/18/24 16:42 05/18/24 20:45 05/19/24 07:28  Glucose-Capillary 70 - 99 mg/dL 809 (H) 715 (H) 733 (H) 225 (H) 228 (H)   Diabetes history: DM2 Outpatient Diabetes medications: 75/25 15 (has only been taking 14 units once daily) units bid, Jardiance 25 mg daily, Metformin  1 gm bid Current orders for Inpatient glycemic control: Semglee  30 units daily, Novolog  0-15 units tid. Nepro bid between meals  Inpatient Diabetes Program Recommendations:    -   consider increasing Semglee  to 35-40 units daily   Thanks,  Clotilda Bull RN, MSN, BC-ADM Inpatient Diabetes Coordinator Team Pager [phone] (8a-5p)

## 2024-05-19 NOTE — Evaluation (Signed)
 Physical Therapy Evaluation Patient Details Name: Clayton [name] MRN: [id number] DOB: [birth year] Today's Date: 05/19/2024  History of Present Illness  Pt is a 53 y/o M admitted on 05/15/24 after presenting with c/o abdominal pain with intractable N&V as well as L foot ulceration with foul-smelling drainage after stepping on an object. Pt diagnosed with L foot acute osteomyelitis & underwent I&D with wound vac placement on 05/17/24. Pt also treated for DKA. PMH: DM, CKD IV, HTN, obesity  Clinical Impression  Pt seen for PT evaluation with pt agreeable. Pt reports prior to admission he was independent, working at [employer] in [employer] department. Pt reports he plans to stay on main level of home at d/c. PT educates pt on NWB LLE in post op shoe, pt requires min assist for donning shoe. Pt trialed knee scooter, able to ambulate in room, bathroom & hallway with CGA. Pt transfers from EOB & low toilet with grab bar with min assist, doing well at maintaining NWB LLE. Discussed threshold negotiation into home but pt may benefit from practicing this prior to d/c. Will continue to follow pt acutely to progress mobility as able.        If plan is discharge home, recommend the following: A little help with walking and/or transfers;A little help with bathing/dressing/bathroom;Assistance with cooking/housework;Assist for transportation;Help with stairs or ramp for entrance   Can travel by private vehicle        Equipment Recommendations Other (comment);BSC/3in1 (knee scooter)  Recommendations for Other Services       Functional Status Assessment Patient has had a recent decline in their functional status and demonstrates the ability to make significant improvements in function in a reasonable and predictable amount of time.     Precautions / Restrictions Precautions Precautions: Fall Required Braces or Orthoses: Other Brace Other Brace: RLE post op shoe Restrictions Weight Bearing Restrictions Per Provider  Order: Yes LLE Weight Bearing Per Provider Order: Non weight bearing (in post op shoe)      Mobility  Bed Mobility Overal bed mobility: Modified Independent Bed Mobility: Supine to Sit     Supine to sit: Modified independent (Device/Increase time), Used rails          Transfers Overall transfer level: Needs assistance Equipment used:  (knee scooter) Transfers: Sit to/from Stand, Bed to chair/wheelchair/BSC Sit to Stand: Min assist           General transfer comment: sit>stand from EOB, low toilet in bathroom with grab bars, cuing/education re: placement of knee scooter    Ambulation/Gait Ambulation/Gait assistance: Contact guard assist, Supervision Gait Distance (Feet): 150 Feet Assistive device: Knee scooter Gait Pattern/deviations: Step-through pattern Gait velocity: slightly decreased        Stairs Stairs: Yes       General stair comments: Educated pt on stair negotiation; educated pt to place chair at threshold step, walk up to chair & sit then turn feet into house, have someone transfer knee scooter up into house, move BLE into house then stand with knee scooter; pt voiced understanding of this method  Wheelchair Mobility     Tilt Bed    Modified Rankin (Stroke Patients Only)       Balance Overall balance assessment: Needs assistance Sitting-balance support: Feet supported Sitting balance-Leahy Scale: Good     Standing balance support: During functional activity, Bilateral upper extremity supported, Single extremity supported Standing balance-Leahy Scale: Fair  Pertinent Vitals/Pain Pain Assessment Pain Assessment: Faces Faces Pain Scale: Hurts a little bit Pain Location: L foot Pain Descriptors / Indicators: Discomfort, Grimacing Pain Intervention(s): Monitored during session    Home Living Family/patient expects to be discharged to:: Private residence Living Arrangements: Spouse/significant  other;Children Available Help at Discharge: Family;Available PRN/intermittently (wife works during the day, has a 82 y/o son) Type of Home: House Home Access: Stairs to enter   [employer] of Steps: 6 threshold step Alternate Level Stairs-Number of Steps: flight Home Layout: Two level;Bed/bath upstairs Home Equipment: None      Prior Function Prior Level of Function : Independent/Modified Independent;Working/employed;Driving             Mobility Comments: Independent, working at [employer] in [employer], denies falls ADLs Comments: independent     Extremity/Trunk Assessment   Upper Extremity Assessment Upper Extremity Assessment: Overall WFL for tasks assessed    Lower Extremity Assessment Lower Extremity Assessment: LLE deficits/detail LLE Deficits / Details: LLE in ace wrap, wound vac, pt reports hx of LLE foot drop 2/2 old injury    Cervical / Trunk Assessment Cervical / Trunk Assessment: Normal  Communication   Communication Communication: No apparent difficulties    Cognition Arousal: Alert Behavior During Therapy: WFL for tasks assessed/performed   PT - Cognitive impairments: No apparent impairments                         Following commands: Intact       Cueing Cueing Techniques: Verbal cues     General Comments General comments (skin integrity, edema, etc.): Educated pt & provided min assist with donning LLE post op shoe. Pt reports he plans to stay downstairs & sleep in recliner & sink bathe at this time.    Exercises Other Exercises Other Exercises: Pt toileted with continent BM, performs peri hygiene without assistance.   Assessment/Plan    PT Assessment Patient needs continued PT services  PT Problem List Decreased strength;Decreased activity tolerance;Decreased balance;Decreased mobility;Pain;Decreased safety awareness;Decreased knowledge of precautions;Decreased knowledge of use of DME       PT Treatment Interventions DME  instruction;Balance training;Gait training;Neuromuscular re-education;Stair training;Functional mobility training;Therapeutic activities;Therapeutic exercise;Patient/family education    PT Goals (Current goals can be found in the Care Plan section)  Acute Rehab PT Goals Patient Stated Goal: get better, go home PT Goal Formulation: With patient Time For Goal Achievement: 06/02/24 Potential to Achieve Goals: Good    Frequency 7X/week     Co-evaluation               AM-PAC PT 6 Clicks Mobility  Outcome Measure Help needed turning from your back to your side while in a flat bed without using bedrails?: None Help needed moving from lying on your back to sitting on the side of a flat bed without using bedrails?: None Help needed moving to and from a bed to a chair (including a wheelchair)?: A Little Help needed standing up from a chair using your arms (e.g., wheelchair or bedside chair)?: A Little Help needed to walk in hospital room?: A Little Help needed climbing 3-5 steps with a railing? : A Little 6 Click Score: 20    End of Session Equipment Utilized During Treatment:  (LLE post op shoe) Activity Tolerance: Patient tolerated treatment well Patient left: in chair;with call bell/phone within reach Nurse Communication:  (wound vac alarming) PT Visit Diagnosis: Unsteadiness on feet (R26.81);Other abnormalities of gait and mobility (R26.89);Muscle weakness (generalized) (M62.81);Difficulty in  walking, not elsewhere classified (R26.2)    Time: 8942-8866 PT Time Calculation (min) (ACUTE ONLY): 36 min   Charges:   PT Evaluation $PT Eval Low Complexity: 1 Low PT Treatments $Gait Training: 8-22 mins PT General Charges $$ ACUTE PT VISIT: 1 Visit         Richerd Pinal, PT, DPT 05/19/24, 11:54 AM   Richerd CHRISTELLA Pinal 05/19/2024, 11:52 AM

## 2024-05-19 NOTE — Plan of Care (Signed)
  Problem: Education: Goal: Ability to describe self-care measures that may prevent or decrease complications (Diabetes Survival Skills Education) will improve Outcome: Progressing   Problem: Coping: Goal: Ability to adjust to condition or change in health will improve Outcome: Progressing

## 2024-05-19 NOTE — TOC Progression Note (Addendum)
 Transition of Care [facility]) - Progression Note    Patient Details  Name: Clayton [name] MRN: [id number] Date of Birth: 1971-08-15  [facility]) CM/SW Contact  Marinda Cooks, RN Phone Number: 05/19/2024, 2:34 PM  Clinical Narrative:    This CM received update that pt's LOG agreement for [facility] with [facility] for SN approved via email by Ellouise Gales and CM Engineer, production. This CM received update from [employer] rep Abigail @[phone] informing pt was denied [place] option due to income being to high and would potentially require a out of pocket up front fee for 2 wks of $1757.00 then a fee of $125.50 daily. Abigail informed she would attempt to secure wound vac payment  via pt's Medicaid that is pending status and update . This CM called and spoke with pt's wife and provided updates she verbalized understanding. TOC will cont to follow dc planning / care coordination and update as applicable.   16:15 This CM received call back from pt [employer] liaison Abigail and updated that wound vac can now be submitted through medicaid due to it's  pending status with Medicaid to see if it will be covered. However the new form needs to be completed by the provider that  completed the 1st form.   This CM informed  the covering MD today of this above info and was informed the podiatry provider  Thresa Sar, DPN signed the initial 1st  form for the wound vac to be sent for payment . This CM alerted provider and  medical team of additional medicaid wound vac form needing to be completed. Email sent with new Medicaid form( [facility] Request for Prior Approval) attached with provider cc.  This CM also informed by Lav[facility] OTA liaison If this provider Thresa  is not available to complete & sign Medicaid form in addition to the 1st form he completed and a  new provider  completes they will need to complete both forms and sign . After this is done then the forms can be sent off to be processed for payment . TOC will cont to  follow dc planning / care coordination and update as applicable.   Expected Discharge Plan: Home w Home Health Services Barriers to Discharge: Continued Medical Work up               Expected Discharge Plan and Services     Post Acute Care Choice: Home Health, Durable Medical Equipment Living arrangements for the past 2 months: Single Family Home                                       Social Drivers of Health (SDOH) Interventions SDOH Screenings   Food Insecurity: No Food Insecurity (05/15/2024)  Housing: Low Risk  (05/15/2024)  Transportation Needs: No Transportation Needs (05/15/2024)  Utilities: Not At Risk (05/15/2024)  Financial Resource Strain: Low Risk  (07/07/2023)   Received from [facility]  Tobacco Use: Low Risk  (05/17/2024)    Readmission Risk Interventions     No data to display

## 2024-05-20 LAB — AEROBIC/ANAEROBIC CULTURE W GRAM STAIN (SURGICAL/DEEP WOUND): Gram Stain: NONE SEEN

## 2024-05-20 LAB — GLUCOSE, CAPILLARY
Glucose-Capillary: 152 mg/dL — ABNORMAL HIGH (ref 70–99)
Glucose-Capillary: 235 mg/dL — ABNORMAL HIGH (ref 70–99)
Glucose-Capillary: 226 mg/dL — ABNORMAL HIGH (ref 70–99)
Glucose-Capillary: 172 mg/dL — ABNORMAL HIGH (ref 70–99)
Glucose-Capillary: 170 mg/dL — ABNORMAL HIGH (ref 70–99)

## 2024-05-20 LAB — CULTURE, BLOOD (ROUTINE X 2)
Culture: NO GROWTH
Culture: NO GROWTH

## 2024-05-20 NOTE — Plan of Care (Signed)

## 2024-05-20 NOTE — Progress Notes (Addendum)
 Physical Therapy Treatment Patient Details Name: Clayton [name] MRN: [id number] DOB: August 02, 1971 Today's Date: 05/20/2024   History of Present Illness Pt is a 53 y/o M admitted on 05/15/24 after presenting with c/o abdominal pain with intractable N&V as well as L foot ulceration with foul-smelling drainage after stepping on an object. Pt diagnosed with L foot acute osteomyelitis & underwent I&D with wound vac placement on 05/17/24. Pt also treated for DKA. PMH: DM, CKD IV, HTN, obesity    PT Comments  Pt was supine in bed with RN at bedside given pain meds. He remains A and O x 4. Cooperative and pleasant. Reviewed importance of NWB for proper healing. Pt states understanding and was agreeable to OOB activity. Pt demonstrated safe abilities to exit bed, stand to RW (while maintaining NWB) and hopping to knee scooter. He demonstrated safe abilities to transition from standing in RW to knee scooter without LOB or physical assistance. Pt self propelled knee scooter 400 ft. Author brought single step to room to simulate performance of ascending/descending threshold like home environment however pt was unwilling to attempt due to fear/fatigue.  I'll try it later, IM too tired right now. Overall pt tolerated session well and is progressing well from a PT standpoint. DC recs remain appropriate.    If plan is discharge home, recommend the following: A little help with walking and/or transfers;A little help with bathing/dressing/bathroom;Assistance with cooking/housework;Assist for transportation;Help with stairs or ramp for entrance     Equipment Recommendations  Other (comment) (knee scooter) RW, BSC (For elevated toilet height for easy of transfers while maintaining NWB)       Precautions / Restrictions Precautions Precautions: Fall Recall of Precautions/Restrictions: Intact Required Braces or Orthoses: Other Brace (post op shoe) Other Brace: RLE post op shoe Restrictions Weight Bearing Restrictions  Per Provider Order: Yes LLE Weight Bearing Per Provider Order: Non weight bearing     Mobility  Bed Mobility Overal bed mobility: Modified Independent Bed Mobility: Supine to Sit  Supine to sit: Modified independent (Device/Increase time), Used rails   Transfers Overall transfer level: Needs assistance Equipment used: Rolling walker (2 wheels) Transfers: Sit to/from Stand, Bed to chair/wheelchair/BSC Sit to Stand: Contact guard assist  General transfer comment: CGA for safety with vcs for technique improvements. upon standing, pt demonstrated safe abilties to transition form RW to knee scooter    Ambulation/Gait Ambulation/Gait assistance: Supervision Gait Distance (Feet): 400 Feet Assistive device: Knee scooter Gait velocity: several rest breaks  General Gait Details: no LOB or difficulty with ambulation with knee scooter   Stairs Stairs: Yes  General stair comments: Author brought single curb step to room to have pt simulate home enrty. Author demonstarted and then pt was going to attempt however once time came to actuually attempt hopping up single step. pt endorses fatigue and requested to attempt at a later time.    Balance Overall balance assessment: Needs assistance Sitting-balance support: Feet supported Sitting balance-Leahy Scale: Good     Standing balance support: During functional activity, Bilateral upper extremity supported, Single extremity supported Standing balance-Leahy Scale: Good    Communication Communication Communication: No apparent difficulties  Cognition Arousal: Alert Behavior During Therapy: WFL for tasks assessed/performed   PT - Cognitive impairments: No apparent impairments       PT - Cognition Comments: Pt is A and O x 4 Following commands: Intact      Cueing Cueing Techniques: Verbal cues, Tactile cues     General Comments General comments (skin integrity, edema,  etc.): re-educated pt on importance of NWB for healing. Discussed  post acute equipment needs and expectations at DC. Pt states he understands and continues to plan to return home with [facility] at DC.      Pertinent Vitals/Pain Pain Assessment Pain Assessment: 0-10 Pain Score: 6  Pain Location: L foot Pain Descriptors / Indicators: Discomfort, Grimacing Pain Intervention(s): Limited activity within patient's tolerance, Monitored during session, Premedicated before session, Repositioned     PT Goals (current goals can now be found in the care plan section) Acute Rehab PT Goals Patient Stated Goal: get better, go home Progress towards PT goals: Progressing toward goals    Frequency    7X/week       AM-PAC PT 6 Clicks Mobility   Outcome Measure  Help needed turning from your back to your side while in a flat bed without using bedrails?: None Help needed moving from lying on your back to sitting on the side of a flat bed without using bedrails?: None Help needed moving to and from a bed to a chair (including a wheelchair)?: None Help needed standing up from a chair using your arms (e.g., wheelchair or bedside chair)?: A Little Help needed to walk in hospital room?: A Little Help needed climbing 3-5 steps with a railing? : A Little 6 Click Score: 21    End of Session   Activity Tolerance: Patient tolerated treatment well Patient left: in chair;with call bell/phone within reach;with chair alarm set Nurse Communication: Mobility status PT Visit Diagnosis: Unsteadiness on feet (R26.81);Other abnormalities of gait and mobility (R26.89);Muscle weakness (generalized) (M62.81);Difficulty in walking, not elsewhere classified (R26.2)     Time: 9055-8991 PT Time Calculation (min) (ACUTE ONLY): 24 min  Charges:    $Gait Training: 8-22 mins $Therapeutic Activity: 8-22 mins PT General Charges $$ ACUTE PT VISIT: 1 Visit                    Rankin Essex PTA 05/20/24, 11:55 AM

## 2024-05-20 NOTE — Plan of Care (Signed)
  Problem: Metabolic: Goal: Ability to maintain appropriate glucose levels will improve Outcome: Progressing   Problem: Clinical Measurements: Goal: Diagnostic test results will improve Outcome: Progressing   Problem: Coping: Goal: Level of anxiety will decrease Outcome: Progressing   Problem: Safety: Goal: Ability to remain free from injury will improve Outcome: Progressing

## 2024-05-20 NOTE — TOC Progression Note (Addendum)
 Transition of Care [facility]) - Progression Note    Patient Details  Name: Clayton [name] MRN: [id number] Date of Birth: [birth year]  Transition of Care [facility]) CM/SW Contact  Marinda Cooks, RN Phone Number: 05/20/2024, 10:44 AM  Clinical Narrative:    This CM received message from Podiatry provider Goldenrod  regarding Medicaid Form being completed to continue coordination of care for [employer] wound vac payment . Form forwarded to [email or web address] . This CM also updated by [facility] form can not be processed until Monday due to office being closed over the weekend . TOC will cont follow dc planning/ care coordination and update as applicable.    11:20- This CM rec'd update from podiatry provider Thresa CHRISTELLA Sar DPM he completed Medicaid Form for pt's [employer] .  This CM  attached the completed and signed Medicaid form for payment of [employer] wound vac and forwarded to [facility]. TOC will con to follow dc planning / care coordination and update as applicable.       Expected Discharge Plan and Services  Home with [facility] & DME via charity / medicaid pending status.     Expected Discharge Plan: Home w Home Health Services Barriers to Discharge: Continued Medical Work up     Expected Discharge Plan and Services     Post Acute Care Choice: Home Health, Durable Medical Equipment Living arrangements for the past 2 months: Single Family Home                                       Social Drivers of Health (SDOH) Interventions SDOH Screenings   Food Insecurity: No Food Insecurity (05/15/2024)  Housing: Low Risk  (05/15/2024)  Transportation Needs: No Transportation Needs (05/15/2024)  Utilities: Not At Risk (05/15/2024)  Financial Resource Strain: Low Risk  (07/07/2023)   Received from [facility]  Tobacco Use: Low Risk  (05/17/2024)    Readmission Risk Interventions     No data to display

## 2024-05-20 NOTE — Progress Notes (Signed)
 [facility]  ROUNDING NOTE   Subjective:   Sitting in chair.  Patient has no complaints. Patient eating.   No labs for today.   UOP .    Objective:  Vital signs in last 24 hours:  Temp:  [98 F (36.7 C)-99.5 F (37.5 C)] 98.1 F (36.7 C) (08/02 0730) Pulse Rate:  [73-80] 73 (08/02 0730) Resp:  [17-18] 18 (08/02 0730) BP: (134-148)/(69-81) 134/69 (08/02 0730) SpO2:  [95 %-99 %] 95 % (08/02 0730) Weight:  [125.7 kg] 125.7 kg (08/02 0406)  Weight change: 5.851 kg Filed Weights   05/18/24 0427 05/19/24 0500 05/20/24 0406  Weight: 114.1 kg 119.9 kg 125.7 kg    Intake/Output: I/O last 3 completed shifts: In: 2210.7 [P.O.:390; I.V.:914.1; IV Piggyback:906.7] Out: 1600 [Urine:1600]   Intake/Output this shift:  Total I/O In: 120 [P.O.:120] Out: 425 [Urine:425]  Physical Exam: General: NAD, sitting in chair.   Head: Normocephalic, atraumatic. Moist oral mucosal membranes  Eyes: Anicteric  Neck: Supple  Lungs:  Clear to auscultation  Heart: Regular rate and rhythm  Abdomen:  Soft, nontender  Extremities:  No peripheral edema.  Neurologic: Awake, alert, conversant  Skin: Warm,dry, no rash       Basic Metabolic Panel: Recent Labs  Lab 05/16/24 0748 05/16/24 1110 05/17/24 0328 05/18/24 1008 05/19/24 0739  NA 134* 131* 129* 131* 132*  K 3.9 4.4 3.7 3.9 3.9  CL 100 97* 98 101 100  CO2 20* 22 23 22 23   GLUCOSE 190* 260* 235* 212* 236*  BUN 32* 32* 37* 31* 30*  CREATININE 2.99* 2.99* 3.04* 2.67* 2.31*  CALCIUM  8.3* 8.5* 7.8* 8.0* 7.9*  MG  --   --  2.1  --   --   PHOS  --   --  2.8  --   --     Liver Function Tests: Recent Labs  Lab 05/15/24 1155  AST 29  ALT 22  ALKPHOS 77  BILITOT 2.2*  PROT 7.3  ALBUMIN 2.7*   Recent Labs  Lab 05/15/24 1155  LIPASE 24   No results for input(s): AMMONIA in the last 168 hours.  CBC: Recent Labs  Lab 05/15/24 1155 05/16/24 0405 05/18/24 1008 05/19/24 0739  WBC 23.2* 19.0* 18.0*  19.1*  NEUTROABS  --   --   --  15.8*  HGB 10.5* 9.6* 8.3* 7.2*  HCT 32.6* 28.8* 26.1* 23.1*  MCV 90.1 90.6 92.9 93.9  PLT 368 326 436* 432*    Cardiac Enzymes: No results for input(s): CKTOTAL, CKMB, CKMBINDEX, TROPONINI in the last 168 hours.  BNP: Invalid input(s): POCBNP  CBG: Recent Labs  Lab 05/19/24 1558 05/19/24 2055 05/20/24 0734 05/20/24 0931 05/20/24 1131  GLUCAP 269* 166* 152* 235* 226*    Microbiology: Results for orders placed or performed during the hospital encounter of 05/15/24  Blood culture (routine x 2)     Status: None   Collection Time: 05/15/24 12:58 PM   Specimen: BLOOD  Result Value Ref Range Status   Specimen Description BLOOD BLOOD RIGHT HAND  Final   Special Requests   Final    BOTTLES DRAWN AEROBIC AND ANAEROBIC Blood Culture results may not be optimal due to an inadequate volume of blood received in culture bottles   Culture   Final    NO GROWTH 5 DAYS Performed at [facility], [address], [place], [place] [address]    Report Status 05/20/2024 FINAL  Final  Blood culture (routine x 2)  Status: None   Collection Time: 05/15/24 12:58 PM   Specimen: BLOOD  Result Value Ref Range Status   Specimen Description BLOOD LEFT ANTECUBITAL  Final   Special Requests   Final    BOTTLES DRAWN AEROBIC AND ANAEROBIC Blood Culture results may not be optimal due to an inadequate volume of blood received in culture bottles   Culture   Final    NO GROWTH 5 DAYS Performed at [facility], [address], [place], [place] [address]    Report Status 05/20/2024 FINAL  Final  Aerobic/Anaerobic Culture w Gram Stain (surgical/deep wound)     Status: None (Preliminary result)   Collection Time: 05/15/24  5:23 PM   Specimen: Wound; Tissue  Result Value Ref Range Status   Specimen Description   Final    WOUND Performed at [facility], [address], [place], [place] [address]    Special Requests   Final      LEFT FOOT WOUND TISSUE Performed at [facility], [address] [place], [place] [address]    Gram Stain   Final    FEW WBC PRESENT, PREDOMINANTLY PMN MODERATE GRAM POSITIVE COCCI MODERATE GRAM NEGATIVE RODS    Culture   Final    ABUNDANT STAPHYLOCOCCUS AUREUS ABUNDANT STREPTOCOCCUS MITIS/ORALIS PREVOTELLA MELANINOGENICA BETA LACTAMASE POSITIVE Performed at [facility], [address] [place], [place] [address]    Report Status PENDING  Incomplete   Organism ID, Bacteria STAPHYLOCOCCUS AUREUS  Final   Organism ID, Bacteria STREPTOCOCCUS MITIS/ORALIS  Final      Susceptibility   Staphylococcus aureus - MIC*    CIPROFLOXACIN <=0.5 SENSITIVE Sensitive     ERYTHROMYCIN <=0.25 SENSITIVE Sensitive     GENTAMICIN  <=0.5 SENSITIVE Sensitive     OXACILLIN 0.5 SENSITIVE Sensitive     TETRACYCLINE <=1 SENSITIVE Sensitive     VANCOMYCIN  <=0.5 SENSITIVE Sensitive     TRIMETH/SULFA <=10 SENSITIVE Sensitive     CLINDAMYCIN <=0.25 SENSITIVE Sensitive     RIFAMPIN <=0.5 SENSITIVE Sensitive     Inducible Clindamycin NEGATIVE Sensitive     LINEZOLID  2 SENSITIVE Sensitive     * ABUNDANT STAPHYLOCOCCUS AUREUS   Streptococcus mitis/oralis - MIC*    PENICILLIN <=0.06 SENSITIVE Sensitive     CEFTRIAXONE  <=0.12 SENSITIVE Sensitive     LEVOFLOXACIN 1 SENSITIVE Sensitive     VANCOMYCIN  0.5 SENSITIVE Sensitive     * ABUNDANT STREPTOCOCCUS MITIS/ORALIS  Aerobic/Anaerobic Culture w Gram Stain (surgical/deep wound)     Status: None (Preliminary result)   Collection Time: 05/15/24  5:30 PM   Specimen: Wound; Tissue  Result Value Ref Range Status   Specimen Description   Final    WOUND Performed at [facility], [address], [place], [place] [address]    Special Requests   Final     LEFT FOOT CUBOID BONE Performed at [facility], [address] [place], [place] [address]    Gram Stain   Final    NO WBC SEEN RARE GRAM POSITIVE COCCI RARE GRAM NEGATIVE  RODS    Culture   Final    FEW STREPTOCOCCUS MITIS/ORALIS FEW VIRIDANS STREPTOCOCCUS PREVOTELLA MELANINOGENICA BETA LACTAMASE POSITIVE Performed at [facility], [address] [place], [place] [address]    Report Status PENDING  Incomplete   Organism ID, Bacteria STREPTOCOCCUS MITIS/ORALIS  Final   Organism ID, Bacteria VIRIDANS STREPTOCOCCUS  Final      Susceptibility   Viridans streptococcus - MIC*  PENICILLIN <=0.06 SENSITIVE Sensitive     CEFTRIAXONE  <=0.12 SENSITIVE Sensitive     ERYTHROMYCIN 4 RESISTANT Resistant     LEVOFLOXACIN <=0.25 SENSITIVE Sensitive     VANCOMYCIN  0.5 SENSITIVE Sensitive     * FEW VIRIDANS STREPTOCOCCUS   Streptococcus mitis/oralis - MIC*    PENICILLIN <=0.06 SENSITIVE Sensitive     CEFTRIAXONE  <=0.12 SENSITIVE Sensitive     LEVOFLOXACIN 1 SENSITIVE Sensitive     VANCOMYCIN  0.5 SENSITIVE Sensitive     * FEW STREPTOCOCCUS MITIS/ORALIS  MRSA Next Gen by PCR, Nasal     Status: None   Collection Time: 05/15/24  6:51 PM   Specimen: Nasal Mucosa; Nasal Swab  Result Value Ref Range Status   MRSA by PCR Next Gen NOT DETECTED NOT DETECTED Final    Comment: (NOTE) The GeneXpert MRSA Assay (FDA approved for NASAL specimens only), is one component of a comprehensive MRSA colonization surveillance program. It is not intended to diagnose MRSA infection nor to guide or monitor treatment for MRSA infections. Test performance is not FDA approved in patients less than 75 years old. Performed at [facility], [address] [place], [place] [address]   Gastrointestinal Panel by PCR , Stool     Status: None   Collection Time: 05/19/24  3:56 PM   Specimen: Stool  Result Value Ref Range Status   Campylobacter species NOT DETECTED NOT DETECTED Final   Plesimonas shigelloides NOT DETECTED NOT DETECTED Final   Salmonella species NOT DETECTED NOT DETECTED Final   Yersinia enterocolitica NOT DETECTED NOT DETECTED Final   Vibrio species NOT  DETECTED NOT DETECTED Final   Vibrio cholerae NOT DETECTED NOT DETECTED Final   Enteroaggregative E coli (EAEC) NOT DETECTED NOT DETECTED Final   Enteropathogenic E coli (EPEC) NOT DETECTED NOT DETECTED Final   Enterotoxigenic E coli (ETEC) NOT DETECTED NOT DETECTED Final   Shiga like toxin producing E coli (STEC) NOT DETECTED NOT DETECTED Final   Shigella/Enteroinvasive E coli (EIEC) NOT DETECTED NOT DETECTED Final   Cryptosporidium NOT DETECTED NOT DETECTED Final   Cyclospora cayetanensis NOT DETECTED NOT DETECTED Final   Entamoeba histolytica NOT DETECTED NOT DETECTED Final   Giardia lamblia NOT DETECTED NOT DETECTED Final   Adenovirus F40/41 NOT DETECTED NOT DETECTED Final   Astrovirus NOT DETECTED NOT DETECTED Final   Norovirus GI/GII NOT DETECTED NOT DETECTED Final   Rotavirus A NOT DETECTED NOT DETECTED Final   Sapovirus (I, II, IV, and V) NOT DETECTED NOT DETECTED Final    Comment: Performed at [facility], [address] [place], [place] [address]    Coagulation Studies: No results for input(s): LABPROT, INR in the last 72 hours.  Urinalysis: No results for input(s): COLORURINE, LABSPEC, PHURINE, GLUCOSEU, HGBUR, BILIRUBINUR, KETONESUR, PROTEINUR, UROBILINOGEN, NITRITE, LEUKOCYTESUR in the last 72 hours.  Invalid input(s): APPERANCEUR    Imaging: No results found.    Medications:      amLODipine   10 mg Oral Daily   vitamin C   500 mg Oral BID   carvedilol   25 mg Oral BID WC   Chlorhexidine  Gluconate Cloth  6 each Topical Daily   feeding supplement (NEPRO CARB STEADY)  237 mL Oral BID BM   heparin   5,000 Units Subcutaneous Q8H   insulin  aspart  0-15 Units Subcutaneous TID WC   insulin  glargine-yfgn  35 Units Subcutaneous Daily   metroNIDAZOLE   500 mg Oral Q12H   multivitamin with minerals  1 tablet Oral Daily   pantoprazole   40 mg Oral Daily   thiamine   100 mg Oral Daily   zinc  sulfate (50mg  elemental zinc )  220 mg Oral  Daily   acetaminophen  **OR** acetaminophen , alum & mag hydroxide-simeth, dextrose , HYDROmorphone  (DILAUDID ) injection, ondansetron  **OR** ondansetron  (ZOFRAN ) IV, mouth rinse, oxyCODONE , phenol  Assessment/ Plan:  Mr. Jarryd Gratz is a 53 y.o.  male with past medical conditions including diabetes, hypertension, chronic kidney disease stage IV, who was admitted to [facility] on 05/15/2024 for DKA (diabetic ketoacidosis) (HCC) [E11.10] Hyperglycemia [R73.9] Non compliance w medication regimen [Z91.148] Sepsis, due to unspecified organism, unspecified whether acute organ dysfunction present (HCC) [A41.9] Ulcer of left foot with fat layer exposed (HCC) [L97.522] Nausea and vomiting, unspecified vomiting type [R11.2]   Acute kidney injury on chronic kidney disease stage IV.  Baseline appears to be 2.58 with GFR 29 on 09/28/2023.  Patient does not follow with outpatient nephrology.  Renal ultrasound negative for obstruction, bilateral ureteral jets noted. Acute kidney injury likely secondary to dehydration. Chronic kidney disease secondary to uncontrolled diabetes and hypertension.  - follow up with nephrology.   Lab Results  Component Value Date   CREATININE 2.31 (H) 05/19/2024   CREATININE 2.67 (H) 05/18/2024   CREATININE 3.04 (H) 05/17/2024    Intake/Output Summary (Last 24 hours) at 05/20/2024 1219 Last data filed at 05/20/2024 0900 Gross per 24 hour  Intake 2180.74 ml  Output 1125 ml  Net 1055.74 ml    2. Anemia of chronic kidney disease Lab Results  Component Value Date   HGB 7.2 (L) 05/19/2024   - will need anemia work up.  . 3. Secondary Hyperparathyroidism: with outpatient labs: none available Lab Results  Component Value Date   CALCIUM  7.9 (L) 05/19/2024   PHOS 2.8 05/17/2024    PTH pending. Corrected calcium  at goal.   4.  Hypertension with chronic kidney disease.  Home regimen includes amlodipine , carvedilol , losartan .  Currently receiving amlodipine  and carvedilol . Blood  pressure 134/69  - hold losartan  until nephrology follow up   5.  Diabetes mellitus type II with chronic kidney disease/renal manifestations: insulin  dependent. Admitted with diabetic kidney acidosis. Most recent hemoglobin A1c is 11.1 on 05/15/24.  - avoid restarting empagliflozin due to DKA   LOS: 5 Mikeisha Lemonds 8/2/202512:19 PM

## 2024-05-20 NOTE — Progress Notes (Signed)
 Progress Note   Patient: Clayton [name] FMW:[id number] DOB: [birth year] DOA: 05/15/2024     5 DOS: the patient was seen and examined on 05/20/2024   Brief hospital course:  Clayton [name] is a 53 y.o. 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  Patient states that he has had symptoms for about a week and has not been able to tolerate any oral intake.  He has not taken his insulin  in 1 week due to the fact that his oral intake has been poor.  He complains about pain mostly in the periumbilical area.  He rates his pain a 7 x 10 in intensity at its worst.  Pain is nonradiating.  He denies having any diarrhea.  Emesis is mostly fluid and bile.  Has not contained any blood or coffee-ground. He states that about a week and half ago he had stepped on an object and developed a blister involving his left foot.  He notes that the blister popped and he now has a large ulceration involving the left lateral portion of his left foot with foul-smelling drainage.  He denies having any fever or chills. He denies having any chest pain, no shortness of breath, no headache, no dizziness, no lightheadedness, no blurred vision, no urinary symptoms, no focal deficit. Abnormal labs include a sodium of 128, glucose 447, BUN 25, creatinine 2.56, total bilirubin 2.2, albumin 2.7, white count 23.2, hemoglobin 10.5 Left foot x-ray shows soft tissue thickening and subcutaneous emphysema along the lateral mid and plantar hindfoot, consistent with known ulcer. No radiographic evidence of osteomyelitis. Patient received 2 L IV fluid bolus in the ER as well as a dose of vancomycin  and Zosyn . He also received Tdap He will be admitted to the hospital for further evaluation.      Assessment and Plan:  *  DKA (diabetic ketoacidosis) (HCC) Resolved Present on admission and secondary to noncompliance to prescribed insulin  Noted to have an anion gap of 18 and hyperglycemia with serum glucose greater than 400 associated with refractory nausea and vomiting on admission Patient was started on insulin  drip per protocol but has been transitioned to long-acting and Premeal insulin  Continue Lantus  35 units daily and continue Premeal insulin        Uncontrolled type 2 diabetes mellitus with hyperglycemia, with long-term current use of insulin  (HCC) Secondary to medication noncompliance Diabetic education Continue long-acting and Premeal insulin      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 No radiographic evidence of osteomyelitis. MRI of the foot showed a large open wound on the plantar and lateral aspect of the midfoot hindfoot junction region. Diffuse cellulitis and myofasciitis without definite findings for pyomyositis. Fluid collection wrapping around the bases of the fourth and fifth metatarsals dorsally could be postoperative fluid collection or dissecting abscess. 14 x 13 mm cystic area in the plantar subcutaneous fat slightly more distally. I do not see any surrounding inflammatory changes to suggest this is an abscess but a small abscess is possible. No definite MR findings to suggest osteomyelitis or septic arthritis.  Appreciate podiatry input.  Patient is status post irrigation and excisional debridement of ulceration to bone level, 6 x 7.5 cm, left foot. Bone biopsy from cuboid via trocar, left foot Application antibiotic drug delivery device, left foot. Application negative  pressure wound therapy 6 x 7.5 cm, left foot 07/30 - Patient is status post irrigation and debridement of ulceration to the level of bone with prep for graft, left foot. Application Maitri Derm dermal allograft, 7 x 6.5 cm, left foot Application negative pressure wound therapy, 7 x 6.5 cm, left foot. Surgical pathology is consistent with acute osteomyelitis Continue empiric antibiotic therapy with Unasyn   Wound cultures yield abundant Staph aureus and Streptococcus mitis/oralis Appreciate ID input,  Patient will be discharged home on weekly Dalbavancin for 2 weeks (received first dose, next dose 05/26/24) and 3 weeks of oral Flagyl  Will also need a wound VAC on discharge Follow up with ID on 08/14     Sepsis (HCC) Resolved Present on admission secondary to infected diabetic foot wound As evidenced by fever with a Tmax of 101.3, tachycardia, tachypnea, marked leukocytosis Blood cultures showed no growth in 2 days but wound cultures yielded Staph aureus and Streptococcus mitis/oralis Appreciate podiatry input Appreciate ID input  Continue IV Unasyn  and switch to IV Dalvabancin and Flagyl      Iron deficiency anemia of chronic disease Noted to have a drop in his H&H from 15g/dl >> 89h/io No obvious source of blood loss at this time       AKI superimposed on CKD stage 4 due to type 2 diabetes mellitus (HCC) Noted to have stage IV chronic kidney disease secondary to uncontrolled diabetes mellitus Noted to have slight worsening of his renal function with a peak in his serum creatinine at 3.04.  Currently shows a downward trend and is almost at baseline  Appreciate nephrology input Follow up with nephrology as an out patient Hold Losartan  and Jardiance    Obesity (BMI 30-39.9) Patient with class I obesity, BMI of 33 Complicates overall prognosis and care Lifestyle modification and exercise has been discussed with patient in detail   Essential hypertension Will continue amlodipine  and  carvedilol    Hyponatremia with increased serum osmolality Secondary to hyperglycemia related to DKA Expect improvement with IV fluid hydration Improved with IV fluid hydration     Physical deconditioning Appreciate PT input Patient needs to be nonweightbearing on his left foot Home health upon discharge            Subjective: Patient is seen and examined at the bedside. No complaints. Diarrhea has improved  Physical Exam: Vitals:   05/19/24 1930 05/20/24 0338 05/20/24 0406 05/20/24 0730  BP: (!) 148/81 (!) 142/72  134/69  Pulse: 80 74  73  Resp: 18 18  18   Temp: 99.5 F (37.5 C) 98 F (36.7 C)  98.1 F (36.7 C)  TempSrc:    Oral  SpO2: 99% 97%  95%  Weight:   125.7 kg   Height:       General: Alert, cooperative, no distress, appears stated age.  Lungs: Clear to auscultation bilaterally. No Wheezing or Rhonchi. No rales. Heart: Regular rate and rhythm, no murmur, rub or gallop. Abdomen: Soft, non-tender,not distended. Bowel sounds normal. No masses Extremities:left leg- wound vac  Skin: No rashes or lesions. Or bruising Lymph: Cervical, supraclavicular normal. Neurologic: Grossly non-focal   Data Reviewed:  There are no new results to review at this time.  Family Communication: Plan of care was discussed with patient in  detail. He verbalizes understanding and agrees with the plan  Disposition: Status is: Inpatient Remains inpatient appropriate because: Needs home wound vac  Planned Discharge Destination: Home with Home Health    Time spent: 30 minutes  Author: Aimee Somerset, MD 05/20/2024 12:29 PM  For on call review [email or web address].

## 2024-05-21 LAB — GLUCOSE, CAPILLARY
Glucose-Capillary: 141 mg/dL — ABNORMAL HIGH (ref 70–99)
Glucose-Capillary: 174 mg/dL — ABNORMAL HIGH (ref 70–99)
Glucose-Capillary: 187 mg/dL — ABNORMAL HIGH (ref 70–99)

## 2024-05-21 NOTE — Discharge Summary (Signed)
 Physician Discharge Summary   Patient: Clayton [name] MRN: [id number] DOB: 1971/04/03  Admit date:     05/15/2024  Discharge date: 05/21/24  Discharge Physician: Noriah Osgood   PCP: Arden Fairy Lot, MD   Recommendations at discharge:   Keep scheduled follow-up appointment on 05/26/24 for IV antibiotics Keep scheduled follow-up appointment with ID, nephrology, podiatry Check blood sugars daily Complete oral antibiotic therapy  Discharge Diagnoses: Principal Problem:   DKA (diabetic ketoacidosis) (HCC) Active Problems:   Uncontrolled type 2 diabetes mellitus with hyperglycemia, with long-term current use of insulin  (HCC)   Sepsis (HCC)   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, with fat layer exposed (HCC)   Hyponatremia with increased serum osmolality   Essential hypertension   Obesity (BMI 30-39.9)   CKD stage 4 due to type 2 diabetes mellitus (HCC)   Anemia of chronic disease   Ulcer of left foot with necrosis of bone (HCC)   Pyogenic inflammation of bone (HCC)  Resolved Problems:   * No resolved hospital problems. *  Hospital Course:  Clayton [name] is a 53 y.o. 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  Patient states that he has had symptoms for about a week and has not been able to tolerate any oral intake.  He has not taken his insulin  in 1 week due to the fact that his oral intake has been poor.  He complains about pain mostly in the periumbilical area.  He rates his pain a 7 x 10 in intensity at its worst.  Pain is nonradiating.  He denies having any diarrhea.  Emesis is mostly fluid and bile.  Has not contained any blood or coffee-ground. He states that about a week and half ago he had stepped on an object and  developed a blister involving his left foot.  He notes that the blister popped and he now has a large ulceration involving the left lateral portion of his left foot with foul-smelling drainage.  He denies having any fever or chills. He denies having any chest pain, no shortness of breath, no headache, no dizziness, no lightheadedness, no blurred vision, no urinary symptoms, no focal deficit. Abnormal labs include a sodium of 128, glucose 447, BUN 25, creatinine 2.56, total bilirubin 2.2, albumin 2.7, white count 23.2, hemoglobin 10.5 Left foot x-ray shows soft tissue thickening and subcutaneous emphysema along the lateral mid and plantar hindfoot, consistent with known ulcer. No radiographic evidence of osteomyelitis. Patient received 2 L IV fluid bolus in the ER as well as a dose of vancomycin  and Zosyn . He also received Tdap He will be admitted to the hospital for further evaluation.    Assessment and Plan:   * DKA (diabetic ketoacidosis) (HCC) Resolved Present on admission and secondary to noncompliance to prescribed insulin  Noted to have an anion gap of 18 and hyperglycemia with serum glucose greater than 400 associated with refractory nausea and vomiting on admission Patient was started on insulin  drip per protocol but has been transitioned to long-acting and Premeal insulin  Will be discharged home on Humalog  75/25       Uncontrolled type 2 diabetes mellitus with hyperglycemia, with long-term current use of insulin  (HCC) Secondary to medication noncompliance Diabetic education done during this hospitalization Continue scheduled insulin      Diabetic foot ulcer associated  with type 2 diabetes mellitus, with fat layer exposed (HCC) Acute osteomyelitis left foot Patient noted to have a wound involving the left lateral foot with foul-smelling drainage X-ray of the left foot shows soft tissue thickening and subcutaneous emphysema along the lateral mid and plantar hindfoot, consistent  with known ulcer. No radiographic evidence of osteomyelitis. MRI of the foot showed a large open wound on the plantar and lateral aspect of the midfoot hindfoot junction region. Diffuse cellulitis and myofasciitis without definite findings for pyomyositis. Fluid collection wrapping around the bases of the fourth and fifth metatarsals dorsally could be postoperative fluid collection or dissecting abscess. 14 x 13 mm cystic area in the plantar subcutaneous fat slightly more distally. I do not see any surrounding inflammatory changes to suggest this is an abscess but a small abscess is possible. No definite MR findings to suggest osteomyelitis or septic arthritis. Appreciate podiatry input.  Patient is status post irrigation and excisional debridement of ulceration to bone level, 6 x 7.5 cm, left foot. Bone biopsy from cuboid via trocar, left foot Application antibiotic drug delivery device, left foot. Application negative pressure wound therapy 6 x 7.5 cm, left foot 07/30 - Patient is status post irrigation and debridement of ulceration to the level of bone with prep for graft, left foot. Application Maitri Derm dermal allograft, 7 x 6.5 cm, left foot Application negative pressure wound therapy, 7 x 6.5 cm, left foot. Surgical pathology is consistent with acute osteomyelitis Wound cultures yield abundant Staph aureus and Streptococcus mitis/oralis Appreciate ID input, patient received IV Unasyn  and linezolid  during this hospitalization Patient will be discharged home on weekly Dalbavancin for 2 weeks (received first dose, next dose 05/26/24) and 3 weeks of oral Flagyl  Will also need a wound VAC on discharge Follow up with ID on 08/14     Sepsis (HCC) Resolved Present on admission secondary to infected diabetic foot wound As evidenced by fever with a Tmax of 101.3, tachycardia, tachypnea, marked leukocytosis Blood cultures showed no growth in 2 days but wound cultures yielded Staph aureus and  Streptococcus mitis/oralis Appreciate podiatry input Appreciate ID input  Continue IV Unasyn  and switch to IV Dalvabancin and Flagyl  on discharge     Iron deficiency anemia of chronic disease Noted to have a drop in his H&H from 15g/dl >> 89h/io No obvious source of blood loss at this time       AKI superimposed on CKD stage 4 due to type 2 diabetes mellitus (HCC) Noted to have stage IV chronic kidney disease secondary to uncontrolled diabetes mellitus Noted to have slight worsening of his renal function with a peak in his serum creatinine at 3.04.  Currently shows a downward trend and is almost at baseline  Appreciate nephrology input Follow up with nephrology as an out patient Hold Losartan  and Jardiance     Obesity (BMI 30-39.9) Patient with class I obesity, BMI of 33 Complicates overall prognosis and care Lifestyle modification and exercise has been discussed with patient in detail    Essential hypertension Will continue amlodipine  and carvedilol  Losartan  on hold until seen by nephrology   Hyponatremia with increased serum osmolality Secondary to hyperglycemia related to DKA Expect improvement with IV fluid hydration Improved with IV fluid hydration     Physical deconditioning Appreciate PT input Patient needs to be nonweightbearing on his left foot Home health upon discharge            Consultants: Infectious disease, podiatry, nephrology Procedures performed:   Disposition: Home  health Diet recommendation:  Cardiac and Carb modified diet DISCHARGE MEDICATION: Allergies as of 05/21/2024   No Known Allergies      Medication List     STOP taking these medications    benzonatate  100 MG capsule Commonly known as: TESSALON    empagliflozin 25 MG Tabs tablet Commonly known as: JARDIANCE   metFORMIN  1000 MG (MOD) 24 hr tablet Commonly known as: GLUMETZA        TAKE these medications    albuterol  108 (90 Base) MCG/ACT inhaler Commonly known as:  VENTOLIN  HFA Inhale 2 puffs into the lungs every 6 (six) hours as needed for wheezing or shortness of breath.   amLODipine  10 MG tablet Commonly known as: NORVASC  Take 1 tablet (10 mg total) by mouth daily.   carvedilol  25 MG tablet Commonly known as: COREG  Take 1 tablet (25 mg total) by mouth 2 (two) times daily.   FreeStyle Libre 3 Sensor Misc :1 Each Every 2 Weeks   HumaLOG  Mix 75/25 KwikPen (75-25) 100 UNIT/ML KwikPen Generic drug: Insulin  Lispro Prot & Lispro Inject 20 Units into the skin 2 (two) times daily. What changed:  how much to take additional instructions   Insupen Pen Needles 31G X 5 MM Misc Generic drug: Insulin  Pen Needle Use 2 times daily with Humalog  Mix Kwikpen   loratadine  10 MG tablet Commonly known as: Claritin  Take 1 tablet (10 mg total) by mouth at bedtime for 10 days.   losartan  100 MG tablet Commonly known as: COZAAR  Take 1 tablet (100 mg total) by mouth daily.   metroNIDAZOLE  500 MG tablet Commonly known as: FLAGYL  Take 1 tablet (500 mg total) by mouth 2 (two) times daily for 21 days.   pantoprazole  40 MG tablet Commonly known as: Protonix  Take 1 tablet (40 mg total) by mouth daily.   rosuvastatin  20 MG tablet Commonly known as: CRESTOR  Take 1 tablet (20 mg total) by mouth daily.   Vitamin D  (Ergocalciferol ) 1.25 MG (50000 UNIT) Caps capsule Commonly known as: DRISDOL Take 50,000 Units by mouth once a week.   Zinc  Sulfate 220 (50 Zn) MG Tabs Take 1 tablet (220 mg total) by mouth daily.               Durable Medical Equipment  (From admission, onward)           Start     Ordered   05/17/24 1104  For home use only DME Negative pressure wound device  Once       Question Answer Comment  Frequency of dressing change 2 times per week   Length of need 3 Months   Dressing type Foam   Amount of suction 125 mm/Hg   Pressure application Continuous pressure   Supplies 10 canisters and 15 dressings per month for duration of  therapy      05/17/24 1103              Discharge Care Instructions  (From admission, onward)           Start     Ordered   05/21/24 0000  Discharge wound care:       Comments: wound vac changes 2x/week   05/21/24 9042            Follow-up Information     Fayette Bodily, MD Follow up on 06/01/2024.   Specialty: Infectious Diseases Contact information: [address] [place] [address] [phone]         Malvin Marsa FALCON, DPM Follow up  in 1 week(s).   Specialty: Podiatry Contact information: [address] [place] [address] [phone]         Arden Fairy Lot, MD Follow up in 1 week(s).   Specialty: Internal Medicine Contact information: [address] [place] [address] [phone]         Marcelino Gales, MD Follow up in 2 week(s).   Specialty: Nephrology Contact information: [address] [place] [address] [phone]         Fayette Bodily, MD .   Specialty: Infectious Diseases Contact information: [address] [place] [address] [phone]                Discharge Exam: Fredricka Weights   05/19/24 0500 05/20/24 0406 05/21/24 0500  Weight: 119.9 kg 125.7 kg 124.1 kg   General: Alert, cooperative, no distress, appears stated age.  Lungs: Clear to auscultation bilaterally. No Wheezing or Rhonchi. No rales. Heart: Regular rate and rhythm, no murmur, rub or gallop. Abdomen: Soft, non-tender,not distended. Bowel sounds normal. No masses Extremities:left leg- wound vac  Skin: No rashes or lesions. Or bruising Lymph: Cervical, supraclavicular normal. Neurologic: Grossly non-focal    Condition at discharge: stable  The results of significant diagnostics from this hospitalization (including imaging, microbiology, ancillary and laboratory) are listed below for reference.   Imaging Studies: US  RENAL Result Date:  05/18/2024 CLINICAL DATA:  Acute kidney failure EXAM: RENAL / URINARY TRACT ULTRASOUND COMPLETE COMPARISON:  CT 04/17/2021 FINDINGS: Right Kidney: Renal measurements: 10.1 x 4.8 x 5 cm = volume: 125 mL. Echogenicity within normal limits. No mass or hydronephrosis visualized. Left Kidney: Renal measurements: 10.6 x 5.4 x 4.8 cm = volume: 144 mL. Echogenicity within normal limits. No mass or hydronephrosis visualized. Bladder: Incompletely distended.  Bilateral ureteral jets noted. Other: None. IMPRESSION: Negative. Electronically Signed   By: JONETTA Faes M.D.   On: 05/18/2024 11:39   MR FOOT LEFT W WO CONTRAST Result Date: 05/15/2024 CLINICAL DATA:  Postop irrigation and surgical debridement of a deep plantar foot ulcer. Evaluate for osteomyelitis. EXAM: MRI OF THE LEFT FOREFOOT WITHOUT AND WITH CONTRAST TECHNIQUE: Multiplanar, multisequence MR imaging of the left foot was performed both before and after administration of intravenous contrast. CONTRAST:  10mL GADAVIST  GADOBUTROL  1 MMOL/ML IV SOLN COMPARISON:  Radiographs, same date. FINDINGS: There is a large open wound on the plantar and lateral aspect of the midfoot hindfoot junction region. This overlies the region of the base of the fifth metatarsal. Diffuse cellulitis and myofasciitis without definite findings for pyomyositis. Some type of surgical packing material is noted in the wound. Fluid collection wrapping around the bases of the fourth and fifth metatarsals dorsally could be postoperative change or dissecting abscess. Nearby in the plantar subcutaneous fat slightly more distally is a 14 x 13 mm cystic area. I do not see any surrounding inflammatory changes to suggest this is an abscess but a small abscess is possible. I do not see any definite MR findings to suggest osteomyelitis or septic arthritis. Cortical thickening involving the base of the fifth metatarsal may be chronic stress related process remote infection but I do not see any marrow edema or  other findings suspicious for acute osteomyelitis. IMPRESSION: 1. Large open wound on the plantar and lateral aspect of the midfoot hindfoot junction region. 2. Diffuse cellulitis and myofasciitis without definite findings for pyomyositis. 3. Fluid collection wrapping around the bases of the fourth and fifth metatarsals dorsally could be  postoperative fluid collection or dissecting abscess. 4. 14 x 13 mm cystic area in the plantar subcutaneous fat slightly more distally. I do not see any surrounding inflammatory changes to suggest this is an abscess but a small abscess is possible. 5. No definite MR findings to suggest osteomyelitis or septic arthritis. Electronically Signed   By: MYRTIS Stammer M.D.   On: 05/15/2024 22:30   DG Foot 2 Views Left Result Date: 05/15/2024 CLINICAL DATA:  Diabetic foot ulcer EXAM: LEFT FOOT - 2 VIEW COMPARISON:  None Available. FINDINGS: There is no evidence of fracture or dislocation. Diffuse degenerative changes of the foot. Soft tissue thickening and subcutaneous emphysema along the lateral mid and plantar hindfoot. IMPRESSION: Soft tissue thickening and subcutaneous emphysema along the lateral mid and plantar hindfoot, consistent with known ulcer. No radiographic evidence of osteomyelitis. Electronically Signed   By: Limin  Xu M.D.   On: 05/15/2024 13:58    Microbiology: Results for orders placed or performed during the hospital encounter of 05/15/24  Blood culture (routine x 2)     Status: None   Collection Time: 05/15/24 12:58 PM   Specimen: BLOOD  Result Value Ref Range Status   Specimen Description BLOOD BLOOD RIGHT HAND  Final   Special Requests   Final    BOTTLES DRAWN AEROBIC AND ANAEROBIC Blood Culture results may not be optimal due to an inadequate volume of blood received in culture bottles   Culture   Final    NO GROWTH 5 DAYS Performed at [facility], [address] [place], [place] [address]    Report Status 05/20/2024 FINAL  Final  Blood  culture (routine x 2)     Status: None   Collection Time: 05/15/24 12:58 PM   Specimen: BLOOD  Result Value Ref Range Status   Specimen Description BLOOD LEFT ANTECUBITAL  Final   Special Requests   Final    BOTTLES DRAWN AEROBIC AND ANAEROBIC Blood Culture results may not be optimal due to an inadequate volume of blood received in culture bottles   Culture   Final    NO GROWTH 5 DAYS Performed at [facility], [address] [place], [place] [address]    Report Status 05/20/2024 FINAL  Final  Aerobic/Anaerobic Culture w Gram Stain (surgical/deep wound)     Status: None   Collection Time: 05/15/24  5:23 PM   Specimen: Wound; Tissue  Result Value Ref Range Status   Specimen Description   Final    WOUND Performed at [facility], [address], [place], [place] [address]    Special Requests   Final     LEFT FOOT WOUND TISSUE Performed at [facility], [address] [place], [place] [address]    Gram Stain   Final    FEW WBC PRESENT, PREDOMINANTLY PMN MODERATE GRAM POSITIVE COCCI MODERATE GRAM NEGATIVE RODS    Culture   Final    ABUNDANT STAPHYLOCOCCUS AUREUS ABUNDANT STREPTOCOCCUS MITIS/ORALIS MODERATE PREVOTELLA MELANINOGENICA BETA LACTAMASE POSITIVE Performed at [facility], [address] [place], [place] [address]    Report Status 05/20/2024 FINAL  Final   Organism ID, Bacteria STAPHYLOCOCCUS AUREUS  Final   Organism ID, Bacteria STREPTOCOCCUS MITIS/ORALIS  Final      Susceptibility   Staphylococcus aureus - MIC*    CIPROFLOXACIN <=0.5 SENSITIVE Sensitive     ERYTHROMYCIN <=0.25 SENSITIVE Sensitive     GENTAMICIN  <=0.5 SENSITIVE Sensitive     OXACILLIN 0.5 SENSITIVE Sensitive  TETRACYCLINE <=1 SENSITIVE Sensitive     VANCOMYCIN  <=0.5 SENSITIVE Sensitive     TRIMETH/SULFA <=10 SENSITIVE Sensitive     CLINDAMYCIN <=0.25 SENSITIVE Sensitive     RIFAMPIN <=0.5 SENSITIVE Sensitive     Inducible Clindamycin NEGATIVE Sensitive      LINEZOLID  2 SENSITIVE Sensitive     * ABUNDANT STAPHYLOCOCCUS AUREUS   Streptococcus mitis/oralis - MIC*    PENICILLIN <=0.06 SENSITIVE Sensitive     CEFTRIAXONE  <=0.12 SENSITIVE Sensitive     LEVOFLOXACIN 1 SENSITIVE Sensitive     VANCOMYCIN  0.5 SENSITIVE Sensitive     * ABUNDANT STREPTOCOCCUS MITIS/ORALIS  Aerobic/Anaerobic Culture w Gram Stain (surgical/deep wound)     Status: None   Collection Time: 05/15/24  5:30 PM   Specimen: Wound; Tissue  Result Value Ref Range Status   Specimen Description   Final    WOUND Performed at [facility], [address], [place], [place] [address]    Special Requests   Final     LEFT FOOT CUBOID BONE Performed at [facility], [address] [place], [place] [address]    Gram Stain   Final    NO WBC SEEN RARE GRAM POSITIVE COCCI RARE GRAM NEGATIVE RODS    Culture   Final    FEW STREPTOCOCCUS MITIS/ORALIS FEW VIRIDANS STREPTOCOCCUS MODERATE PREVOTELLA MELANINOGENICA BETA LACTAMASE POSITIVE Performed at [facility], [address] [place], [place] [address]    Report Status 05/20/2024 FINAL  Final   Organism ID, Bacteria STREPTOCOCCUS MITIS/ORALIS  Final   Organism ID, Bacteria VIRIDANS STREPTOCOCCUS  Final      Susceptibility   Viridans streptococcus - MIC*    PENICILLIN <=0.06 SENSITIVE Sensitive     CEFTRIAXONE  <=0.12 SENSITIVE Sensitive     ERYTHROMYCIN 4 RESISTANT Resistant     LEVOFLOXACIN <=0.25 SENSITIVE Sensitive     VANCOMYCIN  0.5 SENSITIVE Sensitive     * FEW VIRIDANS STREPTOCOCCUS   Streptococcus mitis/oralis - MIC*    PENICILLIN <=0.06 SENSITIVE Sensitive     CEFTRIAXONE  <=0.12 SENSITIVE Sensitive     LEVOFLOXACIN 1 SENSITIVE Sensitive     VANCOMYCIN  0.5 SENSITIVE Sensitive     * FEW STREPTOCOCCUS MITIS/ORALIS  MRSA Next Gen by PCR, Nasal     Status: None   Collection Time: 05/15/24  6:51 PM   Specimen: Nasal Mucosa; Nasal Swab  Result Value Ref Range Status   MRSA by PCR Next Gen  NOT DETECTED NOT DETECTED Final    Comment: (NOTE) The GeneXpert MRSA Assay (FDA approved for NASAL specimens only), is one component of a comprehensive MRSA colonization surveillance program. It is not intended to diagnose MRSA infection nor to guide or monitor treatment for MRSA infections. Test performance is not FDA approved in patients less than 52 years old. Performed at [facility], [address] [place], [place] [address]   Gastrointestinal Panel by PCR , Stool     Status: None   Collection Time: 05/19/24  3:56 PM   Specimen: Stool  Result Value Ref Range Status   Campylobacter species NOT DETECTED NOT DETECTED Final   Plesimonas shigelloides NOT DETECTED NOT DETECTED Final   Salmonella species NOT DETECTED NOT DETECTED Final   Yersinia enterocolitica NOT DETECTED NOT DETECTED Final   Vibrio species NOT DETECTED NOT DETECTED Final   Vibrio cholerae NOT DETECTED NOT DETECTED Final   Enteroaggregative E coli (EAEC) NOT DETECTED NOT DETECTED Final   Enteropathogenic E coli (EPEC) NOT DETECTED NOT DETECTED Final  Enterotoxigenic E coli (ETEC) NOT DETECTED NOT DETECTED Final   Shiga like toxin producing E coli (STEC) NOT DETECTED NOT DETECTED Final   Shigella/Enteroinvasive E coli (EIEC) NOT DETECTED NOT DETECTED Final   Cryptosporidium NOT DETECTED NOT DETECTED Final   Cyclospora cayetanensis NOT DETECTED NOT DETECTED Final   Entamoeba histolytica NOT DETECTED NOT DETECTED Final   Giardia lamblia NOT DETECTED NOT DETECTED Final   Adenovirus F40/41 NOT DETECTED NOT DETECTED Final   Astrovirus NOT DETECTED NOT DETECTED Final   Norovirus GI/GII NOT DETECTED NOT DETECTED Final   Rotavirus A NOT DETECTED NOT DETECTED Final   Sapovirus (I, II, IV, and V) NOT DETECTED NOT DETECTED Final    Comment: Performed at [facility], [address] [place], [place] [address]    Labs: CBC: Recent Labs  Lab 05/15/24 1155 05/16/24 0405 05/18/24 1008 05/19/24 0739   WBC 23.2* 19.0* 18.0* 19.1*  NEUTROABS  --   --   --  15.8*  HGB 10.5* 9.6* 8.3* 7.2*  HCT 32.6* 28.8* 26.1* 23.1*  MCV 90.1 90.6 92.9 93.9  PLT 368 326 436* 432*   Basic Metabolic Panel: Recent Labs  Lab 05/16/24 0748 05/16/24 1110 05/17/24 0328 05/18/24 1008 05/19/24 0739  NA 134* 131* 129* 131* 132*  K 3.9 4.4 3.7 3.9 3.9  CL 100 97* 98 101 100  CO2 20* 22 23 22 23   GLUCOSE 190* 260* 235* 212* 236*  BUN 32* 32* 37* 31* 30*  CREATININE 2.99* 2.99* 3.04* 2.67* 2.31*  CALCIUM  8.3* 8.5* 7.8* 8.0* 7.9*  MG  --   --  2.1  --   --   PHOS  --   --  2.8  --   --    Liver Function Tests: Recent Labs  Lab 05/15/24 1155  AST 29  ALT 22  ALKPHOS 77  BILITOT 2.2*  PROT 7.3  ALBUMIN 2.7*   CBG: Recent Labs  Lab 05/20/24 0931 05/20/24 1131 05/20/24 1723 05/20/24 2052 05/21/24 0723  GLUCAP 235* 226* 172* 170* 141*    Discharge time spent: greater than 30 minutes.  Signed: Aimee Somerset, MD [facility] 05/21/2024

## 2024-05-21 NOTE — Plan of Care (Signed)
?  Problem: Education: ?Goal: Knowledge of General Education information will improve ?Description: Including pain rating scale, medication(s)/side effects and non-pharmacologic comfort measures ?Outcome: Progressing ?  ?Problem: Clinical Measurements: ?Goal: Ability to maintain clinical measurements within normal limits will improve ?Outcome: Progressing ?  ?Problem: Activity: ?Goal: Risk for activity intolerance will decrease ?Outcome: Progressing ?  ?Problem: Nutrition: ?Goal: Adequate nutrition will be maintained ?Outcome: Progressing ?  ?Problem: Elimination: ?Goal: Will not experience complications related to bowel motility ?Outcome: Progressing ?  ?Problem: Safety: ?Goal: Ability to remain free from injury will improve ?Outcome: Progressing ?  ?

## 2024-05-21 NOTE — TOC Transition Note (Signed)
 Transition of Care [facility]) - Discharge Note   Patient Details  Name: Clayton [name] MRN: [id number] Date of Birth: December 08, 1970  Transition of Care [facility]) CM/SW Contact:  Marinda Cooks, RN Phone Number: 05/21/2024, 3:32 PM   Clinical Narrative:    This CM updated by covering MD pt medically cleared to dc today and has active DC order . This CM spoke with [facility] @ [facility]  and confirmed pt's HH visit via charity to manage his wound vac .  DC transportation confirmed for pt with wife. Knee Rollator recommended by PT coordinated with [employer] and paid for out of pocket by wife and arranged for pick up at store front on 05/22/24 due to it being a special item and not able to be delivered to bedside. Pt's wound vac provided via charity with [employer] also delivered to bedside prior to dc. This CM updated by covering MD pt will receive out patient IV abx therapy .  Medical team updated  on all above info . No additional DC needs requested by medical team or identified by CM at this time .     Final next level of care: Home w Home Health Services Barriers to Discharge: Inadequate or no insurance   Name of family member notified: Pt and wife Patient and family notified of of transfer: 05/21/24  Discharge Plan and Services Additional resources added to the After Visit Summary for       Post Acute Care Choice: Home Health, Durable Medical Equipment          DME Arranged:  (Knee Rollator)         HH Arranged: RN (wound vac care) HH Agency: [facility] Date [facility] Agency Contacted: 05/21/24 Time HH Agency Contacted: 1532 Representative spoke with at [facility] Agency: Darleene  Social Drivers of Health (SDOH) Interventions SDOH Screenings   Food Insecurity: No Food Insecurity (05/15/2024)  Housing: Low Risk  (05/15/2024)  Transportation Needs: No Transportation Needs (05/15/2024)  Utilities: Not At Risk (05/15/2024)  Financial Resource Strain: Low Risk  (07/07/2023)   Received from [facility]  System  Tobacco Use: Low Risk  (05/17/2024)     Readmission Risk Interventions     No data to display

## 2024-05-21 NOTE — Plan of Care (Signed)
   Problem: Education: Goal: Ability to describe self-care measures that may prevent or decrease complications (Diabetes Survival Skills Education) will improve Outcome: Progressing

## 2024-05-21 NOTE — Progress Notes (Signed)
   Subjective: 4 Days Post-Op Procedure(s) (LRB): IRRIGATION AND DEBRIDEMENT FOOT (Left) GRAFT APPLICATION (Left) APPLICATION, WOUND VAC (Left) DOS: 05/17/2024.  Dr. Marolyn Honour D.P.M.  Objective: Vital signs in last 24 hours: Temp:  [98.1 F (36.7 C)-98.8 F (37.1 C)] 98.1 F (36.7 C) (08/03 0841) Pulse Rate:  [71-76] 73 (08/03 0841) Resp:  [16-18] 16 (08/03 0841) BP: (130-142)/(70-80) 136/80 (08/03 0841) SpO2:  [97 %-98 %] 97 % (08/03 0841) Weight:  [124.1 kg] 124.1 kg (08/03 0500)  Recent Labs    05/19/24 0739  HGB 7.2*   Recent Labs    05/19/24 0739  WBC 19.1*  RBC 2.46*  HCT 23.1*  PLT 432*   Recent Labs    05/19/24 0739  NA 132*  K 3.9  CL 100  CO2 23  BUN 30*  CREATININE 2.31*  GLUCOSE 236*  CALCIUM  7.9*   No results for input(s): LABPT, INR in the last 72 hours.  Aerobic/Anaerobic Culture w Gram Stain (surgical/deep wound) 05/15/2024 Specimen Description WOUND Performed at [facility], [address], [place], [place] [address]  Special Requests  LEFT FOOT CUBOID BONE Performed at [facility], [address] [place], [place] [address]  Gram Stain NO WBC SEEN RARE GRAM POSITIVE COCCI RARE GRAM NEGATIVE RODS  Culture FEW STREPTOCOCCUS MITIS/ORALIS FEW VIRIDANS STREPTOCOCCUS MODERATE PREVOTELLA MELANINOGENICA BETA LACTAMASE POSITIVE Performed at [facility], [address], [place], [place] [address]  Report Status 05/20/2024 FINAL  Organism ID, Bacteria STREPTOCOCCUS MITIS/ORALIS  Organism ID, Bacteria VIRIDANS STREPTOCOCCUS  Resulting Agency [facility]     Susceptibility   Streptococcus mitis/oralis Viridans streptococcus    MIC MIC    CEFTRIAXONE  <=0.12 SENS... Sensitive <=0.12 SENS... Sensitive    ERYTHROMYCIN   4 RESISTANT Resistant    LEVOFLOXACIN 1 SENSITIVE Sensitive <=0.25 SENS... Sensitive    PENICILLIN <=0.06 SENS... Sensitive <=0.06 SENS... Sensitive    VANCOMYCIN  0.5 SENSITIVE Sensitive  0.5 SENSITIVE Sensitive             Susceptibility Comments  Streptococcus mitis/oralis  FEW STREPTOCOCCUS MITIS/ORALIS  Viridans streptococcus  FEW VIRIDANS STREPTOCOCCUS     Physical Exam: Patient doing well.  Wound VAC and dressings in place without complication  Assessment/Plan: 4 Days Post-Op Procedure(s) (LRB): IRRIGATION AND DEBRIDEMENT FOOT (Left) GRAFT APPLICATION (Left) APPLICATION, WOUND VAC (Left) DOS: 05/17/2024.  Dr. Marolyn Honour  -Patient seen at bedside with wound VAC in place -[employer] and Medicaid form completed and signed and sent back to case management, Shanea Lining RN -Wound VAC changes 2x/week post discharge -Nonweightbearing postop shoe -ABX as per ID.  Greatly appreciated. 1500mg  Dalbavancin today and 2nd dose on 05/26/24 at day surgery ( short stay). Flagyl  500mg  PO BID x3.  Follow-up appointment with Dr. Fayette 06/01/2024 -Follow-up in podiatry office approximately 2 weeks postdischarge.  Our office will contact him for an appointment -Podiatry will sign off   Thresa CHRISTELLA Sar 05/21/2024, 10:34 AM  Thresa EMERSON Sar, DPM [facility]  Dr. Thresa EMERSON Sar, DPM    [address] [place], [place] [address]                Office [phone]  Fax [phone]

## 2024-05-21 NOTE — Progress Notes (Signed)
 Physical Therapy Treatment Patient Details Name: Rishik Tubby MRN: [id number] DOB: August 16, 1971 Today's Date: 05/21/2024   History of Present Illness Pt is a 53 y/o M admitted on 05/15/24 after presenting with c/o abdominal pain with intractable N&V as well as L foot ulceration with foul-smelling drainage after stepping on an object. Pt diagnosed with L foot acute osteomyelitis & underwent I&D with wound vac placement on 05/17/24. Pt also treated for DKA. PMH: DM, CKD IV, HTN, obesity    PT Comments  Pt in bed ready to get up to chair.  Transfers with RW and min a x 1.  He does well with NWB L with post op shoe.  Declined gait or step training today stating he was fine and would figure it out at home.  Pt has one small threshold step up into his home.  Enocuraged to [employer] chair just inside door, sit and spin feet inside then continue with gait.  He stated he also has an option for a ramp and is urged to use caution with knee scooter if he tries to go up/down ramp with scooter and have assist for safety.  Returned later in AM but he continued to decline gait.     If plan is discharge home, recommend the following: A little help with walking and/or transfers;A little help with bathing/dressing/bathroom;Assistance with cooking/housework;Assist for transportation;Help with stairs or ramp for entrance   Can travel by private vehicle        Equipment Recommendations  Rolling walker (2 wheels) (knee scooter)    Recommendations for Other Services       Precautions / Restrictions Precautions Precautions: Fall Recall of Precautions/Restrictions: Intact Required Braces or Orthoses: Other Brace (post op shoe) Other Brace: RLE post op shoe Restrictions Weight Bearing Restrictions Per Provider Order: Yes LLE Weight Bearing Per Provider Order: Non weight bearing     Mobility  Bed Mobility Overal bed mobility: Modified Independent               Patient Response:  Cooperative  Transfers Overall transfer level: Needs assistance Equipment used: Rolling walker (2 wheels) Transfers: Sit to/from Stand Sit to Stand: Min assist           General transfer comment: asks for min a to stand    Ambulation/Gait                   Stairs             Wheelchair Mobility     Tilt Bed Tilt Bed Patient Response: Cooperative  Modified Rankin (Stroke Patients Only)       Balance Overall balance assessment: Needs assistance Sitting-balance support: Feet supported Sitting balance-Leahy Scale: Good     Standing balance support: During functional activity, Bilateral upper extremity supported, Single extremity supported Standing balance-Leahy Scale: Good                              Communication Communication Communication: No apparent difficulties  Cognition Arousal: Alert Behavior During Therapy: WFL for tasks assessed/performed   PT - Cognitive impairments: No apparent impairments                       PT - Cognition Comments: Pt is A and O x 4 Following commands: Intact      Cueing Cueing Techniques: Verbal cues, Tactile cues  Exercises      General Comments  Pertinent Vitals/Pain Pain Assessment Pain Assessment: Faces Faces Pain Scale: Hurts even more Pain Location: L foot Pain Descriptors / Indicators: Discomfort, Grimacing Pain Intervention(s): Limited activity within patient's tolerance, Monitored during session, Repositioned    Home Living                          Prior Function            PT Goals (current goals can now be found in the care plan section) Progress towards PT goals: Progressing toward goals    Frequency    7X/week      PT Plan      Co-evaluation              AM-PAC PT 6 Clicks Mobility   Outcome Measure  Help needed turning from your back to your side while in a flat bed without using bedrails?: None Help needed moving from  lying on your back to sitting on the side of a flat bed without using bedrails?: None Help needed moving to and from a bed to a chair (including a wheelchair)?: None Help needed standing up from a chair using your arms (e.g., wheelchair or bedside chair)?: A Little Help needed to walk in hospital room?: A Little Help needed climbing 3-5 steps with a railing? : A Little 6 Click Score: 21    End of Session   Activity Tolerance: Patient tolerated treatment well Patient left: in chair;with call bell/phone within reach;with chair alarm set Nurse Communication: Mobility status PT Visit Diagnosis: Unsteadiness on feet (R26.81);Other abnormalities of gait and mobility (R26.89);Muscle weakness (generalized) (M62.81);Difficulty in walking, not elsewhere classified (R26.2)     Time: 9169-9159 PT Time Calculation (min) (ACUTE ONLY): 10 min  Charges:    $Gait Training: 8-22 mins PT General Charges $$ ACUTE PT VISIT: 1 Visit                   Lauraine Gills, PTA 05/21/24, 11:29 AM

## 2024-05-21 NOTE — Progress Notes (Signed)
 Reviewed discharge instructions with patient and patient's wife. Patient and patient's wife acknowledged understanding. Patient discharged with personal belongings. Patient wheeled out by staff. Patient transported home via family vehicle. No distress noted in patient.

## 2024-05-22 ENCOUNTER — Telehealth: Payer: Self-pay

## 2024-05-22 LAB — PARATHYROID HORMONE, INTACT (NO CA): PTH: 36 pg/mL (ref 15–65)

## 2024-05-22 MED ORDER — DEXTROSE 5 % IV SOLN: 1500.0000 mg | Freq: Once | INTRAVENOUS | Status: DC

## 2024-05-22 NOTE — Telephone Encounter (Signed)
 Tonya with [employer] needs verbal orders for wound vac twice a week. She also stated the patient was discharged from the hospital with no pain meds.

## 2024-05-22 NOTE — Telephone Encounter (Signed)
 Verbal orders given to Tonya, Charity fundraiser with [employer]. [facility] also reports patient complaining of 7/10 pain level while she was on site. Patient also reports a 10/10 pain level at worst. No pain medication was given at discharge per patient.

## 2024-05-23 NOTE — Anesthesia Postprocedure Evaluation (Signed)
 Anesthesia Post Note  Patient: Avier Jech  Procedure(s) Performed: IRRIGATION AND DEBRIDEMENT FOOT (Left: Foot) GRAFT APPLICATION (Left: Foot) APPLICATION, WOUND VAC (Left: Foot)  Patient location during evaluation: PACU Anesthesia Type: General Level of consciousness: awake and alert Pain management: pain level controlled Vital Signs Assessment: post-procedure vital signs reviewed and stable Respiratory status: spontaneous breathing, nonlabored ventilation, respiratory function stable and patient connected to nasal cannula oxygen Cardiovascular status: blood pressure returned to baseline and stable Postop Assessment: no apparent nausea or vomiting Anesthetic complications: no   There were no known notable events for this encounter.   Last Vitals:  Vitals:   05/21/24 0841 05/21/24 1520  BP: 136/80 139/74  Pulse: 73 75  Resp: 16 16  Temp: 36.7 C 36.9 C  SpO2: 97% 98%    Last Pain:  Vitals:   05/21/24 1843  TempSrc:   PainSc: 7                  Lynwood KANDICE Clause

## 2024-05-25 NOTE — Progress Notes (Signed)
 Telemedicine video encounter documentation  This video encounter was conducted with the patient's (or proxy's) verbal consent via secure, interactive audio and video telecommunications while in clinic/office/hospital.  The patient (or proxy) was instructed to have this encounter in a suitably private space and to only have persons present to whom they give permission to participate. In addition, patient identity was confirmed by use of name plus two identifiers.   Chief Complaint:   Chief Complaint  Patient presents with  . Hospital Follow Up    Subjective:   Clayton [name] is a 53 y.o. male established patient. Video visit today for:  History of Present Illness Clayton [name] is a 53 year old male with a diabetic foot ulcer who presents for follow-up after hospital discharge and FMLA paperwork.  Diabetic foot ulcer and osteomyelitis - Hospitalized from July 28 to May 21, 2024, at [facility] for a severe diabetic foot ulcer with associated osteomyelitis of the left foot. - Received irrigation and cleaning of the infected area by podiatry during hospitalization. - Currently on a wound vac at home. - Unable to bear weight on the left foot; requires two to four weeks off the foot. - Receiving home health services, including wound care and physical therapy from [facility].  Diabetic ketoacidosis and glycemic control - Diagnosed with diabetic ketoacidosis during recent hospitalization. - Hemoglobin A1c was 11.1, indicating uncontrolled diabetes. - Current insulin  regimen: 20 units twice daily.  Antibiotic and adjunctive therapy - Started on IV antibiotics during hospitalization; continues to receive weekly IV antibiotics for two weeks. - Prescribed oral metronidazole  for three weeks. - Current medications include pantoprazole , rosuvastatin , vitamin D  once weekly, zinc  once daily, amlodipine  10 mg daily, and carvedilol  twice daily.  Renal function concerns -  Losartan  held due to concerns about kidney function. - Scheduled to see a nephrologist for further evaluation.  Functional status and occupational limitations - Works as an Airline pilot. - Currently unable to work due to inability to bear weight on the left foot.    Patient Active Problem List  Diagnosis  . Diabetic retinopathy of both eyes associated with DM (CMS-HCC)  . History of smoking  . Type 2 diabetes mellitus with stage 3a chronic kidney disease (CMS/HHS-HCC)  . Normocytic anemia  . Low vitamin B12 level  . Vitamin D  deficiency  . Hypertensive heart and kidney disease  . Erectile dysfunction  . Excessive daytime sleepiness  . Snoring  . Secondary hyperparathyroidism of renal origin (CMS/HHS-HCC)  . Type 2 diabetes mellitus with proteinuria  (CMS/HHS-HCC)  . Essential hypertension  . Seizure (CMS/HHS-HCC)  . Dizziness  . Seizure-like activity (CMS/HHS-HCC)    Outpatient Medications Prior to Visit  Medication Sig Dispense Refill  . blood-glucose meter,continuous (FREESTYLE LIBRE 3 READER) Misc Use 1 each as directed 1 each 0  . blood-glucose sensor (FREESTYLE LIBRE 3 SENSOR) Use 1 each every 14 (fourteen) days 2 each 11  . carvediloL  (COREG ) 25 MG tablet Take 1 tablet (25 mg total) by mouth 2 (two) times daily with meals 60 tablet 2  . ergocalciferol , vitamin D2, 1,250 mcg (50,000 unit) capsule Take 50,000 Units by mouth once a week    . HUMALOG  MIX 75-25 KWIKPEN 100 unit/mL (75-25) pen injector Inject 20 Units subcutaneously 2 (two) times daily    . loratadine  (CLARITIN ) 10 mg tablet Take 10 mg by mouth once daily    . metroNIDAZOLE  (FLAGYL ) 500 MG tablet Take 500 mg by mouth 2 (two) times  daily    . pantoprazole  (PROTONIX ) 40 MG DR tablet Take 40 mg by mouth once daily    . rosuvastatin  (CRESTOR ) 20 MG tablet TAKE 1 TABLET(20 MG) BY MOUTH DAILY 30 tablet 5  . zinc  sulfate 50 mg zinc  (220 mg) Tab Take 1 tablet by mouth once daily    .  cholecalciferol  (VITAMIN D3) 2,000 unit tablet Take 2,000 Units by mouth once daily       . HUMALOG  MIX 75-25 KWIKPEN 100 unit/mL (75-25) pen injector Inject 15 Units subcutaneously 2 (two) times daily 9 mL 2  . sildenafiL (VIAGRA) 100 MG tablet Take 1 tablet (100 mg total) by mouth once daily as needed for Erectile Dysfunction Take 1 hour before sexual activity. 5 tablet 11  . amLODIPine  (NORVASC ) 10 MG tablet TAKE 1 TABLET(10 MG) BY MOUTH DAILY 100 tablet 0  . chlorthalidone  25 MG tablet TAKE 1 TABLET(25 MG) BY MOUTH DAILY 100 tablet 0  . empagliflozin (JARDIANCE) 25 mg tablet Take 1 tablet (25 mg total) by mouth once daily 30 tablet 2  . metFORMIN  (GLUCOPHAGE -XR) 500 MG XR tablet TAKE 2 TABLETS(1000 MG) BY MOUTH TWICE DAILY 400 tablet 0  . olmesartan (BENICAR) 40 MG tablet TAKE 1 TABLET(40 MG) BY MOUTH DAILY 30 tablet 0   No facility-administered medications prior to visit.     ROS   Objective:   Vitals as reported by patient: Vitals:   05/25/24 1525  Weight: (!) 112.5 kg (248 lb)  Height: 182.9 cm (6')  PainSc:   5  PainLoc: Foot   Body mass index is 33.63 kg/m.  Constitutional: alert, interactive with provider, cooperative, in no distress Mental status: oriented x 3, good historian, appropriate mood and behavior, thought content appears normal Respiratory: no respiratory distress, no audible wheezing   Assessment/Plan:   Assessment & Plan Diabetic foot ulcer with osteomyelitis, left foot in the setting of poorly controlled type 2 diabetes mellitus Recently hospitalized for diabetic foot ulcer with osteomyelitis. Hemoglobin A1c at 11.1. Treated with irrigation, cleaning, and IV antibiotics. Currently on weekly IV antibiotics and oral metronidazole . Receiving home health care and physical therapy. Advised to avoid pressure on the left foot. Discussed return to work around October 27th. - Continue IV antibiotics weekly for two weeks. - Continue oral metronidazole  for three  weeks. - Maintain home health care and physical therapy, including wound care. - Advise no pressure on the left foot for 2-4 weeks. - Discuss return to work with infectious disease provider and foot specialist. - Complete FMLA paperwork for leave until October 27th.  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 Losartan  held due to kidney function concerns. Scheduled to see a kidney specialist. - Hold losartan  due to kidney function concerns. - Schedule appointment with kidney specialist.  Essential hypertension On amlodipine  10 mg daily and carvedilol  twice daily. - Continue amlodipine  10 mg daily. - Continue carvedilol  twice daily.   Diagnosis and all orders for this visit: 1. Hospital discharge follow-up   2. Diabetic ulcer of left foot associated with type 2 diabetes mellitus, with fat layer exposed, unspecified part of foot (CMS/HHS-HCC)   3. Osteomyelitis of left foot, unspecified type (CMS/HHS-HCC)   4. Stage 3b chronic kidney disease (CMS/HHS-HCC)   5. Hypertensive heart and renal disease with renal failure, stage 1 through stage 4 or unspecified chronic kidney disease, without heart failure   6. Type 2 diabetes mellitus with stage 3b chronic kidney disease, with long-term current  use of insulin  (CMS/HHS-HCC)   7. History of diabetic ketoacidosis   8. History of sepsis   9. Encounter for completion of form with patient      Medications Discontinued During This Encounter  Medication Reason  . metFORMIN  (GLUCOPHAGE -XR) 500 MG XR tablet   . chlorthalidone  25 MG tablet   . empagliflozin (JARDIANCE) 25 mg tablet   . olmesartan (BENICAR) 40 MG tablet   . HUMALOG  MIX 75-25 KWIKPEN 100 unit/mL (75-25) pen injector   . cholecalciferol  (VITAMIN D3) 2,000 unit tablet   . sildenafiL (VIAGRA) 100 MG tablet        I spent a total of in both face-to-face and non-face-to-face activities, excluding procedures  performed, for this visit on the date of this encounter.             Future Appointments   This patient does not currently have any appointments scheduled.     There are no Patient Instructions on file for this visit.   An after visit summary was provided for the patient either in written format (printed) or through MyChart.  This note has been created using automated tools and reviewed for accuracy by [employer].

## 2024-05-26 ENCOUNTER — Ambulatory Visit
Admission: RE | Admit: 2024-05-26 | Discharge: 2024-05-26 | Disposition: A | Discharge status: Home / Self Care | Payer: MEDICAID | Source: Ambulatory Visit | Attending: Infectious Diseases | Admitting: Infectious Diseases

## 2024-05-26 VITALS — BP 164/89 | HR 64 | Temp 98.7°F | Resp 14

## 2024-05-26 DIAGNOSIS — E11621 Type 2 diabetes mellitus with foot ulcer: Secondary | ICD-10-CM | POA: Insufficient documentation

## 2024-05-26 DIAGNOSIS — L97522 Non-pressure chronic ulcer of other part of left foot with fat layer exposed: Secondary | ICD-10-CM | POA: Insufficient documentation

## 2024-05-26 DIAGNOSIS — L97524 Non-pressure chronic ulcer of other part of left foot with necrosis of bone: Secondary | ICD-10-CM | POA: Insufficient documentation

## 2024-05-26 DIAGNOSIS — M869 Osteomyelitis, unspecified: Secondary | ICD-10-CM

## 2024-05-26 LAB — COMPREHENSIVE METABOLIC PANEL WITH GFR
ALT: 39 U/L (ref 0–44)
AST: 43 U/L — ABNORMAL HIGH (ref 15–41)
Albumin: 2.4 g/dL — ABNORMAL LOW (ref 3.5–5.0)
Alkaline Phosphatase: 67 U/L (ref 38–126)
Anion gap: 9 (ref 5–15)
BUN: 21 mg/dL — ABNORMAL HIGH (ref 6–20)
CO2: 25 mmol/L (ref 22–32)
Calcium: 8.7 mg/dL — ABNORMAL LOW (ref 8.9–10.3)
Chloride: 103 mmol/L (ref 98–111)
Creatinine, Ser: 2.29 mg/dL — ABNORMAL HIGH (ref 0.61–1.24)
GFR, Estimated: 34 mL/min — ABNORMAL LOW (ref >60)
Glucose, Bld: 194 mg/dL — ABNORMAL HIGH (ref 70–99)
Potassium: 4.4 mmol/L (ref 3.5–5.1)
Sodium: 137 mmol/L (ref 135–145)
Total Bilirubin: 0.8 mg/dL (ref 0.0–1.2)
Total Protein: 6.7 g/dL (ref 6.5–8.1)

## 2024-05-26 LAB — SEDIMENTATION RATE: Sed Rate: 127 mm/h — ABNORMAL HIGH (ref 0–20)

## 2024-05-26 LAB — CBC WITH DIFFERENTIAL/PLATELET
Abs Immature Granulocytes: 0.18 K/uL — ABNORMAL HIGH (ref 0.00–0.07)
Basophils Absolute: 0 K/uL (ref 0.0–0.1)
Basophils Relative: 0 %
Eosinophils Absolute: 0.2 K/uL (ref 0.0–0.5)
Eosinophils Relative: 2 %
HCT: 20.6 % — ABNORMAL LOW (ref 39.0–52.0)
Hemoglobin: 6.2 g/dL — ABNORMAL LOW (ref 13.0–17.0)
Immature Granulocytes: 2 %
Lymphocytes Relative: 13 %
Lymphs Abs: 1.3 K/uL (ref 0.7–4.0)
MCH: 30.2 pg (ref 26.0–34.0)
MCHC: 30.1 g/dL (ref 30.0–36.0)
MCV: 100.5 fL — ABNORMAL HIGH (ref 80.0–100.0)
Monocytes Absolute: 0.5 K/uL (ref 0.1–1.0)
Monocytes Relative: 5 %
Neutro Abs: 8 K/uL — ABNORMAL HIGH (ref 1.7–7.7)
Neutrophils Relative %: 78 %
Platelets: 436 K/uL — ABNORMAL HIGH (ref 150–400)
RBC: 2.05 MIL/uL — ABNORMAL LOW (ref 4.22–5.81)
RDW: 16 % — ABNORMAL HIGH (ref 11.5–15.5)
WBC: 10.2 K/uL (ref 4.0–10.5)
nRBC: 0 % (ref 0.0–0.2)

## 2024-05-26 LAB — C-REACTIVE PROTEIN: CRP: 1.2 mg/dL — ABNORMAL HIGH (ref <1.0)

## 2024-05-26 MED ORDER — DEXTROSE 5 % IV SOLN
1500.0000 mg | Freq: Once | INTRAVENOUS | Status: AC
  Administered 2024-05-26: 1500 mg via INTRAVENOUS
  Filled 2024-05-26: qty 75

## 2024-06-01 ENCOUNTER — Other Ambulatory Visit: Payer: Self-pay

## 2024-06-01 ENCOUNTER — Ambulatory Visit: Payer: MEDICAID | Attending: Infectious Diseases | Admitting: Infectious Diseases

## 2024-06-01 ENCOUNTER — Ambulatory Visit
Admission: RE | Admit: 2024-06-01 | Discharge: 2024-06-01 | Disposition: A | Discharge status: Home / Self Care | Payer: MEDICAID | Source: Ambulatory Visit | Attending: Infectious Diseases | Admitting: Infectious Diseases

## 2024-06-01 ENCOUNTER — Encounter: Payer: Self-pay | Admitting: Infectious Diseases

## 2024-06-01 VITALS — BP 194/98 | HR 71 | Temp 98.4°F

## 2024-06-01 DIAGNOSIS — B9561 Methicillin susceptible Staphylococcus aureus infection as the cause of diseases classified elsewhere: Secondary | ICD-10-CM | POA: Insufficient documentation

## 2024-06-01 DIAGNOSIS — Z5971 Insufficient health insurance coverage: Secondary | ICD-10-CM | POA: Insufficient documentation

## 2024-06-01 DIAGNOSIS — E1169 Type 2 diabetes mellitus with other specified complication: Secondary | ICD-10-CM | POA: Insufficient documentation

## 2024-06-01 DIAGNOSIS — E669 Obesity, unspecified: Secondary | ICD-10-CM | POA: Insufficient documentation

## 2024-06-01 DIAGNOSIS — E1142 Type 2 diabetes mellitus with diabetic polyneuropathy: Secondary | ICD-10-CM | POA: Insufficient documentation

## 2024-06-01 DIAGNOSIS — Z79899 Other long term (current) drug therapy: Secondary | ICD-10-CM | POA: Insufficient documentation

## 2024-06-01 DIAGNOSIS — E1122 Type 2 diabetes mellitus with diabetic chronic kidney disease: Secondary | ICD-10-CM

## 2024-06-01 DIAGNOSIS — L089 Local infection of the skin and subcutaneous tissue, unspecified: Secondary | ICD-10-CM | POA: Insufficient documentation

## 2024-06-01 DIAGNOSIS — M86172 Other acute osteomyelitis, left ankle and foot: Secondary | ICD-10-CM

## 2024-06-01 DIAGNOSIS — I129 Hypertensive chronic kidney disease with stage 1 through stage 4 chronic kidney disease, or unspecified chronic kidney disease: Secondary | ICD-10-CM | POA: Insufficient documentation

## 2024-06-01 DIAGNOSIS — D631 Anemia in chronic kidney disease: Secondary | ICD-10-CM | POA: Insufficient documentation

## 2024-06-01 DIAGNOSIS — D649 Anemia, unspecified: Secondary | ICD-10-CM

## 2024-06-01 DIAGNOSIS — B954 Other streptococcus as the cause of diseases classified elsewhere: Secondary | ICD-10-CM

## 2024-06-01 DIAGNOSIS — B957 Other staphylococcus as the cause of diseases classified elsewhere: Secondary | ICD-10-CM

## 2024-06-01 DIAGNOSIS — N183 Chronic kidney disease, stage 3 unspecified: Secondary | ICD-10-CM | POA: Insufficient documentation

## 2024-06-01 DIAGNOSIS — Z794 Long term (current) use of insulin: Secondary | ICD-10-CM

## 2024-06-01 DIAGNOSIS — E1151 Type 2 diabetes mellitus with diabetic peripheral angiopathy without gangrene: Secondary | ICD-10-CM

## 2024-06-01 DIAGNOSIS — N189 Chronic kidney disease, unspecified: Secondary | ICD-10-CM

## 2024-06-01 DIAGNOSIS — E11628 Type 2 diabetes mellitus with other skin complications: Secondary | ICD-10-CM

## 2024-06-01 MED ORDER — CEFADROXIL 500 MG PO CAPS
500.0000 mg | ORAL_CAPSULE | Freq: Two times a day (BID) | ORAL | 0 refills | Status: DC
  Filled 2024-06-01: qty 60, 30d supply, fill #0

## 2024-06-01 NOTE — Progress Notes (Signed)
 NAME: Clayton [name]  DOB: 1971/01/08  MRN: [id number]  Date/Time: 06/01/2024 9:46 AM   Subjective:  Patient is here as follow-up after recent hospitalization for left foot infection He was at [facility] 05/15/2024 until 05/21/2024  Clayton [name] is a 53 y.o. with a history of hypertension, diabetes, CKD, obesity presented to the ED On 05/07/2024 with abdominal pain nausea and vomiting and found to be in DKA.  Also a wound was seen on his left foot which started as a blister 2 weeks prior but it was infected and necrotic.  X-ray question osteomyelitis and patient underwent debridement on 05/15/2024 and the wound culture was Staphylococcus aureus, Streptococcus mitis and moderate Prevotella melena [name] which was beta-lactamase positive.  The blood culture was negative.  The bone pathology from the left cuboid was acute osteomyelitis. As patient did not have insurance he did not want to go on IV antibiotics home.  So he was given long-acting dalbavancin on 05/19/2024 and then 05/26/2024.  He is also on Flagyl  500 mg p.o. twice daily.  He is here for follow-up He says he is doing better He has a wound VAC.  He has an appointment to see the podiatrist on 06/06/2024. He has no fever or chills He does not weight-bear He has a knee scooter   Past Medical History:  Diagnosis Date   Asthma    CKD (chronic kidney disease), stage III (HCC)    Hypertension    Type II diabetes mellitus with renal manifestations [facility])     Past Surgical History:  Procedure Laterality Date   APPLICATION OF WOUND VAC Left 05/15/2024   Procedure: APPLICATION, WOUND VAC;  Surgeon: Malvin Marsa FALCON, DPM;  Location: [facility] ORS;  Service: Orthopedics/Podiatry;  Laterality: Left;   APPLICATION OF WOUND VAC Left 05/17/2024   Procedure: APPLICATION, WOUND VAC;  Surgeon: Malvin Marsa FALCON, DPM;  Location: [facility] ORS;  Service: Orthopedics/Podiatry;  Laterality: Left;   CATARACT EXTRACTION     GRAFT APPLICATION Left 05/17/2024    Procedure: GRAFT APPLICATION;  Surgeon: Malvin Marsa FALCON, DPM;  Location: [facility] ORS;  Service: Orthopedics/Podiatry;  Laterality: Left;   IRRIGATION AND DEBRIDEMENT FOOT Left 05/15/2024   Procedure: IRRIGATION AND DEBRIDEMENT FOOT;  Surgeon: Malvin Marsa FALCON, DPM;  Location: [facility] ORS;  Service: Orthopedics/Podiatry;  Laterality: Left;  WITH BONE BIOPSY   IRRIGATION AND DEBRIDEMENT FOOT Left 05/17/2024   Procedure: IRRIGATION AND DEBRIDEMENT FOOT;  Surgeon: Malvin Marsa FALCON, DPM;  Location: [facility] ORS;  Service: Orthopedics/Podiatry;  Laterality: Left;    Social History   Socioeconomic History   Marital status: Married    Spouse name: Not on file   Number of children: Not on file   Years of education: Not on file   Highest education level: Not on file  Occupational History   Not on file  Tobacco Use   Smoking status: Never   Smokeless tobacco: Never  Substance and Sexual Activity   Alcohol use: Yes   Drug use: Not Currently   Sexual activity: Yes  Other Topics Concern   Not on file  Social History Narrative   Not on file   Social Drivers of Health   Financial Resource Strain: Low Risk  (07/07/2023)   Received from [facility]   Overall Financial Resource Strain (CARDIA)    Difficulty of Paying Living Expenses: Not very hard  Food Insecurity: No Food Insecurity (05/15/2024)   Hunger Vital Sign    Worried About Running Out of Food in the Last Year:  Never true    Ran Out of Food in the Last Year: Never true  Transportation Needs: No Transportation Needs (05/15/2024)   PRAPARE - Administrator, Civil Service (Medical): No    Lack of Transportation (Non-Medical): No  Physical Activity: Not on file  Stress: Not on file  Social Connections: Not on file  Intimate Partner Violence: Not At Risk (05/15/2024)   Humiliation, Afraid, Rape, and Kick questionnaire    Fear of Current or Ex-Partner: No    Emotionally Abused: No    Physically  Abused: No    Sexually Abused: No    Family History  Problem Relation Age of Onset   Stroke Mother    Hypertension Mother    Hypertension Father    No Known Allergies I? Current Outpatient Medications  Medication Sig Dispense Refill   albuterol  (VENTOLIN  HFA) 108 (90 Base) MCG/ACT inhaler Inhale 2 puffs into the lungs every 6 (six) hours as needed for wheezing or shortness of breath. 8 g 2   amLODipine  (NORVASC ) 10 MG tablet Take 1 tablet (10 mg total) by mouth daily. 90 tablet 0   carvedilol  (COREG ) 25 MG tablet Take 1 tablet (25 mg total) by mouth 2 (two) times daily. 180 tablet 0   Continuous Glucose Sensor (FREESTYLE LIBRE 3 SENSOR) MISC :1 Each Every 2 Weeks     Insulin  Lispro Prot & Lispro (HUMALOG  75/25 MIX) (75-25) 100 UNIT/ML Kwikpen Inject 20 Units into the skin 2 (two) times daily. 36 mL 0   Insulin  Pen Needle (INSUPEN PEN NEEDLES) 31G X 5 MM MISC Use 2 times daily with Humalog  Mix Kwikpen 100 each 0   loratadine  (CLARITIN ) 10 MG tablet Take 1 tablet (10 mg total) by mouth at bedtime for 10 days. 10 tablet 0   losartan  (COZAAR ) 100 MG tablet Take 1 tablet (100 mg total) by mouth daily. 90 tablet 0   metroNIDAZOLE  (FLAGYL ) 500 MG tablet Take 1 tablet (500 mg total) by mouth 2 (two) times daily for 21 days. 42 tablet 0   pantoprazole  (PROTONIX ) 40 MG tablet Take 1 tablet (40 mg total) by mouth daily. 30 tablet 0   rosuvastatin  (CRESTOR ) 20 MG tablet Take 1 tablet (20 mg total) by mouth daily. 30 tablet 0   Vitamin D , Ergocalciferol , (DRISDOL) 1.25 MG (50000 UNIT) CAPS capsule Take 50,000 Units by mouth once a week.     Zinc  Sulfate 220 (50 Zn) MG TABS Take 1 tablet (220 mg total) by mouth daily. 30 tablet 0   No current facility-administered medications for this visit.     Abtx:  Anti-infectives (From admission, onward)    None       REVIEW OF SYSTEMS:  Const: negative fever, negative chills, negative weight loss Eyes: negative diplopia or visual changes, negative eye  pain ENT: negative coryza, negative sore throat Resp: negative cough, hemoptysis, dyspnea Cards: negative for chest pain, palpitations, lower extremity edema GU: negative for frequency, dysuria and hematuria GI: Negative for abdominal pain, diarrhea, bleeding, constipation Skin: negative for rash and pruritus Heme: negative for easy bruising and gum/nose bleeding MS: As above Neurolo:negative for headaches, dizziness, vertigo, memory problems  Psych: negative for feelings of anxiety, depression  Endocrine:  diabetes Allergy/Immunology- negative for any medication or food allergies ? Objective:  VITALS:  BP (!) 194/98   Pulse 71   Temp 98.4 F (36.9 C) (Temporal)   SpO2 96%   PHYSICAL EXAM:  General: Alert, cooperative, no distress, appears stated age.  Head: Normocephalic, without obvious abnormality, atraumatic. Eyes: Conjunctivae clear, anicteric sclerae. Pupils are equal ENT Nares normal. No drainage or sinus tenderness. Lips, mucosa, and tongue normal. No Thrush Neck: Supple, symmetrical, no adenopathy, thyroid: non tender no carotid bruit and no JVD. Back: No CVA tenderness. Lungs: Clear to auscultation bilaterally. No Wheezing or Rhonchi. No rales. Heart: Regular rate and rhythm, no murmur, rub or gallop. Abdomen: Did not examine  extremities: Left foot the ulcer is covered with a wound VAC     Skin: No rashes or lesions. Or bruising Lymph: Cervical, supraclavicular normal. Neurologic: Grossly non-focal Pertinent Labs Lab Results CBC    Component Value Date/Time   WBC 10.2 05/26/2024 0844   RBC 2.05 (L) 05/26/2024 0844   HGB 6.2 (L) 05/26/2024 0844   HCT 20.6 (L) 05/26/2024 0844   PLT 436 (H) 05/26/2024 0844   MCV 100.5 (H) 05/26/2024 0844   MCH 30.2 05/26/2024 0844   MCHC 30.1 05/26/2024 0844   RDW 16.0 (H) 05/26/2024 0844   LYMPHSABS 1.3 05/26/2024 0844   MONOABS 0.5 05/26/2024 0844   EOSABS 0.2 05/26/2024 0844   BASOSABS 0.0 05/26/2024 0844        Latest Ref Rng & Units 05/26/2024    8:44 AM 05/19/2024    7:39 AM 05/18/2024   10:08 AM  CMP  Glucose 70 - 99 mg/dL 805  763  787   BUN 6 - 20 mg/dL 21  30  31    Creatinine 0.61 - 1.24 mg/dL 7.70  7.68  7.32   Sodium 135 - 145 mmol/L 137  132  131   Potassium 3.5 - 5.1 mmol/L 4.4  3.9  3.9   Chloride 98 - 111 mmol/L 103  100  101   CO2 22 - 32 mmol/L 25  23  22    Calcium  8.9 - 10.3 mg/dL 8.7  7.9  8.0   Total Protein 6.5 - 8.1 g/dL 6.7     Total Bilirubin 0.0 - 1.2 mg/dL 0.8     Alkaline Phos 38 - 126 U/L 67     AST 15 - 41 U/L 43     ALT 0 - 44 U/L 39          ? 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   He is also on metronidazole  500 mg p.o. twice daily We will start him on cefadroxil  500 mg p.o. twice daily from Saturday 8/16 He will need for at least a month.  CKD  Diabetes mellitus  Anemia His hemoglobin from 8 8 was 6.2 I called the patient and let him know of this result.  He is asymptomatic.  And asked him to go for repeat labs tomorrow.  He is in the process of changing his PCP.  ________________________________________________ Discussed with patient in detail Follow-up in 4weeks He has an appointment with podiatrist on 06/06/2024.  Will discuss with them.

## 2024-06-01 NOTE — Patient Instructions (Addendum)
 You are here for follow up of left foot infeciton- You received 2 doses of dalbavancin and you are on flagyl - staring Saturday you can take cefadroxil  500mg  pill twice a day and continue flagyl  500mg  twice a day- follow up with Dr.Standiford- labs next week

## 2024-06-02 ENCOUNTER — Other Ambulatory Visit
Admission: RE | Admit: 2024-06-02 | Discharge: 2024-06-02 | Disposition: A | Discharge status: Home / Self Care | Payer: MEDICAID | Attending: Infectious Diseases | Admitting: Infectious Diseases

## 2024-06-02 DIAGNOSIS — L089 Local infection of the skin and subcutaneous tissue, unspecified: Secondary | ICD-10-CM | POA: Insufficient documentation

## 2024-06-02 DIAGNOSIS — E11628 Type 2 diabetes mellitus with other skin complications: Secondary | ICD-10-CM | POA: Insufficient documentation

## 2024-06-02 LAB — COMPREHENSIVE METABOLIC PANEL WITH GFR
ALT: 26 U/L (ref 0–44)
AST: 26 U/L (ref 15–41)
Albumin: 3.1 g/dL — ABNORMAL LOW (ref 3.5–5.0)
Alkaline Phosphatase: 77 U/L (ref 38–126)
Anion gap: 12 (ref 5–15)
BUN: 18 mg/dL (ref 6–20)
CO2: 23 mmol/L (ref 22–32)
Calcium: 9.2 mg/dL (ref 8.9–10.3)
Chloride: 103 mmol/L (ref 98–111)
Creatinine, Ser: 2.41 mg/dL — ABNORMAL HIGH (ref 0.61–1.24)
GFR, Estimated: 32 mL/min — ABNORMAL LOW (ref >60)
Glucose, Bld: 175 mg/dL — ABNORMAL HIGH (ref 70–99)
Potassium: 4.1 mmol/L (ref 3.5–5.1)
Sodium: 138 mmol/L (ref 135–145)
Total Bilirubin: 0.8 mg/dL (ref 0.0–1.2)
Total Protein: 7.4 g/dL (ref 6.5–8.1)

## 2024-06-02 LAB — CBC WITH DIFFERENTIAL/PLATELET
Abs Immature Granulocytes: 0.02 K/uL (ref 0.00–0.07)
Basophils Absolute: 0.1 K/uL (ref 0.0–0.1)
Basophils Relative: 1 %
Eosinophils Absolute: 0.2 K/uL (ref 0.0–0.5)
Eosinophils Relative: 2 %
HCT: 28.7 % — ABNORMAL LOW (ref 39.0–52.0)
Hemoglobin: 9.1 g/dL — ABNORMAL LOW (ref 13.0–17.0)
Immature Granulocytes: 0 %
Lymphocytes Relative: 14 %
Lymphs Abs: 1.1 K/uL (ref 0.7–4.0)
MCH: 31.4 pg (ref 26.0–34.0)
MCHC: 31.7 g/dL (ref 30.0–36.0)
MCV: 99 fL (ref 80.0–100.0)
Monocytes Absolute: 0.4 K/uL (ref 0.1–1.0)
Monocytes Relative: 5 %
Neutro Abs: 5.9 K/uL (ref 1.7–7.7)
Neutrophils Relative %: 78 %
Platelets: 361 K/uL (ref 150–400)
RBC: 2.9 MIL/uL — ABNORMAL LOW (ref 4.22–5.81)
RDW: 16.7 % — ABNORMAL HIGH (ref 11.5–15.5)
WBC: 7.6 K/uL (ref 4.0–10.5)
nRBC: 0 % (ref 0.0–0.2)

## 2024-06-02 LAB — SEDIMENTATION RATE: Sed Rate: 84 mm/h — ABNORMAL HIGH (ref 0–20)

## 2024-06-02 LAB — C-REACTIVE PROTEIN: CRP: <0.5 mg/dL (ref <1.0)

## 2024-06-06 ENCOUNTER — Encounter: Payer: Self-pay | Admitting: Podiatry

## 2024-06-06 ENCOUNTER — Ambulatory Visit (INDEPENDENT_AMBULATORY_CARE_PROVIDER_SITE_OTHER): Payer: Self-pay | Admitting: Podiatry

## 2024-06-06 ENCOUNTER — Other Ambulatory Visit: Payer: Self-pay

## 2024-06-06 ENCOUNTER — Telehealth: Payer: Self-pay | Admitting: Lab

## 2024-06-06 VITALS — Ht 71.0 in | Wt 273.5 lb

## 2024-06-06 DIAGNOSIS — E08621 Diabetes mellitus due to underlying condition with foot ulcer: Secondary | ICD-10-CM

## 2024-06-06 DIAGNOSIS — L97422 Non-pressure chronic ulcer of left heel and midfoot with fat layer exposed: Secondary | ICD-10-CM

## 2024-06-06 NOTE — Telephone Encounter (Signed)
 Nurse called to let provider know patient will be seen on 06/08/2024 since he has been seen in office today he is only under contract twice a week for wound vac and has no insurance. Provider did his dressing today in office.

## 2024-06-20 ENCOUNTER — Inpatient Hospital Stay
Admission: EM | Admit: 2024-06-20 | Discharge: 2024-06-24 | DRG: 617 | Disposition: A | Discharge status: Home / Self Care | Payer: MEDICAID | Attending: Internal Medicine | Admitting: Internal Medicine

## 2024-06-20 ENCOUNTER — Telehealth: Payer: Self-pay | Admitting: Podiatry

## 2024-06-20 ENCOUNTER — Ambulatory Visit: Payer: Self-pay | Admitting: Podiatry

## 2024-06-20 ENCOUNTER — Other Ambulatory Visit: Payer: Self-pay

## 2024-06-20 ENCOUNTER — Ambulatory Visit (INDEPENDENT_AMBULATORY_CARE_PROVIDER_SITE_OTHER): Payer: MEDICAID

## 2024-06-20 DIAGNOSIS — L03032 Cellulitis of left toe: Secondary | ICD-10-CM | POA: Diagnosis present

## 2024-06-20 DIAGNOSIS — L97524 Non-pressure chronic ulcer of other part of left foot with necrosis of bone: Secondary | ICD-10-CM

## 2024-06-20 DIAGNOSIS — E11621 Type 2 diabetes mellitus with foot ulcer: Secondary | ICD-10-CM

## 2024-06-20 DIAGNOSIS — Z79899 Other long term (current) drug therapy: Secondary | ICD-10-CM

## 2024-06-20 DIAGNOSIS — L089 Local infection of the skin and subcutaneous tissue, unspecified: Principal | ICD-10-CM

## 2024-06-20 DIAGNOSIS — L02612 Cutaneous abscess of left foot: Secondary | ICD-10-CM

## 2024-06-20 DIAGNOSIS — E785 Hyperlipidemia, unspecified: Secondary | ICD-10-CM | POA: Diagnosis present

## 2024-06-20 DIAGNOSIS — Z794 Long term (current) use of insulin: Secondary | ICD-10-CM

## 2024-06-20 DIAGNOSIS — L97422 Non-pressure chronic ulcer of left heel and midfoot with fat layer exposed: Secondary | ICD-10-CM

## 2024-06-20 DIAGNOSIS — L97429 Non-pressure chronic ulcer of left heel and midfoot with unspecified severity: Secondary | ICD-10-CM | POA: Diagnosis present

## 2024-06-20 DIAGNOSIS — E1165 Type 2 diabetes mellitus with hyperglycemia: Secondary | ICD-10-CM | POA: Diagnosis present

## 2024-06-20 DIAGNOSIS — E669 Obesity, unspecified: Secondary | ICD-10-CM | POA: Diagnosis present

## 2024-06-20 DIAGNOSIS — E1122 Type 2 diabetes mellitus with diabetic chronic kidney disease: Secondary | ICD-10-CM | POA: Diagnosis present

## 2024-06-20 DIAGNOSIS — E11628 Type 2 diabetes mellitus with other skin complications: Secondary | ICD-10-CM | POA: Diagnosis present

## 2024-06-20 DIAGNOSIS — I129 Hypertensive chronic kidney disease with stage 1 through stage 4 chronic kidney disease, or unspecified chronic kidney disease: Secondary | ICD-10-CM | POA: Diagnosis present

## 2024-06-20 DIAGNOSIS — N1832 Chronic kidney disease, stage 3b: Secondary | ICD-10-CM | POA: Diagnosis present

## 2024-06-20 DIAGNOSIS — Z823 Family history of stroke: Secondary | ICD-10-CM

## 2024-06-20 DIAGNOSIS — E1142 Type 2 diabetes mellitus with diabetic polyneuropathy: Secondary | ICD-10-CM | POA: Diagnosis present

## 2024-06-20 DIAGNOSIS — Z8249 Family history of ischemic heart disease and other diseases of the circulatory system: Secondary | ICD-10-CM

## 2024-06-20 DIAGNOSIS — Z6833 Body mass index (BMI) 33.0-33.9, adult: Secondary | ICD-10-CM

## 2024-06-20 DIAGNOSIS — L97509 Non-pressure chronic ulcer of other part of unspecified foot with unspecified severity: Secondary | ICD-10-CM | POA: Diagnosis present

## 2024-06-20 LAB — BASIC METABOLIC PANEL WITH GFR
Anion gap: 10 (ref 5–15)
BUN: 35 mg/dL — ABNORMAL HIGH (ref 6–20)
CO2: 20 mmol/L — ABNORMAL LOW (ref 22–32)
Calcium: 9.2 mg/dL (ref 8.9–10.3)
Chloride: 102 mmol/L (ref 98–111)
Creatinine, Ser: 2.29 mg/dL — ABNORMAL HIGH (ref 0.61–1.24)
GFR, Estimated: 34 mL/min — ABNORMAL LOW (ref >60)
Glucose, Bld: 342 mg/dL — ABNORMAL HIGH (ref 70–99)
Potassium: 4.4 mmol/L (ref 3.5–5.1)
Sodium: 132 mmol/L — ABNORMAL LOW (ref 135–145)

## 2024-06-20 LAB — CBC WITH DIFFERENTIAL/PLATELET
Abs Immature Granulocytes: 0.02 K/uL (ref 0.00–0.07)
Basophils Absolute: 0.1 K/uL (ref 0.0–0.1)
Basophils Relative: 1 %
Eosinophils Absolute: 0.2 K/uL (ref 0.0–0.5)
Eosinophils Relative: 3 %
HCT: 38.5 % — ABNORMAL LOW (ref 39.0–52.0)
Hemoglobin: 12.6 g/dL — ABNORMAL LOW (ref 13.0–17.0)
Immature Granulocytes: 0 %
Lymphocytes Relative: 18 %
Lymphs Abs: 1.5 K/uL (ref 0.7–4.0)
MCH: 31 pg (ref 26.0–34.0)
MCHC: 32.7 g/dL (ref 30.0–36.0)
MCV: 94.8 fL (ref 80.0–100.0)
Monocytes Absolute: 0.5 K/uL (ref 0.1–1.0)
Monocytes Relative: 6 %
Neutro Abs: 6 K/uL (ref 1.7–7.7)
Neutrophils Relative %: 72 %
Platelets: 267 K/uL (ref 150–400)
RBC: 4.06 MIL/uL — ABNORMAL LOW (ref 4.22–5.81)
RDW: 13 % (ref 11.5–15.5)
WBC: 8.2 K/uL (ref 4.0–10.5)
nRBC: 0 % (ref 0.0–0.2)

## 2024-06-20 LAB — LACTIC ACID, PLASMA: Lactic Acid, Venous: 1.8 mmol/L (ref 0.5–1.9)

## 2024-06-20 MED ORDER — VANCOMYCIN HCL IN DEXTROSE 1-5 GM/200ML-% IV SOLN: 1000.0000 mg | Freq: Once | INTRAVENOUS | Status: DC

## 2024-06-20 MED ORDER — LACTATED RINGERS IV BOLUS (SEPSIS)
250.0000 mL | Freq: Once | INTRAVENOUS | Status: AC
  Administered 2024-06-21: 250 mL via INTRAVENOUS

## 2024-06-20 MED ORDER — HYDRALAZINE HCL 20 MG/ML IJ SOLN
20.0000 mg | Freq: Once | INTRAMUSCULAR | Status: AC
  Administered 2024-06-21: 20 mg via INTRAVENOUS
  Filled 2024-06-20: qty 1

## 2024-06-20 MED ORDER — SODIUM CHLORIDE 0.9 % IV SOLN
2.0000 g | Freq: Once | INTRAVENOUS | Status: AC
  Administered 2024-06-21: 2 g via INTRAVENOUS
  Filled 2024-06-20: qty 20

## 2024-06-20 MED ORDER — VANCOMYCIN HCL 2000 MG/400ML IV SOLN
2000.0000 mg | Freq: Once | INTRAVENOUS | Status: AC
  Administered 2024-06-21: 2000 mg via INTRAVENOUS
  Filled 2024-06-20: qty 400

## 2024-06-20 MED ORDER — LACTATED RINGERS IV SOLN: INTRAVENOUS | Status: AC

## 2024-06-20 NOTE — Progress Notes (Signed)
 Subjective: Chief Complaint  Patient presents with   Diabetic Ulcer    Left foot - currently has wound vac on - [facility] comes out to his home every 4 days to change the vac.    54 year old male presents the office today for an acute appointment given concerns of a blister on the bottom of his left foot.  He has a wound VAC and schedule be changed on Thursday.  Still on his IV antibiotics.  Objective: AAO x3, NAD Left foot is edematous.  There is a large fluctuant blister on the plantar aspect of the midfoot.  Once I remove the wound back a significant purulence was expressed from the plantar foot.  Not increased temperature to the foot present. No pain with calf compression, swelling, warmth, erythema  Assessment: Large abscess left plantar midfoot  Plan: Wound was removed today and a large abscess was noted.  Wound culture was obtained.  Given the extent of the infection I recommended the patient to return back to the hospital.  We contacted [facility] spoke with Olam about the patient coming from clinic to the emergency room.  Patient will need to be admitted and continue with antibiotics and an MRI is needed.  Will need to have at least surgical debridement.  We did briefly discussed the risk of amputation.  Patient is aware this is a possibility.  Patient is going directly to the ED  No follow-ups on file.  Donnice JONELLE Fees DPM

## 2024-06-20 NOTE — Telephone Encounter (Signed)
 Patient called stating he has a blister on his foot. Patient states he has an appointment on Friday should he wait to come then or does he need to be seen before that.

## 2024-06-20 NOTE — Telephone Encounter (Signed)
 Patients nurse called left message patient is in need of urgent appointment today.

## 2024-06-20 NOTE — ED Provider Notes (Signed)
 [facility] Provider Note   Event Date/Time   First MD Initiated Contact with Patient 06/20/24 2309     (approximate) History  Foot Pain  HPI Clayton [name] is a 53 y.o. male with past medical history of CKD, type 2 diabetes, poorly controlled hypertension and chronic left foot wound who presents for a new lesion and purulent drainage to the sole of the left foot that was found during a podiatry appointment today.  This wound is open and draining purulent material.  Per Dr. Jameson note from today, patient will require admission for IV antibiotics and MRI as well as likely washout in the OR.  Patient denies any significant pain at this time.  Patient has not been on any antibiotics ROS: Patient currently denies any vision changes, tinnitus, difficulty speaking, facial droop, sore throat, chest pain, shortness of breath, abdominal pain, nausea/vomiting/diarrhea, dysuria, or weakness/numbness/paresthesias in any extremity   Physical Exam  Triage Vital Signs: ED Triage Vitals  Encounter Vitals Group     BP 06/20/24 1947 (!) 218/113     Girls Systolic BP Percentile --      Girls Diastolic BP Percentile --      Boys Systolic BP Percentile --      Boys Diastolic BP Percentile --      Pulse Rate 06/20/24 1946 77     Resp 06/20/24 1946 19     Temp 06/20/24 1946 98.5 F (36.9 C)     Temp src --      SpO2 06/20/24 1946 98 %     Weight 06/20/24 1945 244 lb (110.7 kg)     Height 06/20/24 1945 6' (1.829 m)     Head Circumference --      Peak Flow --      Pain Score 06/20/24 1944 0     Pain Loc --      Pain Education --      Exclude from Growth Chart --    Most recent vital signs: Vitals:   06/20/24 1946 06/20/24 1947  BP:  (!) 218/113  Pulse: 77   Resp: 19   Temp: 98.5 F (36.9 C)   SpO2: 98%    General: Awake, oriented x4. CV:  Good peripheral perfusion. Resp:  Normal effort. Abd:  No distention. Other:  Middle-aged obese African-American male resting  comfortably in no acute distress.  Open chronic wound to the lateral aspect of the left foot.  There is also a 3 cm diameter area of erythema with central purulent drainage to the medial midfoot.  Please see accompanying photos in media tab ED Results / Procedures / Treatments  Labs (all labs ordered are listed, but only abnormal results are displayed) Labs Reviewed  CBC WITH DIFFERENTIAL/PLATELET - Abnormal; Notable for the following components:      Result Value   RBC 4.06 (*)    Hemoglobin 12.6 (*)    HCT 38.5 (*)    All other components within normal limits  BASIC METABOLIC PANEL WITH GFR - Abnormal; Notable for the following components:   Sodium 132 (*)    CO2 20 (*)    Glucose, Bld 342 (*)    BUN 35 (*)    Creatinine, Ser 2.29 (*)    GFR, Estimated 34 (*)    All other components within normal limits  CULTURE, BLOOD (ROUTINE X 2)  CULTURE, BLOOD (ROUTINE X 2)  LACTIC ACID, PLASMA  RADIOLOGY ED MD interpretation: Pending at time of disposition - All  radiology independently interpreted and agree with radiology assessment Official radiology report(s): No results found. PROCEDURES: Critical Care performed: No Procedures MEDICATIONS ORDERED IN ED: Medications  lactated ringers  infusion (has no administration in time range)  lactated ringers  bolus 250 mL (has no administration in time range)  cefTRIAXone  (ROCEPHIN ) 2 g in sodium chloride  0.9 % 100 mL IVPB (has no administration in time range)  hydrALAZINE  (APRESOLINE ) injection 20 mg (has no administration in time range)  vancomycin  (VANCOREADY) IVPB 2000 mg/400 mL (has no administration in time range)   IMPRESSION / MDM / ASSESSMENT AND PLAN / ED COURSE  I reviewed the triage vital signs and the nursing notes.                             The patient is on the cardiac monitor to evaluate for evidence of arrhythmia and/or significant heart rate changes. Patient's presentation is most consistent with acute presentation with  potential threat to life or bodily function. Patient is a 53 year old male with above-stated past medical history presents for a new wound to the left foot concerning for an abscess.  Patient was sent to the emergency department for admission, IV antibiotics, and washout in the OR tomorrow DDx: Soft tissue infection, osteomyelitis, chronic diabetic foot wound, sepsis, bacteremia Plan: CBC hemoglobin 12.6 BMP-creatinine 2.29, BUN 35, glucose 342, sodium 132, CO2 20 Lactic acid-1.8 Empiric antibiotics  Disposition: Admit to medicine   FINAL CLINICAL IMPRESSION(S) / ED DIAGNOSES   Final diagnoses:  Diabetic ulcer of left midfoot associated with type 2 diabetes mellitus, unspecified ulcer stage (HCC)  Abscess of left foot   Rx / DC Orders   ED Discharge Orders     None      Note:  This document was prepared using Dragon voice recognition software and may include unintentional dictation errors.   Jossie Artist POUR, MD 06/21/24 [phone]

## 2024-06-20 NOTE — ED Notes (Signed)
 Call was received PTA from [facility] regarding pt--recent I&D of left foot wound; wound vac removed in office today but pt being sent over for further eval concerning persistent infection

## 2024-06-20 NOTE — Progress Notes (Signed)
 CODE SEPSIS - PHARMACY COMMUNICATION  **Broad Spectrum Antibiotics should be administered within 1 hour of Sepsis diagnosis**  Time Code Sepsis Called/Page Received: 2342  Antibiotics Ordered: Ceftriaxone  & Vancomycin   Time of 1st antibiotic administration: 0024  Rankin CANDIE Dills, PharmD, MBA 06/20/2024 11:45 PM

## 2024-06-20 NOTE — Progress Notes (Signed)
 Chief Complaint  Patient presents with   Wound Check    Pt is here due to right foot has a wound on the side of the foot states he had irrigation and debridement done, states it feels better currently has a wound vac on still on medications due to infection.    Subjective:  Patient presents today status post LT foot irrigation and debridement w/ application of dermal allograft and wound vac application. Inpatient at [facility]. Dr. Marolyn Honour. DOS: 05/17/2024.   Patient doing well. Wound vac intact.   Past Medical History:  Diagnosis Date   Asthma    CKD (chronic kidney disease), stage III (HCC)    Hypertension    Type II diabetes mellitus with renal manifestations [facility])     Past Surgical History:  Procedure Laterality Date   APPLICATION OF WOUND VAC Left 05/15/2024   Procedure: APPLICATION, WOUND VAC;  Surgeon: Honour Marsa FALCON, DPM;  Location: [facility] ORS;  Service: Orthopedics/Podiatry;  Laterality: Left;   APPLICATION OF WOUND VAC Left 05/17/2024   Procedure: APPLICATION, WOUND VAC;  Surgeon: Honour Marsa FALCON, DPM;  Location: [facility] ORS;  Service: Orthopedics/Podiatry;  Laterality: Left;   CATARACT EXTRACTION     GRAFT APPLICATION Left 05/17/2024   Procedure: GRAFT APPLICATION;  Surgeon: Honour Marsa FALCON, DPM;  Location: [facility] ORS;  Service: Orthopedics/Podiatry;  Laterality: Left;   IRRIGATION AND DEBRIDEMENT FOOT Left 05/15/2024   Procedure: IRRIGATION AND DEBRIDEMENT FOOT;  Surgeon: Honour Marsa FALCON, DPM;  Location: [facility] ORS;  Service: Orthopedics/Podiatry;  Laterality: Left;  WITH BONE BIOPSY   IRRIGATION AND DEBRIDEMENT FOOT Left 05/17/2024   Procedure: IRRIGATION AND DEBRIDEMENT FOOT;  Surgeon: Honour Marsa FALCON, DPM;  Location: [facility] ORS;  Service: Orthopedics/Podiatry;  Laterality: Left;    No Known Allergies     LT foot 06/06/2024  Objective/Physical Exam Ulcer noted to the left foot with granular wound base. Central portion of  the wound is fibrotic. Maceration noted periwound with staples intact. No purulence. No malodor. Serosanginous drainage noted. Overall wound is stable.   MR FOOT LEFT W WO CONTRAST 05/15/2024 IMPRESSION: 1. Large open wound on the plantar and lateral aspect of the midfoot hindfoot junction region. 2. Diffuse cellulitis and myofasciitis without definite findings for pyomyositis. 3. Fluid collection wrapping around the bases of the fourth and fifth metatarsals dorsally could be postoperative fluid collection or dissecting abscess. 4. 14 x 13 mm cystic area in the plantar subcutaneous fat slightly more distally. I do not see any surrounding inflammatory changes to suggest this is an abscess but a small abscess is possible. 5. No definite MR findings to suggest osteomyelitis or septic arthritis.  Assessment: 1. s/p . LT foot irrigation and debridement w/ application of dermal allograft and wound vac application. Inpatient at [facility]. Dr. Marolyn Honour. DOS: 05/17/2024.    Plan of Care:  -Patient was evaluated. -wound appears stable with healthy granular tissue -Continue antibiotics as recommended by ID. Note 06/01/2024, wound culture was Staphylococcus aureus, Streptococcus mitis and moderate Prevotella melena [name] which was beta-lactamase positive. The blood culture was negative. The bone pathology from the left cuboid was acute osteomyelitis.  metronidazole  500 mg p.o. twice daily  We will start him on cefadroxil  500 mg p.o. twice daily x minimum 1 month -Continue wound vac dressing changes -Return to clinic 4 weeks.    Thresa EMERSON Sar, DPM [facility]  Dr. Thresa EMERSON Sar, DPM    [address]  [place], [place] [address]                Office [phone]  Fax [phone]

## 2024-06-20 NOTE — Telephone Encounter (Signed)
 Called pt scheduled him an appt in [place] after hours

## 2024-06-20 NOTE — Telephone Encounter (Signed)
 Nurse has left message stating patient is in need of urgent appointment for today I have notified Marjorie and provider.

## 2024-06-20 NOTE — Telephone Encounter (Signed)
 Patient called in as he has had what he believes a blister come up on his left foot. He is scheduled for this Friday 9/5 and is wanting to know what, if anything he should do until he is seen. Please advise, thanks.

## 2024-06-20 NOTE — ED Triage Notes (Addendum)
 Pt reports he had blister appear on the bottom of his left foot over the weekend, pt was seen at [facility], and told to come to ER for further evaluation. Pt reports he has had drainage from area on foot, pt had surgery last month to foot for infection clean out and had wound vac to foot that was taken off today.

## 2024-06-21 ENCOUNTER — Encounter
Admission: EM | Disposition: A | Discharge status: Home / Self Care | Payer: Self-pay | Source: Home / Self Care | Attending: Internal Medicine

## 2024-06-21 ENCOUNTER — Inpatient Hospital Stay: Payer: MEDICAID | Admitting: General Practice

## 2024-06-21 ENCOUNTER — Inpatient Hospital Stay: Payer: MEDICAID

## 2024-06-21 ENCOUNTER — Other Ambulatory Visit: Payer: Self-pay

## 2024-06-21 ENCOUNTER — Emergency Department: Payer: MEDICAID

## 2024-06-21 ENCOUNTER — Encounter: Payer: Self-pay | Admitting: Internal Medicine

## 2024-06-21 DIAGNOSIS — N189 Chronic kidney disease, unspecified: Secondary | ICD-10-CM

## 2024-06-21 DIAGNOSIS — B955 Unspecified streptococcus as the cause of diseases classified elsewhere: Secondary | ICD-10-CM

## 2024-06-21 DIAGNOSIS — Z794 Long term (current) use of insulin: Secondary | ICD-10-CM

## 2024-06-21 DIAGNOSIS — B9689 Other specified bacterial agents as the cause of diseases classified elsewhere: Secondary | ICD-10-CM

## 2024-06-21 DIAGNOSIS — E1122 Type 2 diabetes mellitus with diabetic chronic kidney disease: Secondary | ICD-10-CM

## 2024-06-21 DIAGNOSIS — L97509 Non-pressure chronic ulcer of other part of unspecified foot with unspecified severity: Secondary | ICD-10-CM | POA: Diagnosis present

## 2024-06-21 DIAGNOSIS — L03032 Cellulitis of left toe: Secondary | ICD-10-CM

## 2024-06-21 DIAGNOSIS — L02612 Cutaneous abscess of left foot: Secondary | ICD-10-CM | POA: Diagnosis present

## 2024-06-21 DIAGNOSIS — E11628 Type 2 diabetes mellitus with other skin complications: Secondary | ICD-10-CM

## 2024-06-21 DIAGNOSIS — M86172 Other acute osteomyelitis, left ankle and foot: Secondary | ICD-10-CM

## 2024-06-21 HISTORY — PX: IRRIGATION AND DEBRIDEMENT ABSCESS: SHX5252

## 2024-06-21 LAB — GLUCOSE, CAPILLARY
Glucose-Capillary: 213 mg/dL — ABNORMAL HIGH (ref 70–99)
Glucose-Capillary: 194 mg/dL — ABNORMAL HIGH (ref 70–99)
Glucose-Capillary: 195 mg/dL — ABNORMAL HIGH (ref 70–99)
Glucose-Capillary: 189 mg/dL — ABNORMAL HIGH (ref 70–99)
Glucose-Capillary: 147 mg/dL — ABNORMAL HIGH (ref 70–99)

## 2024-06-21 LAB — CK: Total CK: 38 U/L — ABNORMAL LOW (ref 49–397)

## 2024-06-21 LAB — MRSA NEXT GEN BY PCR, NASAL: MRSA by PCR Next Gen: NOT DETECTED

## 2024-06-21 LAB — SEDIMENTATION RATE: Sed Rate: 15 mm/h (ref 0–20)

## 2024-06-21 SURGERY — IRRIGATION AND DEBRIDEMENT ABSCESS
Anesthesia: General | Laterality: Left

## 2024-06-21 MED ORDER — ALBUTEROL SULFATE (2.5 MG/3ML) 0.083% IN NEBU: 3.0000 mL | INHALATION_SOLUTION | Freq: Four times a day (QID) | RESPIRATORY_TRACT | Status: DC | PRN

## 2024-06-21 MED ORDER — TOBRAMYCIN SULFATE 80 MG/2ML IJ SOLN
INTRAMUSCULAR | Status: AC
  Filled 2024-06-21: qty 4

## 2024-06-21 MED ORDER — PROPOFOL 500 MG/50ML IV EMUL
INTRAVENOUS | Status: DC | PRN
  Administered 2024-06-21: 75 ug/kg/min via INTRAVENOUS
  Administered 2024-06-21: 40 mg via INTRAVENOUS

## 2024-06-21 MED ORDER — FENTANYL CITRATE (PF) 100 MCG/2ML IJ SOLN
INTRAMUSCULAR | Status: AC
  Filled 2024-06-21: qty 2

## 2024-06-21 MED ORDER — AMLODIPINE BESYLATE 10 MG PO TABS: 10.0000 mg | ORAL_TABLET | Freq: Every day | ORAL | Status: DC

## 2024-06-21 MED ORDER — OXYCODONE HCL 5 MG PO TABS
5.0000 mg | ORAL_TABLET | Freq: Once | ORAL | Status: AC | PRN
  Administered 2024-06-21: 5 mg via ORAL

## 2024-06-21 MED ORDER — LIDOCAINE HCL (PF) 1 % IJ SOLN
INTRAMUSCULAR | Status: AC
  Filled 2024-06-21: qty 30

## 2024-06-21 MED ORDER — PHENYLEPHRINE HCL (PRESSORS) 10 MG/ML IV SOLN
INTRAVENOUS | Status: DC | PRN
  Administered 2024-06-21: 40 ug via INTRAVENOUS
  Administered 2024-06-21: 80 ug via INTRAVENOUS

## 2024-06-21 MED ORDER — 0.9 % SODIUM CHLORIDE (POUR BTL) OPTIME
TOPICAL | Status: DC | PRN
  Administered 2024-06-21: 500 mL

## 2024-06-21 MED ORDER — GADOBUTROL 1 MMOL/ML IV SOLN
10.0000 mL | Freq: Once | INTRAVENOUS | Status: AC | PRN
  Administered 2024-06-21: 10 mL via INTRAVENOUS

## 2024-06-21 MED ORDER — OXYCODONE HCL 5 MG PO TABS
ORAL_TABLET | ORAL | Status: AC
  Filled 2024-06-21: qty 1

## 2024-06-21 MED ORDER — CARVEDILOL 25 MG PO TABS
25.0000 mg | ORAL_TABLET | Freq: Two times a day (BID) | ORAL | Status: DC
  Administered 2024-06-21 – 2024-06-24 (×7): 25 mg via ORAL
  Filled 2024-06-21 (×7): qty 1

## 2024-06-21 MED ORDER — LACTATED RINGERS IV SOLN: INTRAVENOUS | Status: DC | PRN

## 2024-06-21 MED ORDER — GLYCOPYRROLATE 0.2 MG/ML IJ SOLN
INTRAMUSCULAR | Status: DC | PRN
Start: 2024-06-21 — End: 2024-06-21
  Administered 2024-06-21: .2 mg via INTRAVENOUS

## 2024-06-21 MED ORDER — BUPIVACAINE HCL 0.5 % IJ SOLN
INTRAMUSCULAR | Status: DC | PRN
  Administered 2024-06-21: 10 mL

## 2024-06-21 MED ORDER — ACETAMINOPHEN 325 MG PO TABS
650.0000 mg | ORAL_TABLET | Freq: Four times a day (QID) | ORAL | Status: DC | PRN
  Administered 2024-06-22 (×2): 650 mg via ORAL
  Filled 2024-06-21 (×2): qty 2

## 2024-06-21 MED ORDER — LIDOCAINE HCL (CARDIAC) PF 100 MG/5ML IV SOSY
PREFILLED_SYRINGE | INTRAVENOUS | Status: DC | PRN
  Administered 2024-06-21: 40 mg via INTRAVENOUS

## 2024-06-21 MED ORDER — LORATADINE 10 MG PO TABS: 10.0000 mg | ORAL_TABLET | Freq: Every day | ORAL | Status: DC

## 2024-06-21 MED ORDER — LOSARTAN POTASSIUM 50 MG PO TABS
100.0000 mg | ORAL_TABLET | Freq: Every day | ORAL | Status: DC
  Administered 2024-06-21 – 2024-06-24 (×4): 100 mg via ORAL
  Filled 2024-06-21 (×4): qty 2

## 2024-06-21 MED ORDER — INSULIN ASPART 100 UNIT/ML IJ SOLN: 0.0000 [IU] | Freq: Every day | INTRAMUSCULAR | Status: DC

## 2024-06-21 MED ORDER — ENOXAPARIN SODIUM 60 MG/0.6ML IJ SOSY
60.0000 mg | PREFILLED_SYRINGE | INTRAMUSCULAR | Status: DC
  Administered 2024-06-21 – 2024-06-22 (×2): 60 mg via SUBCUTANEOUS
  Filled 2024-06-21 (×2): qty 0.6

## 2024-06-21 MED ORDER — VANCOMYCIN HCL 1500 MG/300ML IV SOLN
1500.0000 mg | INTRAVENOUS | Status: DC
  Filled 2024-06-21: qty 300

## 2024-06-21 MED ORDER — ONDANSETRON HCL 4 MG PO TABS
4.0000 mg | ORAL_TABLET | Freq: Four times a day (QID) | ORAL | Status: DC | PRN
Start: 2024-06-21 — End: 2024-06-24

## 2024-06-21 MED ORDER — ROSUVASTATIN CALCIUM 10 MG PO TABS: 20.0000 mg | ORAL_TABLET | Freq: Every day | ORAL | Status: DC

## 2024-06-21 MED ORDER — ONDANSETRON HCL 4 MG/2ML IJ SOLN: 4.0000 mg | Freq: Four times a day (QID) | INTRAMUSCULAR | Status: DC | PRN

## 2024-06-21 MED ORDER — DAPTOMYCIN-SODIUM CHLORIDE 700-0.9 MG/100ML-% IV SOLN
8.0000 mg/kg | Freq: Every day | INTRAVENOUS | Status: DC
  Administered 2024-06-21 – 2024-06-24 (×4): 700 mg via INTRAVENOUS
  Filled 2024-06-21 (×5): qty 100

## 2024-06-21 MED ORDER — ACETAMINOPHEN 650 MG RE SUPP: 650.0000 mg | Freq: Four times a day (QID) | RECTAL | Status: DC | PRN

## 2024-06-21 MED ORDER — PIPERACILLIN-TAZOBACTAM 3.375 G IVPB
INTRAVENOUS | Status: AC
Start: 2024-06-21 — End: 2024-06-21
  Filled 2024-06-21: qty 50

## 2024-06-21 MED ORDER — INSULIN ASPART 100 UNIT/ML IJ SOLN
0.0000 [IU] | Freq: Three times a day (TID) | INTRAMUSCULAR | Status: DC
  Administered 2024-06-21: 2 [IU] via SUBCUTANEOUS
  Administered 2024-06-22: 3 [IU] via SUBCUTANEOUS
  Administered 2024-06-22: 2 [IU] via SUBCUTANEOUS
  Administered 2024-06-23 (×3): 1 [IU] via SUBCUTANEOUS
  Filled 2024-06-21 (×6): qty 1

## 2024-06-21 MED ORDER — OXYCODONE HCL 5 MG/5ML PO SOLN: 5.0000 mg | Freq: Once | ORAL | Status: AC | PRN

## 2024-06-21 MED ORDER — PANTOPRAZOLE SODIUM 40 MG PO TBEC
40.0000 mg | DELAYED_RELEASE_TABLET | Freq: Every day | ORAL | Status: DC
  Administered 2024-06-21: 40 mg via ORAL
  Filled 2024-06-21: qty 1

## 2024-06-21 MED ORDER — VANCOMYCIN HCL 1000 MG IV SOLR
INTRAVENOUS | Status: AC
  Filled 2024-06-21: qty 20

## 2024-06-21 MED ORDER — INSULIN LISPRO PROT & LISPRO (75-25 MIX) 100 UNIT/ML KWIKPEN: 15.0000 [IU] | PEN_INJECTOR | Freq: Two times a day (BID) | SUBCUTANEOUS | Status: DC

## 2024-06-21 MED ORDER — FENTANYL CITRATE (PF) 100 MCG/2ML IJ SOLN
INTRAMUSCULAR | Status: AC
Start: 2024-06-21 — End: 2024-06-21
  Filled 2024-06-21: qty 2

## 2024-06-21 MED ORDER — PIPERACILLIN-TAZOBACTAM 3.375 G IVPB
3.3750 g | Freq: Three times a day (TID) | INTRAVENOUS | Status: AC
  Administered 2024-06-21 – 2024-06-23 (×8): 3.375 g via INTRAVENOUS
  Filled 2024-06-21 (×8): qty 50

## 2024-06-21 MED ORDER — BUPIVACAINE HCL (PF) 0.5 % IJ SOLN
INTRAMUSCULAR | Status: AC
Start: 2024-06-21 — End: 2024-06-21
  Filled 2024-06-21: qty 30

## 2024-06-21 MED ORDER — LIDOCAINE HCL 1 % IJ SOLN
INTRAMUSCULAR | Status: DC | PRN
Start: 2024-06-21 — End: 2024-06-21
  Administered 2024-06-21: 10 mL

## 2024-06-21 MED ORDER — FENTANYL CITRATE (PF) 100 MCG/2ML IJ SOLN
25.0000 ug | INTRAMUSCULAR | Status: DC | PRN
  Administered 2024-06-21: 25 ug via INTRAVENOUS

## 2024-06-21 MED ORDER — DEXMEDETOMIDINE HCL IN NACL 80 MCG/20ML IV SOLN
INTRAVENOUS | Status: DC | PRN
  Administered 2024-06-21 (×2): 4 ug via INTRAVENOUS

## 2024-06-21 MED ORDER — MIDAZOLAM HCL 2 MG/2ML IJ SOLN
INTRAMUSCULAR | Status: DC | PRN
  Administered 2024-06-21: 2 mg via INTRAVENOUS

## 2024-06-21 MED ORDER — PROPOFOL 10 MG/ML IV BOLUS
INTRAVENOUS | Status: DC | PRN
Start: 2024-06-21 — End: 2024-06-21
  Administered 2024-06-21: 50 mg via INTRAVENOUS

## 2024-06-21 MED ORDER — MIDAZOLAM HCL 2 MG/2ML IJ SOLN
INTRAMUSCULAR | Status: AC
  Filled 2024-06-21: qty 2

## 2024-06-21 MED ORDER — INSULIN ASPART PROT & ASPART (70-30 MIX) 100 UNIT/ML ~~LOC~~ SUSP
15.0000 [IU] | Freq: Two times a day (BID) | SUBCUTANEOUS | Status: DC
  Administered 2024-06-21 – 2024-06-22 (×2): 15 [IU] via SUBCUTANEOUS
  Filled 2024-06-21: qty 10

## 2024-06-21 MED ORDER — FENTANYL CITRATE (PF) 100 MCG/2ML IJ SOLN
INTRAMUSCULAR | Status: DC | PRN
  Administered 2024-06-21 (×2): 50 ug via INTRAVENOUS

## 2024-06-21 SURGICAL SUPPLY — 56 items
BLADE MED AGGRESSIVE (BLADE) IMPLANT
BNDG COHESIVE 4X5 TAN STRL LF (GAUZE/BANDAGES/DRESSINGS) ×1 IMPLANT
BNDG ELASTIC 4INX 5YD STR LF (GAUZE/BANDAGES/DRESSINGS) ×1 IMPLANT
BNDG ESMARCH 4X12 STRL LF (GAUZE/BANDAGES/DRESSINGS) ×1 IMPLANT
BNDG GAUZE DERMACEA FLUFF 4 (GAUZE/BANDAGES/DRESSINGS) ×1 IMPLANT
BNDG STRETCH GAUZE 3IN X12FT (GAUZE/BANDAGES/DRESSINGS) ×1 IMPLANT
CANISTER WOUND CARE 500ML ATS (WOUND CARE) IMPLANT
CNTNR URN SCR LID CUP LEK RST (MISCELLANEOUS) IMPLANT
CUFF TOURN SGL QUICK 18X4 (TOURNIQUET CUFF) IMPLANT
CUFF TRNQT CYL 24X4X16.5-23 (TOURNIQUET CUFF) IMPLANT
DRAPE FOR WOUND VAC UNIT (DRAPES) IMPLANT
DRSG EMULSION OIL 3X8 NADH (GAUZE/BANDAGES/DRESSINGS) ×1 IMPLANT
DRSG VAC GRANUFOAM MED (GAUZE/BANDAGES/DRESSINGS) IMPLANT
DURAPREP 26ML APPLICATOR (WOUND CARE) ×1 IMPLANT
ELECT CAUTERY BLADE 6.4 (BLADE) IMPLANT
ELECTRODE REM PT RTRN 9FT ADLT (ELECTROSURGICAL) ×1 IMPLANT
GAUZE PACKING 0.25INX5YD STRL (GAUZE/BANDAGES/DRESSINGS) IMPLANT
GAUZE SPONGE 4X4 12PLY STRL (GAUZE/BANDAGES/DRESSINGS) ×1 IMPLANT
GAUZE STRETCH 2X75IN STRL (MISCELLANEOUS) ×1 IMPLANT
GAUZE XEROFORM 1X8 LF (GAUZE/BANDAGES/DRESSINGS) IMPLANT
GLOVE BIOGEL PI IND STRL 7.5 (GLOVE) ×1 IMPLANT
GLOVE SURG SYN 7.5 PF PI (GLOVE) ×1 IMPLANT
GOWN STRL REUS W/ TWL XL LVL3 (GOWN DISPOSABLE) ×1 IMPLANT
GOWN STRL REUS W/TWL MED LVL3 (GOWN DISPOSABLE) ×1 IMPLANT
HANDLE YANKAUER SUCT BULB TIP (MISCELLANEOUS) IMPLANT
HANDPIECE VERSAJET DEBRIDEMENT (MISCELLANEOUS) IMPLANT
IV NS 1000ML BAXH (IV SOLUTION) ×1 IMPLANT
KIT TURNOVER KIT A (KITS) ×1 IMPLANT
LABEL OR SOLS (LABEL) ×1 IMPLANT
MANIFOLD NEPTUNE II (INSTRUMENTS) ×1 IMPLANT
NDL BIOPSY JAMSHIDI 11X6 (NEEDLE) IMPLANT
NDL FILTER BLUNT 18X1 1/2 (NEEDLE) ×1 IMPLANT
NDL HYPO 25X1 1.5 SAFETY (NEEDLE) ×1 IMPLANT
NEEDLE BIOPSY JAMSHIDI 11X6 (NEEDLE) IMPLANT
NEEDLE FILTER BLUNT 18X1 1/2 (NEEDLE) ×1 IMPLANT
NEEDLE HYPO 25X1 1.5 SAFETY (NEEDLE) ×1 IMPLANT
NS IRRIG 500ML POUR BTL (IV SOLUTION) ×1 IMPLANT
PACK EXTREMITY ARMC (MISCELLANEOUS) ×1 IMPLANT
PACKING GAUZE IODOFORM 1INX5YD (GAUZE/BANDAGES/DRESSINGS) IMPLANT
PAD ABD DERMACEA PRESS 5X9 (GAUZE/BANDAGES/DRESSINGS) ×1 IMPLANT
PAD PREP OB/GYN DISP 24X41 (PERSONAL CARE ITEMS) ×1 IMPLANT
PENCIL SMOKE EVACUATOR (MISCELLANEOUS) ×1 IMPLANT
SOL .9 NS 3000ML IRR UROMATIC (IV SOLUTION) IMPLANT
SOLUTION PREP PVP 2OZ (MISCELLANEOUS) ×1 IMPLANT
STAPLER SKIN PROX 35W (STAPLE) ×1 IMPLANT
STOCKINETTE 48X4 2 PLY STRL (GAUZE/BANDAGES/DRESSINGS) ×1 IMPLANT
STOCKINETTE IMPERVIOUS 9X36 MD (GAUZE/BANDAGES/DRESSINGS) ×1 IMPLANT
STOCKINETTE STRL 4IN 9604848 (GAUZE/BANDAGES/DRESSINGS) ×1 IMPLANT
SUT PROLENE 2 0 SH DA (SUTURE) IMPLANT
SUT PROLENE 3 0 PS 2 (SUTURE) IMPLANT
SUTURE ETHLN 4-0 FS2 18XMF BLK (SUTURE) IMPLANT
SWAB CULTURE AMIES ANAERIB BLU (MISCELLANEOUS) IMPLANT
SYR 10ML LL (SYRINGE) ×2 IMPLANT
TIP FAN IRRIG PULSAVAC PLUS (DISPOSABLE) IMPLANT
TRAP FLUID SMOKE EVACUATOR (MISCELLANEOUS) ×1 IMPLANT
WATER STERILE IRR 500ML POUR (IV SOLUTION) ×1 IMPLANT

## 2024-06-21 NOTE — H&P (Signed)
 History and Physical    Clayton [name] FMW:[id number] DOB: [birth year] DOA: 06/20/2024  PCP: Arden Fairy Lot, MD (Confirm with patient/family/NH records and if not entered, this has to be entered at [facility] point of entry) Patient coming from: Home  I have personally briefly reviewed patient's old medical records in [facility]  Chief Complaint: Left foot infection  HPI: Clayton [name] is a 53 y.o. male with medical history significant of IDDM, CKD stage IIIa, HTN, HLD, sent from podiatrist office for evaluation of worsening of left foot infection.  Patient developed a ulcer on bottom of left foot about 1 month ago for which has been following with podiatry for outpatient care as well as p.o. antibiotics Keflex.  Last week, patient started to see some discharges from the wound and went to see podiatry yesterday who sent patient to ED for evaluation of abscess formation in the left foot diabetic ulcer.  Patient denied any pain no fever or chills.  ED Course: Afebrile, no tachycardia blood pressure 160/70 O2 saturation 100% on room air.  Left foot MRI pending, blood work showed sodium 132 potassium 4.4 BUN 35 creatinine 2.2 compared to baseline 2.1-2.6 glucose 342, WBC 8.2 hemoglobin 12.6.  Patient was given vancomycin  and ceftriaxone  in the ED.  Review of Systems: As per HPI otherwise 14 point review of systems negative.    Past Medical History:  Diagnosis Date   Asthma    CKD (chronic kidney disease), stage III (HCC)    Hypertension    Type II diabetes mellitus with renal manifestations [facility])     Past Surgical History:  Procedure Laterality Date   APPLICATION OF WOUND VAC Left 05/15/2024   Procedure: APPLICATION, WOUND VAC;  Surgeon: Malvin Marsa FALCON, DPM;  Location: [facility] ORS;  Service: Orthopedics/Podiatry;  Laterality: Left;   APPLICATION OF WOUND VAC Left 05/17/2024   Procedure: APPLICATION, WOUND VAC;  Surgeon: Malvin Marsa FALCON, DPM;  Location: [facility] ORS;   Service: Orthopedics/Podiatry;  Laterality: Left;   CATARACT EXTRACTION     GRAFT APPLICATION Left 05/17/2024   Procedure: GRAFT APPLICATION;  Surgeon: Malvin Marsa FALCON, DPM;  Location: [facility] ORS;  Service: Orthopedics/Podiatry;  Laterality: Left;   IRRIGATION AND DEBRIDEMENT FOOT Left 05/15/2024   Procedure: IRRIGATION AND DEBRIDEMENT FOOT;  Surgeon: Malvin Marsa FALCON, DPM;  Location: [facility] ORS;  Service: Orthopedics/Podiatry;  Laterality: Left;  WITH BONE BIOPSY   IRRIGATION AND DEBRIDEMENT FOOT Left 05/17/2024   Procedure: IRRIGATION AND DEBRIDEMENT FOOT;  Surgeon: Malvin Marsa FALCON, DPM;  Location: [facility] ORS;  Service: Orthopedics/Podiatry;  Laterality: Left;     reports that he has never smoked. He has never used smokeless tobacco. He reports current alcohol use. He reports that he does not currently use drugs.  No Known Allergies  Family History  Problem Relation Age of Onset   Stroke Mother    Hypertension Mother    Hypertension Father      Prior to Admission medications   Medication Sig Start Date End Date Taking? Authorizing Provider  albuterol  (VENTOLIN  HFA) 108 (90 Base) MCG/ACT inhaler Inhale 2 puffs into the lungs every 6 (six) hours as needed for wheezing or shortness of breath. 09/28/23   Von Bellis, MD  amLODipine  (NORVASC ) 10 MG tablet Take 1 tablet (10 mg total) by mouth daily. 04/23/21 06/06/24  Awanda City, MD  carvedilol  (COREG ) 25 MG tablet Take 1 tablet (25 mg total) by mouth 2 (two) times daily. 05/19/24 08/17/24  Lanetta Lingo, MD  cefadroxil  (DURICEF) 500 MG capsule  Take 1 capsule (500 mg total) by mouth 2 (two) times daily. 06/01/24   Fayette Bodily, MD  Continuous Glucose Sensor (FREESTYLE LIBRE 3 SENSOR) MISC :1 Each Every 2 Weeks 12/02/23   [provider]  Insulin  Lispro Prot & Lispro (HUMALOG  75/25 MIX) (75-25) 100 UNIT/ML Kwikpen Inject 20 Units into the skin 2 (two) times daily. 05/19/24 08/17/24  Lanetta Lingo, MD  Insulin   Pen Needle (INSUPEN PEN NEEDLES) 31G X 5 MM MISC Use 2 times daily with Humalog  Mix Kwikpen 05/19/24   Agbata, Tochukwu, MD  loratadine  (CLARITIN ) 10 MG tablet Take 1 tablet (10 mg total) by mouth at bedtime for 10 days. 09/28/23 06/06/24  Von Bellis, MD  losartan  (COZAAR ) 100 MG tablet Take 1 tablet (100 mg total) by mouth daily. 05/19/24 08/17/24  Lanetta Lingo, MD  pantoprazole  (PROTONIX ) 40 MG tablet Take 1 tablet (40 mg total) by mouth daily. 05/19/24 06/18/24  Lanetta Lingo, MD  rosuvastatin  (CRESTOR ) 20 MG tablet Take 1 tablet (20 mg total) by mouth daily. 05/19/24 06/18/24  Agbata, Tochukwu, MD  Vitamin D , Ergocalciferol , (DRISDOL) 1.25 MG (50000 UNIT) CAPS capsule Take 50,000 Units by mouth once a week. 10/31/20   [provider]  insulin  aspart protamine - aspart (NOVOLOG  70/30 MIX) (70-30) 100 UNIT/ML FlexPen Inject 0.15 mLs (15 Units total) into the skin 2 (two) times daily. 04/25/21 04/25/21  Awanda City, MD    Physical Exam: Vitals:   06/21/24 0019 06/21/24 0030 06/21/24 0100 06/21/24 0325  BP: (!) 227/110 (!) 160/78 (!) 159/77 (!) 164/82  Pulse:  77 85 82  Resp:    18  Temp:    97.8 F (36.6 C)  TempSrc:    Oral  SpO2:  100% 100% 99%  Weight:      Height:        Constitutional: NAD, calm, comfortable Vitals:   06/21/24 0019 06/21/24 0030 06/21/24 0100 06/21/24 0325  BP: (!) 227/110 (!) 160/78 (!) 159/77 (!) 164/82  Pulse:  77 85 82  Resp:    18  Temp:    97.8 F (36.6 C)  TempSrc:    Oral  SpO2:  100% 100% 99%  Weight:      Height:       Eyes: PERRL, lids and conjunctivae normal ENMT: Mucous membranes are moist. Posterior pharynx clear of any exudate or lesions.Normal dentition.  Neck: normal, supple, no masses, no thyromegaly Respiratory: clear to auscultation bilaterally, no wheezing, no crackles. Normal respiratory effort. No accessory muscle use.  Cardiovascular: Regular rate and rhythm, no murmurs / rubs / gallops. No extremity edema. 2+ pedal pulses. No  carotid bruits.  Abdomen: no tenderness, no masses palpated. No hepatosplenomegaly. Bowel sounds positive.  Musculoskeletal: no clubbing / cyanosis. No joint deformity upper and lower extremities. Good ROM, no contractures. Normal muscle tone.  Skin: Left foot in surgical dressing Neurologic: CN 2-12 grossly intact. Sensation intact, DTR normal. Strength 5/5 in all 4.  Psychiatric: Normal judgment and insight. Alert and oriented x 3. Normal mood.     Labs on Admission: I have personally reviewed following labs and imaging studies  CBC: Recent Labs  Lab 06/20/24 1948  WBC 8.2  NEUTROABS 6.0  HGB 12.6*  HCT 38.5*  MCV 94.8  PLT 267   Basic Metabolic Panel: Recent Labs  Lab 06/20/24 1948  NA 132*  K 4.4  CL 102  CO2 20*  GLUCOSE 342*  BUN 35*  CREATININE 2.29*  CALCIUM  9.2   GFR: Estimated Creatinine Clearance:  48.5 mL/min (A) (by C-G formula based on SCr of 2.29 mg/dL (H)). Liver Function Tests: No results for input(s): AST, ALT, ALKPHOS, BILITOT, PROT, ALBUMIN in the last 168 hours. No results for input(s): LIPASE, AMYLASE in the last 168 hours. No results for input(s): AMMONIA in the last 168 hours. Coagulation Profile: No results for input(s): INR, PROTIME in the last 168 hours. Cardiac Enzymes: No results for input(s): CKTOTAL, CKMB, CKMBINDEX, TROPONINI in the last 168 hours. BNP (last 3 results) No results for input(s): PROBNP in the last 8760 hours. HbA1C: No results for input(s): HGBA1C in the last 72 hours. CBG: No results for input(s): GLUCAP in the last 168 hours. Lipid Profile: No results for input(s): CHOL, HDL, LDLCALC, TRIG, CHOLHDL, LDLDIRECT in the last 72 hours. Thyroid Function Tests: No results for input(s): TSH, T4TOTAL, FREET4, T3FREE, THYROIDAB in the last 72 hours. Anemia Panel: No results for input(s): VITAMINB12, FOLATE, FERRITIN, TIBC, IRON, RETICCTPCT in the last 72  hours. Urine analysis:    Component Value Date/Time   COLORURINE YELLOW (A) 05/15/2024 1410   APPEARANCEUR CLOUDY (A) 05/15/2024 1410   LABSPEC 1.020 05/15/2024 1410   PHURINE 5.0 05/15/2024 1410   GLUCOSEU >=500 (A) 05/15/2024 1410   HGBUR MODERATE (A) 05/15/2024 1410   BILIRUBINUR NEGATIVE 05/15/2024 1410   KETONESUR 5 (A) 05/15/2024 1410   PROTEINUR >=300 (A) 05/15/2024 1410   NITRITE NEGATIVE 05/15/2024 1410   LEUKOCYTESUR NEGATIVE 05/15/2024 1410    Radiological Exams on Admission: No results found.  EKG: INone  Assessment/Plan Principal Problem:   Cellulitis and abscess of toe of left foot Active Problems:   Foot ulcer (HCC)  (please populate well all problems here in Problem List. (For example, if patient is on BP meds at home and you resume or decide to hold them, it is a problem that needs to be her. Same for CAD, COPD, HLD and so on)  Left foot diabetic ulcer infection, failed outpatient management Left foot abscess formation Rule out osteomyelitis - Podiatry will take patient to OR for I&D and washout today - MRI is pending to rule out osteomyelitis -Continue vancomycin , add Zosyn . - Send MRSA screen  IDDM with hyperglycemia - SSI - Cut on 70/30 from 20 mg twice daily to 50 mg twice daily  CKD stage III - Euvolemic, continue home BP meds  HTN - Stable, continue amlodipine  and Coreg  and losartan   HLD -Continue statin  Obesity - BMI= 33 - Outpatient GLP-1 agonist evaluation   Total time spent on patient care 55 minutes.  DVT prophylaxis: Lovenox  Code Status: Full code Family Communication: None at bedside Disposition Plan: Patient sick with acute on chronic right foot infection failed outpatient management requiring inpatient podiatry procedure and IV antibiotics, expect more than 2 midnight hospital stay Consults called: Podiatry Admission status: MedSurg admission   Cort ONEIDA Mana MD [facility] Pager [phone]  06/21/2024, 9:02 AM

## 2024-06-21 NOTE — Transfer of Care (Signed)
 Immediate Anesthesia Transfer of Care Note  Patient: Clayton [name]  Procedure(s) Performed: IRRIGATION AND DEBRIDEMENT ABSCESS (Left)  Patient Location: PACU  Anesthesia Type:General  Level of Consciousness: drowsy  Airway & Oxygen Therapy: Patient Spontanous Breathing and Patient connected to face mask oxygen  Post-op Assessment: Report given to RN and Post -op Vital signs reviewed and stable  Post vital signs: Reviewed and stable  Last Vitals:  Vitals Value Taken Time  BP    Temp    Pulse    Resp    SpO2      Last Pain:  Vitals:   06/21/24 1355  TempSrc: Temporal  PainSc: 4          Complications: No notable events documented.

## 2024-06-21 NOTE — TOC Initial Note (Signed)
 Transition of Care [facility]) - Brief Assessment Note    Patient Details  Name: Clayton [name] MRN: [id number] Date of Birth: [birth year]  Transition of Care [facility]) CM/SW Contact:    Dalia GORMAN Fuse, RN Phone Number: 06/21/2024, 1:32 PM  Clinical Narrative:                  Patient admitted from home with L foot ulcer and abscess. Plan for I&D and wash today. Will r/o osteo. No TOC needs at this time, please outreach if TOC needs identified.        Patient Goals and CMS Choice            Expected Discharge Plan and Services                                              Prior Living Arrangements/Services                       Activities of Daily Living   ADL Screening (condition at time of admission) Independently performs ADLs?: Yes (appropriate for developmental age) Is the patient deaf or have difficulty hearing?: No Does the patient have difficulty seeing, even when wearing glasses/contacts?: No Does the patient have difficulty concentrating, remembering, or making decisions?: No  Permission Sought/Granted                  Emotional Assessment              Admission diagnosis:  Abscess of left foot [L02.612] Cellulitis and abscess of toe of left foot [L03.032, L02.612] Diabetic ulcer of left midfoot associated with type 2 diabetes mellitus, unspecified ulcer stage (HCC) [Z88.378, L97.429] Foot ulcer (HCC) [L97.509] Patient Active Problem List   Diagnosis Date Noted   Cellulitis and abscess of toe of left foot 06/21/2024   Foot ulcer (HCC) 06/21/2024   Pyogenic inflammation of bone (HCC) 05/19/2024   DKA (diabetic ketoacidosis) (HCC) 05/15/2024   Sepsis (HCC) 05/15/2024   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, with fat layer exposed (HCC) 05/15/2024   Essential hypertension 05/15/2024   Obesity (BMI 30-39.9) 05/15/2024   CKD stage 4 due to type 2 diabetes mellitus (HCC) 05/15/2024   Anemia of chronic disease  05/15/2024   Ulcer of left foot with necrosis of bone (HCC) 05/15/2024   Acute renal failure superimposed on stage 3b chronic kidney disease (HCC) 09/27/2023   Uncontrolled type 2 diabetes mellitus with hyperglycemia, with long-term current use of insulin  (HCC) 09/27/2023   Acute bronchitis 09/27/2023   Acute respiratory failure with hypoxia (HCC) 09/27/2023   Acute asthma exacerbation 09/27/2023   HTN (hypertension) 09/27/2023   Bacteremia due to Gram-positive bacteria 04/19/2021   Hyponatremia with increased serum osmolality 04/19/2021   Acute urinary retention 04/19/2021   Hypertensive encephalopathy 04/19/2021   Seizure (HCC)    Hypertensive emergency 04/17/2021   Hypertensive urgency 04/17/2021   Nausea & vomiting 04/17/2021   Acute metabolic encephalopathy 04/17/2021   CKD (chronic kidney disease), stage IIIb 04/17/2021   Type II diabetes mellitus with renal manifestations (HCC) 04/17/2021   Leukocytosis 04/17/2021   Elevated lactic acid level 04/17/2021   Elevated troponin 04/17/2021   PCP:  Arden Fairy Lot, MD Pharmacy:   [id number] [place], [place] - [address] [place] [address] Phone: [phone] Fax: [phone]  [facility] [address] [place] [address] Phone: [phone] Fax: [phone]     Social Drivers of Health (SDOH) Social History: SDOH Screenings   Food Insecurity: No Food Insecurity (06/21/2024)  Housing: Low Risk  (06/21/2024)  Transportation Needs: No Transportation Needs (06/21/2024)  Utilities: Not At Risk (06/21/2024)  Financial Resource Strain: Low Risk  (07/07/2023)   Received from [facility]  Tobacco Use: Low Risk  (06/20/2024)  Recent Concern: Tobacco Use - Medium Risk (05/25/2024)   Received from [facility]   SDOH Interventions:     Readmission Risk  Interventions     No data to display

## 2024-06-21 NOTE — Plan of Care (Signed)

## 2024-06-21 NOTE — Sepsis Progress Note (Signed)
 Following for sepsis monitoring ?

## 2024-06-21 NOTE — Progress Notes (Signed)
 PHARMACIST - PHYSICIAN COMMUNICATION  CONCERNING:  Enoxaparin  (Lovenox ) for DVT Prophylaxis    RECOMMENDATION: Patient was prescribed enoxaprin 40mg  q24 hours for VTE prophylaxis.   Filed Weights   06/20/24 1945  Weight: 110.7 kg (244 lb)    Body mass index is 33.09 kg/m.  Estimated Creatinine Clearance: 48.5 mL/min (A) (by C-G formula based on SCr of 2.29 mg/dL (H)).   Based on [facility] policy patient is candidate for enoxaparin  0.5mg /kg TBW SQ every 24 hours based on BMI being >30.   DESCRIPTION: Pharmacy has adjusted enoxaparin  dose per [facility] policy.  Patient is now receiving enoxaparin  60 mg every 24 hours    Kayla JULIANNA Blew, PharmD Clinical Pharmacist  06/21/2024 8:35 AM

## 2024-06-21 NOTE — Inpatient Diabetes Management (Signed)
 Inpatient Diabetes Program Recommendations  AACE/ADA: New Consensus Statement on Inpatient Glycemic Control (2015)  Target Ranges:  Prepandial:   less than 140 mg/dL      Peak postprandial:   less than 180 mg/dL (1-2 hours)      Critically ill patients:  140 - 180 mg/dL   Lab Results  Component Value Date   GLUCAP 213 (H) 06/21/2024   HGBA1C 11.1 (H) 05/15/2024    Review of Glycemic Control  Latest Reference Range & Units 06/21/24 11:47  Glucose-Capillary 70 - 99 mg/dL 786 (H)   Diabetes history: DM  Outpatient Diabetes medications:  Humalog  75/25 14 units bid Current orders for Inpatient glycemic control:  Novolog  70/30 15 units bid Novolog  0-9 units tid with meals and HS  Inpatient Diabetes Program Recommendations:    Note patient is NPO- Consider d/c of 70/30 for now due to NPO status today and tomorrow.  -Instead may consider Lantus  10 units daily and q 4 hour Novolog  sensitive correction (0-9 units).   Thanks,  Randall Bullocks, RN, BC-ADM Inpatient Diabetes Coordinator Pager [phone]  (8a-5p)

## 2024-06-21 NOTE — Consult Note (Signed)
 PODIATRY CONSULTATION  NAME Clayton [name] MRN [id number] DOB Apr 16, 1971 DOA 06/20/2024   Reason for consult:  Chief Complaint  Patient presents with   Foot Pain    Attending/Consulting physician: MYRTIS Mana MD  History of present illness:  Clayton [name] is a 53 y.o. male with medical history significant of IDDM, CKD stage IIIa, HTN, HLD, sent from podiatrist office for evaluation of worsening of left foot infection.   Patient developed a ulcer on bottom of left foot about 1 month ago for which has been following with podiatry for outpatient care as well as p.o. antibiotics Keflex.  Last week, patient started to see some discharges from the wound and went to see podiatry yesterday who sent patient to ED for evaluation of abscess formation in the left foot diabetic ulcer.  Patient denied any pain no fever or chills.  Discussed with patient preoperative area regarding the MRI findings.  He has severe infection on the left foot.  I discussed that we will do what we can to salvage his left foot however I am concerned he is heading down the path towards amputation of the lower extremity given progression of infection despite prior surgical intervention with wound VAC and antibiotic therapy.  Patient became tearful and emotional regarding this information I am surprised that he was not told this previously is a distinct possibility.  Discussed that we are not doing amputation today I am trying to save his foot however I am worried that we are progressing towards a point where if the infection continues to spread there will be no other options.  Past Medical History:  Diagnosis Date   Asthma    CKD (chronic kidney disease), stage III (HCC)    Hypertension    Type II diabetes mellitus with renal manifestations (HCC)        Latest Ref Rng & Units 06/20/2024    7:48 PM 06/02/2024   10:24 AM 05/26/2024    8:44 AM  CBC  WBC 4.0 - 10.5 K/uL 8.2  7.6  10.2   Hemoglobin 13.0 - 17.0 g/dL 87.3  9.1   6.2   Hematocrit 39.0 - 52.0 % 38.5  28.7  20.6   Platelets 150 - 400 K/uL 267  361  436        Latest Ref Rng & Units 06/20/2024    7:48 PM 06/02/2024   10:24 AM 05/26/2024    8:44 AM  BMP  Glucose 70 - 99 mg/dL 657  824  805   BUN 6 - 20 mg/dL 35  18  21   Creatinine 0.61 - 1.24 mg/dL 7.70  7.58  7.70   Sodium 135 - 145 mmol/L 132  138  137   Potassium 3.5 - 5.1 mmol/L 4.4  4.1  4.4   Chloride 98 - 111 mmol/L 102  103  103   CO2 22 - 32 mmol/L 20  23  25    Calcium  8.9 - 10.3 mg/dL 9.2  9.2  8.7       Physical Exam: Lower Extremity Exam  Left foot dressing dry and intact  Exam from yesterday with Dr. Gershon Left foot is edematous.  There is a large fluctuant blister on the plantar aspect of the midfoot.  Once I remove the wound back a significant purulence was expressed from the plantar foot.  Not increased temperature to the foot present. No pain with calf compression, swelling, warmth, erythema   MRI L foot W WO contrast: 1. Compared to  the prior exam dated 05/15/2024, there is a new soft tissue wound along the plantar medial hindfoot/midfoot junction with underlying complex peripherally enhancing fluid collection, most concerning for abscess, measuring approximately 3.2 cm AP x 3.0 cm TR x 2.6 cm CC. This collection appears to communicate with the overlying plantar wound and contains foci of central susceptibility artifact, favored to represent soft tissue air, which could be secondary to recent intervention/instrumentation or gas-forming infection. This collection insinuates from the plantar subcutaneous fat through the underlying deep soft tissues involving the flexor digitorum brevis, abductor digiti minimi, and quadratus plantae muscles with associated myositis and cellulitis. 2. Redemonstrated wound with postoperative changes along the lateral and plantar midfoot/hindfoot junction with surrounding soft tissue edema and enhancement, compatible with cellulitis. No  discrete loculated fluid collection identified in this region. Compared to the prior exam there is markedly increased signal abnormality with enhancement and progressive osteolysis involving the base and proximal shaft of the fifth metatarsal, compatible with osteomyelitis. The fourth metatarsal base demonstrates mildly increased edema like signal abnormality with mild enhancement, which could reflect reactive marrow changes, however, osteomyelitis can not be excluded. 3. New peripherally enhancing collection within the proximal abductor digiti minimi muscle measuring 1.7 x 0.6 x 0.5 cm, abscess is not excluded.   ASSESSMENT/PLAN OF CARE 53 y.o. 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  WBC 8.2 Last A1c 05/15/24: 11.1  L foot MRI as above, plantar midfoot abscess, OM 5th met base, possibly of the 4th met base  - NPO for OR this afternoon for I&D of abscess with partial 5th met base resection. Abx beads, possible wound vac. - Recommend ID consultation with Dr. Fayette, appreciate recs - Informed patient he is at high risk of below knee amputation in near future if infection continues to spread / worsen as it as been despite abx therapy and wound vac. - Continue IV abx broad spectrum pending further culture data - Anticoagulation: hold pending OR - Wound care: Leave surgical dressings c/d/i - WB status: NWB L foot  - Will continue to follow   Thank you for the consult.  Please contact me directly with any questions or concerns.           Marolyn JULIANNA Honour, DPM [facility]    [address] [place], [place] [address]                Office [phone]  Fax [phone]

## 2024-06-21 NOTE — Consult Note (Signed)
 NAME: Clayton [name]  DOB: April 10, 1971  MRN: [id number]  Date/Time: 06/21/2024 11:04 AM  REQUESTING PROVIDER: Dr. Laurita Subjective:  REASON FOR CONSULT: Left foot infection ?  Clayton [name] is a 53 y.o. with a history of hypertension, diabetes, CKD,  Presents to the ED sent in by his podiatrist for left foot abscess Patient has complicated diabetic left foot infection.  He initially presented to the ED On 05/07/2024 with abdominal pain nausea and vomiting and found to be in DKA.  Also a wound was seen on his left foot which started as a blister 2 weeks prior but it was infected and necrotic.  X-ray questioned osteomyelitis and patient underwent debridement on 05/15/2024 and the wound culture was Staphylococcus aureus, Streptococcus mitis and moderate Prevotella   The blood culture was negative.  The bone pathology from the left cuboid was acute osteomyelitis. As patient did not have insurance he did not want to go on IV antibiotics home.  So he was given long-acting dalbavancin on 05/19/2024 and then 05/26/2024.  He was also on Flagyl  500 bid.  I was following him as outpatient and after he completed dalbavancin he went on cefadroxil  500 mg twice daily on 06/02/2024 for 4 weeks.  He is currently on it He followed up with podiatrist and the wound was looking better but at his last visit they found a fluctuant swelling in the middle of the foot on the plantar aspect.  So he was asked to go to the hospital for admission and MRI. He does not have any fever or chills Vitals in the ED on 9-25 was not BP of 218/113 Temperature 98.5 Pulse 77 Respiratory rate 19 Sats 98%  Labs  Latest Reference Range & Units 06/20/24  WBC 4.0 - 10.5 K/uL 8.2  Hemoglobin 13.0 - 17.0 g/dL 87.3 (L)  HCT 60.9 - 47.9 % 38.5 (L)  Platelets 150 - 400 K/uL 267  Creatinine 0.61 - 1.24 mg/dL 7.70 (H)   Blood culture was sent MRI of the left foot showed a new soft tissue wound along the plantar medial hindfoot mid region with  underlying complex peripherally enhancing fluid collection most concerning for abscess measuring 3.6 X3 X 2.6 cm.  Patient has been started on vancomycin  and Zosyn  I am asked to see the patient for management of this infection Patient is going to be taken to the OR by podiatrist for I&D He is currently sitting comfortably in his bed He says he would still not like to do intravenous antibiotics at home and prefers to come to the short stay instead He has some responsibilities at home taking care of his especially with his daughter taking go to school and hence would like to leave the hospital as soon as medically stable.   Past Medical History:  Diagnosis Date   Asthma    CKD (chronic kidney disease), stage III (HCC)    Hypertension    Type II diabetes mellitus with renal manifestations [facility])     Past Surgical History:  Procedure Laterality Date   APPLICATION OF WOUND VAC Left 05/15/2024   Procedure: APPLICATION, WOUND VAC;  Surgeon: Malvin Marsa FALCON, DPM;  Location: [facility] ORS;  Service: Orthopedics/Podiatry;  Laterality: Left;   APPLICATION OF WOUND VAC Left 05/17/2024   Procedure: APPLICATION, WOUND VAC;  Surgeon: Malvin Marsa FALCON, DPM;  Location: [facility] ORS;  Service: Orthopedics/Podiatry;  Laterality: Left;   CATARACT EXTRACTION     GRAFT APPLICATION Left 05/17/2024   Procedure: GRAFT APPLICATION;  Surgeon: Malvin Marsa  F, DPM;  Location: [facility] ORS;  Service: Orthopedics/Podiatry;  Laterality: Left;   IRRIGATION AND DEBRIDEMENT FOOT Left 05/15/2024   Procedure: IRRIGATION AND DEBRIDEMENT FOOT;  Surgeon: Malvin Marsa FALCON, DPM;  Location: [facility] ORS;  Service: Orthopedics/Podiatry;  Laterality: Left;  WITH BONE BIOPSY   IRRIGATION AND DEBRIDEMENT FOOT Left 05/17/2024   Procedure: IRRIGATION AND DEBRIDEMENT FOOT;  Surgeon: Malvin Marsa FALCON, DPM;  Location: [facility] ORS;  Service: Orthopedics/Podiatry;  Laterality: Left;    Social History   Socioeconomic History    Marital status: Married    Spouse name: Not on file   Number of children: Not on file   Years of education: Not on file   Highest education level: Not on file  Occupational History   Not on file  Tobacco Use   Smoking status: Never   Smokeless tobacco: Never  Substance and Sexual Activity   Alcohol use: Yes   Drug use: Not Currently   Sexual activity: Yes  Other Topics Concern   Not on file  Social History Narrative   Not on file   Social Drivers of Health   Financial Resource Strain: Low Risk  (07/07/2023)   Received from [facility]   Overall Financial Resource Strain (CARDIA)    Difficulty of Paying Living Expenses: Not very hard  Food Insecurity: No Food Insecurity (06/21/2024)   Hunger Vital Sign    Worried About Running Out of Food in the Last Year: Never true    Ran Out of Food in the Last Year: Never true  Transportation Needs: No Transportation Needs (06/21/2024)   PRAPARE - Administrator, Civil Service (Medical): No    Lack of Transportation (Non-Medical): No  Physical Activity: Not on file  Stress: Not on file  Social Connections: Not on file  Intimate Partner Violence: Not At Risk (06/21/2024)   Humiliation, Afraid, Rape, and Kick questionnaire    Fear of Current or Ex-Partner: No    Emotionally Abused: No    Physically Abused: No    Sexually Abused: No    Family History  Problem Relation Age of Onset   Stroke Mother    Hypertension Mother    Hypertension Father    No Known Allergies I? Current Facility-Administered Medications  Medication Dose Route Frequency Provider Last Rate Last Admin   acetaminophen  (TYLENOL ) tablet 650 mg  650 mg Oral Q6H PRN Laurita Cort DASEN, MD       Or   acetaminophen  (TYLENOL ) suppository 650 mg  650 mg Rectal Q6H PRN Laurita Cort DASEN, MD       albuterol  (PROVENTIL ) (2.5 MG/3ML) 0.083% nebulizer solution 3 mL  3 mL Inhalation Q6H PRN Laurita Cort T, MD       carvedilol  (COREG ) tablet 25 mg  25 mg Oral  BID Laurita Cort T, MD   25 mg at 06/21/24 1056   enoxaparin  (LOVENOX ) injection 60 mg  60 mg Subcutaneous Q24H Laurita Cort DASEN, MD       insulin  aspart (novoLOG ) injection 0-5 Units  0-5 Units Subcutaneous QHS Laurita Cort T, MD       insulin  aspart (novoLOG ) injection 0-9 Units  0-9 Units Subcutaneous TID WC Zhang, Ping T, MD       insulin  aspart protamine- aspart (NOVOLOG  MIX 70/30) injection 15 Units  15 Units Subcutaneous BID WC Tobie Myron M, RPH       lactated ringers  infusion   Intravenous Continuous Bradler, Evan K, MD 150 mL/hr at 06/21/24 0240  New Bag at 06/21/24 0240   losartan  (COZAAR ) tablet 100 mg  100 mg Oral Daily Laurita Manor T, MD   100 mg at 06/21/24 1056   ondansetron  (ZOFRAN ) tablet 4 mg  4 mg Oral Q6H PRN Laurita Manor DASEN, MD       Or   ondansetron  (ZOFRAN ) injection 4 mg  4 mg Intravenous Q6H PRN Laurita Manor DASEN, MD       piperacillin -tazobactam (ZOSYN ) IVPB 3.375 g  3.375 g Intravenous Q8H Tobie Ransom HERO, RPH 12.5 mL/hr at 06/21/24 0841 3.375 g at 06/21/24 0841   [START ON 06/22/2024] vancomycin  (VANCOREADY) IVPB 1500 mg/300 mL  1,500 mg Intravenous Q24H Tobie Ransom M, RPH         Abtx:  Anti-infectives (From admission, onward)    Start     Dose/Rate Route Frequency Ordered Stop   06/22/24 0200  vancomycin  (VANCOREADY) IVPB 1500 mg/300 mL        1,500 mg 150 mL/hr over 120 Minutes Intravenous Every 24 hours 06/21/24 0854     06/21/24 0900  piperacillin -tazobactam (ZOSYN ) IVPB 3.375 g        3.375 g 12.5 mL/hr over 240 Minutes Intravenous Every 8 hours 06/21/24 0808     06/20/24 2345  cefTRIAXone  (ROCEPHIN ) 2 g in sodium chloride  0.9 % 100 mL IVPB        2 g 200 mL/hr over 30 Minutes Intravenous Once 06/20/24 2340 06/21/24 0055   06/20/24 2345  vancomycin  (VANCOCIN ) IVPB 1000 mg/200 mL premix  Status:  Discontinued        1,000 mg 200 mL/hr over 60 Minutes Intravenous  Once 06/20/24 2340 06/20/24 2343   06/20/24 2345  vancomycin  (VANCOREADY) IVPB 2000 mg/400 mL         2,000 mg 200 mL/hr over 120 Minutes Intravenous  Once 06/20/24 2343 06/21/24 0436       REVIEW OF SYSTEMS:  Const: negative fever, negative chills, negative weight loss Eyes: negative diplopia or visual changes, negative eye pain ENT: negative coryza, negative sore throat Resp: negative cough, hemoptysis, dyspnea Cards: negative for chest pain, palpitations, lower extremity edema GU: negative for frequency, dysuria and hematuria GI: Negative for abdominal pain, diarrhea, bleeding, constipation Skin: negative for rash and pruritus Heme: negative for easy bruising and gum/nose bleeding MS: As above.  He is still not he still nonweightbearing.  He uses a knee scooter for the left side Neurolo:negative for headaches, dizziness, vertigo, memory problems  Psych: negative for feelings of anxiety, depression  Endocrine: , diabetes Allergy/Immunology- negative for any medication or food allergies ?  Objective:  VITALS:  BP (!) 165/81 (BP Location: Left Arm)   Pulse 72   Temp 98.4 F (36.9 C) (Oral)   Resp 16   Ht 6' (1.829 m)   Wt 110.7 kg   SpO2 98%   BMI 33.09 kg/m   PHYSICAL EXAM:  General: Alert, cooperative, no distress, appears stated age.  Head: Normocephalic, without obvious abnormality, atraumatic. Eyes: Conjunctivae clear, anicteric sclerae. Pupils are equal ENT Nares normal. No drainage or sinus tenderness. Lips, mucosa, and tongue normal. No Thrush Neck: Supple, symmetrical, no adenopathy, thyroid: non tender no carotid bruit and no JVD. Back: No CVA tenderness. Lungs: Clear to auscultation bilaterally. No Wheezing or Rhonchi. No rales. Heart: Regular rate and rhythm, no murmur, rub or gallop. Abdomen: Soft, non-tender,not distended. Bowel sounds normal. No masses Extremities: Left foot pictures examined He has got a dressing and is going to go to the OR  soon     Skin: No rashes or lesions. Or bruising Lymph: Cervical, supraclavicular normal. Neurologic:  Grossly non-focal Pertinent Labs Lab Results CBC    Component Value Date/Time   WBC 8.2 06/20/2024 1948   RBC 4.06 (L) 06/20/2024 1948   HGB 12.6 (L) 06/20/2024 1948   HCT 38.5 (L) 06/20/2024 1948   PLT 267 06/20/2024 1948   MCV 94.8 06/20/2024 1948   MCH 31.0 06/20/2024 1948   MCHC 32.7 06/20/2024 1948   RDW 13.0 06/20/2024 1948   LYMPHSABS 1.5 06/20/2024 1948   MONOABS 0.5 06/20/2024 1948   EOSABS 0.2 06/20/2024 1948   BASOSABS 0.1 06/20/2024 1948       Latest Ref Rng & Units 06/20/2024    7:48 PM 06/02/2024   10:24 AM 05/26/2024    8:44 AM  CMP  Glucose 70 - 99 mg/dL 657  824  805   BUN 6 - 20 mg/dL 35  18  21   Creatinine 0.61 - 1.24 mg/dL 7.70  7.58  7.70   Sodium 135 - 145 mmol/L 132  138  137   Potassium 3.5 - 5.1 mmol/L 4.4  4.1  4.4   Chloride 98 - 111 mmol/L 102  103  103   CO2 22 - 32 mmol/L 20  23  25    Calcium  8.9 - 10.3 mg/dL 9.2  9.2  8.7   Total Protein 6.5 - 8.1 g/dL  7.4  6.7   Total Bilirubin 0.0 - 1.2 mg/dL  0.8  0.8   Alkaline Phos 38 - 126 U/L  77  67   AST 15 - 41 U/L  26  43   ALT 0 - 44 U/L  26  39       Microbiology: Recent Results (from the past 240 hours)  MRSA Next Gen by PCR, Nasal     Status: None   Collection Time: 06/21/24  7:48 AM   Specimen: Nasal Mucosa; Nasal Swab  Result Value Ref Range Status   MRSA by PCR Next Gen NOT DETECTED NOT DETECTED Final    Comment: (NOTE) The GeneXpert MRSA Assay (FDA approved for NASAL specimens only), is one component of a comprehensive MRSA colonization surveillance program. It is not intended to diagnose MRSA infection nor to guide or monitor treatment for MRSA infections. Test performance is not FDA approved in patients less than 34 years old. Performed at [facility], [address] [place], [place] [address]     Lines and Device Date on insertion # of days DC  Central line     Foley     ETT       IMAGING RESULTS:  I have personally reviewed the films Soft tissue  abscess in the midfoot  Impression/Recommendation Left foot diabetic foot infection: Ongoing infection since July. Underwent debridement in July and cultures had Staph aureus, Streptococcus, Prevotella.  Cuboid bone biopsy was acute osteomyelitis Patient received 2 doses of delete that patient received 2 doses of weekly dalbavancin which would have covered him for 4 weeks and then he went on p.o. cefadroxil  and is still on it.  He was also on p.o. Flagyl  Now he has a new abscess in the wound and the old lesion is healing well MRI showed an abscess and is going for I&D today He is currently on vancomycin  and Zosyn .  Because of CKD will not do this combination.  Will change vancomycin  to daptomycin .  CKD Because of this would not give the Vanco Zosyn   combination will change to daptomycin .  Anemia?today it is 12.6 During last hospitalization his hemoglobin was as low as 6.2  Diabetes mellitus on insulin  management as per primary team  This consult involved complex antimicrobial management. _I have personally spent  -60--minutes involved in face-to-face and non-face-to-face activities for this patient on the day of the visit. Professional time spent includes the following activities: Preparing to see the patient (review of tests), Obtaining and/or reviewing separately obtained history (admission/discharge record), Performing a medically appropriate examination and/or evaluation , Ordering medications/tests/procedures, referring and communicating with other health care professionals, Documenting clinical information in the EMR, Independently interpreting results (not separately reported), Communicating results to the patient/family/caregiver, Counseling and educating the patient/family/caregiver and Care coordination (not separately reported).    ________________________________________________ Discussed with patient, requesting provider Note:  This document was prepared using Dragon voice  recognition software and may include unintentional dictation errors.

## 2024-06-21 NOTE — Consult Note (Signed)
 Pharmacy Antibiotic Note  Clayton [name] is a 53 y.o. male admitted on 06/20/2024 with left foot wound infection.  PMH significant for CKD, type 2 diabetes, poorly controlled hypertension and chronic left foot wound. Patient presented to ED due to a new lesion and purulent drainage to the sole of the left foot that was found during a podiatry appointment on 9/2. Patient currently on Cefadroxil  outpatient and recently completed a course of Flagyl . Pharmacy has been consulted for Vancomycin  and Zosyn  dosing.  9/3: Patient will be started on IV antibiotics and get an MRI. Will also need surgical debridement. Patient has no leukocytosis and is afebrile. Renal function is stable with Scr of 2.29 at baseline of 1.7-3. Patient received Vancomycin  2,000 mg IV loading dose, Ceftriaxone  x 1, and Zosyn  x 1 in ED. Will adjust antibiotics as needed and continue to monitor.  Plan: - Received Vancomycin  2000 g IV loading dose x 1 - Will start vancomycin  maintenance dose at 1500 mg q24h (eAUC 485.3, Cmin 13, Scr 2.29, IBW used, Vd 0.72 L/kg) - Goal AUC 400-550 - Will continue Zosyn  3.375 g q8h - Will continue to monitor renal function and signs of clinical improvement   Height: 6' (182.9 cm) Weight: 110.7 kg (244 lb) IBW/kg (Calculated) : 77.6  Temp (24hrs), Avg:98.2 F (36.8 C), Min:97.8 F (36.6 C), Max:98.5 F (36.9 C)  Recent Labs  Lab 06/20/24 1948  WBC 8.2  CREATININE 2.29*  LATICACIDVEN 1.8    Estimated Creatinine Clearance: 48.5 mL/min (A) (by C-G formula based on SCr of 2.29 mg/dL (H)).    No Known Allergies  Antimicrobials this admission: 9/3 CRO x 1 9/3 vancomycin  >> 9/3 Zosyn  >>   Dose adjustments this admission:   Microbiology results: 9/3 BCx: ordered 9/3 MRSA PCR: pending  Thank you for allowing pharmacy to be a part of this patient's care.  Ransom Blanch PGY-1 Pharmacy Resident  [facility]  06/21/2024 8:48 AM

## 2024-06-21 NOTE — Progress Notes (Signed)
 CHG done

## 2024-06-21 NOTE — Anesthesia Preprocedure Evaluation (Signed)
 Anesthesia Evaluation  Patient identified by MRN, date of birth, ID band Patient awake    Reviewed: Allergy & Precautions, NPO status , Patient's Chart, lab work & pertinent test results  History of Anesthesia Complications Negative for: history of anesthetic complications  Airway Mallampati: III  TM Distance: <3 FB Neck ROM: full    Dental  (+) Chipped, Poor Dentition   Pulmonary neg shortness of breath, asthma    Pulmonary exam normal        Cardiovascular Exercise Tolerance: Good hypertension, (-) angina Normal cardiovascular exam     Neuro/Psych  negative psych ROS   GI/Hepatic negative GI ROS, Neg liver ROS,neg GERD  ,,  Endo/Other  diabetes, Type 2    Renal/GU Renal disease  negative genitourinary   Musculoskeletal   Abdominal   Peds  Hematology negative hematology ROS (+)   Anesthesia Other Findings Past Medical History: No date: Asthma No date: CKD (chronic kidney disease), stage III (HCC) No date: Hypertension No date: Type II diabetes mellitus with renal manifestations (HCC)  Past Surgical History: 05/15/2024: APPLICATION OF WOUND VAC; Left     Comment:  Procedure: APPLICATION, WOUND VAC;  Surgeon: Malvin Marsa FALCON, DPM;  Location: [facility] OR[facility];  Service:               Orthopedics/Podiatry;  Laterality: Left; 05/17/2024: APPLICATION OF WOUND VAC; Left     Comment:  Procedure: APPLICATION, WOUND VAC;  Surgeon: Malvin Marsa FALCON, DPM;  Location: [facility];  Service:               Orthopedics/Podiatry;  Laterality: Left; No date: CATARACT EXTRACTION 05/17/2024: GRAFT APPLICATION; Left     Comment:  Procedure: GRAFT APPLICATION;  Surgeon: Malvin Marsa FALCON, DPM;  Location: [facility];  Service:               Orthopedics/Podiatry;  Laterality: Left; 05/15/2024: IRRIGATION AND DEBRIDEMENT FOOT; Left     Comment:  Procedure: IRRIGATION AND DEBRIDEMENT  FOOT;  Surgeon:               Malvin Marsa FALCON, DPM;  Location: [facility];                Service: Orthopedics/Podiatry;  Laterality: Left;  WITH               BONE BIOPSY 05/17/2024: IRRIGATION AND DEBRIDEMENT FOOT; Left     Comment:  Procedure: IRRIGATION AND DEBRIDEMENT FOOT;  Surgeon:               Malvin Marsa FALCON, DPM;  Location: [facility];                Service: Orthopedics/Podiatry;  Laterality: Left;  BMI    Body Mass Index: 33.09 kg/m      Reproductive/Obstetrics negative OB ROS                              Anesthesia Physical Anesthesia Plan  ASA: 3  Anesthesia Plan: General   Post-op Pain Management:    Induction: Intravenous  PONV Risk Score and Plan: Propofol  infusion and TIVA  Airway Management Planned: Natural Airway and Nasal Cannula  Additional Equipment:   Intra-op Plan:  Post-operative Plan:   Informed Consent: I have reviewed the patients History and Physical, chart, labs and discussed the procedure including the risks, benefits and alternatives for the proposed anesthesia with the patient or authorized representative who has indicated his/her understanding and acceptance.     Dental Advisory Given  Plan Discussed with: Anesthesiologist, CRNA and Surgeon  Anesthesia Plan Comments: (Patient consented for risks of anesthesia including but not limited to:  - adverse reactions to medications - risk of airway placement if required - damage to eyes, teeth, lips or other oral mucosa - nerve damage due to positioning  - sore throat or hoarseness - Damage to heart, brain, nerves, lungs, other parts of body or loss of life  Patient voiced understanding and assent.)        Anesthesia Quick Evaluation

## 2024-06-21 NOTE — Progress Notes (Signed)
 Pharmacy Antibiotic Note  Clayton [name] is a 53 y.o. male admitted on 06/20/2024 with left foot wound infection. PMH significant for CKD, type 2 diabetes, poorly controlled hypertension and chronic left foot wound. Patient presented to ED due to a new lesion and purulent drainage to the sole of the left foot that was found during a podiatry appointment on 06/20/24. Patient was started on Zosyn  and Vancomycin  while in ED. Per ID, will switch vancomycin  to daptomycin  due to renal function.   Patient remains afebrile with WBC 8.2 s/p I&D of L foot abscess and partial 5th metatarsal resection in OR today (06/21/24). Cultures were taken in OR. Scr 2.29 (BL ~2.3-3.0).  Plan: Vancomycin  load of 2000 mg IV x1 given this morning Switch to daptomycin  700 mg IV every 24 hours (~8 mg/kg, used adjusted body weight due to BMI > 30) Continue to monitor renal function, clinical status, culture data, and antibiotic LOT F/u CK weekly while on daptomycin  F/u OR cultures  Height: 6' (182.9 cm) Weight: 110.7 kg (244 lb) IBW/kg (Calculated) : 77.6  Temp (24hrs), Avg:97.8 F (36.6 C), Min:97 F (36.1 C), Max:98.5 F (36.9 C)  Recent Labs  Lab 06/20/24 1948  WBC 8.2  CREATININE 2.29*  LATICACIDVEN 1.8    Estimated Creatinine Clearance: 48.5 mL/min (A) (by C-G formula based on SCr of 2.29 mg/dL (H)).    No Known Allergies  Antimicrobials this admission: Zosyn  9/3 >>  Daptomycin  9/3 >> Ceftriaxone  9/3 x1 vancomycin  9/3 x1  Dose adjustments this admission: N/A  Microbiology results: 9/3 BCx: pending 9/3 Wound cx: pending 9/3 MRSA PCR: Neg  Thank you for involving pharmacy in this patient's care.   Damien Napoleon, PharmD Clinical Pharmacist 06/21/2024 6:31 PM

## 2024-06-21 NOTE — Op Note (Signed)
 Full Operative Report  Date of Operation: 3:20 PM, 06/21/2024   Patient: Clayton [name] - 53 y.o. 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  Incision and drainage of abscess plantar midfoot below fascia level single site 2.  Partial fifth metatarsal base resection, left foot 3.  Bone biopsy fourth metatarsal base via trocar deep, left foot 4. Application negative pressure wound therapy  10 x 4 cm, left foot    Anesthesia: General  Piscitello, Fairy POUR, MD  Anesthesiologist: Piscitello, Fairy POUR, MD CRNA: Niemzyk, Haiyun, CRNA   Estimated Blood Loss: 50 mL  Hemostasis: 1) Anatomical dissection, mechanical compression, electrocautery 2) no tourniquet was used during procedure  Implants: * No implants in log *  Materials: Prolene 2-0, wound VAC black sponge  Injectables: 1) Pre-operatively: 20 cc of 50:50 mixture 1%lidocaine  plain and 0.5% marcaine  plain 2) Post-operatively: None   Specimens: - Pathology: Fifth metatarsal base with distal margin inked, separately fourth metatarsal base bone biopsy - Microbiology: Plantar midfoot abscess swab culture, separately deep tissue culture plantar midfoot wound, separately fifth metatarsal base bone for culture   Antibiotics: IV antibiotics given per schedule on the floor  Drains: None  Complications: Patient tolerated the procedure well without complication.   Operative findings: As below in detailed report  Indications for Procedure: Arvel Oquinn presents to Malvin Marsa FALCON, DPM with a chief complaint of draining ulceration lateral midfoot with concern for underlying osteomyelitis on MRI as well as concern for new abscess in the plantar midfoot.  The patient has failed conservative treatments of various modalities. At this time the patient has elected to proceed with surgical correction. All alternatives, risks, and  complications of the procedures were thoroughly explained to the patient. Patient exhibits appropriate understanding of all discussion points and informed consent was signed and obtained in the chart with no guarantees to surgical outcome given or implied.  Description of Procedure: Patient was brought to the operating room. Patient remained on their hospital bed in the supine position. A surgical timeout was performed and all members of the operating room, the procedure, and the surgical site were identified. anesthesia occurred as per anesthesia record. Local anesthetic as previously described was then injected about the operative field in a local infiltrative block.  The operative lower extremity as noted above was then prepped and draped in the usual sterile manner. The following procedure then began.  Attention was directed to the LEFT lower extremity. Using a #15 blade, a linear incision was made overlying the plantar soft tissues of the fluctuant area. Sharp and blunt dissection was carried deep to subcutaneous tissues, being careful to protect all neurovascular structures. At this time, approximately 5-10 mL of purulent fluid was evacuated from the incision.  A abscess swab culture was obtained. Manual pressure was  applied to evacuate any remaining fluids. Using a hemostat, the subcutaneous tissue layers were explored for any sinus tracts, gross debris, and to free up any remaining fluid matter.  A rongeur was used to harvest a separate deep tissue culture that consisted of necrotic fat tissue the wound did  probe to bone, capsule, tendon, or other deep structures. 1000ml of saline was used under power pulse lavage to irrigate the surgical site.   I then directed attention to the lateral aspect of the foot at the site of the large ulceration that had partially healed and with wound VAC therapy.  There  was noted to be direct extension of the central portion of the wound down to the fifth metatarsal  base.  I debrided the wound with rongeur to healthy bleeding level.  I then used a 15 blade to make a longitudinal incision on the lateral aspect of the fifth metatarsal base.  I then used a 15 blade to expose the dorsal and plantar aspect fifth metatarsal base and disarticulated at the fifth TMT J.  Sagittal saw was then used to make a transverse osteotomy through the midshaft area of the fifth metatarsal base.  The base was then resected in total.  This was passed the back table and sent for pathology.  The distal margin was inked.  A bone culture was harvested from the fifth metatarsal base with rongeur and passed the back table sent for aerobic interval culture.  The bone at the base was noted to be slightly necrotic consistent with osteomyelitis.  The surgical site was then irrigated thoroughly with 2 L of sterile saline via power pulse lavage.  The wound was partially closed after hemostasis achieved with electrocautery with 2-0 Prolene on the distal aspect.  Attention was then directed to the fourth metatarsal base which was infusible through the incision after the removal of the fifth met base.  A Jamshidi needle was then used to harvest a single core of bone sample from the fourth metatarsal base laterally.  This was sent for pathology.  Next decision made to apply wound VAC therapy given the depth of the ulceration.  The combined total of the wounds present both laterally and plantarly measured approximately 10 x 4 x 2 cm.  Wound VAC black sponge was cut to the above measurements in place within the plantar midfoot ulceration as well as the lateral ulceration.  This was then covered with derma tack wound VAC drape.  Wound VAC was set at 125 mm Hg of continuous suction with no leaks detected.  The surgical site was then dressed with 4 x 4 gauze Kerlix Ace roll. The patient tolerated both the procedure and anesthesia well with vital signs stable throughout. The patient was transferred in good condition  and all vital signs stable  from the OR to recovery under the discretion of anesthesia.  Condition: Vital signs stable, neurovascular status unchanged from preoperative   Surgical plan:  Await pathology and culture data.  Appreciate ID for their recommendations regarding long-term antibiotic therapy.  Patient remains at high risk as before for left lower extremity below the knee amputation.  He has been advised of this.  I do not feel there is much more that can be done surgically from a foot and ankle perspective to salvage his foot other than proceed with long-term wound VAC therapy and local wound care.  If he progresses in regards to his osteomyelitis or develops further abscesses will require below the knee amputation.  Nonweightbearing and postop shoe for time being.  First VAC change on Friday.  The patient will be nonweightbearing in a postop shoe to the operative limb until further instructed. The dressing is to remain clean, dry, and intact. Will continue to follow unless noted elsewhere.   Marsa Honour, DPM [facility]

## 2024-06-22 ENCOUNTER — Other Ambulatory Visit: Payer: Self-pay

## 2024-06-22 ENCOUNTER — Encounter: Payer: Self-pay | Admitting: Podiatry

## 2024-06-22 ENCOUNTER — Ambulatory Visit: Payer: Self-pay | Admitting: Podiatry

## 2024-06-22 LAB — CBC
HCT: 31.2 % — ABNORMAL LOW (ref 39.0–52.0)
Hemoglobin: 10.3 g/dL — ABNORMAL LOW (ref 13.0–17.0)
MCH: 31.6 pg (ref 26.0–34.0)
MCHC: 33 g/dL (ref 30.0–36.0)
MCV: 95.7 fL (ref 80.0–100.0)
Platelets: 212 K/uL (ref 150–400)
RBC: 3.26 MIL/uL — ABNORMAL LOW (ref 4.22–5.81)
RDW: 13.2 % (ref 11.5–15.5)
WBC: 8.3 K/uL (ref 4.0–10.5)
nRBC: 0 % (ref 0.0–0.2)

## 2024-06-22 LAB — BASIC METABOLIC PANEL WITH GFR
Anion gap: 8 (ref 5–15)
BUN: 26 mg/dL — ABNORMAL HIGH (ref 6–20)
CO2: 23 mmol/L (ref 22–32)
Calcium: 8.6 mg/dL — ABNORMAL LOW (ref 8.9–10.3)
Chloride: 105 mmol/L (ref 98–111)
Creatinine, Ser: 2.2 mg/dL — ABNORMAL HIGH (ref 0.61–1.24)
GFR, Estimated: 35 mL/min — ABNORMAL LOW (ref >60)
Glucose, Bld: 164 mg/dL — ABNORMAL HIGH (ref 70–99)
Potassium: 4 mmol/L (ref 3.5–5.1)
Sodium: 136 mmol/L (ref 135–145)

## 2024-06-22 LAB — GLUCOSE, CAPILLARY
Glucose-Capillary: 173 mg/dL — ABNORMAL HIGH (ref 70–99)
Glucose-Capillary: 228 mg/dL — ABNORMAL HIGH (ref 70–99)
Glucose-Capillary: 102 mg/dL — ABNORMAL HIGH (ref 70–99)
Glucose-Capillary: 118 mg/dL — ABNORMAL HIGH (ref 70–99)

## 2024-06-22 LAB — HEMOGLOBIN A1C
Hgb A1c MFr Bld: 6.4 % — ABNORMAL HIGH (ref 4.8–5.6)
Mean Plasma Glucose: 137 mg/dL

## 2024-06-22 LAB — C-REACTIVE PROTEIN: CRP: 0.6 mg/dL (ref <1.0)

## 2024-06-22 MED ORDER — INSULIN ASPART PROT & ASPART (70-30 MIX) 100 UNIT/ML ~~LOC~~ SUSP
20.0000 [IU] | Freq: Two times a day (BID) | SUBCUTANEOUS | Status: DC
  Administered 2024-06-22 – 2024-06-23 (×3): 20 [IU] via SUBCUTANEOUS
  Filled 2024-06-22: qty 10

## 2024-06-22 NOTE — Inpatient Diabetes Management (Addendum)
 Inpatient Diabetes Program Recommendations  AACE/ADA: New Consensus Statement on Inpatient Glycemic Control   Target Ranges:  Prepandial:   less than 140 mg/dL      Peak postprandial:   less than 180 mg/dL (1-2 hours)      Critically ill patients:  140 - 180 mg/dL    Latest Reference Range & Units 06/21/24 11:47 06/21/24 13:49 06/21/24 15:20 06/21/24 16:14 06/21/24 19:41 06/22/24 07:45 06/22/24 11:50  Glucose-Capillary 70 - 99 mg/dL 786 (H) 805 (H) 804 (H) 189 (H) 147 (H) 173 (H) 228 (H)    Review of Glycemic Control  Diabetes history: DM2 Outpatient Diabetes medications: Humalog  75/25 20 units bid  Current orders for Inpatient glycemic control: Novolog  70/30 20 units BID with meals, Novolog  0-9 units TID with meals, and Novolog  0-5 units at bedtime.   Inpatient Diabetes Program Recommendations: Spoke with pt and pt's spouse in room. Confirmed he was taking Humalog  75/25 20 units BID at home prior to admission. He does wear Freestyle Libre3 and monitors his blood sugar on his mobile device. Provided education and stressed the need to keep blood sugar/A1c under well control to promote healing and prevent further complications with his foot. He is aware of the need to become established with PCP/Endocrinologist outpatient once discharged.  Pt did not receive Novolog  70/30 15 units until 1221 today.   Insulin : Would recommend continuing current regimen - Noted 70/30 was increased to 20 units and will start this dose tonight with dinner.   Thanks,  Lavanda Search, RN, MSN, [facility]  Inpatient Diabetes Coordinator  Pager [phone] (8a-5p)

## 2024-06-22 NOTE — Progress Notes (Signed)
 Date of Admission:  06/20/2024    ID: Clayton [name] is a 53 y.o. male  Principal Problem:   Cellulitis and abscess of toe of left foot Active Problems:   Foot ulcer (HCC)   Abscess of left foot    Subjective: Patient is doing fine Wife at bedside He would like to go on IV antibiotics when he is discharged home and wife concurs with that  Medications:   carvedilol   25 mg Oral BID   enoxaparin  (LOVENOX ) injection  60 mg Subcutaneous Q24H   insulin  aspart  0-5 Units Subcutaneous QHS   insulin  aspart  0-9 Units Subcutaneous TID WC   insulin  aspart protamine- aspart  20 Units Subcutaneous BID WC   losartan   100 mg Oral Daily    Objective: Vital signs in last 24 hours: Patient Vitals for the past 24 hrs:  BP Temp Temp src Pulse Resp SpO2  06/22/24 1041 (!) 165/143 98.4 F (36.9 C) Oral 69 16 96 %  06/22/24 0348 (!) 144/69 98.3 F (36.8 C) Oral 77 16 98 %  06/21/24 1943 (!) 151/85 98.3 F (36.8 C) Oral 61 16 97 %  06/21/24 1610 (!) 150/82 98 F (36.7 C) Oral 69 18 100 %  06/21/24 1545 (!) 154/95 -- -- 67 13 98 %  06/21/24 1520 129/72 (!) 97 F (36.1 C) -- 73 20 100 %  06/21/24 1518 -- (!) 97 F (36.1 C) -- 74 -- --  06/21/24 1355 (!) 161/90 97.9 F (36.6 C) Temporal 60 15 99 %       PHYSICAL EXAM:  General: Alert, cooperative, no distress, appears stated age.  Head: Normocephalic, without obvious abnormality, atraumatic. Eyes: Conjunctivae clear, anicteric sclerae. Pupils are equal ENT Nares normal. No drainage or sinus tenderness. Lips, mucosa, and tongue normal. No Thrush Neck: Supple, symmetrical, no adenopathy, thyroid: non tender no carotid bruit and no JVD. Back: No CVA tenderness. Lungs: Clear to auscultation bilaterally. No Wheezing or Rhonchi. No rales. Heart: Regular rate and rhythm, no murmur, rub or gallop. Abdomen: Soft, non-tender,not distended. Bowel sounds normal. No masses Extremities: Left foot has wound VAC Skin: No rashes or lesions. Or  bruising Lymph: Cervical, supraclavicular normal. Neurologic: Grossly non-focal  Lab Results    Latest Ref Rng & Units 06/22/2024    3:59 AM 06/20/2024    7:48 PM 06/02/2024   10:24 AM  CBC  WBC 4.0 - 10.5 K/uL 8.3  8.2  7.6   Hemoglobin 13.0 - 17.0 g/dL 89.6  87.3  9.1   Hematocrit 39.0 - 52.0 % 31.2  38.5  28.7   Platelets 150 - 400 K/uL 212  267  361        Latest Ref Rng & Units 06/22/2024    3:59 AM 06/20/2024    7:48 PM 06/02/2024   10:24 AM  CMP  Glucose 70 - 99 mg/dL 835  657  824   BUN 6 - 20 mg/dL 26  35  18   Creatinine 0.61 - 1.24 mg/dL 7.79  7.70  7.58   Sodium 135 - 145 mmol/L 136  132  138   Potassium 3.5 - 5.1 mmol/L 4.0  4.4  4.1   Chloride 98 - 111 mmol/L 105  102  103   CO2 22 - 32 mmol/L 23  20  23    Calcium  8.9 - 10.3 mg/dL 8.6  9.2  9.2   Total Protein 6.5 - 8.1 g/dL   7.4   Total Bilirubin 0.0 -  1.2 mg/dL   0.8   Alkaline Phos 38 - 126 U/L   77   AST 15 - 41 U/L   26   ALT 0 - 44 U/L   26       Microbiology: Surgical cultures are pending. Studies/Results: DG Foot 2 Views Left Result Date: 06/21/2024 CLINICAL DATA:  Postop. EXAM: LEFT FOOT - 2 VIEW COMPARISON:  06/20/2024 FINDINGS: Resection of the proximal fifth metatarsal. Overlying wound VAC in place. Cortical thickening at the base of the fourth metatarsal. Stable foot alignment. IMPRESSION: Resection of the proximal fifth metatarsal. Electronically Signed   By: Andrea Gasman M.D.   On: 06/21/2024 16:58   MR FOOT LEFT W WO CONTRAST Result Date: 06/21/2024 CLINICAL DATA:  Plantar foot ulcer/wound status post irrigation and surgical debridement with purulent abscess of the left plantar midfoot. EXAM: MRI OF THE LEFT FOREFOOT WITHOUT AND WITH CONTRAST TECHNIQUE: Multiplanar, multisequence MR imaging of the left foot was performed both before and after administration of intravenous contrast. CONTRAST:  10mL GADAVIST  GADOBUTROL  1 MMOL/ML IV SOLN COMPARISON:  MRI of the left foot dated 05/15/2024. Left foot  radiographs dated 06/20/2024. FINDINGS: Compared to the prior exam dated 05/15/2024, there is a new soft tissue wound along the plantar medial hindfoot/midfoot junction with underlying complex peripherally enhancing fluid collection, most concerning for abscess, measuring approximately 3.2 cm AP x 3.0 cm TR x 2.6 cm craniocaudal (series 18, images 15 and 16 series 15, image 11). This collection appears to communicate with the overlying plantar wound and contains foci of central susceptibility artifact, favored to represent soft tissue air, which could be secondary to intervention/instrumentation or gas-forming infection. This collection extends from the plantar subcutaneous fat insinuates through the underlying deep soft tissues involving the flexor digitorum brevis, abductor digiti minimi, and quadratus plantae muscles with marked associated edema and enhancement, compatible with myositis (series 18, image 14). New peripherally enhancing T2 hyperintense collection within the proximal aspect of the abductor digiti minimi muscle measures 1.7 cm AP x 0.6 cm TR by 0.5 cm craniocaudal (series 17, image 26 and series 13, image 22), abscess is not excluded. Redemonstrated wound with postoperative changes along the lateral and plantar midfoot/hindfoot junction with surrounding soft tissue edema and enhancement. No discrete loculated fluid collection identified in this region. Compared to the prior exam there is markedly increased T2 hyperintense signal abnormality and corresponding T1 hypointensity and enhancement with progressive osteolysis involving the base and proximal shaft of the fifth metatarsal, compatible with osteomyelitis (series 12 and 17, image 27). The fourth metatarsal base demonstrates mildly increased T2 hyperintense signal abnormality with mild enhancement, which could reflect reactive marrow changes, however, osteomyelitis can not be excluded. Degenerative arthropathy of the second, third, and fourth TMT  joints. IMPRESSION: 1. Compared to the prior exam dated 05/15/2024, there is a new soft tissue wound along the plantar medial hindfoot/midfoot junction with underlying complex peripherally enhancing fluid collection, most concerning for abscess, measuring approximately 3.2 cm AP x 3.0 cm TR x 2.6 cm CC. This collection appears to communicate with the overlying plantar wound and contains foci of central susceptibility artifact, favored to represent soft tissue air, which could be secondary to recent intervention/instrumentation or gas-forming infection. This collection insinuates from the plantar subcutaneous fat through the underlying deep soft tissues involving the flexor digitorum brevis, abductor digiti minimi, and quadratus plantae muscles with associated myositis and cellulitis. 2. Redemonstrated wound with postoperative changes along the lateral and plantar midfoot/hindfoot junction with surrounding soft tissue edema  and enhancement, compatible with cellulitis. No discrete loculated fluid collection identified in this region. Compared to the prior exam there is markedly increased signal abnormality with enhancement and progressive osteolysis involving the base and proximal shaft of the fifth metatarsal, compatible with osteomyelitis. The fourth metatarsal base demonstrates mildly increased edema like signal abnormality with mild enhancement, which could reflect reactive marrow changes, however, osteomyelitis can not be excluded. 3. New peripherally enhancing collection within the proximal abductor digiti minimi muscle measuring 1.7 x 0.6 x 0.5 cm, abscess is not excluded. Electronically Signed   By: Harrietta Sherry M.D.   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  Cuboid bone biopsy was acute osteomyelitis.  As patient at that time did not want IV  antibiotics because of lack of insurance he was given 2 doses of weekly dalbavancin  and then was placed on cefadroxil  for 4 more weeks.  He also received Flagyl . The primary wound and ulcer is doing much better but he now has a new abscess in the foot He underwent I&D yesterday and cultures have been sent He is currently on v daptomycin  and Zosyn  pending culture results This time he will go on IV antibiotics Home infusion He is willing to get a PICC line and learn to do the IV antibiotics at home Can get a PICC tomorrow Antibiotic choice would be after getting culture results or empirically can do daptomycin  and ertapenem till the culture is available recommended will be after getting culture results.  Diabetes mellitus with peripheral neuropathy  CKD will avoid vancomycin     Discussed management with the patient and his wife at bedside.

## 2024-06-22 NOTE — Progress Notes (Signed)
 [facility]  - [place] [facility]   PATIENT NAME: Clayton [name]    MR#:  [id number]  DATE OF BIRTH:  1971-04-28  SUBJECTIVE:  no family at bedside. Patient sitting out in the recliner. Underwent diabetic foot ulcer surgery on the left foot yesterday by podiatry    VITALS:  Blood pressure (!) 165/143, pulse 69, temperature 98.4 F (36.9 C), temperature source Oral, resp. rate 16, height 6' (1.829 m), weight 110.7 kg, SpO2 96%.  PHYSICAL EXAMINATION:   GENERAL:  53 y.o.-year-old patient with no acute distress.  LUNGS: Normal breath sounds bilaterally, no wheezing CARDIOVASCULAR: S1, S2 normal. No murmur   ABDOMEN: Soft, nontender, nondistended. Bowel sounds present.  EXTREMITIES:  on admission NEUROLOGIC: nonfocal  patient is alert and awake SKIN: as above  LABORATORY PANEL:  CBC Recent Labs  Lab 06/22/24 0359  WBC 8.3  HGB 10.3*  HCT 31.2*  PLT 212    Chemistries  Recent Labs  Lab 06/22/24 0359  NA 136  K 4.0  CL 105  CO2 23  GLUCOSE 164*  BUN 26*  CREATININE 2.20*  CALCIUM  8.6*   Cardiac Enzymes No results for input(s): TROPONINI in the last 168 hours. RADIOLOGY:  DG Foot 2 Views Left Result Date: 06/21/2024 CLINICAL DATA:  Postop. EXAM: LEFT FOOT - 2 VIEW COMPARISON:  06/20/2024 FINDINGS: Resection of the proximal fifth metatarsal. Overlying wound VAC in place. Cortical thickening at the base of the fourth metatarsal. Stable foot alignment. IMPRESSION: Resection of the proximal fifth metatarsal. Electronically Signed   By: Andrea Gasman M.D.   On: 06/21/2024 16:58   MR FOOT LEFT W WO CONTRAST Result Date: 06/21/2024 CLINICAL DATA:  Plantar foot ulcer/wound status post irrigation and surgical debridement with purulent abscess of the left plantar midfoot. EXAM: MRI OF THE LEFT FOREFOOT WITHOUT AND WITH CONTRAST TECHNIQUE: Multiplanar, multisequence MR imaging of the left foot was performed both before and after administration of  intravenous contrast. CONTRAST:  10mL GADAVIST  GADOBUTROL  1 MMOL/ML IV SOLN COMPARISON:  MRI of the left foot dated 05/15/2024. Left foot radiographs dated 06/20/2024. FINDINGS: Compared to the prior exam dated 05/15/2024, there is a new soft tissue wound along the plantar medial hindfoot/midfoot junction with underlying complex peripherally enhancing fluid collection, most concerning for abscess, measuring approximately 3.2 cm AP x 3.0 cm TR x 2.6 cm craniocaudal (series 18, images 15 and 16 series 15, image 11). This collection appears to communicate with the overlying plantar wound and contains foci of central susceptibility artifact, favored to represent soft tissue air, which could be secondary to intervention/instrumentation or gas-forming infection. This collection extends from the plantar subcutaneous fat insinuates through the underlying deep soft tissues involving the flexor digitorum brevis, abductor digiti minimi, and quadratus plantae muscles with marked associated edema and enhancement, compatible with myositis (series 18, image 14). New peripherally enhancing T2 hyperintense collection within the proximal aspect of the abductor digiti minimi muscle measures 1.7 cm AP x 0.6 cm TR by 0.5 cm craniocaudal (series 17, image 26 and series 13, image 22), abscess is not excluded. Redemonstrated wound with postoperative changes along the lateral and plantar midfoot/hindfoot junction with surrounding soft tissue edema and enhancement. No discrete loculated fluid collection identified in this region. Compared to the prior exam there is markedly increased T2 hyperintense signal abnormality and corresponding T1 hypointensity and enhancement with progressive osteolysis involving the base and proximal shaft of the fifth metatarsal, compatible with osteomyelitis (series 12 and 17, image 27). The fourth metatarsal  base demonstrates mildly increased T2 hyperintense signal abnormality with mild enhancement, which could  reflect reactive marrow changes, however, osteomyelitis can not be excluded. Degenerative arthropathy of the second, third, and fourth TMT joints. IMPRESSION: 1. Compared to the prior exam dated 05/15/2024, there is a new soft tissue wound along the plantar medial hindfoot/midfoot junction with underlying complex peripherally enhancing fluid collection, most concerning for abscess, measuring approximately 3.2 cm AP x 3.0 cm TR x 2.6 cm CC. This collection appears to communicate with the overlying plantar wound and contains foci of central susceptibility artifact, favored to represent soft tissue air, which could be secondary to recent intervention/instrumentation or gas-forming infection. This collection insinuates from the plantar subcutaneous fat through the underlying deep soft tissues involving the flexor digitorum brevis, abductor digiti minimi, and quadratus plantae muscles with associated myositis and cellulitis. 2. Redemonstrated wound with postoperative changes along the lateral and plantar midfoot/hindfoot junction with surrounding soft tissue edema and enhancement, compatible with cellulitis. No discrete loculated fluid collection identified in this region. Compared to the prior exam there is markedly increased signal abnormality with enhancement and progressive osteolysis involving the base and proximal shaft of the fifth metatarsal, compatible with osteomyelitis. The fourth metatarsal base demonstrates mildly increased edema like signal abnormality with mild enhancement, which could reflect reactive marrow changes, however, osteomyelitis can not be excluded. 3. New peripherally enhancing collection within the proximal abductor digiti minimi muscle measuring 1.7 x 0.6 x 0.5 cm, abscess is not excluded. Electronically Signed   By: Harrietta Sherry M.D.   On: 06/21/2024 10:27    Assessment and Plan  Clayton [name] is a 53 y.o. male with medical history significant of IDDM, CKD stage IIIa, HTN, HLD,  sent from podiatrist office for evaluation of worsening of left foot infection.   Patient developed a ulcer on bottom of left foot about 1 month ago for which has been following with podiatry for outpatient care as well as p.o. antibiotics Keflex.  Last week, patient started to see some discharges from the wound and went to see podiatry yesterday who sent patient to ED for evaluation of abscess formation in the left foot diabetic ulcer  Left foot diabetic ulcer infection, failed outpatient management Left foot abscess formation Suspected osteomyelitis - s/p Incision and drainage of abscess plantar midfoot below fascia level single site 2.  Partial fifth metatarsal base resection, left foot 3.  Bone biopsy fourth metatarsal base via trocar deep, left foot 4. Application negative pressure wound therapy  10 x 4 cm, left foot  - MRI foot results noted -appreciate infectious disease input--IV Zosyn  and Daptomycin   IDDM with hyperglycemia, poorly controlled - SSI -  70/30  20 mg twice daily -- diabetes coordinator to see patient -- A1c 11.1%  CKD stage IIIb, in the setting of type II diabetes - Euvolemic, continue home BP meds -- patient has appointment to see nephrology Dr. Sherry as outpatient   HTN - Stable, continue amlodipine  and Coreg  and losartan    HLD -Continue statin   Obesity - BMI= 33     Procedures: left diabetic foot surgery Family communication : none Consults : ID, podiatry CODE STATUS: full DVT Prophylaxis :Lovenox  Level of care: Med-Surg Status is: Inpatient Remains inpatient appropriate because: postop day one diabetic foot ulcer.    TOTAL TIME TAKING CARE OF THIS PATIENT: 40 minutes.  >50% time spent on counselling and coordination of care  Note: This dictation was prepared with Dragon dictation along with smaller phrase technology. Any transcriptional  errors that result from this process are unintentional.  Leita Blanch M.D    [facility]    CC: Primary care physician; Arden Fairy Lot, MD

## 2024-06-22 NOTE — Plan of Care (Signed)
  Problem: Education: Goal: Knowledge of General Education information will improve Description: Including pain rating scale, medication(s)/side effects and non-pharmacologic comfort measures Outcome: Progressing   Problem: Health Behavior/Discharge Planning: Goal: Ability to manage health-related needs will improve Outcome: Progressing   Problem: Clinical Measurements: Goal: Ability to maintain clinical measurements within normal limits will improve Outcome: Progressing Goal: Will remain free from infection Outcome: Progressing Goal: Diagnostic test results will improve Outcome: Progressing Goal: Respiratory complications will improve Outcome: Progressing Goal: Cardiovascular complication will be avoided Outcome: Progressing   Problem: Activity: Goal: Risk for activity intolerance will decrease Outcome: Progressing   Problem: Nutrition: Goal: Adequate nutrition will be maintained Outcome: Progressing   Problem: Coping: Goal: Level of anxiety will decrease Outcome: Progressing   Problem: Coping: Goal: Level of anxiety will decrease Outcome: Progressing   Problem: Elimination: Goal: Will not experience complications related to bowel motility Outcome: Progressing Goal: Will not experience complications related to urinary retention Outcome: Progressing   Problem: Pain Managment: Goal: General experience of comfort will improve and/or be controlled Outcome: Progressing   Problem: Safety: Goal: Ability to remain free from injury will improve Outcome: Progressing   Problem: Skin Integrity: Goal: Risk for impaired skin integrity will decrease Outcome: Progressing   Problem: Education: Goal: Ability to describe self-care measures that may prevent or decrease complications (Diabetes Survival Skills Education) will improve Outcome: Progressing Goal: Individualized Educational Video(s) Outcome: Progressing   Problem: Fluid Volume: Goal: Ability to maintain a  balanced intake and output will improve Outcome: Progressing   Problem: Coping: Goal: Ability to adjust to condition or change in health will improve Outcome: Progressing   Problem: Health Behavior/Discharge Planning: Goal: Ability to identify and utilize available resources and services will improve Outcome: Progressing Goal: Ability to manage health-related needs will improve Outcome: Progressing

## 2024-06-22 NOTE — Consult Note (Signed)
 WOC Nurse Consult Note: Consult requested for WOC team to change Vac dressing to left foot on Fri. Podiatry team is following for assessment and plan of care and Vac was applied in the OR yesterday.  WOC team will change tomorrow as requested. Has black foam into both plantar midfoot I&D site and lateral foot wound which are bridged together.  Thank-you,  Stephane Fought MSN, RN, CWOCN, CWCN-AP, CNS Contact Mon-Fri 0700-1500: [phone]

## 2024-06-23 ENCOUNTER — Other Ambulatory Visit: Payer: Self-pay

## 2024-06-23 ENCOUNTER — Ambulatory Visit: Payer: MEDICAID | Admitting: Podiatry

## 2024-06-23 DIAGNOSIS — E1151 Type 2 diabetes mellitus with diabetic peripheral angiopathy without gangrene: Secondary | ICD-10-CM

## 2024-06-23 DIAGNOSIS — L089 Local infection of the skin and subcutaneous tissue, unspecified: Secondary | ICD-10-CM

## 2024-06-23 LAB — GLUCOSE, CAPILLARY
Glucose-Capillary: 125 mg/dL — ABNORMAL HIGH (ref 70–99)
Glucose-Capillary: 133 mg/dL — ABNORMAL HIGH (ref 70–99)
Glucose-Capillary: 131 mg/dL — ABNORMAL HIGH (ref 70–99)
Glucose-Capillary: 105 mg/dL — ABNORMAL HIGH (ref 70–99)

## 2024-06-23 LAB — WOUND CULTURE
MICRO NUMBER:: 16911031
SPECIMEN QUALITY:: ADEQUATE

## 2024-06-23 MED ORDER — ENSURE MAX PROTEIN PO LIQD
11.0000 [oz_av] | Freq: Two times a day (BID) | ORAL | Status: DC
  Administered 2024-06-23 – 2024-06-24 (×3): 11 [oz_av] via ORAL

## 2024-06-23 MED ORDER — VITAMIN C 500 MG PO TABS
500.0000 mg | ORAL_TABLET | Freq: Two times a day (BID) | ORAL | Status: DC
  Administered 2024-06-23 – 2024-06-24 (×2): 500 mg via ORAL
  Filled 2024-06-23 (×2): qty 1

## 2024-06-23 MED ORDER — AMLODIPINE BESYLATE 10 MG PO TABS
10.0000 mg | ORAL_TABLET | Freq: Every day | ORAL | Status: DC
  Administered 2024-06-23 – 2024-06-24 (×2): 10 mg via ORAL
  Filled 2024-06-23 (×2): qty 1

## 2024-06-23 MED ORDER — ADULT MULTIVITAMIN W/MINERALS CH
1.0000 | ORAL_TABLET | Freq: Every day | ORAL | Status: DC
  Administered 2024-06-24: 1 via ORAL
  Filled 2024-06-23: qty 1

## 2024-06-23 MED ORDER — SODIUM CHLORIDE 0.9 % IV SOLN
1.0000 g | Freq: Two times a day (BID) | INTRAVENOUS | Status: DC
  Administered 2024-06-23 – 2024-06-24 (×2): 1 g via INTRAVENOUS
  Filled 2024-06-23 (×2): qty 20

## 2024-06-23 MED ORDER — SODIUM CHLORIDE 0.9% FLUSH: 10.0000 mL | INTRAVENOUS | Status: DC | PRN

## 2024-06-23 MED ORDER — OXYCODONE-ACETAMINOPHEN 5-325 MG PO TABS
1.0000 | ORAL_TABLET | Freq: Four times a day (QID) | ORAL | Status: DC | PRN
  Administered 2024-06-23 – 2024-06-24 (×3): 2 via ORAL
  Filled 2024-06-23 (×3): qty 2

## 2024-06-23 MED ORDER — ZINC SULFATE 220 (50 ZN) MG PO CAPS
220.0000 mg | ORAL_CAPSULE | Freq: Every day | ORAL | Status: DC
  Administered 2024-06-24: 220 mg via ORAL
  Filled 2024-06-23: qty 1

## 2024-06-23 MED ORDER — MORPHINE SULFATE (PF) 2 MG/ML IV SOLN
2.0000 mg | Freq: Once | INTRAVENOUS | Status: AC | PRN
  Administered 2024-06-23: 2 mg via INTRAVENOUS
  Filled 2024-06-23: qty 1

## 2024-06-23 MED ORDER — SODIUM CHLORIDE 0.9% FLUSH
10.0000 mL | Freq: Two times a day (BID) | INTRAVENOUS | Status: DC
  Administered 2024-06-23 – 2024-06-24 (×2): 10 mL

## 2024-06-23 MED ORDER — CHLORHEXIDINE GLUCONATE CLOTH 2 % EX PADS
6.0000 | MEDICATED_PAD | Freq: Every day | CUTANEOUS | Status: DC
  Administered 2024-06-23 – 2024-06-24 (×2): 6 via TOPICAL

## 2024-06-23 NOTE — Treatment Plan (Addendum)
 Diagnosis:  Left foot infection Polymicrobial infection Baseline Creatinine 2.30    No Known Allergies  OPAT Orders Meropenem  1 gram IV every 12 hours Daptomycin  700mg  every day Please dispense for 5 days if possible as once the culture results are finalized antibiotic may be changed  Duration: 5 ( 7 days if 5 days cannot be done) End Date: TBD  [facility] Care Per Protocol:  Labs weekly while on IV antibiotics: _X_ CBC with differential  _X_ CMP  X CK   _X_ Please leave PIC in place until doctor has seen patient or been notified  Fax weekly lab results  promptly to [phone]  Clinic Follow Up Appt: 07/06/24 at 9.15 am   Call [phone] with any questions or critical value

## 2024-06-23 NOTE — Progress Notes (Signed)
 Peripherally Inserted Central Catheter Placement  The IV Nurse has discussed with the patient and/or persons authorized to consent for the patient, the purpose of this procedure and the potential benefits and risks involved with this procedure.  The benefits include less needle sticks, lab draws from the catheter, and the patient may be discharged home with the catheter. Risks include, but not limited to, infection, bleeding, blood clot (thrombus formation), and puncture of an artery; nerve damage and irregular heartbeat and possibility to perform a PICC exchange if needed/ordered by physician.  Alternatives to this procedure were also discussed.  Bard Power PICC patient education guide, fact sheet on infection prevention and patient information card has been provided to patient /or left at bedside.    PICC Placement Documentation  PICC Single Lumen 06/23/24 Right Brachial 40 cm 1 cm (Active)  Indication for Insertion or Continuance of Line Prolonged intravenous therapies 06/23/24 1655  Exposed Catheter (cm) 1 cm 06/23/24 1655  Site Assessment Clean, Dry, Intact 06/23/24 1655  Line Status Flushed;Blood return noted;Saline locked 06/23/24 1655  Dressing Type Transparent 06/23/24 1655  Dressing Status Antimicrobial disc/dressing in place 06/23/24 1655  Line Care Connections checked and tightened 06/23/24 1655  Line Adjustment (NICU/IV Team Only) No 06/23/24 1655  Dressing Intervention New dressing 06/23/24 1655  Dressing Change Due 06/30/24 06/23/24 1655       Clayton [name] 06/23/2024, 5:10 PM

## 2024-06-23 NOTE — Plan of Care (Signed)
  Problem: Clinical Measurements: Goal: Ability to maintain clinical measurements within normal limits will improve Outcome: Progressing   Problem: Clinical Measurements: Goal: Diagnostic test results will improve Outcome: Progressing   Problem: Activity: Goal: Risk for activity intolerance will decrease Outcome: Progressing   Problem: Coping: Goal: Level of anxiety will decrease Outcome: Progressing   Problem: Pain Managment: Goal: General experience of comfort will improve and/or be controlled Outcome: Progressing

## 2024-06-23 NOTE — Progress Notes (Signed)
  Subjective:  Patient ID: Clayton [name], male    DOB: 30-Jan-1971,  MRN: [id number]  Chief Complaint  Patient presents with   Foot Pain    DOS: 06/21/24 Procedure: 1.  Incision and drainage of abscess plantar midfoot below fascia level single site 2.  Partial fifth metatarsal base resection, left foot 3.  Bone biopsy fourth metatarsal base via trocar deep, left foot 4. Application negative pressure wound therapy  10 x 4 cm, left foot  53 y.o. male seen for post op check. Pt seen resting in bed, he is having some pain in the foot intermittently, says he got morphine  earlier today. We discussed findings from OR and plans for wound vac moving forward.   Review of Systems: Negative except as noted in the HPI. Denies N/V/F/Ch.   Objective:   Constitutional Well developed. Well nourished.  Vascular Foot warm and well perfused. Capillary refill normal to all digits.   No calf pain with palpation  Neurologic Normal speech. Oriented to person, place, and time. Epicritic sensation diminished to left foot  Dermatologic Wound vac and outer dressigns c/d/I - total of 300 mL serosanguinous fluid in canister - pt reports this is total since surgery.   Orthopedic: S/p L foot I&D, 5ht metatarsal base resection   Radiographs: Resection of the proximal fifth metatarsal. Overlying wound VAC in place. Cortical thickening at the base of the fourth metatarsal. Stable foot alignment.  Pathology: collected, pending  Micro: RARE GRAM NEGATIVE RODS  RARE GRAM POSITIVE COCCI, pending  Assessment:   1. Diabetic ulcer of left midfoot associated with type 2 diabetes mellitus, unspecified ulcer stage (HCC)   2. 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  POD # 2 s/p above  procedures -Progressing as expected post op, wound appears as expected on pictures from WOCN vac change this AM. Discontinued lovenox  due to residual bleeding from wounds - Continue wound vac at this time, next change Monday.  - Appreciate ID recs, plan for IV abx at home, picc line pending -XR: Expected post op changes -WB Status: NWB in post op shoe to L foot, elevate extremity while in bed -Sutures: remain intact. -Medications/ABX: Abx per ID, cultures in process, pathology also pending - f/u 4th met base pathology. Original encouter / wound with cuboid OM + on biopsy = high risk for recurrence of OM in the lateral midfoot moving forward.  -Dressing: Wound vac dressing change 2x weekly per [facility] RN, he has Home vac at home and [facility] RN in place per pt.   - On discharge, plan for clamp vac tubing and hook up to home vac - better if his wife can bring home vac in to hospital.  - F/u Plan: pt to follow up in 2 weeks with Dr. Janit in [place] office. Stable for DC from my standpoint when abx plan in place, possibly tomorrow.         Marolyn JULIANNA Honour, DPM [facility] / [facility]

## 2024-06-23 NOTE — Inpatient Diabetes Management (Signed)
 Inpatient Diabetes Program Recommendations  AACE/ADA: New Consensus Statement on Inpatient Glycemic Control   Target Ranges:  Prepandial:   less than 140 mg/dL      Peak postprandial:   less than 180 mg/dL (1-2 hours)      Critically ill patients:  140 - 180 mg/dL   Lab Results  Component Value Date   GLUCAP 125 (H) 06/23/2024   HGBA1C 6.4 (H) 06/21/2024    Review of Glycemic Control  Diabetes history: DM2 Outpatient Diabetes medications: Humalog  75/25 20 units bid  Current orders for Inpatient glycemic control: Novolog  0-9 units TID with meals, Novolog  0-5 units at bedtime, and Novolog  70/30 units BID with meals.   Inpatient Diabetes Program Recommendations:   Discharge Recommendations: Intermediate acting recommendations: Insulin  Lispro Prot & Lispro (HUMALOG  75/25) Kwikpen 20 units BID    Use Adult Diabetes Insulin  Treatment Post Discharge order set.

## 2024-06-23 NOTE — TOC Progression Note (Signed)
 Transition of Care [facility]) - Progression Note    Patient Details  Name: Clayton [name] MRN: [id number] Date of Birth: May 21, 1971  [facility] CM/SW Contact  Corean ONEIDA Haddock, RN Phone Number: 06/23/2024, 3:42 PM  Clinical Narrative:      Notified anticipated dc tomorrow with home IV antibiotic infusion Met with patient and wife [name] at discharge.   [name] to bring home vac to the bedside to be placed prior to discharge Patient has his knee scooter at bedside, and PT has adjusted the handle tightness for the patient   Referral was made to [facility] with [employer] for home IV antibiotics.  Pam to reach out to [name] to review patient cost, as Medicaid is still pending.   Pam to completed education with patient and [name] 06/23/24  Darleene with [facility] notified that anticipated DC tomorrow.  He states that a new LOG would be needed.  LOG sent to Zack, Jerri, and Francine with [facility] for approval                  Expected Discharge Plan and Services                                               Social Drivers of Health (SDOH) Interventions SDOH Screenings   Food Insecurity: No Food Insecurity (06/21/2024)  Housing: Low Risk  (06/21/2024)  Transportation Needs: No Transportation Needs (06/21/2024)  Utilities: Not At Risk (06/21/2024)  Financial Resource Strain: Low Risk  (07/07/2023)   Received from [facility]  Tobacco Use: Low Risk  (06/21/2024)  Recent Concern: Tobacco Use - Medium Risk (05/25/2024)   [facility]    Readmission Risk Interventions     No data to display

## 2024-06-23 NOTE — Progress Notes (Signed)
 Mobility Specialist - Progress Note   06/23/24 1013  Mobility  Activity Ambulated with assistance;Stood at bedside  Level of Assistance Standby assist, set-up cues, supervision of patient - no hands on  Assistive Device  (knee scooter)  Distance Ambulated (ft) 12 ft  Activity Response Tolerated well  Mobility visit 1 Mobility  Mobility Specialist Start Time (ACUTE ONLY) 0930  Mobility Specialist Stop Time (ACUTE ONLY) [id number]  Mobility Specialist Time Calculation (min) (ACUTE ONLY) 8 min   MS responding to bathroom bell, pt standing near the sink upon arrival. Pt stood at the sink for ~5 mins while indep washing up. Pt amb to the recliner SBA, MinA for equipment management-- tolerated well. Pt left seated in the recliner with needs within reach. Wound care present at bedside.  America Silvan Mobility Specialist 06/23/24 10:20 AM

## 2024-06-23 NOTE — Progress Notes (Signed)
 [facility]  - [place] [facility]   PATIENT NAME: Clayton [name]    MR#:  [id number]  DATE OF BIRTH:  Feb 11, 1971  SUBJECTIVE:  no family at bedside. Patient sitting out in the recliner. Underwent diabetic foot ulcer surgery on the left foot yesterday by podiatry  Pt got Wound vac changed today--local bleeding. C/o pain. Wants to go home once IV abxs get set up.     VITALS:  Blood pressure (!) 184/94, pulse 70, temperature 98.2 F (36.8 C), resp. rate 16, height 6' (1.829 m), weight 110.7 kg, SpO2 99%.  PHYSICAL EXAMINATION:   GENERAL:  53 y.o.-year-old patient with no acute distress.  LUNGS: Normal breath sounds bilaterally, no wheezing CARDIOVASCULAR: S1, S2 normal. No murmur   ABDOMEN: Soft, nontender, nondistended. Bowel sounds present.  EXTREMITIES: POD #1 NEUROLOGIC: nonfocal  patient is alert and awake SKIN: as above  LABORATORY PANEL:  CBC Recent Labs  Lab 06/22/24 0359  WBC 8.3  HGB 10.3*  HCT 31.2*  PLT 212    Chemistries  Recent Labs  Lab 06/22/24 0359  NA 136  K 4.0  CL 105  CO2 23  GLUCOSE 164*  BUN 26*  CREATININE 2.20*  CALCIUM  8.6*   Cardiac Enzymes No results for input(s): TROPONINI in the last 168 hours. RADIOLOGY:  US  EKG SITE RITE Result Date: 06/23/2024 If Site Rite image not attached, placement could not be confirmed due to current cardiac rhythm.  DG Foot 2 Views Left Result Date: 06/21/2024 CLINICAL DATA:  Postop. EXAM: LEFT FOOT - 2 VIEW COMPARISON:  06/20/2024 FINDINGS: Resection of the proximal fifth metatarsal. Overlying wound VAC in place. Cortical thickening at the base of the fourth metatarsal. Stable foot alignment. IMPRESSION: Resection of the proximal fifth metatarsal. Electronically Signed   By: Andrea Gasman M.D.   On: 06/21/2024 16:58    Assessment and Plan  Daxon Kyne is a 53 y.o. male with medical history significant of IDDM, CKD stage IIIa, HTN, HLD, sent from podiatrist office for  evaluation of worsening of left foot infection.   Patient developed a ulcer on bottom of left foot about 1 month ago for which has been following with podiatry for outpatient care as well as p.o. antibiotics Keflex.  Last week, patient started to see some discharges from the wound and went to see podiatry yesterday who sent patient to ED for evaluation of abscess formation in the left foot diabetic ulcer  Left foot diabetic ulcer infection, failed outpatient management Left foot abscess formation Suspected osteomyelitis - s/p Incision and drainage of abscess plantar midfoot below fascia level single site 2.  Partial fifth metatarsal base resection, left foot 3.  Bone biopsy fourth metatarsal base via trocar deep, left foot 4. Application negative pressure wound therapy  10 x 4 cm, left foot  - MRI foot results noted -appreciate infectious disease input--IV Zosyn  and Daptomycin  --Wound vac changed today.  --PICC line to be placed. --pt to go home with IV abxs--await OPAT orders  IDDM with hyperglycemia, poorly controlled - SSI -  70/30  20 mg twice daily -- diabetes coordinator to see patient -- A1c 11.1%  CKD stage IIIb, in the setting of type II diabetes - Euvolemic, continue home BP meds -- patient has appointment to see nephrology Dr. Marcelino as outpatient   HTN - Stable, continue amlodipine  and Coreg  and losartan    HLD -Continue statin   Obesity - BMI= 33     Procedures: left diabetic foot surgery Family  communication : none Consults : ID, podiatry CODE STATUS: full DVT Prophylaxis :SCD due to wound bleeding Level of care: Med-Surg Status is: Inpatient Remains inpatient appropriate because: postop day one diabetic foot ulcer.    TOTAL TIME TAKING CARE OF THIS PATIENT: 40 minutes.  >50% time spent on counselling and coordination of care  Note: This dictation was prepared with Dragon dictation along with smaller phrase technology. Any transcriptional errors that  result from this process are unintentional.  Leita Blanch M.D    [facility]   CC: Primary care physician; Arden Fairy Lot, MD

## 2024-06-23 NOTE — Consult Note (Addendum)
 WOC Nurse follow-up consult Note: Vac dressing changed to left foot wounds.  2 full thickness post-op wounds; red and moist; inner foot with mod amt bleeding when the sponges were removed, outer foot with small amt bleeding.  Left outer plantar foot 6X3X3cm, left inner foot 4X2X4cm with tracking underneath the skin to 4 cm.  Pt was medicated for pain prior to the procedure and tolerated with mod amt discomfort. Applied one piece black sponge to each wound and bridged together to cont suction.  Mod amt pink drainage in the cannister prior to the procedure. Extra Vac dressing and drape and cannister left at the bedside for staff nurses's use.  Secure chat message sent to Dr Malvin of the podiatry team as follows, I changed the Vac dressing and inserted photos for both wounds in the EMR.  The inner wound had a mod amt bleeding and sometimes this clogs the Vac and it may need to be discontinued by the bedside nurses later if it continues to have clots in the filter and track pad. The patient is also on anticoagulation.  Inner foot     Outer foot   Topical treatment orders provided for bedside nurses as follows: If Vac has mod amt bloody drainage, please notify Dr Malvin of the podiatry team. If it is alarming, blockage then please try to flush the track pad or tubing to clear the machine filter, or change the Vac cannister.  WOC team will plan to change Vac dressing again on Mon if patient is still in the hospital at that time.  He states he has a Vac machine which is still at home prior to this admission.   Thank-you,  Stephane Fought MSN, RN, CWOCN, CWCN-AP, CNS Contact Mon-Fri 0700-1500: [phone]

## 2024-06-23 NOTE — Progress Notes (Signed)
 Date of Admission:  06/20/2024    ID: Clayton [name] is a 53 y.o. 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)   Foot ulcer (HCC)   Abscess of left foot    Subjective: Patient is doing fine Comfortable no issues  Medications:   amLODipine   10 mg Oral Daily   vitamin C   500 mg Oral BID   carvedilol   25 mg Oral BID   Chlorhexidine  Gluconate Cloth  6 each Topical Daily   insulin  aspart  0-5 Units Subcutaneous QHS   insulin  aspart  0-9 Units Subcutaneous TID WC   insulin  aspart protamine- aspart  20 Units Subcutaneous BID WC   losartan   100 mg Oral Daily   [START ON 06/24/2024] multivitamin with minerals  1 tablet Oral Daily   Ensure Max Protein  11 oz Oral BID   sodium chloride  flush  10-40 mL Intracatheter Q12H   [START ON 06/24/2024] zinc  sulfate (50mg  elemental zinc )  220 mg Oral Daily    Objective: Vital signs in last 24 hours: Patient Vitals for the past 24 hrs:  BP Temp Temp src Pulse Resp SpO2  06/23/24 1740 (!) 182/86 98.6 F (37 C) Oral 73 14 100 %  06/23/24 0825 (!) 184/94 98.2 F (36.8 C) -- 70 16 99 %  06/23/24 0349 (!) 174/92 97.7 F (36.5 C) -- (!) 59 16 99 %  06/22/24 2032 (!) 170/91 98.5 F (36.9 C) Oral 66 16 99 %       PHYSICAL EXAM:  General: Alert, cooperative, no distress, appears stated age.  Lungs: Clear to auscultation bilaterally. No Wheezing or Rhonchi. No rales. Heart: Regular rate and rhythm, no murmur, rub or gallop. Abdomen: Soft, non-tender,not distended. Bowel sounds normal. No masses Extremities: Left foot has wound VAC Skin: No rashes or lesions. Or bruising Lymph: Cervical, supraclavicular normal. Neurologic: Grossly non-focal  Lab Results    Latest Ref Rng & Units 06/22/2024    3:59 AM 06/20/2024    7:48 PM 06/02/2024   10:24 AM  CBC  WBC 4.0 - 10.5 K/uL 8.3  8.2  7.6   Hemoglobin 13.0 - 17.0 g/dL 89.6  87.3   9.1   Hematocrit 39.0 - 52.0 % 31.2  38.5  28.7   Platelets 150 - 400 K/uL 212  267  361        Latest Ref Rng & Units 06/22/2024    3:59 AM 06/20/2024    7:48 PM 06/02/2024   10:24 AM  CMP  Glucose 70 - 99 mg/dL 835  657  824   BUN 6 - 20 mg/dL 26  35  18   Creatinine 0.61 - 1.24 mg/dL 7.79  7.70  7.58   Sodium 135 - 145 mmol/L 136  132  138   Potassium 3.5 - 5.1 mmol/L 4.0  4.4  4.1   Chloride 98 - 111 mmol/L 105  102  103   CO2 22 - 32 mmol/L 23  20  23    Calcium  8.9 - 10.3 mg/dL 8.6  9.2  9.2   Total Protein 6.5 - 8.1 g/dL   7.4   Total Bilirubin 0.0 - 1.2 mg/dL   0.8   Alkaline Phos 38 - 126 U/L   77   AST 15 - 41 U/L   26   ALT 0 - 44 U/L   26  Microbiology: Surgical cultures - acinetobacter, staph aureus Studies/Results: US  EKG SITE RITE Result Date: 06/23/2024 If Site Rite image not attached, placement could not be confirmed due to current cardiac rhythm.    Assessment/Plan: Diabetic foot infection of the left side This is ongoing since July Underwent debridement in July and cultures had MSSA, Streptococcus and Prevotella.  Cuboid bone biopsy was acute osteomyelitis.  As patient at that time did not want IV antibiotics because of lack of insurance he was given 2 doses of weekly dalbavancin  and then was placed on cefadroxil  for 4 more weeks.  He also received Flagyl . The primary wound and ulcer is doing much better but he now has a new abscess in the foot He underwent I&D yesterday and cultures have been sent He is currently on v daptomycin  and Zosyn  pending culture results- so far staph aureus and acinetobacter in culture This time he will go on IV antibiotics Home infusion HE will get a PICC Will do dapto and meropenem  till the culture finalizes and susceptibility is available Will do for 1 week now and adjust antibiotic   Diabetes mellitus with peripheral neuropathy  CKD will avoid vancomycin     Discussed management with the patient  at  bedside. Discussed with hospitalist and pharmacist This assessment involved complex antimicrobial management  OPAT note written

## 2024-06-23 NOTE — Progress Notes (Signed)
 PHARMACY CONSULT NOTE FOR:  OUTPATIENT  PARENTERAL ANTIBIOTIC THERAPY (OPAT)  Indication: Polymicrobial diabetic foot infection  Regimen: Daptomycin  700mg  IV q24h PLUS meropenem  1gm IV q12h End date:  06/30/2024  Labs - Once weekly:  CBC/D, CMP, and CPK  Fax weekly lab results  promptly to [phone] Please leave PIC in place until doctor has seen patient or been notified  Call [phone] with any questions or critical value   IV antibiotic discharge orders are pended. To discharging provider:  please sign these orders via discharge navigator,  Select New Orders & click on the button choice - Manage This Unsigned Work.     Thank you for allowing pharmacy to be a part of this patient's care.  Zyanya Glaza, PharmD, BCPS, BCIDP Work Cell: [phone] 06/23/2024 3:08 PM

## 2024-06-23 NOTE — Anesthesia Postprocedure Evaluation (Signed)
 Anesthesia Post Note  Patient: Clayton [name]  Procedure(s) Performed: IRRIGATION AND DEBRIDEMENT ABSCESS (Left)  Patient location during evaluation: PACU Anesthesia Type: General Level of consciousness: awake and alert Pain management: pain level controlled Vital Signs Assessment: post-procedure vital signs reviewed and stable Respiratory status: spontaneous breathing, nonlabored ventilation and respiratory function stable Cardiovascular status: blood pressure returned to baseline and stable Postop Assessment: no apparent nausea or vomiting Anesthetic complications: no   No notable events documented.   Last Vitals:  Vitals:   06/22/24 2032 06/23/24 0349  BP: (!) 170/91 (!) 174/92  Pulse: 66 (!) 59  Resp: 16 16  Temp: 36.9 C 36.5 C  SpO2: 99% 99%    Last Pain:  Vitals:   06/22/24 2236  TempSrc:   PainSc: 3                  Fairy POUR Shabana Armentrout

## 2024-06-24 ENCOUNTER — Other Ambulatory Visit: Payer: Self-pay

## 2024-06-24 DIAGNOSIS — L97429 Non-pressure chronic ulcer of left heel and midfoot with unspecified severity: Secondary | ICD-10-CM

## 2024-06-24 DIAGNOSIS — E11621 Type 2 diabetes mellitus with foot ulcer: Secondary | ICD-10-CM

## 2024-06-24 LAB — GLUCOSE, CAPILLARY: Glucose-Capillary: 78 mg/dL (ref 70–99)

## 2024-06-24 MED ORDER — CARVEDILOL 25 MG PO TABS
25.0000 mg | ORAL_TABLET | Freq: Two times a day (BID) | ORAL | 1 refills | Status: AC
  Filled 2024-06-24: qty 180, 90d supply, fill #0

## 2024-06-24 MED ORDER — DAPTOMYCIN IV (FOR PTA / DISCHARGE USE ONLY): 700.0000 mg | INTRAVENOUS | 0 refills | Status: AC

## 2024-06-24 MED ORDER — MEROPENEM IV (FOR PTA / DISCHARGE USE ONLY): 1.0000 g | Freq: Two times a day (BID) | INTRAVENOUS | 0 refills | Status: AC

## 2024-06-24 MED ORDER — INSULIN LISPRO PROT & LISPRO (75-25 MIX) 100 UNIT/ML KWIKPEN
20.0000 [IU] | PEN_INJECTOR | Freq: Two times a day (BID) | SUBCUTANEOUS | 11 refills | Status: AC
  Filled 2024-06-24: qty 15, 38d supply, fill #0

## 2024-06-24 MED ORDER — OXYCODONE-ACETAMINOPHEN 5-325 MG PO TABS
1.0000 | ORAL_TABLET | Freq: Four times a day (QID) | ORAL | 0 refills | Status: DC | PRN
  Filled 2024-06-24: qty 30, 4d supply, fill #0

## 2024-06-24 MED ORDER — AMLODIPINE BESYLATE 10 MG PO TABS
10.0000 mg | ORAL_TABLET | Freq: Every day | ORAL | 1 refills | Status: DC
  Filled 2024-06-24: qty 90, 90d supply, fill #0

## 2024-06-24 MED ORDER — ADULT MULTIVITAMIN W/MINERALS CH
1.0000 | ORAL_TABLET | Freq: Every day | ORAL | 3 refills | Status: AC
Start: 2024-06-25 — End: ?
  Filled 2024-06-24: qty 30, 30d supply, fill #0
  Filled 2024-08-23: qty 30, 30d supply, fill #1

## 2024-06-24 MED ORDER — LOSARTAN POTASSIUM 100 MG PO TABS
100.0000 mg | ORAL_TABLET | Freq: Every day | ORAL | 2 refills | Status: DC
  Filled 2024-06-24: qty 90, 90d supply, fill #0

## 2024-06-24 MED ORDER — INSULIN PEN NEEDLE 32G X 4 MM MISC
11 refills | Status: AC
  Filled 2024-06-24: qty 100, 50d supply, fill #0

## 2024-06-24 MED ORDER — ENSURE MAX PROTEIN PO LIQD
11.0000 [oz_av] | Freq: Two times a day (BID) | ORAL | 0 refills | Status: AC
  Filled 2024-06-24: qty 237, 1d supply, fill #0

## 2024-06-24 MED ORDER — ASCORBIC ACID 500 MG PO TABS
500.0000 mg | ORAL_TABLET | Freq: Two times a day (BID) | ORAL | 1 refills | Status: AC
  Filled 2024-06-24: qty 90, 45d supply, fill #0
  Filled 2024-08-23: qty 90, 45d supply, fill #1

## 2024-06-24 NOTE — Discharge Instructions (Signed)
 Keep log of sugars at home and review with PCP to adjust dosing

## 2024-06-24 NOTE — Plan of Care (Signed)

## 2024-06-24 NOTE — Discharge Summary (Addendum)
 Physician Discharge Summary   Patient: Clayton [name] MRN: [id number] DOB: 18-Jan-1971  Admit date:     06/20/2024  Discharge date: 06/24/24  Discharge Physician: Leita Blanch   PCP: Arden Fairy Lot, MD   Recommendations at discharge:    [name] podiatry in 1-2 weeks Wound vac per instructions [name] on 07/06/24/pt to call and reschedule f/u appt with Dr Marcelino F/u PCP in 1-2 weeks  Discharge Diagnoses: Principal Problem:   Cellulitis and abscess of toe of left foot Active Problems:   Diabetic ulcer of left midfoot associated with type 2 diabetes mellitus (HCC)   Foot ulcer (HCC)   Abscess of left foot   Diabetic foot infection (HCC)   Clayton [name] is a 53 y.o. male with medical history significant of IDDM, CKD stage IIIa, HTN, HLD, sent from podiatrist office for evaluation of worsening of left foot infection.   Patient developed a ulcer on bottom of left foot about 1 month ago for which has been following with podiatry for outpatient care as well as p.o. antibiotics Keflex.  Last week, patient started to see some discharges from the wound and went to see podiatry yesterday who sent patient to ED for evaluation of abscess formation in the left foot diabetic ulcer   Left foot diabetic ulcer infection, failed outpatient management Left foot abscess formation Suspected osteomyelitis - s/p Incision and drainage of abscess plantar midfoot below fascia level single site 2.  Partial fifth metatarsal base resection, left foot 3.  Bone biopsy fourth metatarsal base via trocar deep, left foot 4. Application negative pressure wound therapy  10 x 4 cm, left foot  - MRI foot results noted -appreciate infectious disease input--IV Zosyn  and Daptomycin  --Wound vac changed today.  --PICC line to be placed. --pt to go home with IV abxs   OPAT Orders Meropenem  1 gram IV every 12 hours Daptomycin  700mg  every day Please dispense for 5 days if possible as once the culture  results are finalized antibiotic may be changed   Duration: 5 ( 7 days if 5 days cannot be done) End Date: TBD   [facility] Care Per Protocol:   Labs weekly while on IV antibiotics: _X_ CBC with differential   _X_ CMP  X CK _X_ Please leave PIC in place until doctor has seen patient or been notifie  Fax weekly lab results  promptly to [phone]   Clinic Follow Up Appt: 07/06/24 at 9.15 am  Call [phone] with any questions or critical value  IDDM with hyperglycemia, poorly controlled - SSI - --insulin  75/25 bid at discharge -- diabetes coordinator to see patient -- A1c 11.1% --pt will benefit for endocrinology f/u as outpt   CKD stage IIIb, in the setting of type II diabetes - Euvolemic, continue home BP meds -- patient has appointment to see nephrology Dr. Marcelino as outpatient   HTN - Stable, continue amlodipine  and Coreg  and losartan    HLD -not taking statins   Obesity - BMI= 33         Procedures: left diabetic foot surgery Family communication : Wife [name] Consults : ID, podiatry CODE STATUS: full DVT Prophylaxis :SCD due to wound bleeding    Pain control - [place]  Controlled Substance Reporting System database was reviewed. and patient was instructed, not to drive, operate heavy machinery, perform activities at heights, swimming or participation in water  activities or provide baby-sitting services while on Pain, Sleep and Anxiety Medications; until their outpatient Physician has advised to  do so again. Also recommended to not to take more than prescribed Pain, Sleep and Anxiety Medications.  Disposition: Home health Diet recommendation:  Cardiac and Carb modified diet DISCHARGE MEDICATION: Allergies as of 06/24/2024   No Known Allergies      Medication List     STOP taking these medications    albuterol  108 (90 Base) MCG/ACT inhaler Commonly known as: VENTOLIN  HFA   cefadroxil  500 MG capsule Commonly known as: DURICEF   loratadine  10 MG  tablet Commonly known as: Claritin    pantoprazole  40 MG tablet Commonly known as: Protonix    rosuvastatin  20 MG tablet Commonly known as: CRESTOR    Vitamin D  (Ergocalciferol ) 1.25 MG (50000 UNIT) Caps capsule Commonly known as: DRISDOL       TAKE these medications    amLODipine  10 MG tablet Commonly known as: NORVASC  Take 1 tablet (10 mg total) by mouth daily.   ascorbic acid  500 MG tablet Commonly known as: VITAMIN C  Take 1 tablet (500 mg total) by mouth 2 (two) times daily.   carvedilol  25 MG tablet Commonly known as: COREG  Take 1 tablet (25 mg total) by mouth 2 (two) times daily.   Daily-Vite Multivitamin Tabs Take 1 tablet by mouth daily. Start taking on: June 25, 2024   daptomycin  IVPB Commonly known as: CUBICIN  Inject 700 mg into the vein daily for 5 days. Indication:  Polymicrobial diabetic foot infection  First Dose: Yes Last Day of Therapy:  06/30/2024 Labs - Once weekly:  CBC/D, CMP, and CPK Fax weekly lab results  promptly to [phone]  Method of administration: IV Push Method of administration may be changed at the discretion of home infusion pharmacist based upon assessment of the patient and/or caregiver's ability to self-administer the medication ordered. Please leave PIC in place until doctor has seen patient or been notified Call [phone] with any questions or critical value Start taking on: June 25, 2024   Ensure Max Protein Liqd Take 330 mLs (11 oz total) by mouth 2 (two) times daily.   FreeStyle Libre 3 Sensor Misc :1 Each Every 2 Weeks   HumaLOG  Mix 75/25 KwikPen (75-25) 100 UNIT/ML KwikPen Generic drug: Insulin  Lispro Prot & Lispro Inject 20 Units into the skin 2 (two) times daily with a meal. What changed: when to take this   Insupen Pen Needles 32G X 4 MM Misc Generic drug: Insulin  Pen Needle Use as directed with insulin  pens. What changed:  medication strength when to take this   losartan  100 MG tablet Commonly  known as: COZAAR  Take 1 tablet (100 mg total) by mouth daily.   meropenem  IVPB Commonly known as: MERREM  Inject 1 g into the vein every 12 (twelve) hours for 6 days. Indication:  Polymicrobial diabetic foot infection  First Dose: Yes Last Day of Therapy:  06/30/2024 Labs - Once weekly:  CBC/D, CMP, and CPK Fax weekly lab results  promptly to [phone]  Method of administration: Mini-Bag Plus / Gravity Method of administration may be changed at the discretion of home infusion pharmacist based upon assessment of the patient and/or caregiver's ability to self-administer the medication ordered. Please leave PIC in place until doctor has seen patient or been notified Call [phone] with any questions or critical value   oxyCODONE -acetaminophen  5-325 MG tablet Commonly known as: PERCOCET/ROXICET Take 1-2 tablets by mouth every 6 (six) hours as needed for severe pain (pain score 7-10) or moderate pain (pain score 4-6).  Durable Medical Equipment  (From admission, onward)           Start     Ordered   06/21/24 1523  For home use only DME Negative pressure wound device  Once       Question Answer Comment  Frequency of dressing change 2 times per week   Length of need 6 Months   Dressing type Foam   Amount of suction 125 mm/Hg   Pressure application Continuous pressure   Supplies 10 canisters and 15 dressings per month for duration of therapy      06/21/24 1522              Discharge Care Instructions  (From admission, onward)           Start     Ordered   06/24/24 0000  Change dressing on IV access line weekly and PRN  (Home infusion instructions - [facility] )        06/24/24 0915            Follow-up Information     Imseis, Fairy Lot, MD. Schedule an appointment as soon as possible for a visit in 1 week(s).   Specialty: Internal Medicine Contact information: [address] [place] [address] [phone]          Fayette Bodily, MD. Go on 07/06/2024.   Specialty: Infectious Diseases Why: at 9:15 am Contact information: [address] [place] [address] [phone]         Malvin Marsa FALCON, DPM. Schedule an appointment as soon as possible for a visit in 1 week(s).   Specialty: Podiatry Why: diabetic foot post op follow up Contact information: [address] [place] [address] [phone]         Marcelino Gales, MD. Call in 2 week(s).   Specialty: Nephrology Why: for CKDIIIb evaluation Contact information: [address] [place] [facility] [place] [address] [phone]                Discharge Exam: Filed Weights   06/20/24 1945 06/24/24 0500  Weight: 110.7 kg 109.3 kg   GENERAL:  53 y.o.-year-old patient with no acute distress.  LUNGS: Normal breath sounds bilaterally CARDIOVASCULAR: S1, S2 normal. No murmur   ABDOMEN: Soft, nontender, nondistended.  EXTREMITIES: POD #1 on 06/23/2024 NEUROLOGIC: nonfocal  patient is alert and awake SKIN: as above  Condition at discharge: fair  The results of significant diagnostics from this hospitalization (including imaging, microbiology, ancillary and laboratory) are listed below for reference.   Imaging Studies: US  EKG SITE RITE Result Date: 06/23/2024 If Site Rite image not attached, placement could not be confirmed due to current cardiac rhythm.  DG Foot Complete Left Result Date: 06/22/2024 Please see detailed radiograph report in office note.  DG Foot 2 Views Left Result Date: 06/21/2024 CLINICAL DATA:  Postop. EXAM: LEFT FOOT - 2 VIEW COMPARISON:  06/20/2024 FINDINGS: Resection of the proximal fifth metatarsal. Overlying wound VAC in place. Cortical thickening at the base of the fourth metatarsal. Stable foot alignment. IMPRESSION: Resection of the proximal fifth metatarsal. Electronically Signed   By: Andrea Gasman M.D.   On: 06/21/2024 16:58   MR FOOT LEFT W WO  CONTRAST Result Date: 06/21/2024 CLINICAL DATA:  Plantar foot ulcer/wound status post irrigation and surgical debridement with purulent abscess of the left plantar midfoot. EXAM: MRI OF THE LEFT FOREFOOT WITHOUT AND WITH CONTRAST TECHNIQUE: Multiplanar, multisequence MR imaging of the left foot  was performed both before and after administration of intravenous contrast. CONTRAST:  10mL GADAVIST  GADOBUTROL  1 MMOL/ML IV SOLN COMPARISON:  MRI of the left foot dated 05/15/2024. Left foot radiographs dated 06/20/2024. FINDINGS: Compared to the prior exam dated 05/15/2024, there is a new soft tissue wound along the plantar medial hindfoot/midfoot junction with underlying complex peripherally enhancing fluid collection, most concerning for abscess, measuring approximately 3.2 cm AP x 3.0 cm TR x 2.6 cm craniocaudal (series 18, images 15 and 16 series 15, image 11). This collection appears to communicate with the overlying plantar wound and contains foci of central susceptibility artifact, favored to represent soft tissue air, which could be secondary to intervention/instrumentation or gas-forming infection. This collection extends from the plantar subcutaneous fat insinuates through the underlying deep soft tissues involving the flexor digitorum brevis, abductor digiti minimi, and quadratus plantae muscles with marked associated edema and enhancement, compatible with myositis (series 18, image 14). New peripherally enhancing T2 hyperintense collection within the proximal aspect of the abductor digiti minimi muscle measures 1.7 cm AP x 0.6 cm TR by 0.5 cm craniocaudal (series 17, image 26 and series 13, image 22), abscess is not excluded. Redemonstrated wound with postoperative changes along the lateral and plantar midfoot/hindfoot junction with surrounding soft tissue edema and enhancement. No discrete loculated fluid collection identified in this region. Compared to the prior exam there is markedly increased T2  hyperintense signal abnormality and corresponding T1 hypointensity and enhancement with progressive osteolysis involving the base and proximal shaft of the fifth metatarsal, compatible with osteomyelitis (series 12 and 17, image 27). The fourth metatarsal base demonstrates mildly increased T2 hyperintense signal abnormality with mild enhancement, which could reflect reactive marrow changes, however, osteomyelitis can not be excluded. Degenerative arthropathy of the second, third, and fourth TMT joints. IMPRESSION: 1. Compared to the prior exam dated 05/15/2024, there is a new soft tissue wound along the plantar medial hindfoot/midfoot junction with underlying complex peripherally enhancing fluid collection, most concerning for abscess, measuring approximately 3.2 cm AP x 3.0 cm TR x 2.6 cm CC. This collection appears to communicate with the overlying plantar wound and contains foci of central susceptibility artifact, favored to represent soft tissue air, which could be secondary to recent intervention/instrumentation or gas-forming infection. This collection insinuates from the plantar subcutaneous fat through the underlying deep soft tissues involving the flexor digitorum brevis, abductor digiti minimi, and quadratus plantae muscles with associated myositis and cellulitis. 2. Redemonstrated wound with postoperative changes along the lateral and plantar midfoot/hindfoot junction with surrounding soft tissue edema and enhancement, compatible with cellulitis. No discrete loculated fluid collection identified in this region. Compared to the prior exam there is markedly increased signal abnormality with enhancement and progressive osteolysis involving the base and proximal shaft of the fifth metatarsal, compatible with osteomyelitis. The fourth metatarsal base demonstrates mildly increased edema like signal abnormality with mild enhancement, which could reflect reactive marrow changes, however, osteomyelitis can not be  excluded. 3. New peripherally enhancing collection within the proximal abductor digiti minimi muscle measuring 1.7 x 0.6 x 0.5 cm, abscess is not excluded. Electronically Signed   By: Harrietta Sherry M.D.   On: 06/21/2024 10:27    Microbiology: Results for orders placed or performed during the hospital encounter of 06/20/24  Blood culture (routine x 2)     Status: None (Preliminary result)   Collection Time: 06/21/24 12:13 AM   Specimen: BLOOD  Result Value Ref Range Status   Specimen Description BLOOD BLOOD LEFT ARM  Final  Special Requests   Final    BOTTLES DRAWN AEROBIC AND ANAEROBIC Blood Culture adequate volume   Culture   Final    NO GROWTH 3 DAYS Performed at [facility], [address] [place], [place] [address]    Report Status PENDING  Incomplete  Blood culture (routine x 2)     Status: None (Preliminary result)   Collection Time: 06/21/24 12:13 AM   Specimen: BLOOD  Result Value Ref Range Status   Specimen Description BLOOD BLOOD RIGHT ARM  Final   Special Requests   Final    BOTTLES DRAWN AEROBIC AND ANAEROBIC Blood Culture results may not be optimal due to an inadequate volume of blood received in culture bottles   Culture   Final    NO GROWTH 3 DAYS Performed at [facility], [address] [place], [place] [address]    Report Status PENDING  Incomplete  MRSA Next Gen by PCR, Nasal     Status: None   Collection Time: 06/21/24  7:48 AM   Specimen: Nasal Mucosa; Nasal Swab  Result Value Ref Range Status   MRSA by PCR Next Gen NOT DETECTED NOT DETECTED Final    Comment: (NOTE) The GeneXpert MRSA Assay (FDA approved for NASAL specimens only), is one component of a comprehensive MRSA colonization surveillance program. It is not intended to diagnose MRSA infection nor to guide or monitor treatment for MRSA infections. Test performance is not FDA approved in patients less than 80 years old. Performed at [facility], [address] [place], [place] [address]   Aerobic/Anaerobic Culture w Gram Stain (surgical/deep wound)     Status: None (Preliminary result)   Collection Time: 06/21/24  2:42 PM   Specimen: Wound  Result Value Ref Range Status   Specimen Description   Final    ABSCESS Performed at [facility], [address], [place], [place] [address]    Special Requests   Final    NONE Performed at [facility], [address] [place], [place] [address]    Gram Stain NO WBC SEEN RARE GRAM NEGATIVE RODS   Final   Culture   Final    MODERATE ENTEROCOCCUS FAECALIS CULTURE REINCUBATED FOR BETTER GROWTH NO ANAEROBES ISOLATED; CULTURE IN PROGRESS FOR 5 DAYS SUSCEPTIBILITIES TO FOLLOW Performed at [facility], [address], [place], [place] [address]    Report Status PENDING  Incomplete  Aerobic/Anaerobic Culture w Gram Stain (surgical/deep wound)     Status: None (Preliminary result)   Collection Time: 06/21/24  2:44 PM   Specimen: Wound; Tissue  Result Value Ref Range Status   Specimen Description BONE  Final   Special Requests 5TH METATARSAL BASE LEFT FOOT SPEC D  Final   Gram Stain   Final    RARE WBC PRESENT, PREDOMINANTLY PMN NO ORGANISMS SEEN    Culture   Final    RARE ACINETOBACTER BAUMANNII RARE STAPHYLOCOCCUS AUREUS SUSCEPTIBILITIES TO FOLLOW Performed at [facility], [address] [place], [place] [address]    Report Status PENDING  Incomplete  Aerobic/Anaerobic Culture w Gram Stain (surgical/deep wound)     Status: None (Preliminary result)   Collection Time: 06/21/24  2:47 PM   Specimen: Bone; Tissue  Result Value Ref Range Status   Specimen Description TISSUE  Final   Special Requests LEFT MID FOOT  Final   Gram Stain   Final    ABUNDANT WBC PRESENT, PREDOMINANTLY PMN RARE GRAM  NEGATIVE RODS RARE GRAM POSITIVE COCCI Performed at [facility], [address], [place], [place] [address]    Culture   Final    ABUNDANT ENTEROCOCCUS FAECALIS NO  ANAEROBES ISOLATED; CULTURE IN PROGRESS FOR 5 DAYS    Report Status PENDING  Incomplete    Labs: CBC: Recent Labs  Lab 06/20/24 1948 06/22/24 0359  WBC 8.2 8.3  NEUTROABS 6.0  --   HGB 12.6* 10.3*  HCT 38.5* 31.2*  MCV 94.8 95.7  PLT 267 212   Basic Metabolic Panel: Recent Labs  Lab 06/20/24 1948 06/22/24 0359  NA 132* 136  K 4.4 4.0  CL 102 105  CO2 20* 23  GLUCOSE 342* 164*  BUN 35* 26*  CREATININE 2.29* 2.20*  CALCIUM  9.2 8.6*   Liver Function Tests: No results for input(s): AST, ALT, ALKPHOS, BILITOT, PROT, ALBUMIN in the last 168 hours. CBG: Recent Labs  Lab 06/23/24 0826 06/23/24 1159 06/23/24 1616 06/23/24 2107 06/24/24 0804  GLUCAP 125* 133* 131* 105* 78    Discharge time spent: greater than 30 minutes.  Signed: Leita Blanch, MD [facility] 06/24/2024

## 2024-06-26 LAB — CULTURE, BLOOD (ROUTINE X 2)
Culture: NO GROWTH
Culture: NO GROWTH
Special Requests: ADEQUATE

## 2024-06-26 LAB — AEROBIC/ANAEROBIC CULTURE W GRAM STAIN (SURGICAL/DEEP WOUND): Gram Stain: NONE SEEN

## 2024-06-26 LAB — SURGICAL PATHOLOGY

## 2024-06-27 ENCOUNTER — Telehealth: Payer: Self-pay | Admitting: Infectious Diseases

## 2024-06-27 LAB — AEROBIC/ANAEROBIC CULTURE W GRAM STAIN (SURGICAL/DEEP WOUND)

## 2024-06-27 NOTE — Telephone Encounter (Signed)
 Pam and Massie with [employer] informed of patient's IV abx extension and verbalized understanding.  Clayton [name], CMA

## 2024-06-27 NOTE — Telephone Encounter (Signed)
 Called patient to let him know that he will continue on dapto amd meropenem  for 3 more weeks- He said that he never got dapto delivered to his house- I informed pam with the infusion company

## 2024-06-29 ENCOUNTER — Ambulatory Visit: Payer: Self-pay | Admitting: Infectious Diseases

## 2024-07-04 ENCOUNTER — Ambulatory Visit
Admission: RE | Admit: 2024-07-04 | Discharge: 2024-07-04 | Disposition: A | Discharge status: Home / Self Care | Payer: MEDICAID | Source: Ambulatory Visit | Attending: Infectious Diseases | Admitting: Infectious Diseases

## 2024-07-04 ENCOUNTER — Ambulatory Visit: Payer: Self-pay | Admitting: Podiatry

## 2024-07-04 DIAGNOSIS — Z452 Encounter for adjustment and management of vascular access device: Secondary | ICD-10-CM | POA: Insufficient documentation

## 2024-07-04 MED ORDER — HEPARIN SOD (PORK) LOCK FLUSH 100 UNIT/ML IV SOLN: 250.0000 [IU] | INTRAVENOUS | Status: DC | PRN

## 2024-07-04 NOTE — Progress Notes (Addendum)
 Pt seen in Short Stay at [facility];  He has a single lumen PICC, placed 06/23/24; pt was seen because the picc won't flush; the  extension was removed; picc has excellent blood return and was flushed with 20cc NS;  new primed extension placed;  insertion site wnl; pt to resume iv meds at home.

## 2024-07-06 ENCOUNTER — Ambulatory Visit: Payer: MEDICAID | Attending: Infectious Diseases | Admitting: Infectious Diseases

## 2024-07-06 VITALS — BP 137/83 | HR 79

## 2024-07-06 DIAGNOSIS — L089 Local infection of the skin and subcutaneous tissue, unspecified: Secondary | ICD-10-CM

## 2024-07-06 DIAGNOSIS — D649 Anemia, unspecified: Secondary | ICD-10-CM | POA: Insufficient documentation

## 2024-07-06 DIAGNOSIS — E1142 Type 2 diabetes mellitus with diabetic polyneuropathy: Secondary | ICD-10-CM | POA: Insufficient documentation

## 2024-07-06 DIAGNOSIS — B952 Enterococcus as the cause of diseases classified elsewhere: Secondary | ICD-10-CM

## 2024-07-06 DIAGNOSIS — Z794 Long term (current) use of insulin: Secondary | ICD-10-CM

## 2024-07-06 DIAGNOSIS — Z79899 Other long term (current) drug therapy: Secondary | ICD-10-CM | POA: Insufficient documentation

## 2024-07-06 DIAGNOSIS — E669 Obesity, unspecified: Secondary | ICD-10-CM | POA: Insufficient documentation

## 2024-07-06 DIAGNOSIS — E1151 Type 2 diabetes mellitus with diabetic peripheral angiopathy without gangrene: Secondary | ICD-10-CM

## 2024-07-06 DIAGNOSIS — N189 Chronic kidney disease, unspecified: Secondary | ICD-10-CM

## 2024-07-06 DIAGNOSIS — L02619 Cutaneous abscess of unspecified foot: Secondary | ICD-10-CM

## 2024-07-06 DIAGNOSIS — E11628 Type 2 diabetes mellitus with other skin complications: Secondary | ICD-10-CM

## 2024-07-06 DIAGNOSIS — B9683 Acinetobacter baumannii as the cause of diseases classified elsewhere: Secondary | ICD-10-CM

## 2024-07-06 DIAGNOSIS — I129 Hypertensive chronic kidney disease with stage 1 through stage 4 chronic kidney disease, or unspecified chronic kidney disease: Secondary | ICD-10-CM | POA: Insufficient documentation

## 2024-07-06 DIAGNOSIS — M14679 Charcot's joint, unspecified ankle and foot: Secondary | ICD-10-CM | POA: Insufficient documentation

## 2024-07-06 DIAGNOSIS — B9561 Methicillin susceptible Staphylococcus aureus infection as the cause of diseases classified elsewhere: Secondary | ICD-10-CM

## 2024-07-06 DIAGNOSIS — E1122 Type 2 diabetes mellitus with diabetic chronic kidney disease: Secondary | ICD-10-CM

## 2024-07-06 DIAGNOSIS — Z5971 Insufficient health insurance coverage: Secondary | ICD-10-CM | POA: Insufficient documentation

## 2024-07-06 DIAGNOSIS — E1169 Type 2 diabetes mellitus with other specified complication: Secondary | ICD-10-CM | POA: Insufficient documentation

## 2024-07-06 NOTE — Progress Notes (Signed)
 NAME: Clayton [name]  DOB: 1971-08-16  MRN: [id number]  Date/Time: 07/06/2024 11:00 AM   Subjective:  Clayton [name] is a 53 y.o. with a history of hypertension, diabetes, CKD, obesity   Patient is here as follow-up after recent hospitalization for left foot infection He was at [facility] 06/20/24-06/24/24 And underwent I/D of plantar abscess and partial 5th metatarsal base resection and bone biopsy of 4th met Culture was positive for MSSA< enterococcus fecalis and acinetobacter HE was discharged home on IV meropenem  and daptomycin  amputation, Pathology was negative for osteo of both 5th/4th Toes Previuosly he was in [facility] between  05/15/2024 until 05/21/2024 for left foot wound and DKA   underwent debridement on 05/15/2024 and the wound culture was Staphylococcus aureus, Streptococcus mitis and moderate Prevotella melena [name] which was beta-lactamase positive.  The blood culture was negative.  The bone pathology from the left cuboid was acute osteomyelitis. As patient did not have insurance he did not want to go on IV antibiotics home.  So he was given long-acting dalbavancin on 05/19/2024 and then 05/26/2024.  He is also on Flagyl  500 mg p.o. twice daily.    Today he is doing fine He is giving meropenem  and dapto every day He had PICC problems but it was resolved yesterday when he came to the day surgery  Past Medical History:  Diagnosis Date   Asthma    CKD (chronic kidney disease), stage III (HCC)    Hypertension    Type II diabetes mellitus with renal manifestations [facility])     Past Surgical History:  Procedure Laterality Date   APPLICATION OF WOUND VAC Left 05/15/2024   Procedure: APPLICATION, WOUND VAC;  Surgeon: Malvin Marsa FALCON, DPM;  Location: [facility] ORS;  Service: Orthopedics/Podiatry;  Laterality: Left;   APPLICATION OF WOUND VAC Left 05/17/2024   Procedure: APPLICATION, WOUND VAC;  Surgeon: Malvin Marsa FALCON, DPM;  Location: [facility] ORS;  Service: Orthopedics/Podiatry;   Laterality: Left;   CATARACT EXTRACTION     GRAFT APPLICATION Left 05/17/2024   Procedure: GRAFT APPLICATION;  Surgeon: Malvin Marsa FALCON, DPM;  Location: [facility] ORS;  Service: Orthopedics/Podiatry;  Laterality: Left;   IRRIGATION AND DEBRIDEMENT ABSCESS Left 06/21/2024   Procedure: IRRIGATION AND DEBRIDEMENT ABSCESS;  Surgeon: Malvin Marsa FALCON, DPM;  Location: [facility] ORS;  Service: Orthopedics/Podiatry;  Laterality: Left;  Left Foot wound vac applied   IRRIGATION AND DEBRIDEMENT FOOT Left 05/15/2024   Procedure: IRRIGATION AND DEBRIDEMENT FOOT;  Surgeon: Malvin Marsa FALCON, DPM;  Location: [facility] ORS;  Service: Orthopedics/Podiatry;  Laterality: Left;  WITH BONE BIOPSY   IRRIGATION AND DEBRIDEMENT FOOT Left 05/17/2024   Procedure: IRRIGATION AND DEBRIDEMENT FOOT;  Surgeon: Malvin Marsa FALCON, DPM;  Location: [facility] ORS;  Service: Orthopedics/Podiatry;  Laterality: Left;    Social History   Socioeconomic History   Marital status: Married    Spouse name: Not on file   Number of children: Not on file   Years of education: Not on file   Highest education level: Not on file  Occupational History   Not on file  Tobacco Use   Smoking status: Never   Smokeless tobacco: Never  Substance and Sexual Activity   Alcohol use: Yes   Drug use: Not Currently   Sexual activity: Yes  Other Topics Concern   Not on file  Social History Narrative   Not on file   Social Drivers of Health   Financial Resource Strain: Low Risk  (07/07/2023)   Received from [facility]   Overall  Financial Resource Strain (CARDIA)    Difficulty of Paying Living Expenses: Not very hard  Food Insecurity: No Food Insecurity (06/21/2024)   Hunger Vital Sign    Worried About Running Out of Food in the Last Year: Never true    Ran Out of Food in the Last Year: Never true  Transportation Needs: No Transportation Needs (06/21/2024)   PRAPARE - Administrator, Civil Service (Medical): No     Lack of Transportation (Non-Medical): No  Physical Activity: Not on file  Stress: Not on file  Social Connections: Not on file  Intimate Partner Violence: Not At Risk (06/21/2024)   Humiliation, Afraid, Rape, and Kick questionnaire    Fear of Current or Ex-Partner: No    Emotionally Abused: No    Physically Abused: No    Sexually Abused: No    Family History  Problem Relation Age of Onset   Stroke Mother    Hypertension Mother    Hypertension Father    No Known Allergies I? Current Outpatient Medications  Medication Sig Dispense Refill   amLODipine  (NORVASC ) 10 MG tablet Take 1 tablet (10 mg total) by mouth daily. 90 tablet 1   ascorbic acid  (VITAMIN C ) 500 MG tablet Take 1 tablet (500 mg total) by mouth 2 (two) times daily. 90 tablet 1   carvedilol  (COREG ) 25 MG tablet Take 1 tablet (25 mg total) by mouth 2 (two) times daily. 180 tablet 1   Continuous Glucose Sensor (FREESTYLE LIBRE 3 SENSOR) MISC :1 Each Every 2 Weeks     Ensure Max Protein (ENSURE MAX PROTEIN) LIQD Take 330 mLs (11 oz total) by mouth 2 (two) times daily. 237 mL 0   Insulin  Lispro Prot & Lispro (HUMALOG  75/25 MIX) (75-25) 100 UNIT/ML Kwikpen Inject 20 Units into the skin 2 (two) times daily with a meal. 15 mL 11   Insulin  Pen Needle 32G X 4 MM MISC Use as directed with insulin  pens. 100 each 11   losartan  (COZAAR ) 100 MG tablet Take 1 tablet (100 mg total) by mouth daily. 90 tablet 2   Multiple Vitamin (MULTIVITAMIN WITH MINERALS) TABS tablet Take 1 tablet by mouth daily. 30 tablet 3   oxyCODONE -acetaminophen  (PERCOCET/ROXICET) 5-325 MG tablet Take 1-2 tablets by mouth every 6 (six) hours as needed for severe pain (pain score 7-10) or moderate pain (pain score 4-6). 30 tablet 0   No current facility-administered medications for this visit.     Abtx:  Anti-infectives (From admission, onward)    None       REVIEW OF SYSTEMS:  Const: negative fever, negative chills, negative weight loss Eyes: negative  diplopia or visual changes, negative eye pain ENT: negative coryza, negative sore throat Resp: negative cough, hemoptysis, dyspnea Cards: negative for chest pain, palpitations, lower extremity edema GU: negative for frequency, dysuria and hematuria GI: Negative for abdominal pain, diarrhea, bleeding, constipation Skin: negative for rash and pruritus Heme: negative for easy bruising and gum/nose bleeding MS: As above Neurolo:negative for headaches, dizziness, vertigo, memory problems  Psych: negative for feelings of anxiety, depression  Endocrine:  diabetes Allergy/Immunology- negative for any medication or food allergies ? Objective:  VITALS:  BP 137/83   Pulse 79   SpO2 97%    PHYSICAL EXAM:  General: Alert, cooperative, no distress, appears stated age.  Head: Normocephalic, without obvious abnormality, atraumatic. Eyes: Conjunctivae clear, anicteric sclerae. Pupils are equal ENT Nares normal. No drainage or sinus tenderness. Lips, mucosa, and tongue normal. No Thrush Neck: Supple,  symmetrical, no adenopathy, thyroid: non tender no carotid bruit and no JVD. Back: No CVA tenderness. Lungs: Clear to auscultation bilaterally. No Wheezing or Rhonchi. No rales. Heart: Regular rate and rhythm, no murmur, rub or gallop. Abdomen: Did not examine  extremities: Left foot wound vac removed New surgical wound on the mid plantar is clean , with granulation tissue The old sugical woud on the lateral foot also is healthy Inbetween the skin is macerated fue to a continuous sponge    Rt picc site fine  Skin: No rashes or lesions. Or bruising Lymph: Cervical, supraclavicular normal. Neurologic: Grossly non-focal Pertinent Labs Lab Results CBC   9/4 cr 2.20 9/9 cr1.78  9/9 wbc 6.3 HB 10.9 PLT 267 CK 74     ? Impression/Recommendation Diabetes mellitus foot infection on the left with peripheral neuropathy and possible Charcot foot Ulcer on the cuboid bone underwent   debridement on 05/15/24 and got 2 doses of dalbavancin for osteo and he  got a wound VAC Culture of the wound was Staph aureus and strep mitis and anaerobes Patient received 2 intravenous infusion of dalbavancin on 8 /1 and 8/8.   He  also got  metronidazole  500 mg p.o. twice daily for 2 weeks HE was on  cefadroxil  500 mg p.o. twice daily from Saturday 8/16 While on cefadroxil  he developed a new plantar abscess and was readmitted and underwent I/D and partial resection of 5th met base resection on 06/21/24 Culture was acinetobacter, MSSA, enterococcus fecalis He was disharged  06/24/24 on Meropenem  and Daptomycin  for a total of 4 weeks. There was no osteo on the bone sampled- but still decided to treat for 4 weeks ( 10/3)  because of complicated foot infection with ongoing infection of 2 months ( may extend if needed) The wounds are looking so much  better There is maceration of the skin between- will discuss with his podiatrist regarding 2 separate sponges- though he does not want to pay for another wound vac machine as he is self pay  CKD last cr improved at 1.78  Diabetes mellitus  Anemia Last Hb 10.9 ( was 6.2 once before)    ________________________________________________ Discussed with patient in detail Follow-up 2weeks

## 2024-07-10 ENCOUNTER — Telehealth: Payer: Self-pay | Admitting: Lab

## 2024-07-10 NOTE — Telephone Encounter (Signed)
 Nurse called stated went to see patient for wound care today 07/10/2024 and did not attach wound vac due to skin macerated and would like provider to see on 07/11/2024 visit did wet to dry wound care change.

## 2024-07-11 ENCOUNTER — Encounter: Payer: Self-pay | Admitting: Podiatry

## 2024-07-11 ENCOUNTER — Ambulatory Visit (INDEPENDENT_AMBULATORY_CARE_PROVIDER_SITE_OTHER): Payer: MEDICAID | Admitting: Podiatry

## 2024-07-11 VITALS — Ht 72.0 in | Wt 241.0 lb

## 2024-07-11 DIAGNOSIS — E11621 Type 2 diabetes mellitus with foot ulcer: Secondary | ICD-10-CM

## 2024-07-11 DIAGNOSIS — L97422 Non-pressure chronic ulcer of left heel and midfoot with fat layer exposed: Secondary | ICD-10-CM

## 2024-07-11 NOTE — Progress Notes (Signed)
 Chief Complaint  Patient presents with   Wound Check    Pt is here to f/u on left foot due to ulcer.    Subjective:  Patient presents today status post LT foot irrigation and debridement w/ application of dermal allograft and wound vac application. Inpatient at [facility]. Dr. Marolyn Honour. DOS: 05/17/2024.   Patient was doing well however he developed a recurrent abscess to the plantar aspect of the foot.  Readmitted to the hospital for I&D.  DOS: 06/21/2024.  Dr. Marolyn Honour  Past Medical History:  Diagnosis Date   Asthma    CKD (chronic kidney disease), stage III (HCC)    Hypertension    Type II diabetes mellitus with renal manifestations [facility]     Past Surgical History:  Procedure Laterality Date   APPLICATION OF WOUND VAC Left 05/15/2024   Procedure: APPLICATION, WOUND VAC;  Surgeon: Honour Marsa FALCON, DPM;  Location: [facility] OR[facility];  Service: Orthopedics/Podiatry;  Laterality: Left;   APPLICATION OF WOUND VAC Left 05/17/2024   Procedure: APPLICATION, WOUND VAC;  Surgeon: Honour Marsa FALCON, DPM;  Location: [facility] OR[facility];  Service: Orthopedics/Podiatry;  Laterality: Left;   CATARACT EXTRACTION     GRAFT APPLICATION Left 05/17/2024   Procedure: GRAFT APPLICATION;  Surgeon: Honour Marsa FALCON, DPM;  Location: [facility];  Service: Orthopedics/Podiatry;  Laterality: Left;   IRRIGATION AND DEBRIDEMENT ABSCESS Left 06/21/2024   Procedure: IRRIGATION AND DEBRIDEMENT ABSCESS;  Surgeon: Honour Marsa FALCON, DPM;  Location: [facility];  Service: Orthopedics/Podiatry;  Laterality: Left;  Left Foot wound vac applied   IRRIGATION AND DEBRIDEMENT FOOT Left 05/15/2024   Procedure: IRRIGATION AND DEBRIDEMENT FOOT;  Surgeon: Honour Marsa FALCON, DPM;  Location: [facility];  Service: Orthopedics/Podiatry;  Laterality: Left;  WITH BONE BIOPSY   IRRIGATION AND DEBRIDEMENT FOOT Left 05/17/2024   Procedure: IRRIGATION AND DEBRIDEMENT FOOT;  Surgeon: Honour Marsa FALCON, DPM;   Location: [facility];  Service: Orthopedics/Podiatry;  Laterality: Left;    No Known Allergies     LT foot 06/06/2024   LT plantar foot 07/11/2024  LT foot 07/11/2024  Objective/Physical Exam Ulcer to the lateral aspect of the left foot appears stable with granular wound base and serosanguineous drainage.  No purulence today.  No malodor.  Open wound also noted to the plantar aspect of the left foot from most recent incision and drainage surgery.  It does extend into the deeper layers of the foot.  Again, no malodor.  Serosanguineous drainage noted.  Please see above noted photo  MR FOOT LEFT W WO CONTRAST 05/15/2024 IMPRESSION: 1. Large open wound on the plantar and lateral aspect of the midfoot hindfoot junction region. 2. Diffuse cellulitis and myofasciitis without definite findings for pyomyositis. 3. Fluid collection wrapping around the bases of the fourth and fifth metatarsals dorsally could be postoperative fluid collection or dissecting abscess. 4. 14 x 13 mm cystic area in the plantar subcutaneous fat slightly more distally. I do not see any surrounding inflammatory changes to suggest this is an abscess but a small abscess is possible. 5. No definite MR findings to suggest osteomyelitis or septic arthritis.  Assessment: 1. s/p . LT foot irrigation and debridement w/ application of dermal allograft and wound vac application. Inpatient at [facility]. Dr. Marolyn Honour. DOS: 05/17/2024.  2. Repeat I&D LT foot.  Inpatient at [facility].  Dr. Marolyn Honour.  DOS: 06/21/2024   Plan of Care:  -Patient was evaluated. - Wounds appear stable -Continue antibiotics as recommended by ID.  Greatly  appreciated -Continue wound vac dressing changes focused to the plantar foot wound.  If home health nurse is unable to apply the wound VAC to both wounds recommend nonadherent Xeroform gauze to the lateral wound.  Supplies provided -Return to clinic 4 weeks   Thresa EMERSON Sar,  DPM [facility]  Dr. Thresa EMERSON Sar, DPM    [address] [place], [place] [address]                Office [phone]  Fax [phone]

## 2024-07-12 ENCOUNTER — Ambulatory Visit
Admission: RE | Admit: 2024-07-12 | Discharge: 2024-07-12 | Disposition: A | Discharge status: Home / Self Care | Payer: MEDICAID | Source: Ambulatory Visit | Attending: Infectious Diseases | Admitting: Infectious Diseases

## 2024-07-12 NOTE — Progress Notes (Signed)
 There was a consult for occluded PICC line with home infusion. Patient stated that flush is okay but no blood return. Assessed PICC line and it was positional. Good blood returns with arm is closed to his body site. If arm is away from the body then hard to flush & no blood returns. Informed patient's RN regarding this matter. HS [employer]

## 2024-07-20 ENCOUNTER — Ambulatory Visit: Payer: MEDICAID | Attending: Infectious Diseases | Admitting: Infectious Diseases

## 2024-07-20 ENCOUNTER — Encounter: Payer: Self-pay | Admitting: Infectious Diseases

## 2024-07-20 ENCOUNTER — Telehealth: Payer: Self-pay

## 2024-07-20 VITALS — BP 150/97 | HR 71 | Temp 98.8°F

## 2024-07-20 DIAGNOSIS — Z794 Long term (current) use of insulin: Secondary | ICD-10-CM

## 2024-07-20 DIAGNOSIS — I129 Hypertensive chronic kidney disease with stage 1 through stage 4 chronic kidney disease, or unspecified chronic kidney disease: Secondary | ICD-10-CM | POA: Insufficient documentation

## 2024-07-20 DIAGNOSIS — B9561 Methicillin susceptible Staphylococcus aureus infection as the cause of diseases classified elsewhere: Secondary | ICD-10-CM | POA: Diagnosis not present

## 2024-07-20 DIAGNOSIS — B9689 Other specified bacterial agents as the cause of diseases classified elsewhere: Secondary | ICD-10-CM

## 2024-07-20 DIAGNOSIS — N189 Chronic kidney disease, unspecified: Secondary | ICD-10-CM

## 2024-07-20 DIAGNOSIS — T8141XA Infection following a procedure, superficial incisional surgical site, initial encounter: Secondary | ICD-10-CM | POA: Insufficient documentation

## 2024-07-20 DIAGNOSIS — E1151 Type 2 diabetes mellitus with diabetic peripheral angiopathy without gangrene: Secondary | ICD-10-CM | POA: Diagnosis not present

## 2024-07-20 DIAGNOSIS — D631 Anemia in chronic kidney disease: Secondary | ICD-10-CM | POA: Insufficient documentation

## 2024-07-20 DIAGNOSIS — B952 Enterococcus as the cause of diseases classified elsewhere: Secondary | ICD-10-CM | POA: Insufficient documentation

## 2024-07-20 DIAGNOSIS — E11628 Type 2 diabetes mellitus with other skin complications: Secondary | ICD-10-CM

## 2024-07-20 DIAGNOSIS — E669 Obesity, unspecified: Secondary | ICD-10-CM | POA: Insufficient documentation

## 2024-07-20 DIAGNOSIS — Z89432 Acquired absence of left foot: Secondary | ICD-10-CM | POA: Insufficient documentation

## 2024-07-20 DIAGNOSIS — N183 Chronic kidney disease, stage 3 unspecified: Secondary | ICD-10-CM | POA: Insufficient documentation

## 2024-07-20 DIAGNOSIS — E1122 Type 2 diabetes mellitus with diabetic chronic kidney disease: Secondary | ICD-10-CM

## 2024-07-20 DIAGNOSIS — L089 Local infection of the skin and subcutaneous tissue, unspecified: Secondary | ICD-10-CM

## 2024-07-20 DIAGNOSIS — B957 Other staphylococcus as the cause of diseases classified elsewhere: Secondary | ICD-10-CM | POA: Diagnosis not present

## 2024-07-20 DIAGNOSIS — E1140 Type 2 diabetes mellitus with diabetic neuropathy, unspecified: Secondary | ICD-10-CM | POA: Insufficient documentation

## 2024-07-20 DIAGNOSIS — T8744 Infection of amputation stump, left lower extremity: Secondary | ICD-10-CM | POA: Insufficient documentation

## 2024-07-20 DIAGNOSIS — B9683 Acinetobacter baumannii as the cause of diseases classified elsewhere: Secondary | ICD-10-CM | POA: Insufficient documentation

## 2024-07-20 NOTE — Telephone Encounter (Signed)
 Per Dr.Ravishankar IV abx to be extended 1 more week 07/27/24. We will see him on that day as well. [employer] informed of orders as well.  Clayton [name], CMA

## 2024-07-20 NOTE — Progress Notes (Signed)
 NAME: Clayton [name]  DOB: Jan 09, 1971  MRN: [id number]  Date/Time: 07/20/2024 11:10 AM   Subjective:  Clayton [name] is a 53 y.o. with a history of hypertension, diabetes, CKD, obesity   Patient is here as follow-up after recent hospitalization for left foot infection He was at [facility] 06/20/24-06/24/24 And underwent I/D of plantar abscess and partial 5th metatarsal base resection and bone biopsy of 4th met Culture was positive for MSSA< enterococcus fecalis and acinetobacter HE was discharged home on IV meropenem  and daptomycin  amputation, Pathology was negative for osteo of both 5th/4th Toes He is doing much better Continuing on IV meropenem  and dapto and will complete 4 weeks tomorrow Wound vac was removed on Monday by the nurse as it was very dry with no drainage HE is using a knee scooter -does not weight bear   Previously he was in [facility] between  05/15/2024 until 05/21/2024 for left foot wound and DKA   underwent debridement on 05/15/2024 and the wound culture was Staphylococcus aureus, Streptococcus mitis and moderate Prevotella  which was beta-lactamase positive.  The blood culture was negative.  The bone pathology from the left cuboid was acute osteomyelitis. As patient did not have insurance he did not want to go on IV antibiotics home.  So he was given long-acting dalbavancin on 05/19/2024 and then 05/26/2024.  He is also on Flagyl  500 mg p.o. twice daily.     Past Medical History:  Diagnosis Date   Asthma    CKD (chronic kidney disease), stage III (HCC)    Hypertension    Type II diabetes mellitus with renal manifestations [facility])     Past Surgical History:  Procedure Laterality Date   APPLICATION OF WOUND VAC Left 05/15/2024   Procedure: APPLICATION, WOUND VAC;  Surgeon: Malvin Marsa FALCON, DPM;  Location: [facility];  Service: Orthopedics/Podiatry;  Laterality: Left;   APPLICATION OF WOUND VAC Left 05/17/2024   Procedure: APPLICATION, WOUND VAC;  Surgeon: Malvin Marsa FALCON,  DPM;  Location: [facility];  Service: Orthopedics/Podiatry;  Laterality: Left;   CATARACT EXTRACTION     GRAFT APPLICATION Left 05/17/2024   Procedure: GRAFT APPLICATION;  Surgeon: Malvin Marsa FALCON, DPM;  Location: [facility];  Service: Orthopedics/Podiatry;  Laterality: Left;   IRRIGATION AND DEBRIDEMENT ABSCESS Left 06/21/2024   Procedure: IRRIGATION AND DEBRIDEMENT ABSCESS;  Surgeon: Malvin Marsa FALCON, DPM;  Location: [facility];  Service: Orthopedics/Podiatry;  Laterality: Left;  Left Foot wound vac applied   IRRIGATION AND DEBRIDEMENT FOOT Left 05/15/2024   Procedure: IRRIGATION AND DEBRIDEMENT FOOT;  Surgeon: Malvin Marsa FALCON, DPM;  Location: [facility];  Service: Orthopedics/Podiatry;  Laterality: Left;  WITH BONE BIOPSY   IRRIGATION AND DEBRIDEMENT FOOT Left 05/17/2024   Procedure: IRRIGATION AND DEBRIDEMENT FOOT;  Surgeon: Malvin Marsa FALCON, DPM;  Location: [facility];  Service: Orthopedics/Podiatry;  Laterality: Left;    Social History   Socioeconomic History   Marital status: Married    Spouse name: Not on file   Number of children: Not on file   Years of education: Not on file   Highest education level: Not on file  Occupational History   Not on file  Tobacco Use   Smoking status: Never   Smokeless tobacco: Never  Substance and Sexual Activity   Alcohol use: Yes   Drug use: Not Currently   Sexual activity: Yes  Other Topics Concern   Not on file  Social History Narrative   Not on file   Social Drivers of Corporate investment banker  Strain: Low Risk  (07/07/2023)   Received from [facility]   Overall Financial Resource Strain (CARDIA)    Difficulty of Paying Living Expenses: Not very hard  Food Insecurity: No Food Insecurity (06/21/2024)   Hunger Vital Sign    Worried About Running Out of Food in the Last Year: Never true    Ran Out of Food in the Last Year: Never true  Transportation Needs: No Transportation Needs (06/21/2024)   PRAPARE  - Administrator, Civil Service (Medical): No    Lack of Transportation (Non-Medical): No  Physical Activity: Not on file  Stress: Not on file  Social Connections: Not on file  Intimate Partner Violence: Not At Risk (06/21/2024)   Humiliation, Afraid, Rape, and Kick questionnaire    Fear of Current or Ex-Partner: No    Emotionally Abused: No    Physically Abused: No    Sexually Abused: No    Family History  Problem Relation Age of Onset   Stroke Mother    Hypertension Mother    Hypertension Father    No Known Allergies I? Current Outpatient Medications  Medication Sig Dispense Refill   amLODipine  (NORVASC ) 10 MG tablet Take 1 tablet (10 mg total) by mouth daily. 90 tablet 1   ascorbic acid  (VITAMIN C ) 500 MG tablet Take 1 tablet (500 mg total) by mouth 2 (two) times daily. 90 tablet 1   carvedilol  (COREG ) 25 MG tablet Take 1 tablet (25 mg total) by mouth 2 (two) times daily. 180 tablet 1   Continuous Glucose Sensor (FREESTYLE LIBRE 3 SENSOR) MISC :1 Each Every 2 Weeks     Ensure Max Protein (ENSURE MAX PROTEIN) LIQD Take 330 mLs (11 oz total) by mouth 2 (two) times daily. 237 mL 0   Insulin  Lispro Prot & Lispro (HUMALOG  75/25 MIX) (75-25) 100 UNIT/ML Kwikpen Inject 20 Units into the skin 2 (two) times daily with a meal. 15 mL 11   Insulin  Pen Needle 32G X 4 MM MISC Use as directed with insulin  pens. 100 each 11   losartan  (COZAAR ) 100 MG tablet Take 1 tablet (100 mg total) by mouth daily. 90 tablet 2   Multiple Vitamin (MULTIVITAMIN WITH MINERALS) TABS tablet Take 1 tablet by mouth daily. 30 tablet 3   oxyCODONE -acetaminophen  (PERCOCET/ROXICET) 5-325 MG tablet Take 1-2 tablets by mouth every 6 (six) hours as needed for severe pain (pain score 7-10) or moderate pain (pain score 4-6). 30 tablet 0   No current facility-administered medications for this visit.     Abtx:  Anti-infectives (From admission, onward)    None       REVIEW OF SYSTEMS:  Const: negative  fever, negative chills, negative weight loss Eyes: negative diplopia or visual changes, negative eye pain ENT: negative coryza, negative sore throat Resp: negative cough, hemoptysis, dyspnea Cards: negative for chest pain, palpitations, lower extremity edema GU: negative for frequency, dysuria and hematuria GI: Negative for abdominal pain, diarrhea, bleeding, constipation Skin: negative for rash and pruritus Heme: negative for easy bruising and gum/nose bleeding MS: As above  Neurolo:negative for headaches, dizziness, vertigo, memory problems  Psych: negative for feelings of anxiety, depression  Endocrine:  diabetes Allergy/Immunology- negative for any medication or food allergies ? Objective:  VITALS:  BP (!) 150/97   Pulse 71   Temp 98.8 F (37.1 C) (Temporal)   SpO2 98%    PHYSICAL EXAM:  General: Alert, cooperative, no distress, appears stated age.  Head: Normocephalic, without obvious abnormality,  atraumatic. Eyes: Conjunctivae clear, anicteric sclerae. Pupils are equal ENT Nares normal. No drainage or sinus tenderness. Lips, mucosa, and tongue normal. No Thrush Neck: Supple, symmetrical, no adenopathy, thyroid: non tender no carotid bruit and no JVD. Back: No CVA tenderness. Lungs: Clear to auscultation bilaterally. No Wheezing or Rhonchi. No rales. Heart: Regular rate and rhythm, no murmur, rub or gallop. Abdomen: Did not examine  extremities: Left foot dressing removed The wounds look so much better  07/20/24     07/06/24   07/06/24  Rt picc site fine  Skin: No rashes or lesions. Or bruising Lymph: Cervical, supraclavicular normal. Neurologic: Grossly non-focal Pertinent Labs Lab Results CBC   9/4 cr 2.20 9/9 cr1.78  9/9 wbc 6.3 HB 10.9 PLT 267 CK 74  07/17/2024 Creatinine 2.52 CK116 Hemoglobin 11.6   ? Impression/Recommendation Diabetes mellitus foot infection on the left with peripheral neuropathy and possible Charcot foot Ulcer on the  cuboid bone underwent  debridement on 05/15/24 and got 2 doses of dalbavancin for osteo and he  got a wound VAC Culture of the wound was Staph aureus and strep mitis and anaerobes Patient received 2 intravenous infusion of dalbavancin on 8 /1 and 8/8.   He  also got  metronidazole  500 mg p.o. twice daily for 2 weeks HE was on  cefadroxil  500 mg p.o. twice daily from Saturday 8/16 While on cefadroxil  he developed a new plantar abscess and was readmitted and underwent I/D and partial resection of 5th met base resection on 06/21/24 Culture was acinetobacter, MSSA, enterococcus fecalis He was disharged  06/24/24 on Meropenem  1 gram IV q 12  and Daptomycin  700mg  IV Q24 for a total of 4 weeks. There was no osteo on the bone sampled- but still decided to treat for 4 weeks ( until 10/3)  because of complicated foot infection with ongoing infection of 2 months .The wounds are looking so much  better The wound on the mid foot is much smaller- but not closed- will extend antibiotics by a week- he will see Dr.Evans next week and I will discuss with him  CKD  cr up to 2.52 CrCl 30- ask him to stay well hydrated- will check next week [address]  Diabetes mellitus  Anemia Last Hb 11.6 ( was 6.2 once before)    ________________________________________________ Discussed with patient in detail Follow-up 1 week

## 2024-07-21 ENCOUNTER — Encounter: Payer: Self-pay | Admitting: Lab

## 2024-07-25 ENCOUNTER — Other Ambulatory Visit: Payer: Self-pay

## 2024-07-25 DIAGNOSIS — L02612 Cutaneous abscess of left foot: Secondary | ICD-10-CM | POA: Diagnosis not present

## 2024-07-27 ENCOUNTER — Ambulatory Visit: Payer: MEDICAID | Attending: Infectious Diseases | Admitting: Infectious Diseases

## 2024-07-27 ENCOUNTER — Encounter: Payer: Self-pay | Admitting: Infectious Diseases

## 2024-07-27 ENCOUNTER — Telehealth: Payer: Self-pay

## 2024-07-27 VITALS — BP 141/91 | HR 64 | Temp 98.1°F

## 2024-07-27 DIAGNOSIS — B952 Enterococcus as the cause of diseases classified elsewhere: Secondary | ICD-10-CM | POA: Diagnosis not present

## 2024-07-27 DIAGNOSIS — N189 Chronic kidney disease, unspecified: Secondary | ICD-10-CM | POA: Diagnosis not present

## 2024-07-27 DIAGNOSIS — E1142 Type 2 diabetes mellitus with diabetic polyneuropathy: Secondary | ICD-10-CM | POA: Diagnosis not present

## 2024-07-27 DIAGNOSIS — E669 Obesity, unspecified: Secondary | ICD-10-CM | POA: Insufficient documentation

## 2024-07-27 DIAGNOSIS — D631 Anemia in chronic kidney disease: Secondary | ICD-10-CM | POA: Insufficient documentation

## 2024-07-27 DIAGNOSIS — B9561 Methicillin susceptible Staphylococcus aureus infection as the cause of diseases classified elsewhere: Secondary | ICD-10-CM

## 2024-07-27 DIAGNOSIS — E11628 Type 2 diabetes mellitus with other skin complications: Secondary | ICD-10-CM

## 2024-07-27 DIAGNOSIS — N183 Chronic kidney disease, stage 3 unspecified: Secondary | ICD-10-CM | POA: Insufficient documentation

## 2024-07-27 DIAGNOSIS — B9683 Acinetobacter baumannii as the cause of diseases classified elsewhere: Secondary | ICD-10-CM | POA: Insufficient documentation

## 2024-07-27 DIAGNOSIS — E1122 Type 2 diabetes mellitus with diabetic chronic kidney disease: Secondary | ICD-10-CM | POA: Diagnosis not present

## 2024-07-27 DIAGNOSIS — B9689 Other specified bacterial agents as the cause of diseases classified elsewhere: Secondary | ICD-10-CM | POA: Diagnosis not present

## 2024-07-27 DIAGNOSIS — L02612 Cutaneous abscess of left foot: Secondary | ICD-10-CM | POA: Insufficient documentation

## 2024-07-27 DIAGNOSIS — Z794 Long term (current) use of insulin: Secondary | ICD-10-CM

## 2024-07-27 DIAGNOSIS — E11621 Type 2 diabetes mellitus with foot ulcer: Secondary | ICD-10-CM | POA: Insufficient documentation

## 2024-07-27 DIAGNOSIS — L97428 Non-pressure chronic ulcer of left heel and midfoot with other specified severity: Secondary | ICD-10-CM | POA: Insufficient documentation

## 2024-07-27 DIAGNOSIS — Z48817 Encounter for surgical aftercare following surgery on the skin and subcutaneous tissue: Secondary | ICD-10-CM | POA: Insufficient documentation

## 2024-07-27 DIAGNOSIS — E1140 Type 2 diabetes mellitus with diabetic neuropathy, unspecified: Secondary | ICD-10-CM | POA: Insufficient documentation

## 2024-07-27 DIAGNOSIS — I129 Hypertensive chronic kidney disease with stage 1 through stage 4 chronic kidney disease, or unspecified chronic kidney disease: Secondary | ICD-10-CM | POA: Insufficient documentation

## 2024-07-27 NOTE — Progress Notes (Unsigned)
 NAME: Clayton [name]  DOB: [birth year]  MRN: [id number]  Date/Time: 07/27/2024 9:12 AM   Subjective:  Clayton [name] is a 53 y.o.male  with a history of hypertension, diabetes, CKD, obesity   Patient is here as follow-up  for left foot infection Was seen a week ago and I extended his IV meropenem  and daptomycin  for a week Doing very well  He was at [facility] 06/20/24-06/24/24 And underwent I/D of plantar abscess and partial 5th metatarsal base resection and bone biopsy of 4th met Culture was positive for MSSA< enterococcus fecalis and acinetobacter HE was discharged home on IV meropenem  and daptomycin  amputation, Pathology was negative for osteo of both 5th/4th Toes biopsy He is doing much better He is completing 5 weeks of IV antibiotic today and would like to remove PICC line today Has an appt withj Dr.Evans tomorrow HE is using a knee scooter -does not weight bear   Previously he was in [facility] between  05/15/2024 until 05/21/2024 for left foot wound and DKA   underwent debridement on 05/15/2024 and the wound culture was Staphylococcus aureus, Streptococcus mitis and moderate Prevotella  which was beta-lactamase positive.  The blood culture was negative.  The bone pathology from the left cuboid was acute osteomyelitis. As patient did not have insurance he did not want to go on IV antibiotics home.  So he was given long-acting dalbavancin on 05/19/2024 and then 05/26/2024.  He is also on Flagyl  500 mg p.o. twice daily.     Past Medical History:  Diagnosis Date   Asthma    CKD (chronic kidney disease), stage III (HCC)    Hypertension    Type II diabetes mellitus with renal manifestations [facility])     Past Surgical History:  Procedure Laterality Date   APPLICATION OF WOUND VAC Left 05/15/2024   Procedure: APPLICATION, WOUND VAC;  Surgeon: Malvin Marsa FALCON, DPM;  Location: [facility] OR[facility];  Service: Orthopedics/Podiatry;  Laterality: Left;   APPLICATION OF WOUND VAC Left 05/17/2024   Procedure:  APPLICATION, WOUND VAC;  Surgeon: Malvin Marsa FALCON, DPM;  Location: [facility];  Service: Orthopedics/Podiatry;  Laterality: Left;   CATARACT EXTRACTION     GRAFT APPLICATION Left 05/17/2024   Procedure: GRAFT APPLICATION;  Surgeon: Malvin Marsa FALCON, DPM;  Location: [facility];  Service: Orthopedics/Podiatry;  Laterality: Left;   IRRIGATION AND DEBRIDEMENT ABSCESS Left 06/21/2024   Procedure: IRRIGATION AND DEBRIDEMENT ABSCESS;  Surgeon: Malvin Marsa FALCON, DPM;  Location: [facility];  Service: Orthopedics/Podiatry;  Laterality: Left;  Left Foot wound vac applied   IRRIGATION AND DEBRIDEMENT FOOT Left 05/15/2024   Procedure: IRRIGATION AND DEBRIDEMENT FOOT;  Surgeon: Malvin Marsa FALCON, DPM;  Location: [facility];  Service: Orthopedics/Podiatry;  Laterality: Left;  WITH BONE BIOPSY   IRRIGATION AND DEBRIDEMENT FOOT Left 05/17/2024   Procedure: IRRIGATION AND DEBRIDEMENT FOOT;  Surgeon: Malvin Marsa FALCON, DPM;  Location: [facility];  Service: Orthopedics/Podiatry;  Laterality: Left;    Social History   Socioeconomic History   Marital status: Married    Spouse name: Not on file   Number of children: Not on file   Years of education: Not on file   Highest education level: Not on file  Occupational History   Not on file  Tobacco Use   Smoking status: Never   Smokeless tobacco: Never  Substance and Sexual Activity   Alcohol use: Yes   Drug use: Not Currently   Sexual activity: Yes  Other Topics Concern   Not on file  Social History Narrative  Not on file   Social Drivers of Health   Financial Resource Strain: Low Risk  (07/07/2023)   Received from [facility]   Overall Financial Resource Strain (CARDIA)    Difficulty of Paying Living Expenses: Not very hard  Food Insecurity: No Food Insecurity (06/21/2024)   Hunger Vital Sign    Worried About Running Out of Food in the Last Year: Never true    Ran Out of Food in the Last Year: Never true   Transportation Needs: No Transportation Needs (06/21/2024)   PRAPARE - Administrator, Civil Service (Medical): No    Lack of Transportation (Non-Medical): No  Physical Activity: Not on file  Stress: Not on file  Social Connections: Not on file  Intimate Partner Violence: Not At Risk (06/21/2024)   Humiliation, Afraid, Rape, and Kick questionnaire    Fear of Current or Ex-Partner: No    Emotionally Abused: No    Physically Abused: No    Sexually Abused: No    Family History  Problem Relation Age of Onset   Stroke Mother    Hypertension Mother    Hypertension Father    No Known Allergies I? Current Outpatient Medications  Medication Sig Dispense Refill   amLODipine  (NORVASC ) 10 MG tablet Take 1 tablet (10 mg total) by mouth daily. 90 tablet 1   ascorbic acid  (VITAMIN C ) 500 MG tablet Take 1 tablet (500 mg total) by mouth 2 (two) times daily. 90 tablet 1   carvedilol  (COREG ) 25 MG tablet Take 1 tablet (25 mg total) by mouth 2 (two) times daily. 180 tablet 1   Continuous Glucose Sensor (FREESTYLE LIBRE 3 SENSOR) MISC :1 Each Every 2 Weeks     Ensure Max Protein (ENSURE MAX PROTEIN) LIQD Take 330 mLs (11 oz total) by mouth 2 (two) times daily. 237 mL 0   Insulin  Lispro Prot & Lispro (HUMALOG  75/25 MIX) (75-25) 100 UNIT/ML Kwikpen Inject 20 Units into the skin 2 (two) times daily with a meal. 15 mL 11   Insulin  Pen Needle 32G X 4 MM MISC Use as directed with insulin  pens. 100 each 11   losartan  (COZAAR ) 100 MG tablet Take 1 tablet (100 mg total) by mouth daily. 90 tablet 2   Multiple Vitamin (MULTIVITAMIN WITH MINERALS) TABS tablet Take 1 tablet by mouth daily. 30 tablet 3   oxyCODONE -acetaminophen  (PERCOCET/ROXICET) 5-325 MG tablet Take 1-2 tablets by mouth every 6 (six) hours as needed for severe pain (pain score 7-10) or moderate pain (pain score 4-6). 30 tablet 0   No current facility-administered medications for this visit.     Abtx:  Anti-infectives (From admission,  onward)    None       REVIEW OF SYSTEMS:  Const: negative fever, negative chills, negative weight loss Eyes: negative diplopia or visual changes, negative eye pain ENT: negative coryza, negative sore throat Resp: negative cough, hemoptysis, dyspnea Cards: negative for chest pain, palpitations, lower extremity edema GU: negative for frequency, dysuria and hematuria GI: Negative for abdominal pain, diarrhea, bleeding, constipation Skin: negative for rash and pruritus Heme: negative for easy bruising and gum/nose bleeding MS: As above  Neurolo:negative for headaches, dizziness, vertigo, memory problems  Psych: negative for feelings of anxiety, depression  Endocrine:  diabetes Allergy/Immunology- negative for any medication or food allergies ? Objective:  VITALS:  BP (!) 141/91   Pulse 64   Temp 98.1 F (36.7 C) (Temporal)   SpO2 98%    PHYSICAL EXAM:  General:  Alert, cooperative, no distress, appears stated age.  Head: Normocephalic, without obvious abnormality, atraumatic. Eyes: Conjunctivae clear, anicteric sclerae. Pupils are equal ENT Nares normal. No drainage or sinus tenderness. Lips, mucosa, and tongue normal. No Thrush Neck: Supple, symmetrical, no adenopathy, thyroid: non tender no carotid bruit and no JVD. Back: No CVA tenderness. Lungs: Clear to auscultation bilaterally. No Wheezing or Rhonchi. No rales. Heart: Regular rate and rhythm, no murmur, rub or gallop. Abdomen: Did not examine  extremities: Left foot dressing removed The wounds look so much better 07/27/24        07/20/24     07/06/24   07/06/24  Rt picc   Skin: No rashes or lesions. Or bruising Lymph: Cervical, supraclavicular normal. Neurologic: Grossly non-focal Pertinent Labs Lab Results CBC   9/4 cr 2.20 9/9 cr1.78  9/9 wbc 6.3 HB 10.9 PLT 267 CK 74/  07/17/2024 Creatinine 2.52/ CK116 Hemoglobin 11.6  07/25/24 Cr 2.24 CK 132 HB  12.2 ? 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   He  also got  metronidazole  500 mg p.o. twice daily for 2 weeks HE was on  cefadroxil  500 mg p.o. twice daily from Saturday 8/16 While on cefadroxil  he developed a new plantar abscess and was readmitted and underwent I/D and partial resection of 5th met base resection on 06/21/24 Culture was acinetobacter, MSSA, enterococcus fecalis He was disharged  06/24/24 on Meropenem  1 gram IV q 12  and Daptomycin  700mg  IV Q24 for a total of 4 weeks. There was no osteo on the bone sampled- but still decided to treat for 4 weeks ( until 10/3)  because of complicated foot infection with ongoing infection of 2 months .has completed 5 weeks and the wound almost healed PICc removed today- 40in Follow up with Dr.Evans tomorrow  CKD  cr 2.24 Ck 136  Diabetes mellitus  Anemia Last Hb 12.2 ( was 6.2 once before)    ________________________________________________ Discussed with patient in detail Follow-up 1 month Discussed with Dr.Evans

## 2024-07-27 NOTE — Telephone Encounter (Signed)
 Patient's picc line removed in office today by Dr. Fayette. Advised [employer]. Bethel Gaglio ONEIDA Ligas, CMA

## 2024-07-27 NOTE — Patient Instructions (Signed)
 You are here for follow up- completed 5 weeks of antibiotics IV- foot looking great- just a small wound on the bottom- follow up with podiatrist and get his instructions on further care PICc line removed today

## 2024-07-28 ENCOUNTER — Ambulatory Visit

## 2024-07-28 ENCOUNTER — Ambulatory Visit (INDEPENDENT_AMBULATORY_CARE_PROVIDER_SITE_OTHER): Payer: MEDICAID | Admitting: Podiatry

## 2024-07-28 ENCOUNTER — Encounter: Payer: MEDICAID | Admitting: Podiatry

## 2024-07-28 ENCOUNTER — Encounter: Payer: Self-pay | Admitting: Podiatry

## 2024-07-28 VITALS — Ht 72.0 in | Wt 241.0 lb

## 2024-07-28 DIAGNOSIS — L97422 Non-pressure chronic ulcer of left heel and midfoot with fat layer exposed: Secondary | ICD-10-CM

## 2024-07-28 DIAGNOSIS — E11621 Type 2 diabetes mellitus with foot ulcer: Secondary | ICD-10-CM

## 2024-07-28 NOTE — Progress Notes (Signed)
 No chief complaint on file.   Subjective:  Patient presents today status post LT foot irrigation and debridement w/ application of dermal allograft and wound vac application. Inpatient at [facility]. Dr. Marolyn Honour. DOS: 05/17/2024.   Patient was doing well however he developed a recurrent abscess to the plantar aspect of the foot.  Readmitted to the hospital for I&D.  DOS: 06/21/2024.  Dr. Marolyn Honour  Past Medical History:  Diagnosis Date   Asthma    CKD (chronic kidney disease), stage III (HCC)    Hypertension    Type II diabetes mellitus with renal manifestations [facility])     Past Surgical History:  Procedure Laterality Date   APPLICATION OF WOUND VAC Left 05/15/2024   Procedure: APPLICATION, WOUND VAC;  Surgeon: Honour Marsa FALCON, DPM;  Location: [facility];  Service: Orthopedics/Podiatry;  Laterality: Left;   APPLICATION OF WOUND VAC Left 05/17/2024   Procedure: APPLICATION, WOUND VAC;  Surgeon: Honour Marsa FALCON, DPM;  Location: [facility];  Service: Orthopedics/Podiatry;  Laterality: Left;   CATARACT EXTRACTION     GRAFT APPLICATION Left 05/17/2024   Procedure: GRAFT APPLICATION;  Surgeon: Honour Marsa FALCON, DPM;  Location: [facility];  Service: Orthopedics/Podiatry;  Laterality: Left;   IRRIGATION AND DEBRIDEMENT ABSCESS Left 06/21/2024   Procedure: IRRIGATION AND DEBRIDEMENT ABSCESS;  Surgeon: Honour Marsa FALCON, DPM;  Location: [facility];  Service: Orthopedics/Podiatry;  Laterality: Left;  Left Foot wound vac applied   IRRIGATION AND DEBRIDEMENT FOOT Left 05/15/2024   Procedure: IRRIGATION AND DEBRIDEMENT FOOT;  Surgeon: Honour Marsa FALCON, DPM;  Location: [facility];  Service: Orthopedics/Podiatry;  Laterality: Left;  WITH BONE BIOPSY   IRRIGATION AND DEBRIDEMENT FOOT Left 05/17/2024   Procedure: IRRIGATION AND DEBRIDEMENT FOOT;  Surgeon: Honour Marsa FALCON, DPM;  Location: [facility];  Service: Orthopedics/Podiatry;  Laterality: Left;    No  Known Allergies       LT foot 07/28/2024  Objective/Physical Exam Ulcer to the lateral aspect of the left foot appears stable with granular wound base and serosanguineous drainage.  No purulence today.  No malodor.  Open wound also noted to the plantar aspect of the left foot from most recent incision and drainage surgery.  It does extend into the deeper layers of the foot.  Again, no malodor.  Serosanguineous drainage noted.  Please see above noted photo  MR FOOT LEFT W WO CONTRAST 05/15/2024 IMPRESSION: 1. Large open wound on the plantar and lateral aspect of the midfoot hindfoot junction region. 2. Diffuse cellulitis and myofasciitis without definite findings for pyomyositis. 3. Fluid collection wrapping around the bases of the fourth and fifth metatarsals dorsally could be postoperative fluid collection or dissecting abscess. 4. 14 x 13 mm cystic area in the plantar subcutaneous fat slightly more distally. I do not see any surrounding inflammatory changes to suggest this is an abscess but a small abscess is possible. 5. No definite MR findings to suggest osteomyelitis or septic arthritis.  Radiographic exam LT foot 07/28/2024 Mostly unchanged from prior x-rays taken 06/20/2024.  Degenerative changes noted throughout the midtarsal and tarsometatarsal joints.  No gas within the tissue.  Assessment: 1. s/p . LT foot irrigation and debridement w/ application of dermal allograft and wound vac application. Inpatient at [facility]. Dr. Marolyn Honour. DOS: 05/17/2024.  2. Repeat I&D LT foot.  Inpatient at [facility] hospital.  Dr. Marolyn Honour.  DOS: 06/21/2024   Plan of Care:  -Patient was evaluated.  Remaining sutures removed - Foot continues to appear very stable -  PICC line removed.  No longer taking any oral or IV antibiotics per ID.  Seen yesterday, 07/27/2024 -Light debridement of the foot was performed today with a tissue nipper.  Hyperkeratotic skin was debrided. -Okay to wash  the foot and shower.  Soap and water .  Rinse and dry and reapply the Xeroform with dry dressings.  Xeroform provided -Cam boot dispensed.  Okay to begin partial weightbearing in the cam boot with the assistance of crutches.  Slowly increase pressure over 4 weeks -Return to clinic 4 weeks follow-up x-ray  *[employer] Employee. Cleaning services in the hospital    Thresa EMERSON Sar, DPM [facility]  Dr. Thresa EMERSON Sar, DPM    [address] [place], [place] [address]                Office [phone]  Fax [phone]

## 2024-08-15 DIAGNOSIS — T8149XA Infection following a procedure, other surgical site, initial encounter: Secondary | ICD-10-CM | POA: Diagnosis not present

## 2024-08-15 DIAGNOSIS — L97421 Non-pressure chronic ulcer of left heel and midfoot limited to breakdown of skin: Secondary | ICD-10-CM | POA: Diagnosis not present

## 2024-08-15 DIAGNOSIS — L02612 Cutaneous abscess of left foot: Secondary | ICD-10-CM | POA: Diagnosis not present

## 2024-08-15 DIAGNOSIS — E11621 Type 2 diabetes mellitus with foot ulcer: Secondary | ICD-10-CM | POA: Diagnosis not present

## 2024-08-15 DIAGNOSIS — L03032 Cellulitis of left toe: Secondary | ICD-10-CM | POA: Diagnosis not present

## 2024-08-18 DIAGNOSIS — L03032 Cellulitis of left toe: Secondary | ICD-10-CM | POA: Diagnosis not present

## 2024-08-18 DIAGNOSIS — E11621 Type 2 diabetes mellitus with foot ulcer: Secondary | ICD-10-CM | POA: Diagnosis not present

## 2024-08-18 DIAGNOSIS — L97421 Non-pressure chronic ulcer of left heel and midfoot limited to breakdown of skin: Secondary | ICD-10-CM | POA: Diagnosis not present

## 2024-08-18 DIAGNOSIS — T8149XA Infection following a procedure, other surgical site, initial encounter: Secondary | ICD-10-CM | POA: Diagnosis not present

## 2024-08-18 DIAGNOSIS — L02612 Cutaneous abscess of left foot: Secondary | ICD-10-CM | POA: Diagnosis not present

## 2024-08-21 DIAGNOSIS — T8149XA Infection following a procedure, other surgical site, initial encounter: Secondary | ICD-10-CM | POA: Diagnosis not present

## 2024-08-21 DIAGNOSIS — L02612 Cutaneous abscess of left foot: Secondary | ICD-10-CM | POA: Diagnosis not present

## 2024-08-21 DIAGNOSIS — L97421 Non-pressure chronic ulcer of left heel and midfoot limited to breakdown of skin: Secondary | ICD-10-CM | POA: Diagnosis not present

## 2024-08-21 DIAGNOSIS — L03032 Cellulitis of left toe: Secondary | ICD-10-CM | POA: Diagnosis not present

## 2024-08-21 DIAGNOSIS — E11621 Type 2 diabetes mellitus with foot ulcer: Secondary | ICD-10-CM | POA: Diagnosis not present

## 2024-08-23 ENCOUNTER — Other Ambulatory Visit: Payer: Self-pay

## 2024-08-23 DIAGNOSIS — T8131XA Disruption of external operation (surgical) wound, not elsewhere classified, initial encounter: Secondary | ICD-10-CM | POA: Diagnosis not present

## 2024-08-23 DIAGNOSIS — S91302A Unspecified open wound, left foot, initial encounter: Secondary | ICD-10-CM | POA: Diagnosis not present

## 2024-08-24 ENCOUNTER — Encounter: Payer: Self-pay | Admitting: Lab

## 2024-08-24 DIAGNOSIS — L97421 Non-pressure chronic ulcer of left heel and midfoot limited to breakdown of skin: Secondary | ICD-10-CM | POA: Diagnosis not present

## 2024-08-24 DIAGNOSIS — E11621 Type 2 diabetes mellitus with foot ulcer: Secondary | ICD-10-CM | POA: Diagnosis not present

## 2024-08-24 DIAGNOSIS — L02612 Cutaneous abscess of left foot: Secondary | ICD-10-CM | POA: Diagnosis not present

## 2024-08-24 DIAGNOSIS — T8149XA Infection following a procedure, other surgical site, initial encounter: Secondary | ICD-10-CM | POA: Diagnosis not present

## 2024-08-24 DIAGNOSIS — L03032 Cellulitis of left toe: Secondary | ICD-10-CM | POA: Diagnosis not present

## 2024-08-25 ENCOUNTER — Ambulatory Visit

## 2024-08-25 ENCOUNTER — Encounter: Payer: Self-pay | Admitting: Podiatry

## 2024-08-25 ENCOUNTER — Ambulatory Visit (INDEPENDENT_AMBULATORY_CARE_PROVIDER_SITE_OTHER): Admitting: Podiatry

## 2024-08-25 VITALS — Ht 72.0 in | Wt 241.0 lb

## 2024-08-25 DIAGNOSIS — L97422 Non-pressure chronic ulcer of left heel and midfoot with fat layer exposed: Secondary | ICD-10-CM | POA: Diagnosis not present

## 2024-08-25 DIAGNOSIS — E11621 Type 2 diabetes mellitus with foot ulcer: Secondary | ICD-10-CM

## 2024-08-25 NOTE — Progress Notes (Signed)
 No chief complaint on file.   Subjective:  Patient presents today status post LT foot irrigation and debridement w/ application of dermal allograft and wound vac application. Inpatient at [facility]. Dr. Marolyn Honour. DOS: 05/17/2024.   Patient was doing well however he developed a recurrent abscess to the plantar aspect of the foot.  Readmitted to the hospital for I&D.  DOS: 06/21/2024.  Dr. Marolyn Honour  Past Medical History:  Diagnosis Date   Asthma    CKD (chronic kidney disease), stage III (HCC)    Hypertension    Type II diabetes mellitus with renal manifestations [facility])     Past Surgical History:  Procedure Laterality Date   APPLICATION OF WOUND VAC Left 05/15/2024   Procedure: APPLICATION, WOUND VAC;  Surgeon: Honour Marsa FALCON, Clayton [name];  Location: [facility];  Service: Orthopedics/Podiatry;  Laterality: Left;   APPLICATION OF WOUND VAC Left 05/17/2024   Procedure: APPLICATION, WOUND VAC;  Surgeon: Honour Marsa FALCON, Clayton [name];  Location: [facility];  Service: Orthopedics/Podiatry;  Laterality: Left;   CATARACT EXTRACTION     GRAFT APPLICATION Left 05/17/2024   Procedure: GRAFT APPLICATION;  Surgeon: Honour Marsa FALCON, Clayton [name];  Location: [facility];  Service: Orthopedics/Podiatry;  Laterality: Left;   IRRIGATION AND DEBRIDEMENT ABSCESS Left 06/21/2024   Procedure: IRRIGATION AND DEBRIDEMENT ABSCESS;  Surgeon: Honour Marsa FALCON, Clayton [name];  Location: [facility];  Service: Orthopedics/Podiatry;  Laterality: Left;  Left Foot wound vac applied   IRRIGATION AND DEBRIDEMENT FOOT Left 05/15/2024   Procedure: IRRIGATION AND DEBRIDEMENT FOOT;  Surgeon: Honour Marsa FALCON, Clayton [name];  Location: [facility];  Service: Orthopedics/Podiatry;  Laterality: Left;  WITH BONE BIOPSY   IRRIGATION AND DEBRIDEMENT FOOT Left 05/17/2024   Procedure: IRRIGATION AND DEBRIDEMENT FOOT;  Surgeon: Honour Marsa FALCON, Clayton [name];  Location: [facility];  Service: Orthopedics/Podiatry;  Laterality: Left;    No  Known Allergies       LT foot 07/28/2024   LT foot 08/25/2024  LT foot 08/25/2024  Objective/Physical Exam Steady improvement.  Ulcer to the lateral aspect of the left foot appears healthy and granular.  No appreciable drainage.  No malodor.  Clinically no concern for acute infection or deeper underlying cellulitis  The wound to the plantar aspect of the left foot also appears stable with good improvement.  After debridement it does extend into the subcutaneous tissue however it is very stable with no appreciable drainage  MR FOOT LEFT W WO CONTRAST 05/15/2024 IMPRESSION: 1. Large open wound on the plantar and lateral aspect of the midfoot hindfoot junction region. 2. Diffuse cellulitis and myofasciitis without definite findings for pyomyositis. 3. Fluid collection wrapping around the bases of the fourth and fifth metatarsals dorsally could be postoperative fluid collection or dissecting abscess. 4. 14 x 13 mm cystic area in the plantar subcutaneous fat slightly more distally. I do not see any surrounding inflammatory changes to suggest this is an abscess but a small abscess is possible. 5. No definite MR findings to suggest osteomyelitis or septic arthritis.  Radiographic exam LT foot 08/25/2024 Stable with actual improvement noted from prior x-rays.  No acute erosions concerning for recalcitrant osteomyelitis at the moment.  Degenerative changes noted throughout the midtarsal and tarsometatarsal joints.  No gas within the tissue.  Assessment: 1. s/p . LT foot irrigation and debridement w/ application of dermal allograft and wound vac application. Inpatient at [facility]. Dr. Marolyn Honour. DOS: 05/17/2024.  2. Repeat I&D LT foot.  Inpatient at [facility].  Dr. Marolyn Honour.  DOS:  06/21/2024   Plan of Care:  -Patient was evaluated.   -H/o PICC line antibiotics.  No longer taking any oral or IV antibiotics per ID.  Last seen by ID 07/27/2024 -Light debridement of the  foot was performed today with a tissue nipper.  Hyperkeratotic skin was debrided. - Continue to wash the foot and shower.  Soap and water .  Rinse and dry and reapply the Xeroform with dry dressings.  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 Thresa EMERSON [name], Clayton [name]    [address] [place], [place] [address]                Office [phone]  Fax [phone]

## 2024-08-28 ENCOUNTER — Ambulatory Visit: Admitting: Podiatry

## 2024-08-28 DIAGNOSIS — M79675 Pain in left toe(s): Secondary | ICD-10-CM

## 2024-08-28 DIAGNOSIS — M79674 Pain in right toe(s): Secondary | ICD-10-CM

## 2024-08-28 DIAGNOSIS — Z0189 Encounter for other specified special examinations: Secondary | ICD-10-CM | POA: Diagnosis not present

## 2024-08-28 DIAGNOSIS — E119 Type 2 diabetes mellitus without complications: Secondary | ICD-10-CM | POA: Diagnosis not present

## 2024-08-28 DIAGNOSIS — E11621 Type 2 diabetes mellitus with foot ulcer: Secondary | ICD-10-CM

## 2024-08-28 DIAGNOSIS — E1142 Type 2 diabetes mellitus with diabetic polyneuropathy: Secondary | ICD-10-CM | POA: Diagnosis not present

## 2024-08-28 DIAGNOSIS — L97422 Non-pressure chronic ulcer of left heel and midfoot with fat layer exposed: Secondary | ICD-10-CM | POA: Diagnosis not present

## 2024-08-28 DIAGNOSIS — B351 Tinea unguium: Secondary | ICD-10-CM

## 2024-08-29 DIAGNOSIS — T8149XA Infection following a procedure, other surgical site, initial encounter: Secondary | ICD-10-CM | POA: Diagnosis not present

## 2024-08-29 DIAGNOSIS — L97421 Non-pressure chronic ulcer of left heel and midfoot limited to breakdown of skin: Secondary | ICD-10-CM | POA: Diagnosis not present

## 2024-08-29 DIAGNOSIS — L03032 Cellulitis of left toe: Secondary | ICD-10-CM | POA: Diagnosis not present

## 2024-08-29 DIAGNOSIS — E11621 Type 2 diabetes mellitus with foot ulcer: Secondary | ICD-10-CM | POA: Diagnosis not present

## 2024-08-29 DIAGNOSIS — L97529 Non-pressure chronic ulcer of other part of left foot with unspecified severity: Secondary | ICD-10-CM | POA: Diagnosis not present

## 2024-08-29 DIAGNOSIS — L02612 Cutaneous abscess of left foot: Secondary | ICD-10-CM | POA: Diagnosis not present

## 2024-08-31 ENCOUNTER — Ambulatory Visit: Payer: MEDICAID | Attending: Infectious Diseases | Admitting: Infectious Diseases

## 2024-08-31 ENCOUNTER — Encounter: Payer: Self-pay | Admitting: Infectious Diseases

## 2024-08-31 VITALS — BP 132/78 | HR 67 | Temp 98.4°F

## 2024-08-31 DIAGNOSIS — E119 Type 2 diabetes mellitus without complications: Secondary | ICD-10-CM

## 2024-08-31 DIAGNOSIS — N183 Chronic kidney disease, stage 3 unspecified: Secondary | ICD-10-CM | POA: Insufficient documentation

## 2024-08-31 DIAGNOSIS — Z794 Long term (current) use of insulin: Secondary | ICD-10-CM

## 2024-08-31 DIAGNOSIS — E11628 Type 2 diabetes mellitus with other skin complications: Secondary | ICD-10-CM

## 2024-08-31 DIAGNOSIS — Z5971 Insufficient health insurance coverage: Secondary | ICD-10-CM | POA: Insufficient documentation

## 2024-08-31 DIAGNOSIS — E669 Obesity, unspecified: Secondary | ICD-10-CM | POA: Insufficient documentation

## 2024-08-31 DIAGNOSIS — E1142 Type 2 diabetes mellitus with diabetic polyneuropathy: Secondary | ICD-10-CM | POA: Insufficient documentation

## 2024-08-31 DIAGNOSIS — L089 Local infection of the skin and subcutaneous tissue, unspecified: Secondary | ICD-10-CM | POA: Insufficient documentation

## 2024-08-31 DIAGNOSIS — Z8619 Personal history of other infectious and parasitic diseases: Secondary | ICD-10-CM | POA: Diagnosis not present

## 2024-08-31 DIAGNOSIS — B9561 Methicillin susceptible Staphylococcus aureus infection as the cause of diseases classified elsewhere: Secondary | ICD-10-CM | POA: Insufficient documentation

## 2024-08-31 DIAGNOSIS — I129 Hypertensive chronic kidney disease with stage 1 through stage 4 chronic kidney disease, or unspecified chronic kidney disease: Secondary | ICD-10-CM | POA: Insufficient documentation

## 2024-08-31 DIAGNOSIS — E1122 Type 2 diabetes mellitus with diabetic chronic kidney disease: Secondary | ICD-10-CM | POA: Insufficient documentation

## 2024-08-31 DIAGNOSIS — D631 Anemia in chronic kidney disease: Secondary | ICD-10-CM | POA: Insufficient documentation

## 2024-08-31 DIAGNOSIS — M14679 Charcot's joint, unspecified ankle and foot: Secondary | ICD-10-CM | POA: Insufficient documentation

## 2024-08-31 NOTE — Progress Notes (Signed)
 NAME: Clayton [name]  DOB: [birth year]  MRN: [id number]  Date/Time: 08/31/2024 8:54 AM   Subjective:  Clayton [name] is a 53 y.o.male  with a history of hypertension, diabetes, CKD, obesity   Patient is here as follow-up  for left foot infection Has completed 6 weeks of IV and off antibiotics since 07/27/24 Was Doing very well Now has a small crack/superficial ulcer on the lateral margin due to show rubbing Saw Dr.Evans on 08/25/24 For the past month wearing special boot  He was at [facility] 06/20/24-06/24/24 And underwent I/D of plantar abscess and partial 5th metatarsal base resection and bone biopsy of 4th met Culture was positive for MSSA< enterococcus fecalis and acinetobacter HE was discharged home on IV meropenem  and daptomycin  amputation, Pathology was negative for osteo of both 5th/4th Toes biopsy and completed 40 days of dapto and meropenem  Previously he was in [facility] between  05/15/2024 until 05/21/2024 for left foot wound and DKA   underwent debridement on 05/15/2024 and the wound culture was Staphylococcus aureus, Streptococcus mitis and moderate Prevotella  which was beta-lactamase positive.  The blood culture was negative.  The bone pathology from the left cuboid was acute osteomyelitis. As patient did not have insurance he did not want to go on IV antibiotics home.  So he was given long-acting dalbavancin on 05/19/2024 and then 05/26/2024.  He is also on Flagyl  500 mg p.o. twice daily.     Past Medical History:  Diagnosis Date   Asthma    CKD (chronic kidney disease), stage III (HCC)    Hypertension    Type II diabetes mellitus with renal manifestations [facility])     Past Surgical History:  Procedure Laterality Date   APPLICATION OF WOUND VAC Left 05/15/2024   Procedure: APPLICATION, WOUND VAC;  Surgeon: Malvin Marsa FALCON, DPM;  Location: [facility] ORS;  Service: Orthopedics/Podiatry;  Laterality: Left;   APPLICATION OF WOUND VAC Left 05/17/2024   Procedure: APPLICATION, WOUND VAC;   Surgeon: Malvin Marsa FALCON, DPM;  Location: [facility] ORS;  Service: Orthopedics/Podiatry;  Laterality: Left;   CATARACT EXTRACTION     GRAFT APPLICATION Left 05/17/2024   Procedure: GRAFT APPLICATION;  Surgeon: Malvin Marsa FALCON, DPM;  Location: [facility] ORS;  Service: Orthopedics/Podiatry;  Laterality: Left;   IRRIGATION AND DEBRIDEMENT ABSCESS Left 06/21/2024   Procedure: IRRIGATION AND DEBRIDEMENT ABSCESS;  Surgeon: Malvin Marsa FALCON, DPM;  Location: [facility] ORS;  Service: Orthopedics/Podiatry;  Laterality: Left;  Left Foot wound vac applied   IRRIGATION AND DEBRIDEMENT FOOT Left 05/15/2024   Procedure: IRRIGATION AND DEBRIDEMENT FOOT;  Surgeon: Malvin Marsa FALCON, DPM;  Location: [facility] ORS;  Service: Orthopedics/Podiatry;  Laterality: Left;  WITH BONE BIOPSY   IRRIGATION AND DEBRIDEMENT FOOT Left 05/17/2024   Procedure: IRRIGATION AND DEBRIDEMENT FOOT;  Surgeon: Malvin Marsa FALCON, DPM;  Location: [facility] ORS;  Service: Orthopedics/Podiatry;  Laterality: Left;    Social History   Socioeconomic History   Marital status: Married    Spouse name: Not on file   Number of children: Not on file   Years of education: Not on file   Highest education level: Not on file  Occupational History   Not on file  Tobacco Use   Smoking status: Never   Smokeless tobacco: Never  Substance and Sexual Activity   Alcohol use: Yes   Drug use: Not Currently   Sexual activity: Yes  Other Topics Concern   Not on file  Social History Narrative   Not on file   Social Drivers of Health  Financial Resource Strain: Low Risk  (07/07/2023)   Received from [facility]   Overall Financial Resource Strain (CARDIA)    Difficulty of Paying Living Expenses: Not very hard  Food Insecurity: No Food Insecurity (06/21/2024)   Hunger Vital Sign    Worried About Running Out of Food in the Last Year: Never true    Ran Out of Food in the Last Year: Never true  Transportation Needs: No  Transportation Needs (06/21/2024)   PRAPARE - Administrator, Civil Service (Medical): No    Lack of Transportation (Non-Medical): No  Physical Activity: Not on file  Stress: Not on file  Social Connections: Not on file  Intimate Partner Violence: Not At Risk (06/21/2024)   Humiliation, Afraid, Rape, and Kick questionnaire    Fear of Current or Ex-Partner: No    Emotionally Abused: No    Physically Abused: No    Sexually Abused: No    Family History  Problem Relation Age of Onset   Stroke Mother    Hypertension Mother    Hypertension Father    No Known Allergies I? Current Outpatient Medications  Medication Sig Dispense Refill   oxyCODONE -acetaminophen  (PERCOCET/ROXICET) 5-325 MG tablet Take 1-2 tablets by mouth every 6 (six) hours as needed for severe pain (pain score 7-10) or moderate pain (pain score 4-6). 30 tablet 0   amLODipine  (NORVASC ) 10 MG tablet Take 1 tablet (10 mg total) by mouth daily. 90 tablet 1   ascorbic acid  (VITAMIN C ) 500 MG tablet Take 1 tablet (500 mg total) by mouth 2 (two) times daily. 90 tablet 1   carvedilol  (COREG ) 25 MG tablet Take 1 tablet (25 mg total) by mouth 2 (two) times daily. 180 tablet 1   Continuous Glucose Sensor (FREESTYLE LIBRE 3 SENSOR) MISC :1 Each Every 2 Weeks     Ensure Max Protein (ENSURE MAX PROTEIN) LIQD Take 330 mLs (11 oz total) by mouth 2 (two) times daily. 237 mL 0   Insulin  Lispro Prot & Lispro (HUMALOG  75/25 MIX) (75-25) 100 UNIT/ML Kwikpen Inject 20 Units into the skin 2 (two) times daily with a meal. 15 mL 11   Insulin  Pen Needle 32G X 4 MM MISC Use as directed with insulin  pens. 100 each 11   losartan  (COZAAR ) 100 MG tablet Take 1 tablet (100 mg total) by mouth daily. 90 tablet 2   Multiple Vitamin (MULTIVITAMIN WITH MINERALS) TABS tablet Take 1 tablet by mouth daily. 30 tablet 3   No current facility-administered medications for this visit.     Abtx:  Anti-infectives (From admission, onward)    None        REVIEW OF SYSTEMS:  Const: negative fever, negative chills, negative weight loss Eyes: negative diplopia or visual changes, negative eye pain ENT: negative coryza, negative sore throat Resp: negative cough, hemoptysis, dyspnea Cards: negative for chest pain, palpitations, lower extremity edema GU: negative for frequency, dysuria and hematuria GI: Negative for abdominal pain, diarrhea, bleeding, constipation Skin: negative for rash and pruritus Heme: negative for easy bruising and gum/nose bleeding MS: As above  Neurolo:negative for headaches, dizziness, vertigo, memory problems  Psych: negative for feelings of anxiety, depression  Endocrine:  diabetes Allergy/Immunology- negative for any medication or food allergies ? Objective:  VITALS:  BP 132/78   Pulse 67   Temp 98.4 F (36.9 C) (Temporal)   SpO2 95%    PHYSICAL EXAM:  General: Alert, cooperative, no distress, appears stated age.  Head: Normocephalic, without obvious  abnormality, atraumatic. Eyes: Conjunctivae clear, anicteric sclerae. Pupils are equal ENT Nares normal. No drainage or sinus tenderness. Lips, mucosa, and tongue normal. No Thrush Neck: Supple, symmetrical, no adenopathy, thyroid: non tender no carotid bruit and no JVD. Back: No CVA tenderness. Lungs: Clear to auscultation bilaterally. No Wheezing or Rhonchi. No rales. Heart: Regular rate and rhythm, no murmur, rub or gallop. Abdomen: Did not examine  extremities: Left foot dressing removed The wound on the plantar surface healed completely The lateral margin has a superficial break in skin-   08/30/24     07/27/24        07/20/24     07/06/24   07/06/24  Rt picc   Skin: No rashes or lesions. Or bruising Lymph: Cervical, supraclavicular normal. Neurologic: Grossly non-focal Pertinent Labs Lab Results CBC   9/4 cr 2.20 9/9 cr1.78  9/9 wbc 6.3 HB 10.9 PLT 267 CK 74/  07/17/2024 Creatinine 2.52/ CK116 Hemoglobin  11.6  07/25/24 Cr 2.24 CK 132 HB 12.2 ? 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   He  also got  metronidazole  500 mg p.o. twice daily for 2 weeks HE was on  cefadroxil  500 mg p.o. twice daily from Saturday 8/16 While on cefadroxil  he developed a new plantar abscess and was readmitted and underwent I/D and partial resection of 5th met base resection on 06/21/24 Culture was acinetobacter, MSSA, enterococcus fecalis He was disharged  06/24/24 on Meropenem  1 gram IV q 12  and Daptomycin  700mg  IV Q24 for a total of 4 weeks. There was no osteo on the bone sampled- because it was a complicated wound we extended the antibiotics for 40 days and he completed on 10/9 /25 and PICc was removed He has been off antiubiotics for a month and is doing very well- the entire foot had healed completely but he now has a lateral superficial wound from the shoe rubbing- not infected- followed by Dr.Evans  CKD    Diabetes mellitus says he is well controlled  Anemia Last Hb 12.2 ( was 6.2 once before)    ________________________________________________ Discussed with patient in detail Pt discharged from my clinic-  Follow up with Dr.Evans

## 2024-09-01 DIAGNOSIS — L02612 Cutaneous abscess of left foot: Secondary | ICD-10-CM | POA: Diagnosis not present

## 2024-09-01 DIAGNOSIS — E11621 Type 2 diabetes mellitus with foot ulcer: Secondary | ICD-10-CM | POA: Diagnosis not present

## 2024-09-01 DIAGNOSIS — L97421 Non-pressure chronic ulcer of left heel and midfoot limited to breakdown of skin: Secondary | ICD-10-CM | POA: Diagnosis not present

## 2024-09-01 DIAGNOSIS — L03032 Cellulitis of left toe: Secondary | ICD-10-CM | POA: Diagnosis not present

## 2024-09-01 DIAGNOSIS — T8149XA Infection following a procedure, other surgical site, initial encounter: Secondary | ICD-10-CM | POA: Diagnosis not present

## 2024-09-03 ENCOUNTER — Encounter: Payer: Self-pay | Admitting: Podiatry

## 2024-09-03 NOTE — Progress Notes (Signed)
  Subjective:  Patient ID: Clayton [name], male    DOB: [birth year],  MRN: [id number]  Clayton [name] presents to clinic today for at risk foot care with history of diabetic neuropathy and painful thick toenails that are difficult to trim. Pain interferes with ambulation. Aggravating factors include wearing enclosed shoe gear. Pain is relieved with periodic professional debridement.  Patient has h/o diabetic foot ulcer and is under treatment of Dr. Janit. He is receiving dressing changes by [facility] and is scheduled to have dressing changed today. Chief Complaint  Patient presents with   Toe Pain    DFC. Dr. Arden is his PCP last visit was in Sept. A1c 6.4. Dr. Janit is treating the left foot ulcer     PCP is Clayton [name], Clayton Lot, MD.  No Known Allergies  Review of Systems: Negative except as noted in the HPI.  Objective: No changes noted in today's physical examination. There were no vitals filed for this visit. Clayton [name] is a pleasant 53 y.o. male WD, WN in NAD. AAO x 3.  Vascular Examination: Capillary refill time immediate b/l. Palpable pedal pulses. Pedal hair present b/l. Pedal edema trace left foot. No pain with calf compression b/l. Skin temperature gradient WNL b/l. No cyanosis or clubbing b/l. No ischemia or gangrene noted b/l.   Neurological Examination: Sensation grossly intact b/l with 10 gram monofilament. Vibratory sensation intact b/l. Pt has subjective symptoms of neuropathy.  Dermatological Examination: Dressing left foot with sanguinous drainage strikethough. No interdigital macerations.   Toenails 1-5 right, 1, 2,3 left foot thick, discolored, elongated with subungual debris and pain on dorsal palpation.   No hyperkeratotic nor porokeratotic lesions.  Musculoskeletal Examination: Muscle strength 5/5 to all LE muscle groups of right lower extremity. Wearing BK walking boot LLE.  Radiographs: None  Last A1c:      Latest Ref Rng & Units 06/21/2024     7:12 PM 05/15/2024   11:55 AM 09/27/2023   11:18 PM  Hemoglobin A1C  Hemoglobin-A1c 4.8 - 5.6 % 6.4  11.1  6.9    Assessment/Plan: 1. Pain due to onychomycosis of toenails of both feet   2. Diabetic ulcer of left midfoot associated with type 2 diabetes mellitus, with fat layer exposed (HCC)   3. Diabetic peripheral neuropathy associated with type 2 diabetes mellitus (HCC)   4. Encounter for diabetic foot exam (HCC)     Diabetic foot examination performed today. All patient's and/or POA's questions/concerns addressed on today's visit. Toenails 1-5 right foot, L hallux, L 2nd toe, and L 3rd toe debrided in length and girth without incident. Monitor blood glucose per PCP/Endocrinologist's recommendations.Continue soft, supportive shoe gear daily. Report any pedal injuries to medical professional. Call office if there are any questions/concerns. -Patient/POA to call should there be question/concern in the interim.   Return in about 3 months (around 11/28/2024).  Clayton [name], DPM      [facility] LOCATION: [address], [place] [address]                   Office [phone]   [facility] LOCATION: [address] [place], [place] [address] Office [phone]

## 2024-09-04 ENCOUNTER — Encounter: Payer: Self-pay | Admitting: Podiatry

## 2024-09-04 DIAGNOSIS — L97421 Non-pressure chronic ulcer of left heel and midfoot limited to breakdown of skin: Secondary | ICD-10-CM | POA: Diagnosis not present

## 2024-09-04 DIAGNOSIS — T8149XA Infection following a procedure, other surgical site, initial encounter: Secondary | ICD-10-CM | POA: Diagnosis not present

## 2024-09-04 DIAGNOSIS — L03032 Cellulitis of left toe: Secondary | ICD-10-CM | POA: Diagnosis not present

## 2024-09-04 DIAGNOSIS — E11621 Type 2 diabetes mellitus with foot ulcer: Secondary | ICD-10-CM | POA: Diagnosis not present

## 2024-09-04 DIAGNOSIS — L02612 Cutaneous abscess of left foot: Secondary | ICD-10-CM | POA: Diagnosis not present

## 2024-09-07 DIAGNOSIS — L03032 Cellulitis of left toe: Secondary | ICD-10-CM | POA: Diagnosis not present

## 2024-09-07 DIAGNOSIS — E11621 Type 2 diabetes mellitus with foot ulcer: Secondary | ICD-10-CM | POA: Diagnosis not present

## 2024-09-07 DIAGNOSIS — L97421 Non-pressure chronic ulcer of left heel and midfoot limited to breakdown of skin: Secondary | ICD-10-CM | POA: Diagnosis not present

## 2024-09-07 DIAGNOSIS — L02612 Cutaneous abscess of left foot: Secondary | ICD-10-CM | POA: Diagnosis not present

## 2024-09-07 DIAGNOSIS — T8149XA Infection following a procedure, other surgical site, initial encounter: Secondary | ICD-10-CM | POA: Diagnosis not present

## 2024-09-12 ENCOUNTER — Ambulatory Visit (INDEPENDENT_AMBULATORY_CARE_PROVIDER_SITE_OTHER): Admitting: Podiatry

## 2024-09-12 ENCOUNTER — Telehealth: Payer: Self-pay | Admitting: Podiatry

## 2024-09-12 ENCOUNTER — Encounter: Payer: Self-pay | Admitting: Podiatry

## 2024-09-12 VITALS — Ht 72.0 in | Wt 241.0 lb

## 2024-09-12 DIAGNOSIS — E08621 Diabetes mellitus due to underlying condition with foot ulcer: Secondary | ICD-10-CM

## 2024-09-12 DIAGNOSIS — L97422 Non-pressure chronic ulcer of left heel and midfoot with fat layer exposed: Secondary | ICD-10-CM | POA: Diagnosis not present

## 2024-09-12 NOTE — Telephone Encounter (Signed)
 s/w pt and he will pay 25 at [facility] office and leave forms. His stay out is extended until approx 11/19/24. I adv would fax back forms once completed for him

## 2024-09-12 NOTE — Progress Notes (Signed)
 No chief complaint on file.   Subjective:  Patient presents today status post LT foot irrigation and debridement w/ application of dermal allograft and wound vac application. Inpatient at [facility]. Dr. Marolyn Honour. DOS: 05/17/2024. Patient was doing well however he developed a recurrent abscess to the plantar aspect of the foot.  Readmitted to the hospital for I&D.  DOS: 06/21/2024.  Dr. Marolyn Honour  Patient states that he is doing well today with significant reduction of the swelling.  Says the foot feels well however he continues to have the persistent ulcer to the lateral aspect of the left foot.  He says that he has been noticing some scant drainage and he has also been increasing his activity and walking on the foot in the boot  Past Medical History:  Diagnosis Date   Asthma    CKD (chronic kidney disease), stage III (HCC)    Hypertension    Type II diabetes mellitus with renal manifestations [facility])     Past Surgical History:  Procedure Laterality Date   APPLICATION OF WOUND VAC Left 05/15/2024   Procedure: APPLICATION, WOUND VAC;  Surgeon: Honour Marsa FALCON, DPM;  Location: [facility] ORS;  Service: Orthopedics/Podiatry;  Laterality: Left;   APPLICATION OF WOUND VAC Left 05/17/2024   Procedure: APPLICATION, WOUND VAC;  Surgeon: Honour Marsa FALCON, DPM;  Location: [facility] ORS;  Service: Orthopedics/Podiatry;  Laterality: Left;   CATARACT EXTRACTION     GRAFT APPLICATION Left 05/17/2024   Procedure: GRAFT APPLICATION;  Surgeon: Honour Marsa FALCON, DPM;  Location: [facility] ORS;  Service: Orthopedics/Podiatry;  Laterality: Left;   IRRIGATION AND DEBRIDEMENT ABSCESS Left 06/21/2024   Procedure: IRRIGATION AND DEBRIDEMENT ABSCESS;  Surgeon: Honour Marsa FALCON, DPM;  Location: [facility] ORS;  Service: Orthopedics/Podiatry;  Laterality: Left;  Left Foot wound vac applied   IRRIGATION AND DEBRIDEMENT FOOT Left 05/15/2024   Procedure: IRRIGATION AND DEBRIDEMENT FOOT;  Surgeon:  Honour Marsa FALCON, DPM;  Location: [facility] ORS;  Service: Orthopedics/Podiatry;  Laterality: Left;  WITH BONE BIOPSY   IRRIGATION AND DEBRIDEMENT FOOT Left 05/17/2024   Procedure: IRRIGATION AND DEBRIDEMENT FOOT;  Surgeon: Honour Marsa FALCON, DPM;  Location: [facility] ORS;  Service: Orthopedics/Podiatry;  Laterality: Left;    No Known Allergies       LT foot 07/28/2024   LT foot 08/25/2024  LT foot 08/25/2024   LT foot 09/12/2024  Objective/Physical Exam Ulcer noted to the plantar lateral aspect of the left foot measuring approximately 3.0 x 3.0 x 0.2 cm.  To noted ulceration there is no eschar.  Granular base with periwound callus noted.  No significant drainage.  It appears very dry and stable.  No exposed bone muscle tendon ligament or joint. The wound to the plantar aspect of the left foot has healed and resolved completely.  MR FOOT LEFT W WO CONTRAST 05/15/2024 IMPRESSION: 1. Large open wound on the plantar and lateral aspect of the midfoot hindfoot junction region. 2. Diffuse cellulitis and myofasciitis without definite findings for pyomyositis. 3. Fluid collection wrapping around the bases of the fourth and fifth metatarsals dorsally could be postoperative fluid collection or dissecting abscess. 4. 14 x 13 mm cystic area in the plantar subcutaneous fat slightly more distally. I do not see any surrounding inflammatory changes to suggest this is an abscess but a small abscess is possible. 5. No definite MR findings to suggest osteomyelitis or septic arthritis.  Radiographic exam LT foot 08/25/2024 Stable with actual improvement noted from prior x-rays.  No acute erosions concerning for  recalcitrant osteomyelitis at the moment.  Degenerative changes noted throughout the midtarsal and tarsometatarsal joints.  No gas within the tissue.  Assessment: 1. s/p . LT foot irrigation and debridement w/ application of dermal allograft and wound vac application. Inpatient at [facility]. Dr. Marolyn Honour. DOS: 05/17/2024.  2. Repeat I&D LT foot.  Inpatient at [facility] hospital.  Dr. Marolyn Honour.  DOS: 06/21/2024   Plan of Care:  -Patient was evaluated.   -H/o PICC line antibiotics.  No longer taking any oral or IV antibiotics per ID.  Last seen by ID 07/27/2024 - Medically necessary excisional debridement including subcutaneous tissue was performed today using a tissue nipper.  Excisional debridement of the necrotic nonviable tissue down to healthier bleeding viable tissue was performed with postdebridement measurement same as pre- - Continue to wash the foot and shower.  Soap and water .  Rinse and dry and reapply the Xeroform with dry dressings.  Xeroform provided - Unfortunately with full weightbearing the wound has progressed and slightly enlarged.  Resume nonweightbearing using a knee scooter -Referral also placed today for the wound care center at [facility] wound care center.  Greatly appreciated -Return to clinic approximately 8 weeks after he has been treated and managed by the wound care center to see if they can assist with closure of the wound to the lateral aspect of the foot.    *[facility] [employer] [facility] [employer]. Cleaning services in the hospital    Thresa EMERSON Sar, DPM [facility]  Dr. Thresa EMERSON Sar, DPM    [address] [place], [place] [address]                Office [phone]  Fax [phone]

## 2024-09-15 DIAGNOSIS — L02612 Cutaneous abscess of left foot: Secondary | ICD-10-CM | POA: Diagnosis not present

## 2024-09-15 DIAGNOSIS — T8149XA Infection following a procedure, other surgical site, initial encounter: Secondary | ICD-10-CM | POA: Diagnosis not present

## 2024-09-15 DIAGNOSIS — L97421 Non-pressure chronic ulcer of left heel and midfoot limited to breakdown of skin: Secondary | ICD-10-CM | POA: Diagnosis not present

## 2024-09-15 DIAGNOSIS — L03032 Cellulitis of left toe: Secondary | ICD-10-CM | POA: Diagnosis not present

## 2024-09-15 DIAGNOSIS — E11621 Type 2 diabetes mellitus with foot ulcer: Secondary | ICD-10-CM | POA: Diagnosis not present

## 2024-09-18 DIAGNOSIS — Z0279 Encounter for issue of other medical certificate: Secondary | ICD-10-CM

## 2024-09-19 ENCOUNTER — Telehealth: Payer: Self-pay | Admitting: Podiatry

## 2024-09-19 NOTE — Telephone Encounter (Signed)
 Faxed [employer] [phone] RTW 11/19/24 approx

## 2024-09-20 DIAGNOSIS — E1122 Type 2 diabetes mellitus with diabetic chronic kidney disease: Secondary | ICD-10-CM | POA: Diagnosis not present

## 2024-09-20 DIAGNOSIS — E7800 Pure hypercholesterolemia, unspecified: Secondary | ICD-10-CM | POA: Diagnosis not present

## 2024-09-20 DIAGNOSIS — Z794 Long term (current) use of insulin: Secondary | ICD-10-CM | POA: Diagnosis not present

## 2024-09-20 DIAGNOSIS — Z125 Encounter for screening for malignant neoplasm of prostate: Secondary | ICD-10-CM | POA: Diagnosis not present

## 2024-09-20 DIAGNOSIS — E1129 Type 2 diabetes mellitus with other diabetic kidney complication: Secondary | ICD-10-CM | POA: Diagnosis not present

## 2024-09-20 DIAGNOSIS — N1832 Chronic kidney disease, stage 3b: Secondary | ICD-10-CM | POA: Diagnosis not present

## 2024-09-20 DIAGNOSIS — E11621 Type 2 diabetes mellitus with foot ulcer: Secondary | ICD-10-CM | POA: Diagnosis not present

## 2024-09-20 DIAGNOSIS — I10 Essential (primary) hypertension: Secondary | ICD-10-CM | POA: Diagnosis not present

## 2024-09-20 DIAGNOSIS — Z2821 Immunization not carried out because of patient refusal: Secondary | ICD-10-CM | POA: Diagnosis not present

## 2024-09-20 DIAGNOSIS — L97529 Non-pressure chronic ulcer of other part of left foot with unspecified severity: Secondary | ICD-10-CM | POA: Diagnosis not present

## 2024-09-22 DIAGNOSIS — L97529 Non-pressure chronic ulcer of other part of left foot with unspecified severity: Secondary | ICD-10-CM | POA: Diagnosis not present

## 2024-09-27 DIAGNOSIS — L97529 Non-pressure chronic ulcer of other part of left foot with unspecified severity: Secondary | ICD-10-CM | POA: Diagnosis not present

## 2024-10-05 ENCOUNTER — Encounter: Attending: Internal Medicine | Admitting: Internal Medicine

## 2024-10-05 DIAGNOSIS — M01X72 Direct infection of left ankle and foot in infectious and parasitic diseases classified elsewhere: Secondary | ICD-10-CM | POA: Diagnosis not present

## 2024-10-05 DIAGNOSIS — L97528 Non-pressure chronic ulcer of other part of left foot with other specified severity: Secondary | ICD-10-CM | POA: Diagnosis not present

## 2024-10-05 DIAGNOSIS — E11621 Type 2 diabetes mellitus with foot ulcer: Secondary | ICD-10-CM | POA: Diagnosis not present

## 2024-10-05 DIAGNOSIS — E1122 Type 2 diabetes mellitus with diabetic chronic kidney disease: Secondary | ICD-10-CM | POA: Diagnosis not present

## 2024-10-05 DIAGNOSIS — I129 Hypertensive chronic kidney disease with stage 1 through stage 4 chronic kidney disease, or unspecified chronic kidney disease: Secondary | ICD-10-CM | POA: Insufficient documentation

## 2024-10-05 DIAGNOSIS — Z794 Long term (current) use of insulin: Secondary | ICD-10-CM | POA: Insufficient documentation

## 2024-10-05 DIAGNOSIS — M21372 Foot drop, left foot: Secondary | ICD-10-CM | POA: Insufficient documentation

## 2024-10-05 DIAGNOSIS — N184 Chronic kidney disease, stage 4 (severe): Secondary | ICD-10-CM | POA: Diagnosis not present

## 2024-10-10 ENCOUNTER — Ambulatory Visit: Admitting: Podiatry

## 2024-10-16 ENCOUNTER — Encounter: Admitting: Physician Assistant

## 2024-10-16 DIAGNOSIS — E11621 Type 2 diabetes mellitus with foot ulcer: Secondary | ICD-10-CM | POA: Diagnosis not present

## 2024-10-23 ENCOUNTER — Inpatient Hospital Stay
Admission: EM | Admit: 2024-10-23 | Discharge: 2024-10-25 | Disposition: A | Discharge status: Home / Self Care | Source: Home / Self Care | Attending: Internal Medicine | Admitting: Internal Medicine

## 2024-10-23 ENCOUNTER — Emergency Department

## 2024-10-23 ENCOUNTER — Other Ambulatory Visit: Payer: Self-pay

## 2024-10-23 ENCOUNTER — Encounter: Payer: Self-pay | Admitting: *Deleted

## 2024-10-23 DIAGNOSIS — E785 Hyperlipidemia, unspecified: Secondary | ICD-10-CM | POA: Diagnosis present

## 2024-10-23 DIAGNOSIS — N189 Chronic kidney disease, unspecified: Secondary | ICD-10-CM

## 2024-10-23 DIAGNOSIS — Z794 Long term (current) use of insulin: Secondary | ICD-10-CM

## 2024-10-23 DIAGNOSIS — Z823 Family history of stroke: Secondary | ICD-10-CM

## 2024-10-23 DIAGNOSIS — Z79899 Other long term (current) drug therapy: Secondary | ICD-10-CM

## 2024-10-23 DIAGNOSIS — R1110 Vomiting, unspecified: Secondary | ICD-10-CM | POA: Diagnosis present

## 2024-10-23 DIAGNOSIS — Z9981 Dependence on supplemental oxygen: Secondary | ICD-10-CM

## 2024-10-23 DIAGNOSIS — J441 Chronic obstructive pulmonary disease with (acute) exacerbation: Principal | ICD-10-CM | POA: Diagnosis present

## 2024-10-23 DIAGNOSIS — I129 Hypertensive chronic kidney disease with stage 1 through stage 4 chronic kidney disease, or unspecified chronic kidney disease: Secondary | ICD-10-CM | POA: Diagnosis present

## 2024-10-23 DIAGNOSIS — N1832 Chronic kidney disease, stage 3b: Secondary | ICD-10-CM | POA: Diagnosis present

## 2024-10-23 DIAGNOSIS — N179 Acute kidney failure, unspecified: Secondary | ICD-10-CM | POA: Diagnosis present

## 2024-10-23 DIAGNOSIS — R000 Tachycardia, unspecified: Secondary | ICD-10-CM | POA: Diagnosis present

## 2024-10-23 DIAGNOSIS — J9601 Acute respiratory failure with hypoxia: Principal | ICD-10-CM | POA: Diagnosis present

## 2024-10-23 DIAGNOSIS — Z8249 Family history of ischemic heart disease and other diseases of the circulatory system: Secondary | ICD-10-CM

## 2024-10-23 DIAGNOSIS — R6889 Other general symptoms and signs: Secondary | ICD-10-CM

## 2024-10-23 DIAGNOSIS — I160 Hypertensive urgency: Secondary | ICD-10-CM | POA: Diagnosis present

## 2024-10-23 DIAGNOSIS — Z1152 Encounter for screening for COVID-19: Secondary | ICD-10-CM

## 2024-10-23 DIAGNOSIS — E1122 Type 2 diabetes mellitus with diabetic chronic kidney disease: Secondary | ICD-10-CM | POA: Diagnosis present

## 2024-10-23 DIAGNOSIS — R0602 Shortness of breath: Secondary | ICD-10-CM

## 2024-10-23 DIAGNOSIS — E1129 Type 2 diabetes mellitus with other diabetic kidney complication: Secondary | ICD-10-CM | POA: Diagnosis present

## 2024-10-23 LAB — COMPREHENSIVE METABOLIC PANEL WITH GFR
ALT: 17 U/L (ref 0–44)
AST: 22 U/L (ref 15–41)
Albumin: 4.1 g/dL (ref 3.5–5.0)
Alkaline Phosphatase: 106 U/L (ref 38–126)
Anion gap: 13 (ref 5–15)
BUN: 40 mg/dL — ABNORMAL HIGH (ref 6–20)
CO2: 24 mmol/L (ref 22–32)
Calcium: 9.6 mg/dL (ref 8.9–10.3)
Chloride: 101 mmol/L (ref 98–111)
Creatinine, Ser: 3.02 mg/dL — ABNORMAL HIGH (ref 0.61–1.24)
GFR, Estimated: 24 mL/min — ABNORMAL LOW
Glucose, Bld: 206 mg/dL — ABNORMAL HIGH (ref 70–99)
Potassium: 4.1 mmol/L (ref 3.5–5.1)
Sodium: 138 mmol/L (ref 135–145)
Total Bilirubin: 0.9 mg/dL (ref 0.0–1.2)
Total Protein: 7.6 g/dL (ref 6.5–8.1)

## 2024-10-23 LAB — CBC
HCT: 43.3 % (ref 39.0–52.0)
Hemoglobin: 14.7 g/dL (ref 13.0–17.0)
MCH: 30.6 pg (ref 26.0–34.0)
MCHC: 33.9 g/dL (ref 30.0–36.0)
MCV: 90 fL (ref 80.0–100.0)
Platelets: 274 K/uL (ref 150–400)
RBC: 4.81 MIL/uL (ref 4.22–5.81)
RDW: 12.9 % (ref 11.5–15.5)
WBC: 9.9 K/uL (ref 4.0–10.5)
nRBC: 0 % (ref 0.0–0.2)

## 2024-10-23 MED ORDER — IPRATROPIUM-ALBUTEROL 0.5-2.5 (3) MG/3ML IN SOLN
3.0000 mL | Freq: Once | RESPIRATORY_TRACT | Status: AC
  Administered 2024-10-23: 3 mL via RESPIRATORY_TRACT
  Filled 2024-10-23: qty 3

## 2024-10-23 MED ORDER — KETOROLAC TROMETHAMINE 15 MG/ML IJ SOLN
15.0000 mg | Freq: Once | INTRAMUSCULAR | Status: AC
  Administered 2024-10-23: 15 mg via INTRAVENOUS
  Filled 2024-10-23: qty 1

## 2024-10-23 MED ORDER — GUAIFENESIN-CODEINE 100-10 MG/5ML PO SOLN
5.0000 mL | Freq: Once | ORAL | Status: AC
  Administered 2024-10-23: 5 mL via ORAL
  Filled 2024-10-23: qty 5

## 2024-10-23 MED ORDER — SODIUM CHLORIDE 0.9 % IV BOLUS
500.0000 mL | Freq: Once | INTRAVENOUS | Status: AC
  Administered 2024-10-23: 500 mL via INTRAVENOUS

## 2024-10-23 MED ORDER — METHYLPREDNISOLONE SODIUM SUCC 125 MG IJ SOLR: 125.0000 mg | Freq: Once | INTRAMUSCULAR | Status: DC

## 2024-10-23 NOTE — ED Triage Notes (Signed)
 Pt reporting breathing problems for about 2 days. Cough for about 4 days, has been using home nebs. Actively vomiting in triage (states just started on arrival). Saturations 84% on RA, placed on 4 liters with sats 91-95%, denies fevers.

## 2024-10-23 NOTE — ED Provider Notes (Signed)
 "  [facility]    Event Date/Time   First MD Initiated Contact with Patient 10/23/24 2208     (approximate)   History   Shortness of Breath   HPI  Clayton [name] is a 54 y.o. male with history of diabetes and prior foot infection is presenting today with concern of shortness of breath.  Apparently last 4 days having cough congestion rhinorrhea, throughout the day today started noticing some increased trouble breathing.  He has a nebulizer at home which he has been using with increased frequency without any improvement causing him to present at this time.  No known sick contacts, he states that he has swelling in his extremities at times.  No fevers no chills.  He tells me no prior history of known coronary artery disease, reviewing his chart he has had elevated troponins before in the past but does not seem to have ever had a cath done.   Physical Exam   Triage Vital Signs: ED Triage Vitals  Encounter Vitals Group     BP 10/23/24 2133 (!) 195/116     Girls Systolic BP Percentile --      Girls Diastolic BP Percentile --      Boys Systolic BP Percentile --      Boys Diastolic BP Percentile --      Pulse Rate 10/23/24 2133 (!) 101     Resp 10/23/24 2133 (!) 24     Temp 10/23/24 2133 97.9 F (36.6 C)     Temp Source 10/23/24 2133 Oral     SpO2 10/23/24 2133 (!) 85 %     Weight --      Height --      Head Circumference --      Peak Flow --      Pain Score 10/23/24 2145 0     Pain Loc --      Pain Education --      Exclude from Growth Chart --     Most recent vital signs: Vitals:   10/23/24 2133 10/23/24 2146  BP: (!) 195/116   Pulse: (!) 101 (!) 107  Resp: (!) 24   Temp: 97.9 F (36.6 C)   SpO2: (!) 85% 93%     General: Awake, no distress.  CV:  Good peripheral perfusion.  No appreciated extremity swelling Resp:  Normal effort.  Able to speak full sentences frequently coughing in the exam room, lungs seem clear to auscultation  bilaterally Abd:  No distention.  Soft nontender Other:     ED Results / Procedures / Treatments   Labs (all labs ordered are listed, but only abnormal results are displayed) Labs Reviewed  COMPREHENSIVE METABOLIC PANEL WITH GFR - Abnormal; Notable for the following components:      Result Value   Glucose, Bld 206 (*)    BUN 40 (*)    Creatinine, Ser 3.02 (*)    GFR, Estimated 24 (*)    All other components within normal limits  RESP PANEL BY RT-PCR (RSV, FLU A&B, COVID)  RVPGX2  CBC  PRO BRAIN NATRIURETIC PEPTIDE  TROPONIN T, HIGH SENSITIVITY     EKG  Sinus rhythm with rate of about 105, axis of 90, intervals appear to be within normal limits, nonspecific T wave inversions appreciated in lead III and some questionable ST depressions in lead aVF however these seem to have been present in the prior EKG   RADIOLOGY No acute cardiopulmonary findings appreciated on my independent  interpretation of this chest x-ray  PROCEDURES:  Critical Care performed: No  Procedures   MEDICATIONS ORDERED IN ED: Medications  ipratropium-albuterol  (DUONEB) 0.5-2.5 (3) MG/3ML nebulizer solution 3 mL (has no administration in time range)  ketorolac  (TORADOL ) 15 MG/ML injection 15 mg (has no administration in time range)  guaiFENesin -codeine  100-10 MG/5ML solution 5 mL (has no administration in time range)  sodium chloride  0.9 % bolus 500 mL (has no administration in time range)     IMPRESSION / MDM / ASSESSMENT AND PLAN / ED COURSE  I reviewed the triage vital signs and the nursing notes.                               Patient's presentation is most consistent with acute presentation with potential threat to life or bodily function.  54 year old male history of diabetes presenting today with concern of cough shortness of breath new onset oxygen requirement.  Requiring about 4 L of nasal cannula for appropriate oxygenation.  He is able to speak in full sentences and appears overall well  he is slightly tachycardic here.  Lab work demonstrating having developed an AKI although he does have CKD at baseline.  Clinically I feel PE is unlikely, and most likely secondary to viral URI given the clinical presentation.  Will follow-up labs possibly symptoms in combination with COPD exacerbation, anticipate admission.       FINAL CLINICAL IMPRESSION(S) / ED DIAGNOSES   Final diagnoses:  SOB (shortness of breath)     Rx / DC Orders   ED Discharge Orders     None        Note:  This document was prepared using Dragon voice recognition software and may include unintentional dictation errors.   Fernand Rossie HERO, MD 10/23/24 2248  "

## 2024-10-24 ENCOUNTER — Ambulatory Visit: Admitting: Podiatry

## 2024-10-24 DIAGNOSIS — Z794 Long term (current) use of insulin: Secondary | ICD-10-CM | POA: Diagnosis not present

## 2024-10-24 DIAGNOSIS — Z9981 Dependence on supplemental oxygen: Secondary | ICD-10-CM | POA: Diagnosis not present

## 2024-10-24 DIAGNOSIS — J441 Chronic obstructive pulmonary disease with (acute) exacerbation: Secondary | ICD-10-CM

## 2024-10-24 DIAGNOSIS — R0602 Shortness of breath: Secondary | ICD-10-CM | POA: Diagnosis present

## 2024-10-24 DIAGNOSIS — I160 Hypertensive urgency: Secondary | ICD-10-CM | POA: Diagnosis present

## 2024-10-24 DIAGNOSIS — N1832 Chronic kidney disease, stage 3b: Secondary | ICD-10-CM | POA: Diagnosis present

## 2024-10-24 DIAGNOSIS — J9601 Acute respiratory failure with hypoxia: Secondary | ICD-10-CM | POA: Diagnosis present

## 2024-10-24 DIAGNOSIS — R000 Tachycardia, unspecified: Secondary | ICD-10-CM | POA: Diagnosis present

## 2024-10-24 DIAGNOSIS — Z1152 Encounter for screening for COVID-19: Secondary | ICD-10-CM | POA: Diagnosis not present

## 2024-10-24 DIAGNOSIS — E1122 Type 2 diabetes mellitus with diabetic chronic kidney disease: Secondary | ICD-10-CM | POA: Diagnosis present

## 2024-10-24 DIAGNOSIS — Z8249 Family history of ischemic heart disease and other diseases of the circulatory system: Secondary | ICD-10-CM | POA: Diagnosis not present

## 2024-10-24 DIAGNOSIS — R1110 Vomiting, unspecified: Secondary | ICD-10-CM | POA: Diagnosis present

## 2024-10-24 DIAGNOSIS — E785 Hyperlipidemia, unspecified: Secondary | ICD-10-CM | POA: Diagnosis present

## 2024-10-24 DIAGNOSIS — Z823 Family history of stroke: Secondary | ICD-10-CM | POA: Diagnosis not present

## 2024-10-24 DIAGNOSIS — Z79899 Other long term (current) drug therapy: Secondary | ICD-10-CM | POA: Diagnosis not present

## 2024-10-24 DIAGNOSIS — N179 Acute kidney failure, unspecified: Secondary | ICD-10-CM | POA: Diagnosis present

## 2024-10-24 DIAGNOSIS — I129 Hypertensive chronic kidney disease with stage 1 through stage 4 chronic kidney disease, or unspecified chronic kidney disease: Secondary | ICD-10-CM | POA: Diagnosis present

## 2024-10-24 LAB — RESP PANEL BY RT-PCR (RSV, FLU A&B, COVID)  RVPGX2
Influenza A by PCR: NEGATIVE
Influenza B by PCR: NEGATIVE
Resp Syncytial Virus by PCR: NEGATIVE
SARS Coronavirus 2 by RT PCR: NEGATIVE

## 2024-10-24 LAB — TROPONIN T, HIGH SENSITIVITY
Troponin T High Sensitivity: 33 ng/L — ABNORMAL HIGH (ref 0–19)
Troponin T High Sensitivity: 34 ng/L — ABNORMAL HIGH (ref 0–19)

## 2024-10-24 LAB — PRO BRAIN NATRIURETIC PEPTIDE: Pro Brain Natriuretic Peptide: 1061 pg/mL — ABNORMAL HIGH

## 2024-10-24 LAB — BASIC METABOLIC PANEL WITH GFR
Anion gap: 13 (ref 5–15)
BUN: 44 mg/dL — ABNORMAL HIGH (ref 6–20)
CO2: 22 mmol/L (ref 22–32)
Calcium: 9.2 mg/dL (ref 8.9–10.3)
Chloride: 101 mmol/L (ref 98–111)
Creatinine, Ser: 3 mg/dL — ABNORMAL HIGH (ref 0.61–1.24)
GFR, Estimated: 24 mL/min — ABNORMAL LOW
Glucose, Bld: 233 mg/dL — ABNORMAL HIGH (ref 70–99)
Potassium: 4.7 mmol/L (ref 3.5–5.1)
Sodium: 135 mmol/L (ref 135–145)

## 2024-10-24 LAB — D-DIMER, QUANTITATIVE: D-Dimer, Quant: 0.42 ug{FEU}/mL (ref 0.00–0.50)

## 2024-10-24 LAB — CBG MONITORING, ED
Glucose-Capillary: 218 mg/dL — ABNORMAL HIGH (ref 70–99)
Glucose-Capillary: 233 mg/dL — ABNORMAL HIGH (ref 70–99)

## 2024-10-24 LAB — GLUCOSE, CAPILLARY
Glucose-Capillary: 213 mg/dL — ABNORMAL HIGH (ref 70–99)
Glucose-Capillary: 235 mg/dL — ABNORMAL HIGH (ref 70–99)

## 2024-10-24 MED ORDER — INSULIN ASPART PROT & ASPART (70-30 MIX) 100 UNIT/ML ~~LOC~~ SUSP
20.0000 [IU] | Freq: Two times a day (BID) | SUBCUTANEOUS | Status: DC
  Administered 2024-10-24 – 2024-10-25 (×2): 20 [IU] via SUBCUTANEOUS
  Filled 2024-10-24 (×2): qty 10

## 2024-10-24 MED ORDER — ONDANSETRON HCL 4 MG PO TABS: 4.0000 mg | ORAL_TABLET | Freq: Four times a day (QID) | ORAL | Status: DC | PRN

## 2024-10-24 MED ORDER — IPRATROPIUM-ALBUTEROL 0.5-2.5 (3) MG/3ML IN SOLN
3.0000 mL | Freq: Four times a day (QID) | RESPIRATORY_TRACT | Status: DC
  Administered 2024-10-24 – 2024-10-25 (×5): 3 mL via RESPIRATORY_TRACT
  Filled 2024-10-24 (×5): qty 3

## 2024-10-24 MED ORDER — AMLODIPINE BESYLATE 10 MG PO TABS
10.0000 mg | ORAL_TABLET | Freq: Every day | ORAL | Status: DC
  Administered 2024-10-24 – 2024-10-25 (×2): 10 mg via ORAL
  Filled 2024-10-24: qty 1
  Filled 2024-10-24: qty 2

## 2024-10-24 MED ORDER — METHYLPREDNISOLONE SODIUM SUCC 40 MG IJ SOLR
40.0000 mg | Freq: Two times a day (BID) | INTRAMUSCULAR | Status: AC
  Administered 2024-10-24 (×2): 40 mg via INTRAVENOUS
  Filled 2024-10-24 (×2): qty 1

## 2024-10-24 MED ORDER — ALBUTEROL SULFATE (2.5 MG/3ML) 0.083% IN NEBU: 2.5000 mg | INHALATION_SOLUTION | RESPIRATORY_TRACT | Status: DC | PRN

## 2024-10-24 MED ORDER — INSULIN ASPART 100 UNIT/ML IJ SOLN
0.0000 [IU] | Freq: Every day | INTRAMUSCULAR | Status: DC
  Administered 2024-10-24: 3 [IU] via SUBCUTANEOUS
  Filled 2024-10-24: qty 3

## 2024-10-24 MED ORDER — ACETAMINOPHEN 650 MG RE SUPP: 650.0000 mg | Freq: Four times a day (QID) | RECTAL | Status: DC | PRN

## 2024-10-24 MED ORDER — IPRATROPIUM-ALBUTEROL 0.5-2.5 (3) MG/3ML IN SOLN
3.0000 mL | Freq: Once | RESPIRATORY_TRACT | Status: AC
  Administered 2024-10-24: 3 mL via RESPIRATORY_TRACT
  Filled 2024-10-24: qty 3

## 2024-10-24 MED ORDER — HYDRALAZINE HCL 25 MG PO TABS
25.0000 mg | ORAL_TABLET | Freq: Four times a day (QID) | ORAL | Status: DC | PRN
  Administered 2024-10-24 – 2024-10-25 (×2): 25 mg via ORAL
  Filled 2024-10-24 (×2): qty 1

## 2024-10-24 MED ORDER — INSULIN LISPRO PROT & LISPRO (75-25 MIX) 100 UNIT/ML KWIKPEN: 20.0000 [IU] | PEN_INJECTOR | Freq: Two times a day (BID) | SUBCUTANEOUS | Status: DC

## 2024-10-24 MED ORDER — INSULIN ASPART 100 UNIT/ML IJ SOLN
0.0000 [IU] | Freq: Three times a day (TID) | INTRAMUSCULAR | Status: DC
  Administered 2024-10-24 – 2024-10-25 (×4): 5 [IU] via SUBCUTANEOUS
  Filled 2024-10-24 (×4): qty 5

## 2024-10-24 MED ORDER — ACETAMINOPHEN 325 MG PO TABS: 650.0000 mg | ORAL_TABLET | Freq: Four times a day (QID) | ORAL | Status: DC | PRN

## 2024-10-24 MED ORDER — PREDNISONE 20 MG PO TABS
40.0000 mg | ORAL_TABLET | Freq: Every day | ORAL | Status: DC
  Administered 2024-10-25: 40 mg via ORAL
  Filled 2024-10-24: qty 2

## 2024-10-24 MED ORDER — ONDANSETRON HCL 4 MG/2ML IJ SOLN: 4.0000 mg | Freq: Four times a day (QID) | INTRAMUSCULAR | Status: DC | PRN

## 2024-10-24 MED ORDER — LOSARTAN POTASSIUM 50 MG PO TABS: 100.0000 mg | ORAL_TABLET | Freq: Every day | ORAL | Status: DC

## 2024-10-24 MED ORDER — LOSARTAN POTASSIUM 50 MG PO TABS
100.0000 mg | ORAL_TABLET | Freq: Every day | ORAL | Status: DC
  Administered 2024-10-24 – 2024-10-25 (×2): 100 mg via ORAL
  Filled 2024-10-24 (×2): qty 2

## 2024-10-24 MED ORDER — GUAIFENESIN ER 600 MG PO TB12
600.0000 mg | ORAL_TABLET | Freq: Two times a day (BID) | ORAL | Status: DC
  Administered 2024-10-24 – 2024-10-25 (×3): 600 mg via ORAL
  Filled 2024-10-24 (×3): qty 1

## 2024-10-24 MED ORDER — HYDROCODONE-ACETAMINOPHEN 5-325 MG PO TABS: 1.0000 | ORAL_TABLET | ORAL | Status: DC | PRN

## 2024-10-24 MED ORDER — ENOXAPARIN SODIUM 60 MG/0.6ML IJ SOSY
60.0000 mg | PREFILLED_SYRINGE | INTRAMUSCULAR | Status: DC
  Administered 2024-10-24 – 2024-10-25 (×2): 60 mg via SUBCUTANEOUS
  Filled 2024-10-24 (×2): qty 0.6

## 2024-10-24 MED ORDER — GUAIFENESIN-DM 100-10 MG/5ML PO SYRP
5.0000 mL | ORAL_SOLUTION | ORAL | Status: DC | PRN
  Administered 2024-10-24 – 2024-10-25 (×4): 5 mL via ORAL
  Filled 2024-10-24 (×4): qty 10

## 2024-10-24 MED ORDER — METHYLPREDNISOLONE SODIUM SUCC 125 MG IJ SOLR
125.0000 mg | Freq: Once | INTRAMUSCULAR | Status: AC
  Administered 2024-10-24: 125 mg via INTRAVENOUS
  Filled 2024-10-24: qty 2

## 2024-10-24 MED ORDER — CARVEDILOL 25 MG PO TABS
25.0000 mg | ORAL_TABLET | Freq: Two times a day (BID) | ORAL | Status: DC
  Administered 2024-10-24 – 2024-10-25 (×3): 25 mg via ORAL
  Filled 2024-10-24 (×3): qty 1

## 2024-10-24 NOTE — Plan of Care (Signed)
" °  Problem: Nutritional: Goal: Maintenance of adequate nutrition will improve Outcome: Progressing Goal: Progress toward achieving an optimal weight will improve Outcome: Progressing   Problem: Health Behavior/Discharge Planning: Goal: Ability to identify and utilize available resources and services will improve Outcome: Progressing Goal: Ability to manage health-related needs will improve Outcome: Progressing   Problem: Coping: Goal: Ability to adjust to condition or change in health will improve Outcome: Progressing   Problem: Education: Goal: Ability to describe self-care measures that may prevent or decrease complications (Diabetes Survival Skills Education) will improve Outcome: Progressing Goal: Individualized Educational Video(s) Outcome: Progressing   "

## 2024-10-24 NOTE — H&P (Signed)
 " History and Physical    Patient: Clayton [name] FMW:[id number] DOB: December 23, 1970 DOA: 10/23/2024 DOS: the patient was seen and examined on 10/24/2024 PCP: Arden Fairy Lot, MD  Patient coming from: Home  Chief Complaint:  Chief Complaint  Patient presents with   Shortness of Breath    HPI: Clayton [name] is a 54 y.o. male with medical history significant for IDDM, CKD stage IIIa, HTN, HLD being admitted with COPD exacerbation with a new O2 requirement.  He presented with a 4-day history of cough and shortness of breath not improving with home nebulizers.  Upon arriving to the ED he started vomiting and was noted to have sats down to 84% on room air for which she was placed on 4 L improving to the mid 90s.  Vitals otherwise notable for BP 195/116 on arrival improving on its own to 158/87 by admission.  He was tachycardic to 101 and tachypneic to 24.  Afebrile. Labs notable for troponin 34 and proBNP 1061.  D-dimer negative at 0.42.  CBC0.0 normal.  CMP notable for creatinine of 3 2 up from baseline of 2.2.  Respiratory viral panel negative for COVID flu and RSV EKG showed sinus tachycardia at 106.  Chest x-ray nonacute Patient treated with DuoNebs Solu-Medrol  and also given IV fluids for AKI. Admission requested     Past Medical History:  Diagnosis Date   Asthma    CKD (chronic kidney disease), stage III (HCC)    Hypertension    Type II diabetes mellitus with renal manifestations [facility])    Past Surgical History:  Procedure Laterality Date   APPLICATION OF WOUND VAC Left 05/15/2024   Procedure: APPLICATION, WOUND VAC;  Surgeon: Malvin Marsa FALCON, DPM;  Location: [facility] ORS;  Service: Orthopedics/Podiatry;  Laterality: Left;   APPLICATION OF WOUND VAC Left 05/17/2024   Procedure: APPLICATION, WOUND VAC;  Surgeon: Malvin Marsa FALCON, DPM;  Location: [facility] OR[facility];  Service: Orthopedics/Podiatry;  Laterality: Left;   CATARACT EXTRACTION     GRAFT APPLICATION Left 05/17/2024    Procedure: GRAFT APPLICATION;  Surgeon: Malvin Marsa FALCON, DPM;  Location: [facility] ORS;  Service: Orthopedics/Podiatry;  Laterality: Left;   IRRIGATION AND DEBRIDEMENT ABSCESS Left 06/21/2024   Procedure: IRRIGATION AND DEBRIDEMENT ABSCESS;  Surgeon: Malvin Marsa FALCON, DPM;  Location: [facility] OR[facility];  Service: Orthopedics/Podiatry;  Laterality: Left;  Left Foot wound vac applied   IRRIGATION AND DEBRIDEMENT FOOT Left 05/15/2024   Procedure: IRRIGATION AND DEBRIDEMENT FOOT;  Surgeon: Malvin Marsa FALCON, DPM;  Location: [facility] OR[facility];  Service: Orthopedics/Podiatry;  Laterality: Left;  WITH BONE BIOPSY   IRRIGATION AND DEBRIDEMENT FOOT Left 05/17/2024   Procedure: IRRIGATION AND DEBRIDEMENT FOOT;  Surgeon: Malvin Marsa FALCON, DPM;  Location: [facility];  Service: Orthopedics/Podiatry;  Laterality: Left;   Social History:  reports that he has never smoked. He has never used smokeless tobacco. He reports current alcohol use. He reports that he does not currently use drugs.  Allergies[1]  Family History  Problem Relation Age of Onset   Stroke Mother    Hypertension Mother    Hypertension Father     Prior to Admission medications  Medication Sig Start Date End Date Taking? Authorizing Provider  albuterol  (VENTOLIN  HFA) 108 (90 Base) MCG/ACT inhaler Inhale 2 puffs into the lungs every 6 (six) hours as needed for wheezing or shortness of breath.   Yes [provider]  amLODipine  (NORVASC ) 10 MG tablet Take 1 tablet (10 mg total) by mouth daily. 06/24/24 10/24/24 Yes Patel, Sona, MD  ascorbic  acid (VITAMIN C ) 500 MG tablet Take 1 tablet (500 mg total) by mouth 2 (two) times daily. 06/24/24  Yes Patel, Sona, MD  carvedilol  (COREG ) 25 MG tablet Take 1 tablet (25 mg total) by mouth 2 (two) times daily. 06/24/24 10/24/24 Yes Patel, Sona, MD  fluticasone  furoate-vilanterol (BREO ELLIPTA ) 200-25 MCG/ACT AEPB Inhale 1 puff into the lungs daily.   Yes [provider]  Insulin  Lispro Prot & Lispro  (HUMALOG  75/25 MIX) (75-25) 100 UNIT/ML Kwikpen Inject 20 Units into the skin 2 (two) times daily with a meal. 06/24/24  Yes Tobie Calix, MD  losartan  (COZAAR ) 100 MG tablet Take 1 tablet (100 mg total) by mouth daily. 06/24/24 10/24/24 Yes Patel, Sona, MD  Multiple Vitamin (MULTIVITAMIN WITH MINERALS) TABS tablet Take 1 tablet by mouth daily. 06/25/24  Yes Tobie Calix, MD  Continuous Glucose Sensor (FREESTYLE LIBRE 3 SENSOR) MISC :1 Each Every 2 Weeks 12/02/23   [provider]  Ensure Max Protein (ENSURE MAX PROTEIN) LIQD Take 330 mLs (11 oz total) by mouth 2 (two) times daily. 06/24/24   Patel, Sona, MD  Insulin  Pen Needle 32G X 4 MM MISC Use as directed with insulin  pens. 06/24/24   Patel, Sona, MD  oxyCODONE -acetaminophen  (PERCOCET/ROXICET) 5-325 MG tablet Take 1-2 tablets by mouth every 6 (six) hours as needed for severe pain (pain score 7-10) or moderate pain (pain score 4-6). Patient not taking: Reported on 10/24/2024 06/24/24   Tobie Calix, MD    Physical Exam: Vitals:   10/23/24 2133 10/23/24 2146 10/23/24 2330 10/24/24 0000  BP: (!) 195/116  (!) 168/93 (!) 158/87  Pulse: (!) 101 (!) 107 88 73  Resp: (!) 24  13   Temp: 97.9 F (36.6 C)     TempSrc: Oral     SpO2: (!) 85% 93% 100% 99%   Physical Exam Vitals and nursing note reviewed.  Constitutional:      General: He is not in acute distress.    Comments: Conversational dyspnea speaking in short sentences  HENT:     Head: Normocephalic and atraumatic.  Cardiovascular:     Rate and Rhythm: Regular rhythm. Tachycardia present.     Heart sounds: Normal heart sounds.  Pulmonary:     Effort: Tachypnea present.     Breath sounds: Wheezing and rhonchi present.  Abdominal:     Palpations: Abdomen is soft.     Tenderness: There is no abdominal tenderness.  Neurological:     Mental Status: Mental status is at baseline.     Labs on Admission: I have personally reviewed following labs and imaging studies  CBC: Recent Labs  Lab  10/23/24 2200  WBC 9.9  HGB 14.7  HCT 43.3  MCV 90.0  PLT 274   Basic Metabolic Panel: Recent Labs  Lab 10/23/24 2200  NA 138  K 4.1  CL 101  CO2 24  GLUCOSE 206*  BUN 40*  CREATININE 3.02*  CALCIUM  9.6   GFR: CrCl cannot be calculated (Unknown ideal weight.). Liver Function Tests: Recent Labs  Lab 10/23/24 2200  AST 22  ALT 17  ALKPHOS 106  BILITOT 0.9  PROT 7.6  ALBUMIN 4.1   No results for input(s): LIPASE, AMYLASE in the last 168 hours. No results for input(s): AMMONIA in the last 168 hours. Coagulation Profile: No results for input(s): INR, PROTIME in the last 168 hours. Cardiac Enzymes: No results for input(s): CKTOTAL, CKMB, CKMBINDEX, TROPONINI in the last 168 hours. BNP (last 3 results) Recent Labs  10/23/24 2200  PROBNP 1,061.0*   HbA1C: No results for input(s): HGBA1C in the last 72 hours. CBG: No results for input(s): GLUCAP in the last 168 hours. Lipid Profile: No results for input(s): CHOL, HDL, LDLCALC, TRIG, CHOLHDL, LDLDIRECT in the last 72 hours. Thyroid Function Tests: No results for input(s): TSH, T4TOTAL, FREET4, T3FREE, THYROIDAB in the last 72 hours. Anemia Panel: No results for input(s): VITAMINB12, FOLATE, FERRITIN, TIBC, IRON, RETICCTPCT in the last 72 hours. Urine analysis:    Component Value Date/Time   COLORURINE YELLOW (A) 05/15/2024 1410   APPEARANCEUR CLOUDY (A) 05/15/2024 1410   LABSPEC 1.020 05/15/2024 1410   PHURINE 5.0 05/15/2024 1410   GLUCOSEU >=500 (A) 05/15/2024 1410   HGBUR MODERATE (A) 05/15/2024 1410   BILIRUBINUR NEGATIVE 05/15/2024 1410   KETONESUR 5 (A) 05/15/2024 1410   PROTEINUR >=300 (A) 05/15/2024 1410   NITRITE NEGATIVE 05/15/2024 1410   LEUKOCYTESUR NEGATIVE 05/15/2024 1410    Radiological Exams on Admission: DG Chest Portable 1 View Result Date: 10/23/2024 EXAM: 1 VIEW(S) XRAY OF THE CHEST 10/23/2024 10:19:00 PM COMPARISON: 09/27/2023  CLINICAL HISTORY: SHOB FINDINGS: LUNGS AND PLEURA: No focal pulmonary opacity. No pleural effusion. No pneumothorax. HEART AND MEDIASTINUM: No acute abnormality of the cardiac and mediastinal silhouettes. BONES AND SOFT TISSUES: No acute osseous abnormality. IMPRESSION: 1. No acute process. Electronically signed by: Elsie Gravely MD 10/23/2024 10:21 PM EST RP Workstation: [id number]   Data Reviewed for HPI: Relevant notes from primary care and specialist visits, past discharge summaries as available in EHR, including Care Everywhere. Prior diagnostic testing as pertinent to current admission diagnoses Updated medications and problem lists for reconciliation ED course, including vitals, labs, imaging, treatment and response to treatment Triage notes, nursing and pharmacy notes and ED provider's notes Notable results as noted above in HPI      Assessment and Plan: * COPD with acute exacerbation (HCC) Acute respiratory failure with hypoxia Patient presents with cough wheezing O2 sats 84% on room air placed on 4 L DuoNebs, scheduled and as needed IV steroids Antitussives Wean off O2 as able  Acute renal failure superimposed on stage 3b chronic kidney disease (HCC) Received a 1 L bolus in the ED Monitor renal function and avoid nephrotoxins  Type II diabetes mellitus with renal manifestations (HCC) Sliding scale insulin  coverage  Hypertensive urgency BP 195/116 on arrival, likely related to increased work of breathing Resume home meds with PRNs as needed    DVT prophylaxis: Lovenox   Consults: none  Advance Care Planning:   Code Status: Prior   Family Communication: none  Disposition Plan: Back to previous home environment  Severity of Illness: The appropriate patient status for this patient is OBSERVATION. Observation status is judged to be reasonable and necessary in order to provide the required intensity of service to ensure the patient's safety. The patient's  presenting symptoms, physical exam findings, and initial radiographic and laboratory data in the context of their medical condition is felt to place them at decreased risk for further clinical deterioration. Furthermore, it is anticipated that the patient will be medically stable for discharge from the hospital within 2 midnights of admission.   Author: Delayne LULLA Solian, MD 10/24/2024 3:51 AM  For on call review [email or web address].      [1]  Allergies Allergen Reactions   No Known Allergies    "

## 2024-10-24 NOTE — ED Provider Notes (Signed)
 11:15 PM  Assumed care at shift change.  Patient here for shortness of breath with new oxygen requirement.  Chest x-ray clear.  Labs pending.  COVID and flu swab pending.  Patient will need admission.  May be COPD exacerbation.  Also has chronic kidney disease with mild AKI today.  Creatinine normally around 2 and creatinine today is 3.  Getting IVF.  3:20 AM  Pt's troponins minimally elevated but flat.  D-dimer negative.  Respiratory viral panel taking exceedingly long time to result.  Have contacted lab.  Will need to re-run.  Have spoken to Dr. Cleatus with hospitalist service for admission for COPD, flulike symptoms and new oxygen requirement.  CRITICAL CARE Performed by: Josette Sink   Total critical care time: 30 minutes  Critical care time was exclusive of separately billable procedures and treating other patients.  Critical care was necessary to treat or prevent imminent or life-threatening deterioration.  Critical care was time spent personally by me on the following activities: development of treatment plan with patient and/or surrogate as well as nursing, discussions with consultants, evaluation of patient's response to treatment, examination of patient, obtaining history from patient or surrogate, ordering and performing treatments and interventions, ordering and review of laboratory studies, ordering and review of radiographic studies, pulse oximetry and re-evaluation of patient's condition.    Davona Kinoshita, Josette SAILOR, DO 10/24/24 [phone]

## 2024-10-24 NOTE — Assessment & Plan Note (Signed)
 Acute respiratory failure with hypoxia Patient presents with cough wheezing O2 sats 84% on room air placed on 4 L DuoNebs, scheduled and as needed IV steroids Antitussives Wean off O2 as able

## 2024-10-24 NOTE — Assessment & Plan Note (Signed)
 BP 195/116 on arrival, likely related to increased work of breathing Resume home meds with PRNs as needed

## 2024-10-24 NOTE — Assessment & Plan Note (Signed)
 Received a 1 L bolus in the ED Monitor renal function and avoid nephrotoxins

## 2024-10-24 NOTE — Progress Notes (Signed)
 PHARMACIST - PHYSICIAN COMMUNICATION  CONCERNING:  Enoxaparin  (Lovenox ) for DVT Prophylaxis    RECOMMENDATION: Patient was prescribed enoxaprin 40mg  q24 hours for VTE prophylaxis.   Filed Weights   10/24/24 0447  Weight: 119.3 kg (263 lb 0.1 oz)    Body mass index is 35.67 kg/m.  Estimated Creatinine Clearance: 38.2 mL/min (A) (by C-G formula based on SCr of 3.02 mg/dL (H)).   Based on [facility] policy patient is candidate for enoxaparin  0.5mg /kg TBW SQ every 24 hours based on BMI being >30.  DESCRIPTION: Pharmacy has adjusted enoxaparin  dose per [facility] policy.  Patient is now receiving enoxaparin  0.5 mg/kg every 24 hours   Rankin CANDIE Dills, PharmD, [facility] 10/24/2024 4:47 AM

## 2024-10-24 NOTE — Hospital Course (Signed)
 SABRA

## 2024-10-24 NOTE — Progress Notes (Addendum)
 Patient was seen and examined in the emergency department.  Interval events noted.  He has no complaints.  He said he feels better.  Breathing is a little better today.  Camilla, RN, was at the bedside.  BP still elevated with systolic BP in the 180s.  He is on 4 L oxygen via nasal cannula.  No acute abnormality on chest x-ray.  Clayton [name] is a 54 y.o. male with medical history significant for IDDM, CKD stage IIIa, HTN, HLD, who presented to the hospital with cough and shortness of breath and decreased oxygen saturations over 84% on room air.  He has been admitted to the hospital for COPD exacerbation and acute hypoxic respiratory failure.  Continue steroids and bronchodilators for COPD exacerbation.  Wean off oxygen as able.  Continue antihypertensives for hypertensive urgency.  He was given IV fluids for suspected AKI on CKD stage IIIa.  Repeat BMP  Continue insulin  for type II DM.

## 2024-10-24 NOTE — ED Notes (Signed)
 Pt called out due to coughing and wanted more medication for same. No PRN medications were noted and therefore floor coverage was contacted with regards to same.   Vitals were taken and are as noted. The pt's blood is noted to be elevated . He states he has HX of HTN. The pt states he took his morning medications but not his nighttime meds and believes his pressure his high due to same. Floor coverage MD notified of same.

## 2024-10-24 NOTE — Assessment & Plan Note (Signed)
 Sliding scale insulin  coverage

## 2024-10-25 ENCOUNTER — Other Ambulatory Visit: Payer: Self-pay

## 2024-10-25 LAB — GLUCOSE, CAPILLARY: Glucose-Capillary: 246 mg/dL — ABNORMAL HIGH (ref 70–99)

## 2024-10-25 LAB — BASIC METABOLIC PANEL WITH GFR
Anion gap: 11 (ref 5–15)
BUN: 54 mg/dL — ABNORMAL HIGH (ref 6–20)
CO2: 23 mmol/L (ref 22–32)
Calcium: 9.2 mg/dL (ref 8.9–10.3)
Chloride: 101 mmol/L (ref 98–111)
Creatinine, Ser: 3.07 mg/dL — ABNORMAL HIGH (ref 0.61–1.24)
GFR, Estimated: 24 mL/min — ABNORMAL LOW
Glucose, Bld: 243 mg/dL — ABNORMAL HIGH (ref 70–99)
Potassium: 5.1 mmol/L (ref 3.5–5.1)
Sodium: 135 mmol/L (ref 135–145)

## 2024-10-25 MED ORDER — FLUTICASONE FUROATE-VILANTEROL 200-25 MCG/ACT IN AEPB
1.0000 | INHALATION_SPRAY | Freq: Every day | RESPIRATORY_TRACT | 0 refills | Status: AC
  Filled 2024-10-25: qty 60, 30d supply, fill #0

## 2024-10-25 MED ORDER — AMLODIPINE BESYLATE 10 MG PO TABS
10.0000 mg | ORAL_TABLET | Freq: Every day | ORAL | 1 refills | Status: AC
  Filled 2024-10-25: qty 90, 90d supply, fill #0

## 2024-10-25 MED ORDER — IPRATROPIUM-ALBUTEROL 0.5-2.5 (3) MG/3ML IN SOLN: 3.0000 mL | Freq: Two times a day (BID) | RESPIRATORY_TRACT | Status: DC

## 2024-10-25 MED ORDER — LOSARTAN POTASSIUM 100 MG PO TABS
100.0000 mg | ORAL_TABLET | Freq: Every day | ORAL | 2 refills | Status: AC
  Filled 2024-10-25: qty 90, 90d supply, fill #0

## 2024-10-25 MED ORDER — PREDNISONE 20 MG PO TABS
40.0000 mg | ORAL_TABLET | Freq: Every day | ORAL | 0 refills | Status: AC
  Filled 2024-10-25: qty 8, 4d supply, fill #0

## 2024-10-25 NOTE — Discharge Summary (Signed)
 Physician Discharge Summary  Clayton [name] FMW:[id number] DOB: [birth year] DOA: 10/23/2024  PCP: Arden Fairy Lot, MD  Admit date: 10/23/2024 Discharge date: 10/25/2024  Admitted From: Home Disposition:  Home  Recommendations for Outpatient Follow-up:  Follow up with PCP in 1-2 weeks   Home Health:No  Equipment/Devices:None   Discharge Condition:Stable  CODE STATUS:FULL  Diet recommendation: Reg  Brief/Interim Summary:  54 y.o. male with medical history significant for IDDM, CKD stage IIIa, HTN, HLD being admitted with COPD exacerbation with a new O2 requirement.  He presented with a 4-day history of cough and shortness of breath not improving with home nebulizers.  Upon arriving to the ED he started vomiting and was noted to have sats down to 84% on room air for which she was placed on 4 L improving to the mid 90s.  Vitals otherwise notable for BP 195/116 on arrival improving on its own to 158/87 by admission.  He was tachycardic to 101 and tachypneic to 24.  Afebrile.   Patient required supplemental oxygen up to a maximum rate of 4 L.  Weaned to room air at time of discharge.  Ambulated without desaturation or difficulty.  Stable for discharge home.   Discharge Diagnoses:  Principal Problem:   COPD with acute exacerbation (HCC) Active Problems:   Acute respiratory failure with hypoxia (HCC)   Acute renal failure superimposed on stage 3b chronic kidney disease (HCC)   Hypertensive urgency   Type II diabetes mellitus with renal manifestations (HCC)   * COPD with acute exacerbation (HCC) Acute respiratory failure with hypoxia Patient presents with cough wheezing O2 sats 84% on room air placed on 4 L Weaned to room air at the time of discharge.  Clear breath sounds on auscultation without wheezing shortness of breath.  Ambulated without use of supplemental oxygen.  No desaturation or shortness of breath noted. Plan: P.o. prednisone  40 mg daily x 5 days Home  bronchodilators Outpatient PCP follow-up  Discharge Instructions  Discharge Instructions     Increase activity slowly   Complete by: As directed       Allergies as of 10/25/2024       Reactions   No Known Allergies         Medication List     STOP taking these medications    oxyCODONE -acetaminophen  5-325 MG tablet Commonly known as: PERCOCET/ROXICET       TAKE these medications    albuterol  108 (90 Base) MCG/ACT inhaler Commonly known as: VENTOLIN  HFA Inhale 2 puffs into the lungs every 6 (six) hours as needed for wheezing or shortness of breath.   amLODipine  10 MG tablet Commonly known as: NORVASC  Take 1 tablet (10 mg total) by mouth daily.   ascorbic acid  500 MG tablet Commonly known as: VITAMIN C  Take 1 tablet (500 mg total) by mouth 2 (two) times daily.   Breo Ellipta  200-25 MCG/ACT Aepb Generic drug: fluticasone  furoate-vilanterol Inhale 1 puff into the lungs daily.   carvedilol  25 MG tablet Commonly known as: COREG  Take 1 tablet (25 mg total) by mouth 2 (two) times daily.   CertaVite/Antioxidants Tabs Take 1 tablet by mouth daily.   Ensure Max Protein Liqd Take 330 mLs (11 oz total) by mouth 2 (two) times daily.   FreeStyle Libre 3 Sensor Misc :1 Each Every 2 Weeks   HumaLOG  Mix 75/25 KwikPen (75-25) 100 UNIT/ML KwikPen Generic drug: Insulin  Lispro Prot & Lispro Inject 20 Units into the skin 2 (two) times daily with a meal.   Insupen  Pen Needles 32G X 4 MM Misc Generic drug: Insulin  Pen Needle Use as directed with insulin  pens.   losartan  100 MG tablet Commonly known as: COZAAR  Take 1 tablet (100 mg total) by mouth daily.   predniSONE  20 MG tablet Commonly known as: DELTASONE  Take 2 tablets (40 mg total) by mouth daily with breakfast for 4 days. Start taking on: October 26, 2024        Allergies[1]  Consultations: None   Procedures/Studies: DG Chest Portable 1 View Result Date: 10/23/2024 EXAM: 1 VIEW(S) XRAY OF THE CHEST  10/23/2024 10:19:00 PM COMPARISON: 09/27/2023 CLINICAL HISTORY: SHOB FINDINGS: LUNGS AND PLEURA: No focal pulmonary opacity. No pleural effusion. No pneumothorax. HEART AND MEDIASTINUM: No acute abnormality of the cardiac and mediastinal silhouettes. BONES AND SOFT TISSUES: No acute osseous abnormality. IMPRESSION: 1. No acute process. Electronically signed by: Elsie Gravely MD 10/23/2024 10:21 PM EST RP Workstation: [id number]      Subjective: Seen and examined on the day of discharge.  Stable no distress.  Appropriate for discharge home.  Discharge Exam: Vitals:   10/25/24 0755 10/25/24 1100  BP: (!) 180/97 (!) 165/88  Pulse: 78   Resp: 16   Temp: 97.6 F (36.4 C)   SpO2: 98% 94%   Vitals:   10/25/24 0504 10/25/24 0746 10/25/24 0755 10/25/24 1100  BP: (!) 158/94  (!) 180/97 (!) 165/88  Pulse: 78  78   Resp: 20  16   Temp: 98.1 F (36.7 C)  97.6 F (36.4 C)   TempSrc:   Oral   SpO2: 96% 98% 98% 94%  Weight:      Height:        General: Pt is alert, awake, not in acute distress Cardiovascular: RRR, S1/S2 +, no rubs, no gallops Respiratory: CTA bilaterally, no wheezing, no rhonchi Abdominal: Soft, NT, ND, bowel sounds + Extremities: no edema, no cyanosis    The results of significant diagnostics from this hospitalization (including imaging, microbiology, ancillary and laboratory) are listed below for reference.     Microbiology: Recent Results (from the past 240 hours)  Resp panel by RT-PCR (RSV, Flu A&B, Covid) Anterior Nasal Swab     Status: None   Collection Time: 10/23/24 11:48 PM   Specimen: Anterior Nasal Swab  Result Value Ref Range Status   SARS Coronavirus 2 by RT PCR NEGATIVE NEGATIVE Final    Comment: (NOTE) SARS-CoV-2 target nucleic acids are NOT DETECTED.  The SARS-CoV-2 RNA is generally detectable in upper respiratory specimens during the acute phase of infection. The lowest concentration of SARS-CoV-2 viral copies this assay can detect is 138  copies/mL. A negative result does not preclude SARS-Cov-2 infection and should not be used as the sole basis for treatment or other patient management decisions. A negative result may occur with  improper specimen collection/handling, submission of specimen other than nasopharyngeal swab, presence of viral mutation(s) within the areas targeted by this assay, and inadequate number of viral copies(<138 copies/mL). A negative result must be combined with clinical observations, patient history, and epidemiological information. The expected result is Negative.  Fact Sheet for Patients:  bloggercourse.com  Fact Sheet for Healthcare Providers:  seriousbroker.it  This test is no t yet approved or cleared by the [place]  FDA and  has been authorized for detection and/or diagnosis of SARS-CoV-2 by FDA under an Emergency Use Authorization (EUA). This EUA will remain  in effect (meaning this test can be used) for the duration of the COVID-19 declaration under Section 564(b)(1) of  the Act, 21 U.S.C.section 360bbb-3(b)(1), unless the authorization is terminated  or revoked sooner.       Influenza A by PCR NEGATIVE NEGATIVE Final   Influenza B by PCR NEGATIVE NEGATIVE Final    Comment: (NOTE) The Xpert Xpress SARS-CoV-2/FLU/RSV plus assay is intended as an aid in the diagnosis of influenza from Nasopharyngeal swab specimens and should not be used as a sole basis for treatment. Nasal washings and aspirates are unacceptable for Xpert Xpress SARS-CoV-2/FLU/RSV testing.  Fact Sheet for Patients: bloggercourse.com  Fact Sheet for Healthcare Providers: seriousbroker.it  This test is not yet approved or cleared by the [place]  FDA and has been authorized for detection and/or diagnosis of SARS-CoV-2 by FDA under an Emergency Use Authorization (EUA). This EUA will remain in effect (meaning  this test can be used) for the duration of the COVID-19 declaration under Section 564(b)(1) of the Act, 21 U.S.C. section 360bbb-3(b)(1), unless the authorization is terminated or revoked.     Resp Syncytial Virus by PCR NEGATIVE NEGATIVE Final    Comment: (NOTE) Fact Sheet for Patients: bloggercourse.com  Fact Sheet for Healthcare Providers: seriousbroker.it  This test is not yet approved or cleared by the [place]  FDA and has been authorized for detection and/or diagnosis of SARS-CoV-2 by FDA under an Emergency Use Authorization (EUA). This EUA will remain in effect (meaning this test can be used) for the duration of the COVID-19 declaration under Section 564(b)(1) of the Act, 21 U.S.C. section 360bbb-3(b)(1), unless the authorization is terminated or revoked.  Performed at [facility], [address] [place], [place] [address]      Labs: BNP (last 3 results) No results for input(s): BNP in the last 8760 hours. Basic Metabolic Panel: Recent Labs  Lab 10/23/24 2200 10/24/24 1209 10/25/24 0521  NA 138 135 135  K 4.1 4.7 5.1  CL 101 101 101  CO2 24 22 23   GLUCOSE 206* 233* 243*  BUN 40* 44* 54*  CREATININE 3.02* 3.00* 3.07*  CALCIUM  9.6 9.2 9.2   Liver Function Tests: Recent Labs  Lab 10/23/24 2200  AST 22  ALT 17  ALKPHOS 106  BILITOT 0.9  PROT 7.6  ALBUMIN 4.1   No results for input(s): LIPASE, AMYLASE in the last 168 hours. No results for input(s): AMMONIA in the last 168 hours. CBC: Recent Labs  Lab 10/23/24 2200  WBC 9.9  HGB 14.7  HCT 43.3  MCV 90.0  PLT 274   Cardiac Enzymes: No results for input(s): CKTOTAL, CKMB, CKMBINDEX, TROPONINI in the last 168 hours. BNP: Invalid input(s): POCBNP CBG: Recent Labs  Lab 10/24/24 0748 10/24/24 1204 10/24/24 1749 10/24/24 2113 10/25/24 0752  GLUCAP 218* 233* 213* 235* 246*   D-Dimer Recent Labs     10/23/24 2200  DDIMER 0.42   Hgb A1c No results for input(s): HGBA1C in the last 72 hours. Lipid Profile No results for input(s): CHOL, HDL, LDLCALC, TRIG, CHOLHDL, LDLDIRECT in the last 72 hours. Thyroid function studies No results for input(s): TSH, T4TOTAL, T3FREE, THYROIDAB in the last 72 hours.  Invalid input(s): FREET3 Anemia work up No results for input(s): VITAMINB12, FOLATE, FERRITIN, TIBC, IRON, RETICCTPCT in the last 72 hours. Urinalysis    Component Value Date/Time   COLORURINE YELLOW (A) 05/15/2024 1410   APPEARANCEUR CLOUDY (A) 05/15/2024 1410   LABSPEC 1.020 05/15/2024 1410   PHURINE 5.0 05/15/2024 1410   GLUCOSEU >=500 (A) 05/15/2024 1410   HGBUR MODERATE (A) 05/15/2024 1410   BILIRUBINUR NEGATIVE 05/15/2024  1410   KETONESUR 5 (A) 05/15/2024 1410   PROTEINUR >=300 (A) 05/15/2024 1410   NITRITE NEGATIVE 05/15/2024 1410   LEUKOCYTESUR NEGATIVE 05/15/2024 1410   Sepsis Labs Recent Labs  Lab 10/23/24 2200  WBC 9.9   Microbiology Recent Results (from the past 240 hours)  Resp panel by RT-PCR (RSV, Flu A&B, Covid) Anterior Nasal Swab     Status: None   Collection Time: 10/23/24 11:48 PM   Specimen: Anterior Nasal Swab  Result Value Ref Range Status   SARS Coronavirus 2 by RT PCR NEGATIVE NEGATIVE Final    Comment: (NOTE) SARS-CoV-2 target nucleic acids are NOT DETECTED.  The SARS-CoV-2 RNA is generally detectable in upper respiratory specimens during the acute phase of infection. The lowest concentration of SARS-CoV-2 viral copies this assay can detect is 138 copies/mL. A negative result does not preclude SARS-Cov-2 infection and should not be used as the sole basis for treatment or other patient management decisions. A negative result may occur with  improper specimen collection/handling, submission of specimen other than nasopharyngeal swab, presence of viral mutation(s) within the areas targeted by this assay, and  inadequate number of viral copies(<138 copies/mL). A negative result must be combined with clinical observations, patient history, and epidemiological information. The expected result is Negative.  Fact Sheet for Patients:  bloggercourse.com  Fact Sheet for Healthcare Providers:  seriousbroker.it  This test is no t yet approved or cleared by the [place]  FDA and  has been authorized for detection and/or diagnosis of SARS-CoV-2 by FDA under an Emergency Use Authorization (EUA). This EUA will remain  in effect (meaning this test can be used) for the duration of the COVID-19 declaration under Section 564(b)(1) of the Act, 21 U.S.C.section 360bbb-3(b)(1), unless the authorization is terminated  or revoked sooner.       Influenza A by PCR NEGATIVE NEGATIVE Final   Influenza B by PCR NEGATIVE NEGATIVE Final    Comment: (NOTE) The Xpert Xpress SARS-CoV-2/FLU/RSV plus assay is intended as an aid in the diagnosis of influenza from Nasopharyngeal swab specimens and should not be used as a sole basis for treatment. Nasal washings and aspirates are unacceptable for Xpert Xpress SARS-CoV-2/FLU/RSV testing.  Fact Sheet for Patients: bloggercourse.com  Fact Sheet for Healthcare Providers: seriousbroker.it  This test is not yet approved or cleared by the [place]  FDA and has been authorized for detection and/or diagnosis of SARS-CoV-2 by FDA under an Emergency Use Authorization (EUA). This EUA will remain in effect (meaning this test can be used) for the duration of the COVID-19 declaration under Section 564(b)(1) of the Act, 21 U.S.C. section 360bbb-3(b)(1), unless the authorization is terminated or revoked.     Resp Syncytial Virus by PCR NEGATIVE NEGATIVE Final    Comment: (NOTE) Fact Sheet for Patients: bloggercourse.com  Fact Sheet for Healthcare  Providers: seriousbroker.it  This test is not yet approved or cleared by the [place]  FDA and has been authorized for detection and/or diagnosis of SARS-CoV-2 by FDA under an Emergency Use Authorization (EUA). This EUA will remain in effect (meaning this test can be used) for the duration of the COVID-19 declaration under Section 564(b)(1) of the Act, 21 U.S.C. section 360bbb-3(b)(1), unless the authorization is terminated or revoked.  Performed at [facility], [address], [place], [place] [address]      Time coordinating discharge: 40 minutes  SIGNED:   Calvin KATHEE Robson, MD  [facility] 10/25/2024, 1:40 PM Pager   If 7PM-7AM, please contact night-coverage     [  1]  Allergies Allergen Reactions   No Known Allergies

## 2024-10-25 NOTE — Progress Notes (Signed)
 Mobility Specialist - Progress Note  Pre-mobility: HR 88, BP During mobility: HR 111 Post-mobility: HR 90   10/25/24 1130  Mobility  Activity Ambulated independently  Level of Assistance Modified independent, requires aide device or extra time  Assistive Device  (knee scooter)  Distance Ambulated (ft) 320 ft  Activity Response Tolerated well  Mobility visit 1 Mobility  Mobility Specialist Start Time (ACUTE ONLY) 1116  Mobility Specialist Stop Time (ACUTE ONLY) 1122  Mobility Specialist Time Calculation (min) (ACUTE ONLY) 6 min   Nurse requested Mobility Specialist to perform oxygen saturation test with pt which includes removing pt from oxygen both at rest and while ambulating.  Below are the results from that testing.     Patient Saturations on Room Air at Rest = spO2 94%  Patient Saturations on Room Air while Ambulating = sp02 95-94%  At end of testing pt left in room on RA  Reported results to nurse.   Pt seated EOB upon entry, utilizing RA. Pt amb/scooted two laps around the NS ModI, denies fatigue or SOB. Pt returned to the room, left semi fowler with needs within reach.  Clayton [name] Mobility Specialist 10/25/2024 11:53 AM

## 2024-10-25 NOTE — TOC CM/SW Note (Signed)
 Transition of Care [facility]) - Inpatient Brief Assessment   Patient Details  Name: Clayton [name] MRN: [id number] Date of Birth: 12-Jul-1971  Transition of Care [facility]) CM/SW Contact:    Corean ONEIDA Haddock, RN Phone Number: 10/25/2024, 11:42 AM   Clinical Narrative:  Patient to dc today MD confirms that patient has ambulated on RA and does not require o2 for dc Patient is no longer active with [facility] Patient states that he has a final follow up appointment with wound care next week, and until he is cleared will be using his knee scooter Patient states wife will transport at discharge    Transition of Care Asessment: Insurance and Status: Insurance coverage has been reviewed Patient has primary care physician: Yes     Prior/Current Home Services: No current home services Social Drivers of Health Review: SDOH reviewed no interventions necessary Readmission risk has been reviewed: Yes Transition of care needs: no transition of care needs at this time.  30 Day Unplanned Readmission Risk Score    Flowsheet Row ED to Hosp-Admission (Current) from 10/23/2024 in [facility]  30 Day Unplanned Readmission Risk Score (%) 21.88 Filed at 10/25/2024 0801    This score is the patient's risk of an unplanned readmission within 30 days of being discharged (0 -100%). The score is based on dignosis, age, lab data, medications, orders, and past utilization.   Low:  0-14.9   Medium: 15-21.9   High: 22-29.9   Extreme: 30 and above            10/25/2024   11:40 AM  Readmission Risk Prevention Plan  Transportation Screening Complete  PCP or Specialist Appt within 5-7 Days Complete  Home Care Screening Complete  Medication Review (RN CM) Complete

## 2024-10-25 NOTE — Progress Notes (Signed)
 Discharge instructions were reviewed with patient. Questions were encouraged and answered. IV was removed. Belongings collected by patient.

## 2024-10-30 ENCOUNTER — Encounter: Attending: Physician Assistant | Admitting: Physician Assistant

## 2024-10-30 DIAGNOSIS — M21372 Foot drop, left foot: Secondary | ICD-10-CM | POA: Insufficient documentation

## 2024-10-30 DIAGNOSIS — L97528 Non-pressure chronic ulcer of other part of left foot with other specified severity: Secondary | ICD-10-CM | POA: Insufficient documentation

## 2024-10-30 DIAGNOSIS — M01X72 Direct infection of left ankle and foot in infectious and parasitic diseases classified elsewhere: Secondary | ICD-10-CM | POA: Insufficient documentation

## 2024-10-30 DIAGNOSIS — E11621 Type 2 diabetes mellitus with foot ulcer: Secondary | ICD-10-CM | POA: Insufficient documentation

## 2024-11-20 ENCOUNTER — Encounter: Admitting: Physician Assistant

## 2024-11-21 ENCOUNTER — Encounter: Admitting: Physician Assistant

## 2024-11-28 ENCOUNTER — Encounter: Admitting: Physician Assistant

## 2024-12-07 ENCOUNTER — Ambulatory Visit: Admitting: Podiatry
